# Patient Record
Sex: Female | Born: 1955
Health system: Southern US, Community
[De-identification: ages and names within clinical notes are randomized; demographics above are authoritative.]

## PROBLEM LIST (undated history)

## (undated) DIAGNOSIS — R200 Anesthesia of skin: Secondary | ICD-10-CM

## (undated) DIAGNOSIS — R531 Weakness: Secondary | ICD-10-CM

## (undated) DIAGNOSIS — R32 Unspecified urinary incontinence: Secondary | ICD-10-CM

## (undated) DIAGNOSIS — M545 Low back pain, unspecified: Secondary | ICD-10-CM

## (undated) DIAGNOSIS — G35 Multiple sclerosis: Secondary | ICD-10-CM

## (undated) DIAGNOSIS — H5440 Blindness, one eye, unspecified eye: Secondary | ICD-10-CM

## (undated) DIAGNOSIS — R0602 Shortness of breath: Secondary | ICD-10-CM

## (undated) DIAGNOSIS — E119 Type 2 diabetes mellitus without complications: Secondary | ICD-10-CM

## (undated) DIAGNOSIS — IMO0002 Reserved for concepts with insufficient information to code with codable children: Secondary | ICD-10-CM

## (undated) DIAGNOSIS — M1A9XX0 Chronic gout, unspecified, without tophus (tophi): Secondary | ICD-10-CM

## (undated) DIAGNOSIS — I10 Essential (primary) hypertension: Secondary | ICD-10-CM

## (undated) DIAGNOSIS — R0902 Hypoxemia: Secondary | ICD-10-CM

## (undated) DIAGNOSIS — Z960 Presence of urogenital implants: Secondary | ICD-10-CM

## (undated) DIAGNOSIS — I509 Heart failure, unspecified: Secondary | ICD-10-CM

## (undated) DIAGNOSIS — M109 Gout, unspecified: Secondary | ICD-10-CM

## (undated) DIAGNOSIS — M48061 Spinal stenosis, lumbar region without neurogenic claudication: Secondary | ICD-10-CM

## (undated) DIAGNOSIS — M4800 Spinal stenosis, site unspecified: Secondary | ICD-10-CM

## (undated) DIAGNOSIS — L89159 Pressure ulcer of sacral region, unspecified stage: Secondary | ICD-10-CM

## (undated) DIAGNOSIS — Z7409 Other reduced mobility: Secondary | ICD-10-CM

## (undated) DIAGNOSIS — G959 Disease of spinal cord, unspecified: Secondary | ICD-10-CM

## (undated) DIAGNOSIS — I872 Venous insufficiency (chronic) (peripheral): Secondary | ICD-10-CM

## (undated) DIAGNOSIS — M4802 Spinal stenosis, cervical region: Secondary | ICD-10-CM

## (undated) DIAGNOSIS — L03119 Cellulitis of unspecified part of limb: Secondary | ICD-10-CM

## (undated) DIAGNOSIS — Z7952 Long term (current) use of systemic steroids: Secondary | ICD-10-CM

## (undated) DIAGNOSIS — M47816 Spondylosis without myelopathy or radiculopathy, lumbar region: Secondary | ICD-10-CM

## (undated) DIAGNOSIS — R29898 Other symptoms and signs involving the musculoskeletal system: Secondary | ICD-10-CM

## (undated) HISTORY — DX: Blindness, one eye, unspecified eye: H54.40

## (undated) HISTORY — DX: Type 2 diabetes mellitus without complications: E11.9

## (undated) HISTORY — DX: Chronic gout, unspecified, without tophus (tophi): M1A.9XX0

## (undated) HISTORY — DX: Anesthesia of skin: R20.0

## (undated) HISTORY — DX: Other symptoms and signs involving the musculoskeletal system: R29.898

## (undated) HISTORY — DX: Spinal stenosis, site unspecified: M48.00

## (undated) HISTORY — DX: Pressure ulcer of sacral region, unspecified stage: L89.159

## (undated) HISTORY — DX: Long term (current) use of systemic steroids: Z79.52

## (undated) HISTORY — DX: Venous insufficiency (chronic) (peripheral): I87.2

## (undated) HISTORY — DX: Low back pain: M54.5

## (undated) HISTORY — DX: Other reduced mobility: Z74.09

## (undated) HISTORY — DX: Presence of urogenital implants: Z96.0

## (undated) HISTORY — DX: Low back pain, unspecified: M54.50

## (undated) HISTORY — DX: Multiple sclerosis: G35

## (undated) HISTORY — DX: Unspecified urinary incontinence: R32

## (undated) HISTORY — DX: Reserved for concepts with insufficient information to code with codable children: IMO0002

## (undated) HISTORY — DX: Cellulitis of unspecified part of limb: L03.119

## (undated) HISTORY — DX: Gout, unspecified: M10.9

## (undated) HISTORY — DX: Essential (primary) hypertension: I10

## (undated) HISTORY — DX: Weakness: R53.1

## (undated) HISTORY — DX: Spinal stenosis, cervical region: M48.02

## (undated) HISTORY — PX: CERVICAL DISC SURGERY: SHX588

## (undated) HISTORY — DX: Shortness of breath: R06.02

## (undated) HISTORY — DX: Disease of spinal cord, unspecified: G95.9

## (undated) HISTORY — DX: Spinal stenosis, lumbar region without neurogenic claudication: M48.061

## (undated) HISTORY — DX: Hypoxemia: R09.02

## (undated) HISTORY — PX: BACK SURGERY: SHX140

## (undated) HISTORY — DX: Spondylosis without myelopathy or radiculopathy, lumbar region: M47.816

## (undated) HISTORY — DX: Morbid (severe) obesity due to excess calories: E66.01

---

## 1999-05-08 ENCOUNTER — Emergency Department (HOSPITAL_COMMUNITY): Admission: EM | Admit: 1999-05-08 | Discharge: 1999-05-08 | Payer: Self-pay | Admitting: Emergency Medicine

## 2000-10-20 ENCOUNTER — Encounter: Admission: RE | Admit: 2000-10-20 | Discharge: 2000-10-20 | Payer: Self-pay | Admitting: Family Medicine

## 2000-10-20 ENCOUNTER — Encounter: Payer: Self-pay | Admitting: Family Medicine

## 2000-10-22 ENCOUNTER — Encounter: Admission: RE | Admit: 2000-10-22 | Discharge: 2000-10-22 | Payer: Self-pay | Admitting: Family Medicine

## 2000-10-22 ENCOUNTER — Encounter: Payer: Self-pay | Admitting: Family Medicine

## 2005-01-21 ENCOUNTER — Other Ambulatory Visit: Admission: RE | Admit: 2005-01-21 | Discharge: 2005-01-21 | Payer: Self-pay | Admitting: Family Medicine

## 2006-09-22 ENCOUNTER — Other Ambulatory Visit: Admission: RE | Admit: 2006-09-22 | Discharge: 2006-09-22 | Payer: Self-pay | Admitting: Family Medicine

## 2006-11-18 ENCOUNTER — Ambulatory Visit (HOSPITAL_COMMUNITY): Admission: RE | Admit: 2006-11-18 | Discharge: 2006-11-18 | Payer: Self-pay | Admitting: Family Medicine

## 2007-08-27 ENCOUNTER — Emergency Department (HOSPITAL_COMMUNITY): Admission: EM | Admit: 2007-08-27 | Discharge: 2007-08-27 | Payer: Self-pay | Admitting: Emergency Medicine

## 2008-05-31 ENCOUNTER — Other Ambulatory Visit: Admission: RE | Admit: 2008-05-31 | Discharge: 2008-05-31 | Payer: Self-pay | Admitting: Family Medicine

## 2008-11-10 ENCOUNTER — Ambulatory Visit (HOSPITAL_COMMUNITY): Admission: RE | Admit: 2008-11-10 | Discharge: 2008-11-10 | Payer: Self-pay | Admitting: Family Medicine

## 2009-05-15 ENCOUNTER — Ambulatory Visit (HOSPITAL_COMMUNITY): Admission: RE | Admit: 2009-05-15 | Discharge: 2009-05-15 | Payer: Self-pay | Admitting: Gastroenterology

## 2010-03-12 ENCOUNTER — Emergency Department (HOSPITAL_COMMUNITY)
Admission: EM | Admit: 2010-03-12 | Discharge: 2010-03-12 | Payer: Self-pay | Source: Home / Self Care | Admitting: Emergency Medicine

## 2010-05-23 ENCOUNTER — Ambulatory Visit (HOSPITAL_COMMUNITY)
Admission: RE | Admit: 2010-05-23 | Discharge: 2010-05-23 | Payer: Self-pay | Source: Home / Self Care | Attending: Neurology | Admitting: Neurology

## 2010-05-28 LAB — BUN: BUN: 17 mg/dL (ref 6–23)

## 2010-06-06 ENCOUNTER — Other Ambulatory Visit: Payer: Self-pay | Admitting: Neurology

## 2010-06-06 ENCOUNTER — Encounter: Payer: Self-pay | Admitting: Neurology

## 2010-06-06 DIAGNOSIS — G959 Disease of spinal cord, unspecified: Secondary | ICD-10-CM

## 2010-06-06 DIAGNOSIS — I10 Essential (primary) hypertension: Secondary | ICD-10-CM

## 2010-06-06 DIAGNOSIS — M4800 Spinal stenosis, site unspecified: Secondary | ICD-10-CM

## 2010-06-15 ENCOUNTER — Ambulatory Visit
Admission: RE | Admit: 2010-06-15 | Discharge: 2010-06-15 | Disposition: A | Discharge status: Home / Self Care | Payer: Commercial Managed Care - PPO | Source: Ambulatory Visit | Attending: Neurology | Admitting: Neurology

## 2010-06-15 DIAGNOSIS — G959 Disease of spinal cord, unspecified: Secondary | ICD-10-CM

## 2010-06-15 DIAGNOSIS — M4800 Spinal stenosis, site unspecified: Secondary | ICD-10-CM

## 2010-06-15 DIAGNOSIS — I10 Essential (primary) hypertension: Secondary | ICD-10-CM

## 2010-09-11 ENCOUNTER — Telehealth: Payer: Self-pay

## 2010-09-11 NOTE — Telephone Encounter (Signed)
Records received from [facility] gave to Lela  09/11/10/km

## 2010-09-13 ENCOUNTER — Ambulatory Visit: Payer: 59 | Attending: Neurology | Admitting: Physical Therapy

## 2010-09-13 ENCOUNTER — Other Ambulatory Visit: Payer: Self-pay | Admitting: Neurology

## 2010-09-13 DIAGNOSIS — IMO0001 Reserved for inherently not codable concepts without codable children: Secondary | ICD-10-CM | POA: Insufficient documentation

## 2010-09-13 DIAGNOSIS — M6281 Muscle weakness (generalized): Secondary | ICD-10-CM | POA: Insufficient documentation

## 2010-09-13 DIAGNOSIS — L02419 Cutaneous abscess of limb, unspecified: Secondary | ICD-10-CM

## 2010-09-13 DIAGNOSIS — R269 Unspecified abnormalities of gait and mobility: Secondary | ICD-10-CM | POA: Insufficient documentation

## 2010-09-14 ENCOUNTER — Other Ambulatory Visit: Payer: Self-pay | Admitting: *Deleted

## 2010-09-14 ENCOUNTER — Encounter (INDEPENDENT_AMBULATORY_CARE_PROVIDER_SITE_OTHER): Payer: 59 | Admitting: *Deleted

## 2010-09-14 ENCOUNTER — Other Ambulatory Visit (HOSPITAL_COMMUNITY): Payer: Self-pay | Admitting: Neurology

## 2010-09-14 DIAGNOSIS — M7989 Other specified soft tissue disorders: Secondary | ICD-10-CM

## 2010-09-14 DIAGNOSIS — G35 Multiple sclerosis: Secondary | ICD-10-CM

## 2010-09-14 DIAGNOSIS — L03119 Cellulitis of unspecified part of limb: Secondary | ICD-10-CM

## 2010-09-17 ENCOUNTER — Encounter: Payer: Self-pay | Admitting: Neurology

## 2010-09-19 ENCOUNTER — Ambulatory Visit (HOSPITAL_COMMUNITY)
Admission: RE | Admit: 2010-09-19 | Discharge: 2010-09-19 | Disposition: A | Discharge status: Home / Self Care | Payer: 59 | Source: Ambulatory Visit | Attending: Neurology | Admitting: Neurology

## 2010-09-19 DIAGNOSIS — M519 Unspecified thoracic, thoracolumbar and lumbosacral intervertebral disc disorder: Secondary | ICD-10-CM | POA: Insufficient documentation

## 2010-09-19 DIAGNOSIS — M48061 Spinal stenosis, lumbar region without neurogenic claudication: Secondary | ICD-10-CM | POA: Insufficient documentation

## 2010-09-19 DIAGNOSIS — G35 Multiple sclerosis: Secondary | ICD-10-CM

## 2010-09-19 MED ORDER — GADOBENATE DIMEGLUMINE 529 MG/ML IV SOLN
20.0000 mL | Freq: Once | INTRAVENOUS | Status: AC
  Administered 2010-09-19: 20 mL via INTRAVENOUS

## 2010-09-24 ENCOUNTER — Ambulatory Visit: Payer: 59 | Admitting: Physical Therapy

## 2010-09-26 ENCOUNTER — Ambulatory Visit: Payer: 59 | Admitting: Physical Therapy

## 2010-10-02 ENCOUNTER — Ambulatory Visit: Payer: 59 | Admitting: Physical Therapy

## 2010-10-04 ENCOUNTER — Ambulatory Visit: Payer: 59 | Admitting: Physical Therapy

## 2010-10-08 ENCOUNTER — Ambulatory Visit: Payer: 59 | Attending: Neurology | Admitting: Physical Therapy

## 2010-10-08 DIAGNOSIS — R269 Unspecified abnormalities of gait and mobility: Secondary | ICD-10-CM | POA: Insufficient documentation

## 2010-10-08 DIAGNOSIS — IMO0001 Reserved for inherently not codable concepts without codable children: Secondary | ICD-10-CM | POA: Insufficient documentation

## 2010-10-08 DIAGNOSIS — M6281 Muscle weakness (generalized): Secondary | ICD-10-CM | POA: Insufficient documentation

## 2010-10-10 ENCOUNTER — Ambulatory Visit: Payer: 59 | Admitting: Physical Therapy

## 2010-10-15 ENCOUNTER — Ambulatory Visit: Payer: 59 | Admitting: Physical Therapy

## 2010-10-17 ENCOUNTER — Ambulatory Visit: Payer: 59 | Admitting: Physical Therapy

## 2010-10-19 ENCOUNTER — Ambulatory Visit: Payer: 59 | Admitting: Physical Therapy

## 2010-10-22 ENCOUNTER — Ambulatory Visit: Payer: 59 | Admitting: Physical Therapy

## 2010-10-24 ENCOUNTER — Ambulatory Visit: Payer: 59 | Admitting: Physical Therapy

## 2010-11-23 ENCOUNTER — Ambulatory Visit: Payer: 59 | Admitting: Family Medicine

## 2010-12-07 ENCOUNTER — Ambulatory Visit (INDEPENDENT_AMBULATORY_CARE_PROVIDER_SITE_OTHER): Payer: 59 | Admitting: Family Medicine

## 2010-12-07 ENCOUNTER — Encounter: Payer: Self-pay | Admitting: Family Medicine

## 2010-12-07 VITALS — BP 126/82 | HR 82 | Wt 263.0 lb

## 2010-12-07 DIAGNOSIS — G35 Multiple sclerosis: Secondary | ICD-10-CM | POA: Insufficient documentation

## 2010-12-07 DIAGNOSIS — M1A9XX0 Chronic gout, unspecified, without tophus (tophi): Secondary | ICD-10-CM

## 2010-12-07 DIAGNOSIS — Z09 Encounter for follow-up examination after completed treatment for conditions other than malignant neoplasm: Secondary | ICD-10-CM

## 2010-12-07 DIAGNOSIS — H5440 Blindness, one eye, unspecified eye: Secondary | ICD-10-CM

## 2010-12-07 DIAGNOSIS — G35D Multiple sclerosis, unspecified: Secondary | ICD-10-CM

## 2010-12-07 DIAGNOSIS — M109 Gout, unspecified: Secondary | ICD-10-CM

## 2010-12-07 DIAGNOSIS — M48061 Spinal stenosis, lumbar region without neurogenic claudication: Secondary | ICD-10-CM

## 2010-12-07 DIAGNOSIS — N63 Unspecified lump in unspecified breast: Secondary | ICD-10-CM

## 2010-12-07 DIAGNOSIS — N950 Postmenopausal bleeding: Secondary | ICD-10-CM

## 2010-12-07 DIAGNOSIS — Z1239 Encounter for other screening for malignant neoplasm of breast: Secondary | ICD-10-CM

## 2010-12-07 DIAGNOSIS — I10 Essential (primary) hypertension: Secondary | ICD-10-CM

## 2010-12-07 DIAGNOSIS — E669 Obesity, unspecified: Secondary | ICD-10-CM

## 2010-12-07 DIAGNOSIS — N6320 Unspecified lump in the left breast, unspecified quadrant: Secondary | ICD-10-CM

## 2010-12-07 HISTORY — DX: Spinal stenosis, lumbar region without neurogenic claudication: M48.061

## 2010-12-07 HISTORY — DX: Morbid (severe) obesity due to excess calories: E66.01

## 2010-12-07 HISTORY — DX: Multiple sclerosis: G35

## 2010-12-07 HISTORY — DX: Blindness, one eye, unspecified eye: H54.40

## 2010-12-07 HISTORY — DX: Chronic gout, unspecified, without tophus (tophi): M1A.9XX0

## 2010-12-07 MED ORDER — DIAZEPAM 5 MG PO TABS: 2.5000 mg | ORAL_TABLET | Freq: Four times a day (QID) | ORAL | Status: DC | PRN

## 2010-12-07 NOTE — Assessment & Plan Note (Signed)
Result of MS flair 05/2010

## 2010-12-07 NOTE — Assessment & Plan Note (Signed)
No current symptoms and not on any meds

## 2010-12-07 NOTE — Assessment & Plan Note (Signed)
Well controled on current meds 

## 2010-12-07 NOTE — Assessment & Plan Note (Signed)
Last mammo 2002

## 2010-12-07 NOTE — Assessment & Plan Note (Signed)
Currently stable.  Managed by Dr. Sandria Manly

## 2010-12-07 NOTE — Patient Instructions (Signed)
Check when last tetanus Consider water aerobics for exercise. Get ultrasound - you may need more testing (endometrial biopsy) Get mammogram done.

## 2010-12-07 NOTE — Progress Notes (Signed)
  Subjective:    Patient ID: Barbara [name], female    DOB: [birth year], 55 y.o.   MRN: [id number]  HPI Establishing care: 2012 has been a bad year.  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  Now managed by Dr. Sandria Manly and stable.  Husband died suddenly (not expected but he did have MANY chronic medical problems.) in June 2012.     Seemed to go through menopause more than one year ago.  Has had two periods - both times when take steroids for flair of MS.   Colonoscopy 05/2008 normal Up to date on tetanus - receives at employee health Last Pap ~10/2009 history of abnormal pap but these were greater than 10 years ago.  All normal since.    Review of Systems Chronic Lt leg weakness.  Blind Rt eye.  No other complaints.    Objective:   Physical Exam VS noted General upbeat and handling grief well HEENT nl Lungs clear  Cardiac RRR       Assessment & Plan:

## 2010-12-07 NOTE — Assessment & Plan Note (Signed)
Most concerning acute problem.  Will check endometrial stripe on ultrasound.  Likely needs endometrial biopsy.

## 2010-12-11 NOTE — Progress Notes (Signed)
Addended by: Deno Etienne on: 12/11/2010 12:02 PM   Modules accepted: Orders

## 2010-12-27 ENCOUNTER — Telehealth: Payer: Self-pay | Admitting: Family Medicine

## 2010-12-27 NOTE — Telephone Encounter (Signed)
[name] needs to have the date of the last Tetanus shot, and that was 09/09/07.

## 2011-01-02 ENCOUNTER — Inpatient Hospital Stay (INDEPENDENT_AMBULATORY_CARE_PROVIDER_SITE_OTHER)
Admission: RE | Admit: 2011-01-02 | Discharge: 2011-01-02 | Disposition: A | Discharge status: Home / Self Care | Payer: 59 | Source: Ambulatory Visit | Attending: Emergency Medicine | Admitting: Emergency Medicine

## 2011-01-02 DIAGNOSIS — H109 Unspecified conjunctivitis: Secondary | ICD-10-CM

## 2011-01-03 ENCOUNTER — Ambulatory Visit: Payer: 59 | Admitting: Family Medicine

## 2011-02-12 ENCOUNTER — Other Ambulatory Visit: Payer: Self-pay | Admitting: Family Medicine

## 2011-02-12 DIAGNOSIS — I10 Essential (primary) hypertension: Secondary | ICD-10-CM

## 2011-02-12 MED ORDER — LISINOPRIL-HYDROCHLOROTHIAZIDE 10-12.5 MG PO TABS: 1.0000 | ORAL_TABLET | Freq: Every day | ORAL | Status: DC

## 2011-02-12 NOTE — Assessment & Plan Note (Signed)
Refilled based on fax request 

## 2011-03-11 ENCOUNTER — Ambulatory Visit (INDEPENDENT_AMBULATORY_CARE_PROVIDER_SITE_OTHER): Payer: Self-pay | Admitting: Pharmacist

## 2011-03-11 ENCOUNTER — Encounter: Payer: Self-pay | Admitting: Pharmacist

## 2011-03-11 VITALS — BP 127/84 | HR 77 | Ht 65.0 in | Wt 264.3 lb

## 2011-03-11 DIAGNOSIS — G35 Multiple sclerosis: Secondary | ICD-10-CM

## 2011-03-11 NOTE — Progress Notes (Signed)
  Subjective:    Patient ID: Barbara [name], female    DOB: Nov 06, 1955, 55 y.o.   MRN: [id number]  HPI - Patient arrived for med review.  Came in with assistance from rolling walker.  She appeared pleasant and expressed that it has been a tough year but she is confident in being able to handle things day to day.  Currently taking lisinopril/HCTZ 10-12.5 (blood pressure currently controlled on this), diazepam 5mg , and tramadol 50mg  (taking 1 a day which is working to control pain).  Patient also expressed that she is unsure of the source of the pain, either being from MS or spinal stenosis.  Plans to start Copaxone for MS pending November 30th appointment.  Patient previously tried on betaseron but expressed that it caused her leg to feel weaker.      Review of Systems     Objective:   Physical Exam  BP 127/84  Pulse 77  Ht 5\' 5"  (1.651 m)  Wt 264 lb 4.8 oz (119.886 kg)  BMI 43.98 kg/m2       Assessment & Plan:  No changes from previous, excepting plan to start Copaxone for MS pending November 30th appointment with Dr. Sandria Manly.  Patient seen with Marijo Conception, PharmD Candidate and Maudry Mayhew, Pharmacy Resident

## 2011-03-11 NOTE — Progress Notes (Signed)
  Subjective:    Patient ID: Barbara [name], female    DOB: 1956-02-22, 55 y.o.   MRN: [id number]  HPI  Reviewed and agree with Dr. Macky Lower management.    Review of Systems     Objective:   Physical Exam        Assessment & Plan:

## 2011-03-11 NOTE — Assessment & Plan Note (Signed)
No changes from previous, excepting plan to start Copaxone for MS pending November 30th appointment with Dr. Sandria Manly.  Patient seen with Marijo Conception, PharmD Candidate and Maudry Mayhew, Pharmacy Resident

## 2011-03-22 ENCOUNTER — Ambulatory Visit: Payer: 59 | Admitting: Family Medicine

## 2011-04-03 ENCOUNTER — Ambulatory Visit: Payer: 59 | Admitting: Family Medicine

## 2011-04-08 ENCOUNTER — Encounter: Payer: Self-pay | Admitting: Family Medicine

## 2011-04-08 NOTE — Progress Notes (Signed)
  Subjective:    Patient ID: Barbara [name], female    DOB: 1956/01/16, 55 y.o.   MRN: [id number]  HPI Neuro consult will be scanned.  States doing better on copaxone.   Review of Systems     Objective:   Physical Exam        Assessment & Plan:

## 2011-04-09 ENCOUNTER — Ambulatory Visit: Payer: 59 | Admitting: Family Medicine

## 2011-06-03 ENCOUNTER — Encounter: Payer: Self-pay | Admitting: Family Medicine

## 2011-06-03 DIAGNOSIS — M4802 Spinal stenosis, cervical region: Secondary | ICD-10-CM | POA: Insufficient documentation

## 2011-06-03 DIAGNOSIS — M48061 Spinal stenosis, lumbar region without neurogenic claudication: Secondary | ICD-10-CM

## 2011-06-03 HISTORY — DX: Spinal stenosis, cervical region: M48.02

## 2011-06-03 NOTE — Progress Notes (Signed)
  Subjective:    Patient ID: Barbara [name], female    DOB: [birth year], 56 y.o.   MRN: [id number]  HPI  Will scan consult note from [facility] who plans first cervical then lumbar spinal surg.  I will ask for case management services for this unfortunate young woman.      Review of Systems     Objective:   Physical Exam        Assessment & Plan:

## 2011-06-11 ENCOUNTER — Other Ambulatory Visit: Payer: Self-pay | Admitting: Family Medicine

## 2011-06-11 DIAGNOSIS — G35 Multiple sclerosis: Secondary | ICD-10-CM

## 2011-06-11 MED ORDER — DIAZEPAM 5 MG PO TABS: 2.5000 mg | ORAL_TABLET | Freq: Four times a day (QID) | ORAL | Status: DC | PRN

## 2011-06-11 NOTE — Telephone Encounter (Signed)
Refilled upon fax request 

## 2011-06-26 ENCOUNTER — Encounter: Payer: Self-pay | Admitting: Family Medicine

## 2011-08-07 DIAGNOSIS — G959 Disease of spinal cord, unspecified: Secondary | ICD-10-CM

## 2011-08-07 HISTORY — DX: Disease of spinal cord, unspecified: G95.9

## 2011-11-01 ENCOUNTER — Encounter: Payer: Self-pay | Admitting: Family Medicine

## 2011-11-01 ENCOUNTER — Ambulatory Visit (INDEPENDENT_AMBULATORY_CARE_PROVIDER_SITE_OTHER): Payer: 59 | Admitting: Family Medicine

## 2011-11-01 VITALS — BP 136/84 | HR 86 | Temp 97.9°F | Ht 65.0 in | Wt 257.9 lb

## 2011-11-01 DIAGNOSIS — M48061 Spinal stenosis, lumbar region without neurogenic claudication: Secondary | ICD-10-CM

## 2011-11-01 DIAGNOSIS — I10 Essential (primary) hypertension: Secondary | ICD-10-CM

## 2011-11-01 DIAGNOSIS — G35 Multiple sclerosis: Secondary | ICD-10-CM

## 2011-11-01 DIAGNOSIS — M4802 Spinal stenosis, cervical region: Secondary | ICD-10-CM

## 2011-11-01 DIAGNOSIS — E669 Obesity, unspecified: Secondary | ICD-10-CM

## 2011-11-01 LAB — LIPID PANEL
Cholesterol: 154 mg/dL (ref 0–200)
LDL Cholesterol: 75 mg/dL (ref 0–99)
Triglycerides: 248 mg/dL — ABNORMAL HIGH (ref <150)

## 2011-11-01 LAB — COMPLETE METABOLIC PANEL WITH GFR
BUN: 17 mg/dL (ref 6–23)
CO2: 28 mEq/L (ref 19–32)
Calcium: 9.3 mg/dL (ref 8.4–10.5)
Chloride: 105 mEq/L (ref 96–112)
Creat: 0.83 mg/dL (ref 0.50–1.10)
GFR, Est African American: >89 mL/min

## 2011-11-01 NOTE — Patient Instructions (Addendum)
You have chronic venous insufficiency of your leg.   Things you can do are elevate your leg during day, watch salt/sodium intake and keep the skin moist with Eucerin lotion. Watch your leg - it is easily infected.  If the redness starts getting larger, call me for antibiotics. I will call with results in 2 weeks.   Remember to get your mammogram. You will be due for a Pap smear next year.

## 2011-11-01 NOTE — Assessment & Plan Note (Signed)
Stable and follows closely with multiple readings.

## 2011-11-01 NOTE — Assessment & Plan Note (Signed)
Symptoms markedly improved since surgery.

## 2011-11-01 NOTE — Progress Notes (Signed)
  Subjective:    Patient ID: Barbara [name], female    DOB: 30-Sep-1955, 56 y.o.   MRN: [id number]  HPI  Multiple issues.  Chronic redness and swelling of lower Left leg - the one most affected by her multiple sclerosis.  Today is a typical day for her leg. Multiple sclerosis and neck surg Doing well since neck surgery.  Unable to exercise much due to leg weakness.  Tried water aerobics but felt unsafe getting in and out of pool.  Wants Vit D level - she has read that MS patients suffer more with low Vit D. Husband died about one year ago.  Coping well.   Behind on health maint.  Needs a mammogram. Pap due next year.  Needs lipids. Obese, but activity limited due to MS    Review of Systems     Objective:   Physical ExamNeck supple, well healing ant scar from neck surg Lungs clear Cardiac RRR without m or g Ext Lt leg 1+ edema and skin changes typical for chronic venous insufficiency.        Assessment & Plan:

## 2011-11-01 NOTE — Assessment & Plan Note (Signed)
Unchanged

## 2011-11-11 ENCOUNTER — Encounter: Payer: Self-pay | Admitting: Family Medicine

## 2011-11-27 ENCOUNTER — Other Ambulatory Visit (HOSPITAL_COMMUNITY): Payer: Self-pay | Admitting: Neurology

## 2011-11-27 DIAGNOSIS — G35D Multiple sclerosis, unspecified: Secondary | ICD-10-CM

## 2011-11-27 DIAGNOSIS — G35 Multiple sclerosis: Secondary | ICD-10-CM

## 2011-11-30 ENCOUNTER — Ambulatory Visit (HOSPITAL_COMMUNITY)
Admission: RE | Admit: 2011-11-30 | Discharge: 2011-11-30 | Disposition: A | Discharge status: Home / Self Care | Payer: 59 | Source: Ambulatory Visit | Attending: Neurology | Admitting: Neurology

## 2011-11-30 DIAGNOSIS — G35 Multiple sclerosis: Secondary | ICD-10-CM

## 2011-12-02 ENCOUNTER — Other Ambulatory Visit: Payer: Self-pay | Admitting: Family Medicine

## 2011-12-02 MED ORDER — COLCHICINE 0.6 MG PO TABS: 0.6000 mg | ORAL_TABLET | Freq: Every day | ORAL | Status: DC

## 2011-12-02 NOTE — Telephone Encounter (Signed)
Pt is having a gout flair up and ans is asking for Dr Leveda Anna to call in Colcrys 0.6mg   OP Pharm

## 2011-12-02 NOTE — Telephone Encounter (Signed)
Having gout flair.  Needs refill of colcrys

## 2011-12-02 NOTE — Telephone Encounter (Addendum)
Will forward to [name]. Spoke with patient  states left great toe is swollen , red and sore. Started Sat. She has not had a flare up since she has been seeing [name] but he knows she has a problem with gout  she states.

## 2012-01-01 ENCOUNTER — Other Ambulatory Visit: Payer: Self-pay | Admitting: Family Medicine

## 2012-01-01 DIAGNOSIS — G35 Multiple sclerosis: Secondary | ICD-10-CM

## 2012-01-01 MED ORDER — DIAZEPAM 5 MG PO TABS: 2.5000 mg | ORAL_TABLET | Freq: Four times a day (QID) | ORAL | Status: DC | PRN

## 2012-01-01 NOTE — Telephone Encounter (Signed)
Refilled based on fax request 

## 2012-02-06 ENCOUNTER — Telehealth: Payer: Self-pay | Admitting: Family Medicine

## 2012-02-06 DIAGNOSIS — I10 Essential (primary) hypertension: Secondary | ICD-10-CM

## 2012-02-06 NOTE — Telephone Encounter (Signed)
Pt is seeing an increase in her gout over the last few months and she had talked with her neurosurgeon yesterday to see what he thought about it.  He thinks that since she is putting more weight on her rt (good) leg that that might be the reason for this increase.  Wants to know if Dr Leveda Anna wants to put her back on the maintenance dose to help with this or to know of his opinion. Is at home today and tomorrow will be back at work

## 2012-02-06 NOTE — Telephone Encounter (Signed)
Will forward to Dr Hensel 

## 2012-02-07 NOTE — Telephone Encounter (Signed)
LVMOM for patient to call back.

## 2012-02-07 NOTE — Telephone Encounter (Signed)
Dear [facility] [employer] Plz tell then Dr Leveda Anna is out of office today and since this is med mgmt and not acute, I will leave for him to decide on Monday when he returns. Please forward back to his box THANKS! Denny Levy

## 2012-02-10 NOTE — Telephone Encounter (Signed)
Patient called back and gave message to her

## 2012-02-11 MED ORDER — LISINOPRIL 20 MG PO TABS: 20.0000 mg | ORAL_TABLET | Freq: Every day | ORAL | Status: DC

## 2012-02-11 NOTE — Assessment & Plan Note (Signed)
DC HCTZ and check uric acid

## 2012-02-11 NOTE — Addendum Note (Signed)
Addended by: Tivis Ringer on: 02/11/2012 02:00 PM   Modules accepted: Orders

## 2012-02-11 NOTE — Assessment & Plan Note (Signed)
DC HCTZ, increase lisinopril and check BMP

## 2012-03-13 ENCOUNTER — Ambulatory Visit (INDEPENDENT_AMBULATORY_CARE_PROVIDER_SITE_OTHER): Payer: Self-pay | Admitting: Pharmacist

## 2012-03-13 ENCOUNTER — Encounter: Payer: Self-pay | Admitting: Pharmacist

## 2012-03-13 VITALS — BP 139/85 | HR 73 | Ht 65.5 in | Wt 262.2 lb

## 2012-03-13 DIAGNOSIS — G35 Multiple sclerosis: Secondary | ICD-10-CM

## 2012-03-13 NOTE — Progress Notes (Signed)
  Subjective:    Patient ID: [name], female    DOB: 18-Jul-1955, 56 y.o.   MRN: [id number]  HPI HPI - Patient arrived for med review. Came in with assistance from rolling walker. Reports seeing [name] as primary care provider, Dr. Avie Echevaria as neurologist, Dr. Jacklyn Shell at [facility] as neurosurgeon.  Reports being diagnosed with MS Jan 2012. Patient states pending MRI of upper spine , anticipates changing therapy form Copaxone to Tysabri as her lower spine MRI showed progression of disease.   Review of Systems     Objective:   Physical Exam        Assessment & Plan:  Following medication review, no suggestions for change. Anticipate change to Tysabri per Dr.Love in near future. Complete medication list provided to patient.  Total time in face to face medication review: 10 minutes.  Patient seen with: Maryanna Shape PharmD, Pharmacy Resident and Dr. Marikay Alar MD

## 2012-03-13 NOTE — Patient Instructions (Addendum)
Thanks for coming in.

## 2012-03-13 NOTE — Assessment & Plan Note (Signed)
Following medication review, no suggestions for change. Anticipate change to Tysabri per Dr.Love in near future. Complete medication list provided to patient.  Total time in face to face medication review: 10 minutes.  Patient seen with: Maryanna Shape PharmD, Pharmacy Resident and Dr. Marikay Alar MD

## 2012-03-16 NOTE — Progress Notes (Signed)
Patient ID: Barbara [name], female   DOB: [birth year], 56 y.o.   MRN: [id number] Reviewed and agree with Dr. Macky Lower management and documentation.

## 2012-05-13 ENCOUNTER — Encounter: Payer: Self-pay | Admitting: Family Medicine

## 2012-05-13 ENCOUNTER — Ambulatory Visit (INDEPENDENT_AMBULATORY_CARE_PROVIDER_SITE_OTHER): Payer: 59 | Admitting: Family Medicine

## 2012-05-13 VITALS — BP 110/82 | HR 73 | Temp 98.0°F | Wt 262.0 lb

## 2012-05-13 DIAGNOSIS — M1A9XX0 Chronic gout, unspecified, without tophus (tophi): Secondary | ICD-10-CM

## 2012-05-13 DIAGNOSIS — M1A00X0 Idiopathic chronic gout, unspecified site, without tophus (tophi): Secondary | ICD-10-CM

## 2012-05-13 DIAGNOSIS — M25579 Pain in unspecified ankle and joints of unspecified foot: Secondary | ICD-10-CM

## 2012-05-13 DIAGNOSIS — E669 Obesity, unspecified: Secondary | ICD-10-CM

## 2012-05-13 LAB — URIC ACID: Uric Acid, Serum: 7.9 mg/dL — ABNORMAL HIGH (ref 2.4–7.0)

## 2012-05-13 NOTE — Patient Instructions (Signed)
As we discussed

## 2012-05-15 NOTE — Progress Notes (Signed)
  Subjective:    Patient ID: Barbara [name], female    DOB: [birth year], 56 y.o.   MRN: [id number]  HPI Doing well.  Main reason for visit was to discuss recurrent right ankle pain.  Happens on lateral side.  Gait abnormal and uses walker due to MS and cervical spine disease.  Also had questions about disability and about weight.    Review of Systems     Objective:   Physical Exam Ankle is normal on exam today.  When she has pain, it is on the lateral aspect of the ankle.  Checking her gait, she rolls her ankle internally putting undue stress on lateral ligaments.        Assessment & Plan:

## 2012-05-15 NOTE — Assessment & Plan Note (Signed)
Discussed possibility of bariatric surgery.  Significant wt reduction would help in her struggle to maintain her independence and mobility.

## 2012-05-15 NOTE — Assessment & Plan Note (Signed)
Unclear whether ankle pain is gout or mechanical.  Will check uric acid.  Treatment options are either allopurinol or referral to sports med for inserts.

## 2012-05-18 DIAGNOSIS — M25579 Pain in unspecified ankle and joints of unspecified foot: Secondary | ICD-10-CM | POA: Insufficient documentation

## 2012-05-18 NOTE — Assessment & Plan Note (Signed)
With uric acid of 7.9 doubt gout.  Will refer to sports med for possible inserts for her internal rotation of ankle.

## 2012-05-18 NOTE — Addendum Note (Signed)
Addended by: Tivis Ringer on: 05/18/2012 10:03 AM   Modules accepted: Orders

## 2012-06-30 ENCOUNTER — Other Ambulatory Visit: Payer: Self-pay | Admitting: Family Medicine

## 2012-06-30 DIAGNOSIS — G35 Multiple sclerosis: Secondary | ICD-10-CM

## 2012-06-30 MED ORDER — DIAZEPAM 5 MG PO TABS: 2.5000 mg | ORAL_TABLET | Freq: Four times a day (QID) | ORAL | Status: DC | PRN

## 2012-06-30 NOTE — Telephone Encounter (Signed)
Refilled diazepam in response to fax request and gave 3 month supply.

## 2012-07-01 ENCOUNTER — Telehealth: Payer: Self-pay | Admitting: *Deleted

## 2012-07-01 NOTE — Telephone Encounter (Signed)
Pharmacy calling about Barbara [name]'s diazepam prescription.  Patient was requesting a 90 day supply which would be #180 tablets and the prescription they received is just for #90 tablets which is a 45 day supply.  Just trying to verify if #90 tablets was all [name] wanted to prescribe at this time or did he mean to write the prescription for #180.  Will forward to [name] for review.  Ileana Ladd

## 2012-07-02 NOTE — Telephone Encounter (Signed)
Called pharmacy and let them know that Barbara [name] only uses 30 of this prn medication per month so the #90 is a 90 day supply.

## 2012-08-07 ENCOUNTER — Other Ambulatory Visit: Payer: Self-pay

## 2012-08-07 MED ORDER — TRAMADOL HCL 50 MG PO TABS: 50.0000 mg | ORAL_TABLET | Freq: Three times a day (TID) | ORAL | Status: DC | PRN

## 2012-08-07 NOTE — Telephone Encounter (Signed)
Patient requests 90 day rx.  Former [facility] patient.  Dr Eusebio Me

## 2012-08-18 ENCOUNTER — Other Ambulatory Visit (HOSPITAL_COMMUNITY): Payer: Self-pay | Admitting: Neurosurgery

## 2012-08-18 DIAGNOSIS — IMO0002 Reserved for concepts with insufficient information to code with codable children: Secondary | ICD-10-CM

## 2012-08-20 ENCOUNTER — Ambulatory Visit (HOSPITAL_COMMUNITY)
Admission: RE | Admit: 2012-08-20 | Discharge: 2012-08-20 | Disposition: A | Discharge status: Home / Self Care | Payer: 59 | Source: Ambulatory Visit | Attending: Neurosurgery | Admitting: Neurosurgery

## 2012-08-20 DIAGNOSIS — M51379 Other intervertebral disc degeneration, lumbosacral region without mention of lumbar back pain or lower extremity pain: Secondary | ICD-10-CM | POA: Insufficient documentation

## 2012-08-20 DIAGNOSIS — IMO0002 Reserved for concepts with insufficient information to code with codable children: Secondary | ICD-10-CM

## 2012-08-20 DIAGNOSIS — M5137 Other intervertebral disc degeneration, lumbosacral region: Secondary | ICD-10-CM | POA: Insufficient documentation

## 2012-08-20 DIAGNOSIS — G35 Multiple sclerosis: Secondary | ICD-10-CM | POA: Insufficient documentation

## 2012-08-20 DIAGNOSIS — M4120 Other idiopathic scoliosis, site unspecified: Secondary | ICD-10-CM | POA: Insufficient documentation

## 2012-08-20 DIAGNOSIS — M5126 Other intervertebral disc displacement, lumbar region: Secondary | ICD-10-CM | POA: Insufficient documentation

## 2012-10-16 ENCOUNTER — Institutional Professional Consult (permissible substitution): Payer: Self-pay | Admitting: Neurology

## 2012-10-20 ENCOUNTER — Other Ambulatory Visit: Payer: Self-pay | Admitting: Neurology

## 2012-10-20 ENCOUNTER — Telehealth: Payer: Self-pay | Admitting: Family Medicine

## 2012-10-20 NOTE — Telephone Encounter (Signed)
[facility] called and pt has not been reassigned. Message left for nurse.Requesting appointment as soon as possible. Wyatt Haste, RN-BSN

## 2012-10-20 NOTE — Telephone Encounter (Signed)
Call returned to pt - states that she cannot get here sooner due to lack of transportation. Encouraged to call and make earlier appointment if needed. I will call [facility] until I talk to someone regarding appointment and call her back. Pt verbalized understanding. Wyatt Haste, RN-BSN

## 2012-10-20 NOTE — Telephone Encounter (Signed)
Pt states that her neurologist has retired and she has been unable to get into see a new one. Pt reports that she has had MS flare up - and needs prescription for prednisone - has appointment here next Friday but needs to be seen sooner than that. Will attempt to get pt appointment at [facility] neuro or earlier here in the clinic than next Friday. Wyatt Haste, RN-BSN

## 2012-10-20 NOTE — Telephone Encounter (Signed)
Patient recently had back surgery and was in nursing home for 4 weeks. States back surgery triggered a MS episode. She feels she is almost over the MS episode and usually takes Prednisone at the end of episodes. Would like to know if Hensel would prescribe the Prednisone. Patient was a patient at [facility] (Dr. Sandria Manly) but cannot get a call back to get an appt with another neurologist. Pls call patient.

## 2012-10-21 ENCOUNTER — Telehealth: Payer: Self-pay | Admitting: *Deleted

## 2012-10-21 NOTE — Telephone Encounter (Signed)
If this patient had a sleep study and is needing a follow up, please schedule . If this is a new sleep consult  Without preceeding sleep study , please schedule through [facility] in the sleep lab with either of our sleep doctors. Marland Kitchen

## 2012-10-21 NOTE — Telephone Encounter (Signed)
After talking with Lanora Manis with Dr. Cyndia Skeeters office.   Pt had appt with Dr. Vickey Huger last Friday 10-16-12, had to cancel was in rehab after back surgery.  Has not seen Dr. Marjory Lies before.   Will send to Dr. Vickey Huger for sleep consult, but also see for MS exacerbation.  To call Lanora Manis back [phone].

## 2012-10-21 NOTE — Telephone Encounter (Signed)
Barbara [name] calling for pt who has been seen at [facility].   Pt having a MS exacerbation of her MS.  Former Dr. Sandria Manly pt.  Need appt. Last seen 07-02-12 with Dr. Sandria Manly, assigned to Dr. Marjory Lies.

## 2012-10-21 NOTE — Telephone Encounter (Signed)
[facility] returning call - will try to get pt in with Dr. Gerrianne Scale? And will return call after checking with MD. Wyatt Haste, RN-BSN

## 2012-10-21 NOTE — Telephone Encounter (Signed)
Former [facility] patient assigned to Dr Penumalli. 

## 2012-10-22 NOTE — Telephone Encounter (Signed)
This pt is assigned to you Dr. Marjory Lies . Please advise.

## 2012-10-22 NOTE — Telephone Encounter (Signed)
If this visit is for MS exacerbation, the patient s needs to see her primary neurologist and not sleep MD.

## 2012-10-23 NOTE — Telephone Encounter (Signed)
I called and LMVM for Lanora Manis, with Dr. Cyndia Diver office re: appt for Monday.   Will call pt.  I called pt and she cannot make Monday appt 10-26-12.  Just got out of rehab from back surgery.  She asked about Friday after seeing [name].   Dr.Penumalli out, no other MD available.  She would speak with [name] on Friday about what to do.  She stated her exacerbation started in hospital , in rehab.  She would be over it by the time she was able to get in for an appt.

## 2012-10-26 ENCOUNTER — Ambulatory Visit: Payer: Self-pay | Admitting: Diagnostic Neuroimaging

## 2012-10-27 NOTE — Telephone Encounter (Signed)
Called and verified that she does not need a prescription for prednisone at this time.

## 2012-10-28 ENCOUNTER — Telehealth: Payer: Self-pay | Admitting: Diagnostic Neuroimaging

## 2012-10-30 ENCOUNTER — Ambulatory Visit: Payer: 59 | Admitting: Family Medicine

## 2012-11-04 ENCOUNTER — Encounter: Payer: Self-pay | Admitting: Family Medicine

## 2012-11-04 ENCOUNTER — Ambulatory Visit (INDEPENDENT_AMBULATORY_CARE_PROVIDER_SITE_OTHER): Payer: 59 | Admitting: Family Medicine

## 2012-11-04 VITALS — BP 146/82 | HR 76 | Temp 97.8°F | Ht 64.0 in | Wt 255.6 lb

## 2012-11-04 DIAGNOSIS — M48061 Spinal stenosis, lumbar region without neurogenic claudication: Secondary | ICD-10-CM

## 2012-11-04 DIAGNOSIS — I10 Essential (primary) hypertension: Secondary | ICD-10-CM

## 2012-11-04 DIAGNOSIS — G35 Multiple sclerosis: Secondary | ICD-10-CM

## 2012-11-04 DIAGNOSIS — I872 Venous insufficiency (chronic) (peripheral): Secondary | ICD-10-CM

## 2012-11-04 MED ORDER — GABAPENTIN 300 MG PO CAPS: 300.0000 mg | ORAL_CAPSULE | Freq: Three times a day (TID) | ORAL | Status: DC

## 2012-11-04 MED ORDER — PREDNISONE 20 MG PO TABS: 20.0000 mg | ORAL_TABLET | Freq: Three times a day (TID) | ORAL | Status: DC | PRN

## 2012-11-04 MED ORDER — TRAMADOL HCL 50 MG PO TABS: 50.0000 mg | ORAL_TABLET | Freq: Four times a day (QID) | ORAL | Status: DC

## 2012-11-04 MED ORDER — COLCHICINE 0.6 MG PO TABS: 0.6000 mg | ORAL_TABLET | Freq: Every day | ORAL | Status: DC

## 2012-11-04 MED ORDER — DIAZEPAM 5 MG PO TABS: 5.0000 mg | ORAL_TABLET | Freq: Four times a day (QID) | ORAL | Status: DC | PRN

## 2012-11-04 NOTE — Patient Instructions (Addendum)
See me in 2-3 months for a pap smear and maybe an endometrial biopsy if the irregular bleeding continues. Keep an eye on your blood pressure.  You may need to go back on medications at some point. Low salt diet and elevate your legs for the swelling. Let me know about any paperwork you need completed.   All meds should be refilled with 3 month supply.

## 2012-11-04 NOTE — Assessment & Plan Note (Signed)
Regular gabapentin

## 2012-11-05 DIAGNOSIS — I872 Venous insufficiency (chronic) (peripheral): Secondary | ICD-10-CM | POA: Insufficient documentation

## 2012-11-05 HISTORY — DX: Venous insufficiency (chronic) (peripheral): I87.2

## 2012-11-05 NOTE — Progress Notes (Signed)
  Subjective:    Patient ID: Barbara [name], female    DOB: 1955/11/08, 57 y.o.   MRN: [id number]  HPI Wow, I have some catching up to do. She had C spine surgery at [facility].  Had significant weakness in both legs perhaps in part due to the surgery and likely a significant flair of her MS.  Had three weeks at [facility] for rehab. Feels like her leg strength is back to it's pre surg baseline.    Irregular menstrual bleeding.  Only has bleeding when on prednisone.  Bleeding now.  Also had menses in may. Before that, last menses was Sept 13.  Due for a pap  Due for mammo  Cannot return to her previous work.  At best can only sit/stand/work for 2 consecutive hours per her neurosurg and rehab docs.  Plans to apply for long term disability.  Given her weakness, I support this application.  Leg swelling.  Recurrent dependant edema.  Did not have in nursing home - but she was mostly in bed Reviewed labs.  Creat, albumin and LFTs normal.  Swelling is symetric so clot highly unlikely.  No hx of cardiac problems/CHF.  Does admit to some dietary sodium indiscretion.    Review of Systems     Objective:   Physical Exam Lungs clear Cardiac RRR with 2/6 SEM  Abd benign Ext symetric 2-3+ edema       Assessment & Plan:

## 2012-11-05 NOTE — Assessment & Plan Note (Signed)
Likely cause of her edema.  No diuretic for now.

## 2012-11-05 NOTE — Assessment & Plan Note (Signed)
Renewed meds.  She will see someone else at [facility] neuro now that [facility] retired.  She takes pred early for outbreak to avoid hospitalizations.

## 2012-11-05 NOTE — Assessment & Plan Note (Signed)
Taken off BP meds post op due to low BP.  Will monitor for now and restart prn.  Low salt diet should also help.

## 2012-11-15 ENCOUNTER — Encounter: Payer: Self-pay | Admitting: Family Medicine

## 2012-11-24 ENCOUNTER — Encounter: Payer: 59 | Admitting: Vascular Surgery

## 2012-12-24 ENCOUNTER — Encounter: Payer: Self-pay | Admitting: Family Medicine

## 2012-12-24 MED ORDER — METHOCARBAMOL 750 MG PO TABS: 750.0000 mg | ORAL_TABLET | Freq: Four times a day (QID) | ORAL | Status: DC

## 2012-12-30 ENCOUNTER — Ambulatory Visit: Payer: Self-pay | Admitting: Diagnostic Neuroimaging

## 2013-01-20 ENCOUNTER — Encounter: Payer: Self-pay | Admitting: Diagnostic Neuroimaging

## 2013-03-11 ENCOUNTER — Other Ambulatory Visit: Payer: Self-pay

## 2013-03-19 ENCOUNTER — Encounter: Payer: Self-pay | Admitting: Diagnostic Neuroimaging

## 2013-03-19 ENCOUNTER — Ambulatory Visit (INDEPENDENT_AMBULATORY_CARE_PROVIDER_SITE_OTHER): Payer: Self-pay | Admitting: Diagnostic Neuroimaging

## 2013-03-19 VITALS — BP 145/76 | HR 75 | Temp 98.5°F | Ht 64.0 in | Wt 259.5 lb

## 2013-03-19 DIAGNOSIS — G35 Multiple sclerosis: Secondary | ICD-10-CM

## 2013-03-19 NOTE — Progress Notes (Signed)
[facility]  PATIENT: Barbara [name] DOB: 1956-01-05  REFERRING CLINICIAN:  HISTORY FROM: patient REASON FOR VISIT: follow up   HISTORICAL  CHIEF COMPLAINT:  Chief Complaint  Patient presents with  . Follow-up    MS    HISTORY OF PRESENT ILLNESS:   UPDATE 03/19/13: Since last visit patient had lumbar decompression and fusion surgery at [facility] in May 2014. Following this she had significant difficulty walking and increased urinary incontinence. Patient followed up with her surgeon, who felt that the symptoms were more related to MS exacerbations rather than her of lumbar spine stenosis and surgery. Unfortunately patient does not have any insurance and is not able to afford her Copaxone. She has had injections left from when she was in the nursing home for rehabilitation, and now she is taking the injections every other day to stretch them out. She's in the process of applying for Medicare and disability, and has some lawyers working on her behalf to pursue this.  PRIOR HPI (2/276/14, Dr. Sandria Manly): 12 year old left-handed white widowed female with a 5 year history of  a tendency to fall forward while walking. Occasionally she will have retropulsion. She feels like "I'm going to fall". She had fallen numerous times and is uses  a walker. Falls assessment tool  score was 17. She has weakness in her left foot and leg more  than the right. She has  lower back pain and was seen by Dr. Newell Coral with MRI study of the lumbar spine without contrast 11/10/2008 showing spondylosis with  moderately severe central canal and lateral  recess stenosis of L4-5  and at L2-3 and L3-4. She has a three-year history of bladder symptoms with urinary incontinence. She does not have pain radiating into her legs. While walking she feels her legs begin to get weak and she falls forward. She has undergone epidural steroid injections by Dr. Alveda Reasons without benefit. She describes episodes in  which her legs "jump" and she has no control. She says her legs are like "thumper". This occurs when she is getting into the shower.She has  neck pain but none radiating into her arms. At times her left and right hand feel numb. At one point she was thought to have carpal tunnel syndrome.She denies Lhermitte's sign.05/16/2010 she noticed that her right eyelid was sore. 05/18/2010 she could not see in the right eye. She went to an optometrist. She was seen 05/23/2010 by Dr. Jethro Bolus and referred to [facility] who noted edema and a swollen disc on the right with NLP vision from optic neuritis. MRI study of the brain and cervical spine without and with contrast 05/23/10 showed focal T2 hyperintensity in the right optic nerve, one adjacent to the left lateral ventricle, and 3 discrete foci of T2 hyperintensity within the spinal cord. There was moderate central stenosis at C5-6 secondary to right paracentral disc protrusion and cord signal abnormality at C5-6 ,possibly secondary to the disc disease present. She began 1000 mg of IV Solu-Medrol daily for 3 days 05/25/2010 without improvement in NLP vision. Sjogren's antibodies, NMO IgG auto antibodies, ACE, ANA,sedimentation rate, B12, and Lyme titer were negative.CSF 06/15/2010 revealed 6 WBC, 0RBC, protein 66, glucose 85, VDRL NR, greater than 5 oligoclonal bands, CSF IgG 6.4 and normal ACE. She was on Betaseron 06/2010 and noticed the next day her legs didn't move as well. She was titrated  to full dose with flu like symptoms, leg cramps, episodes of crying, and decreased urination. She discontinued  Betaseron with much less falling and resolution of Flu symptoms.Cevical MRI without and with contrast 09/19/2010 showed increased cord signal in the  cervical spine at C6-7 suggestive of progressive disease and  other cord signal abnormality at C2-3 and C4 which was stable. MRI of the lumbar spine 09/19/2010 showed normal signal in the  medullaris and multifocal spondylosis  with right lateral recess and foraminal stenosis at L2-L3 and L3-4 and left greater than right lateral recess stenosis of L4- L5 She has severe lower back pain. Lumbar MRI 10/16/013 showed progressive disease. She started Copaxone 03/23/11 with improvement in symptoms. She has back pain down the left greater than right leg, worse with walking and activity.She underwent C5-6 C6-7 and C7-T1 diskectomy and fusion with plate and screws 06/14/2011 by  Dr. Timoteo Ace She has done well postoperatively, but no improvement in numbness in her hands and feet.   Dr. Timoteo Ace is treating her lumbar spinal stenosis with epidural steroids, acupuncture, and then consideration of surgery. She walked 10 feet without a walker and fell 02/24/12. At [facility] CT of the head and cervical spine and lumbarspine x-rays showed no acute intracranial abnormality, cervical fusion, and DJD in the lumbar spine. Back  pain increases with walking.  [facility]  OT felt she is disabled. Pain is 3-7/10 treated with tramadol and ibuprofen She has numbness left greater than right hand, dropping things.  REVIEW OF SYSTEMS: Full 14 system review of systems performed and notable only for numbness weakness joint pain joint swelling aching muscles incontinence fatigue.  ALLERGIES: No Known Allergies  HOME MEDICATIONS: Outpatient Prescriptions Prior to Visit  Medication Sig Dispense Refill  . aspirin 81 MG tablet Take 81 mg by mouth daily.        . calcium-vitamin D (OSCAL WITH D) 500-200 MG-UNIT per tablet Take 1 tablet by mouth daily.        . colchicine 0.6 MG tablet Take 1 tablet (0.6 mg total) by mouth daily.  90 tablet  3  . COPAXONE 20 MG/ML injection INJECT SUBCUTANEOUSLY DAILY  1 each  6  . diazepam (VALIUM) 5 MG tablet Take 1 tablet (5 mg total) by mouth every 6 (six) hours as needed (for leg spasms).  90 tablet  3  . fish oil-omega-3 fatty acids 1000 MG capsule Take 1 g by mouth daily.        Marland Kitchen ibuprofen (ADVIL,MOTRIN) 200 MG tablet Take  800 mg by mouth every 6 (six) hours as needed.      . methocarbamol (ROBAXIN) 750 MG tablet Take 1 tablet (750 mg total) by mouth 4 (four) times daily.  360 tablet  3  . Multiple Vitamin (MULTIVITAMIN) tablet Take 1 tablet by mouth daily.        . predniSONE (DELTASONE) 20 MG tablet Take 1 tablet (20 mg total) by mouth 3 (three) times daily as needed. For MS flair  45 tablet  3  . traMADol (ULTRAM) 50 MG tablet Take 1 tablet (50 mg total) by mouth 4 (four) times daily.  360 tablet  3  . gabapentin (NEURONTIN) 300 MG capsule Take 1 capsule (300 mg total) by mouth 3 (three) times daily.  270 capsule  3  . niacin 250 MG tablet Take 250 mg by mouth daily with breakfast.         No facility-administered medications prior to visit.    PAST MEDICAL HISTORY: Past Medical History  Diagnosis Date  . Lumbar spondylosis   . Spinal stenosis   .  Weakness   . Numbness   . Lower back pain   . Gout   . MS (multiple sclerosis)     PAST SURGICAL HISTORY: Past Surgical History  Procedure Laterality Date  . Cervical disc surgery      Qusetions C5-6 C6-7 and C7-T1    FAMILY HISTORY: Family History  Problem Relation Age of Onset  . Heart disease Mother   . Diabetes Mother   . Cancer Father     SOCIAL HISTORY:  History   Social History  . Marital Status: Widowed    Spouse Name: N/A    Number of Children: 0  . Years of Education: college   Occupational History  .     Social History Main Topics  . Smoking status: Never Smoker   . Smokeless tobacco: Never Used  . Alcohol Use: No  . Drug Use: No  . Sexual Activity: Not Currently     Comment: husband died 11-09-10   Other Topics Concern  . Not on file   Social History Narrative   Patient lives at home alone.   Caffeine Use: 1 cup daily     PHYSICAL EXAM  Filed Vitals:   03/19/13 0831  BP: 145/76  Pulse: 75  Temp: 98.5 F (36.9 C)  TempSrc: Oral  Height: 5\' 4"  (1.626 m)  Weight: 259 lb 8 oz (117.708 kg)     Not recorded    Body mass index is 44.52 kg/(m^2).  GENERAL EXAM: Patient is in no distress  CARDIOVASCULAR: Regular rate and rhythm, no murmurs, no carotid bruits  NEUROLOGIC: MENTAL STATUS: awake, alert, language fluent, comprehension intact, naming intact CRANIAL NERVE: no papilledema on fundoscopic exam, RIGHT RAPD. pupils reactive to light, DECR VISION IN RIGHT EYE (20/100). LEFT EYE 20/40. Visual fields full to confrontation, extraocular muscles intact, no nystagmus, facial sensation and strength symmetric, uvula midline, shoulder shrug symmetric, tongue midline. MOTOR: normal bulk and tone, full strength in the BUE; RLE (HF 3, KE 4, KF 4, DF 3). LLE (HF 2, KE 3, KF 3, DF 2).  SENSORY: DECR IN LLE COORDINATION: finger-nose-finger, fine finger movements SLOW REFLEXES: BUE 3, KNEES 3, ANKLES 2.  GAIT/STATION: SPASTIC GAIT (LLE > RLE). UNSTEADY. USES A ROLLING WALKER.   DIAGNOSTIC DATA (LABS, IMAGING, TESTING) - I reviewed patient records, labs, notes, testing and imaging myself where available.  No results found for this basename: WBC, HGB, HCT, MCV, PLT      Component Value Date/Time   NA 144 11/01/2011 1116   K 4.0 11/01/2011 1116   CL 105 11/01/2011 1116   CO2 28 11/01/2011 1116   GLUCOSE 109* 11/01/2011 1116   BUN 17 11/01/2011 1116   CREATININE 0.83 11/01/2011 1116   CREATININE 0.99 05/23/2010 1714   CALCIUM 9.3 11/01/2011 1116   PROT 6.8 11/01/2011 1116   ALBUMIN 4.2 11/01/2011 1116   AST 46* 11/01/2011 1116   ALT 27 11/01/2011 1116   ALKPHOS 91 11/01/2011 1116   BILITOT 0.6 11/01/2011 1116   GFRNONAA 59* 05/23/2010 1714   GFRAA  Value: >60        The eGFR has been calculated using the MDRD equation. This calculation has not been validated in all clinical situations. eGFR's persistently <60 mL/min signify possible Chronic Kidney Disease. 05/23/2010 1714   Lab Results  Component Value Date   CHOL 154 11/01/2011   HDL 29* 11/01/2011   LDLCALC 75 11/01/2011   TRIG 248* 11/01/2011    CHOLHDL 5.3 11/01/2011  No results found for this basename: HGBA1C   No results found for this basename: VITAMINB12   Lab Results  Component Value Date   TSH 2.034 11/01/2011    06/15/10 CSF studies: WBC 6, RBC 0, protein 66, glucose 85, VDRL neg, MBP < 2.0, oligoclonal bands > 5, IgG 5.4 (H)  05/24/10 MRI brain - Focal T2 hyperintensity and subtle enhancement in the right optic nerve. This is concerning for optic neuritis. This can be seen in the setting of multiple sclerosis. A discrete mass is not evident. Only one other focal T2 hyperintensity is evident. This is adjacent to the left lateral ventricle and does reach the callosal septal margin.  05/24/10 MRI cervical - At least three discrete foci of T2 hyperintensity are present without significant cord expansion. This is nonspecific, but raises concern for a demyelinating process such as multiple sclerosis, as suspected. Moderate central canal stenosis at C5-6 secondary to a prominent right paracentral disc protrusion with distortion of the cord. Cord signal abnormality at C5-6 may be secondary to the disc disease. A demyelinating disease could also account for this finding. Multilevel spondylosis with predominately left-sided uncovertebral disease and foraminal stenosis. Diffuse decreased T1 marrow signal. This is nonspecific, but can be seen setting of anemia, chronic hypoxia, or obesity most commonly.  03/19/12 MRI brain -  mild periventricular white matter nonspecific hyperintensities. Compared to MRI scan dated 05/23/2010 the left periventricular and right optic nerve lesions are no longer seen.   ASSESSMENT AND PLAN  57 y.o. year old female here with multiple sclerosis. On copaxone, but cannot afford due to loss of insurance.  PROBLEM LIST:   Multiple sclerosis (right optic neuritis, myelopathy)   Cervical spinal stenosis (moderate at C5-6) s/p surgery C5-6, C6-7 and C7-T1 06/14/11  Lumbar spinal stenosis (severe at L3-4) s/p  surgery May 2014   PLAN: - will help patient apply for financial assistance for copaxone; will also look at other MS meds for assistance  Return in about 6 months (around 09/16/2013) for with Heide Guile or Penumalli.   Suanne Marker, MD 03/19/2013, 10:22 AM Certified in Neurology, Neurophysiology and Neuroimaging  [facility] [address], [address] [place], [place] [address] [phone]

## 2013-03-19 NOTE — Patient Instructions (Signed)
We will look into patient/financial assistance for your MS medication (copaxone).  If this doesn't work, then we will look into other MS medications for patient assistance (plegridy, gilenya or tecfidera).

## 2013-03-31 ENCOUNTER — Ambulatory Visit: Payer: 59 | Admitting: Family Medicine

## 2013-04-04 ENCOUNTER — Encounter: Payer: Self-pay | Admitting: Diagnostic Neuroimaging

## 2013-04-06 NOTE — Telephone Encounter (Signed)
Hi, Amayra, this is [place] , Charity fundraiser.  I consulted Dr. Marjory Lies and he stated that seeing a urologist would be the best, but can try a medication that may help if this is something you would like to try.

## 2013-04-23 ENCOUNTER — Ambulatory Visit: Payer: 59 | Admitting: Family Medicine

## 2013-05-04 ENCOUNTER — Ambulatory Visit: Payer: 59 | Admitting: Family Medicine

## 2013-05-04 DIAGNOSIS — Z0289 Encounter for other administrative examinations: Secondary | ICD-10-CM

## 2013-05-25 ENCOUNTER — Telehealth: Payer: Self-pay

## 2013-05-25 NOTE — Telephone Encounter (Signed)
Abilities statement along with requested records have been faxed to [facility]

## 2013-06-01 ENCOUNTER — Other Ambulatory Visit: Payer: Self-pay | Admitting: Family Medicine

## 2013-06-02 NOTE — Telephone Encounter (Signed)
Phoned in refill.  

## 2013-06-09 ENCOUNTER — Ambulatory Visit (INDEPENDENT_AMBULATORY_CARE_PROVIDER_SITE_OTHER): Payer: Self-pay | Admitting: Family Medicine

## 2013-06-09 ENCOUNTER — Encounter: Payer: Self-pay | Admitting: Family Medicine

## 2013-06-09 VITALS — BP 146/88 | HR 71 | Temp 98.1°F | Ht 64.0 in | Wt 261.1 lb

## 2013-06-09 DIAGNOSIS — M25551 Pain in right hip: Secondary | ICD-10-CM | POA: Insufficient documentation

## 2013-06-09 DIAGNOSIS — I10 Essential (primary) hypertension: Secondary | ICD-10-CM

## 2013-06-09 DIAGNOSIS — M25559 Pain in unspecified hip: Secondary | ICD-10-CM

## 2013-06-09 DIAGNOSIS — Z23 Encounter for immunization: Secondary | ICD-10-CM

## 2013-06-09 MED ORDER — LISINOPRIL 10 MG PO TABS: 10.0000 mg | ORAL_TABLET | Freq: Every day | ORAL | Status: DC

## 2013-06-09 NOTE — Patient Instructions (Signed)
I hope the coritsone shot helps.  I think one part of your hip pain is greater trochanteric bursitis I will call with Xray results. Remember, you promised to get a mammogram See me within the next 3 months and we will try to do a pap smear on you.

## 2013-06-10 NOTE — Assessment & Plan Note (Signed)
Likely multifactorial pain.  By exam, has element of greater trochanteric bursitis.  After informed consent, did injection with 3 cc xylocaine and 80 mg depo medrol with good immediate relief.  Will still get X rays.

## 2013-06-10 NOTE — Progress Notes (Signed)
   Subjective:    Patient ID: Barbara [name], female    DOB: 11/19/1955, 58 y.o.   MRN: [id number]  HPI Busy year.  Surgery first on neck and then on lower back.  Has been officially ruled disabled.  No longer working.  Continues to have a good, upbeat attitude.   C/O Right hip pain  Has fallen multiple times with her MS.  No severe trauma but does generally fall on her right side.  Pain at night and cannot sleep on that side. Also behind on several health maint issues.  Will see me again in one month.    Review of Systems     Objective:   Physical Exam Right hip has diffuse mild tenderness but has exquisite tenderness over greater trochanter.  Gait is normal for her.  Uses walker due to MS.  Right hip rides a little higher than left.  No appreciable lumbar scoliosis.       Assessment & Plan:

## 2013-06-11 ENCOUNTER — Encounter: Payer: Self-pay | Admitting: Family Medicine

## 2013-07-02 ENCOUNTER — Other Ambulatory Visit: Payer: Self-pay | Admitting: *Deleted

## 2013-07-02 DIAGNOSIS — I10 Essential (primary) hypertension: Secondary | ICD-10-CM

## 2013-07-02 MED ORDER — LISINOPRIL 10 MG PO TABS: 10.0000 mg | ORAL_TABLET | Freq: Every day | ORAL | Status: DC

## 2013-09-17 ENCOUNTER — Ambulatory Visit: Payer: Self-pay | Admitting: Nurse Practitioner

## 2013-10-11 ENCOUNTER — Ambulatory Visit: Payer: Self-pay | Admitting: Nurse Practitioner

## 2013-10-14 ENCOUNTER — Ambulatory Visit
Admission: RE | Admit: 2013-10-14 | Discharge: 2013-10-14 | Disposition: A | Discharge status: Home / Self Care | Payer: No Typology Code available for payment source | Source: Ambulatory Visit | Attending: Family Medicine | Admitting: Family Medicine

## 2013-10-14 DIAGNOSIS — M25551 Pain in right hip: Secondary | ICD-10-CM

## 2013-10-20 ENCOUNTER — Other Ambulatory Visit: Payer: Self-pay | Admitting: *Deleted

## 2013-10-21 MED ORDER — DIAZEPAM 5 MG PO TABS: 5.0000 mg | ORAL_TABLET | Freq: Four times a day (QID) | ORAL | Status: DC | PRN

## 2013-10-21 NOTE — Telephone Encounter (Signed)
Called in refill  

## 2013-11-01 ENCOUNTER — Other Ambulatory Visit: Payer: Self-pay

## 2013-11-01 MED ORDER — GLATIRAMER ACETATE 20 MG/ML ~~LOC~~ SOSY: 20.0000 mg | PREFILLED_SYRINGE | Freq: Every day | SUBCUTANEOUS | Status: DC

## 2013-11-08 ENCOUNTER — Ambulatory Visit (INDEPENDENT_AMBULATORY_CARE_PROVIDER_SITE_OTHER): Payer: No Typology Code available for payment source | Admitting: Family Medicine

## 2013-11-08 ENCOUNTER — Encounter: Payer: Self-pay | Admitting: Family Medicine

## 2013-11-08 VITALS — BP 138/88 | HR 82 | Temp 97.7°F | Ht 64.0 in

## 2013-11-08 DIAGNOSIS — M25559 Pain in unspecified hip: Secondary | ICD-10-CM

## 2013-11-08 DIAGNOSIS — M25551 Pain in right hip: Secondary | ICD-10-CM

## 2013-11-08 DIAGNOSIS — M25552 Pain in left hip: Secondary | ICD-10-CM

## 2013-11-08 NOTE — Progress Notes (Signed)
   Subjective:    Patient ID: Barbara [name], female    DOB: [birth year], 58 y.o.   MRN: [id number]  HPI  Barbara [name] is here for b/l trochanteric bursitis injection.  Patient with a right trochanteric bursa injection in February. She has a history of MS. She reports that the injection helped during this time.  She reports her pain becoming worse. The pain is located on her lateral hip bilaterally. She hasn't fallen since May and reports no trauma. She cannot sleep on her right side.    Current Outpatient Prescriptions on File Prior to Visit  Medication Sig Dispense Refill  . aspirin 81 MG tablet Take 81 mg by mouth daily.        . calcium-vitamin D (OSCAL WITH D) 500-200 MG-UNIT per tablet Take 1 tablet by mouth daily.        . colchicine 0.6 MG tablet Take 1 tablet (0.6 mg total) by mouth daily.  90 tablet  3  . diazepam (VALIUM) 5 MG tablet Take 1 tablet (5 mg total) by mouth every 6 (six) hours as needed (for leg spasms).  90 tablet  5  . fish oil-omega-3 fatty acids 1000 MG capsule Take 1 g by mouth daily.        Marland Kitchen gabapentin (NEURONTIN) 300 MG capsule Take 300 mg by mouth daily.      Marland Kitchen glatiramer (COPAXONE) 20 MG/ML SOSY injection Inject 1 mL (20 mg total) into the skin daily.  1 kit  6  . ibuprofen (ADVIL,MOTRIN) 200 MG tablet Take 800 mg by mouth every 6 (six) hours as needed.      Marland Kitchen lisinopril (PRINIVIL,ZESTRIL) 10 MG tablet Take 1 tablet (10 mg total) by mouth daily.  90 tablet  3  . methocarbamol (ROBAXIN) 750 MG tablet Take 1 tablet (750 mg total) by mouth 4 (four) times daily.  360 tablet  3  . Multiple Vitamin (MULTIVITAMIN) tablet Take 1 tablet by mouth daily.        . predniSONE (DELTASONE) 20 MG tablet Take 1 tablet (20 mg total) by mouth 3 (three) times daily as needed. For MS flair  45 tablet  3  . traMADol (ULTRAM) 50 MG tablet TAKE 1 TABLET BY MOUTH 4 TIMES DAILY  360 tablet  1   No current facility-administered medications on file prior to visit.    Review of  Systems See HPI     Objective:   Physical Exam BP 138/88  Pulse 82  Temp(Src) 97.7 F (36.5 C) (Oral)  Ht $R'5\' 4"'Xg$  (1.626 m) Gen: NAD, alert, cooperative with exam, well-appearing MSK: tender to palpation over greater trochanter b/l, Uses walker due to MS, ambulation is limited.   INJECTION: Left and Right hip  Patient was given informed consent, signed copy in the chart. Appropriate time out was taken. Area prepped and draped in usual sterile fashion. One cc of methylprednisolone 30 mg/ml plus one cc of 1% lidocaine without epinephrine was injected into the trochanteric bursa using a(n) perpendicular approach. The patient tolerated the procedure well. There were no complications. Post procedure instructions were given.  Performed under the supervision of Dr. Gwendlyn Deutscher       Assessment & Plan:

## 2013-11-08 NOTE — Patient Instructions (Signed)
Thank you for coming in,   Please call back if you notice any redness or swelling from the injection.   Please follow up with Dr. Leveda AnnaHensel as necessary.    Please feel free to call with any questions or concerns at any time, at [phone]. --Dr. Jordan LikesSchmitz

## 2013-11-09 ENCOUNTER — Encounter: Payer: Self-pay | Admitting: Family Medicine

## 2013-11-09 DIAGNOSIS — M25552 Pain in left hip: Secondary | ICD-10-CM | POA: Insufficient documentation

## 2013-11-09 NOTE — Assessment & Plan Note (Signed)
Pain is most likely multifactorial. With a hx of MS, uses a walker for ambulation and reported length difference in right and left. Denies any recent fall or trauma. Injection of trochanteric bursa. Informed consent and then injection as described in the progress note.  - Discussed and injection performed under supervision of Dr. Lum BabeEniola.

## 2013-11-09 NOTE — Assessment & Plan Note (Addendum)
Injection of trochanteric bursa. Review of x-rays showed no fracture or subluxation. Informed consent and then injection was performed as described in the progress note. Pain relived after injection.  - discussed and injection performed under supervision of Dr. Lum Babe.

## 2013-11-14 ENCOUNTER — Telehealth: Payer: Self-pay

## 2013-11-14 NOTE — Telephone Encounter (Signed)
[employer] sent [place] a letter saying they have approved our request for coverage on Copaxone effective until 04/30/2014 Ref # [id number]

## 2013-11-25 ENCOUNTER — Encounter: Payer: Self-pay | Admitting: Family Medicine

## 2013-11-29 ENCOUNTER — Other Ambulatory Visit: Payer: Self-pay | Admitting: Family Medicine

## 2014-01-21 ENCOUNTER — Other Ambulatory Visit: Payer: Self-pay | Admitting: Family Medicine

## 2014-01-21 NOTE — Telephone Encounter (Signed)
Pt will be completely of meds by Monday. Advised pt that [name] will not return until Tuesday. Pls refill.

## 2014-01-24 NOTE — Telephone Encounter (Signed)
Tia WHICH meds are we talking about? Denny Levy

## 2014-02-23 ENCOUNTER — Encounter: Payer: Self-pay | Admitting: Diagnostic Neuroimaging

## 2014-02-23 ENCOUNTER — Other Ambulatory Visit: Payer: Self-pay | Admitting: *Deleted

## 2014-02-24 MED ORDER — GABAPENTIN 300 MG PO CAPS
300.0000 mg | ORAL_CAPSULE | Freq: Every day | ORAL | Status: DC
Start: 2014-02-24 — End: 2014-04-20

## 2014-03-08 ENCOUNTER — Ambulatory Visit (INDEPENDENT_AMBULATORY_CARE_PROVIDER_SITE_OTHER): Payer: Self-pay | Admitting: Nurse Practitioner

## 2014-03-08 ENCOUNTER — Encounter: Payer: Self-pay | Admitting: Nurse Practitioner

## 2014-03-08 VITALS — BP 136/84 | HR 91

## 2014-03-08 DIAGNOSIS — M4802 Spinal stenosis, cervical region: Secondary | ICD-10-CM

## 2014-03-08 DIAGNOSIS — M48061 Spinal stenosis, lumbar region without neurogenic claudication: Secondary | ICD-10-CM

## 2014-03-08 DIAGNOSIS — H5440 Blindness, one eye, unspecified eye: Secondary | ICD-10-CM

## 2014-03-08 DIAGNOSIS — M4806 Spinal stenosis, lumbar region: Secondary | ICD-10-CM

## 2014-03-08 DIAGNOSIS — G35 Multiple sclerosis: Secondary | ICD-10-CM

## 2014-03-08 DIAGNOSIS — H5441 Blindness, right eye, normal vision left eye: Secondary | ICD-10-CM

## 2014-03-08 NOTE — Progress Notes (Signed)
PATIENT: Barbara [name] DOB: [birth year]  REASON FOR VISIT: routine follow up for MS HISTORY FROM: patient  HISTORY OF PRESENT ILLNESS: 58 year old Caucasian female with history of MS, formerly treated by [name] and now by [name].  UPDATE 03/08/14 (LL): Since last visit patient was approved for SSI disability.  She is not eligible for Medicare Until April 2016. She had health insurance briefly with [name], but Copaxone was still going to be $3000/month so she has been without any DMT since earlier this year. She states her long term disability from her job was discontinued because [employer] did not receive the required documentation from our office. We have fax confirmation that a [employer] Form" was completed and faxed on 05/25/13. She comes in today to see if there is any financial assistance from any drug companies to get back on treatment. Her mobility is poor but she is able to walk with a rolling walker. Her last fall was in May without injury. She thinks she had an MS exacerbation in August, she felt all-over numbness. [name] put her on a steroid taper and she felt better.  UPDATE 03/19/13: Since last visit patient had lumbar decompression and fusion surgery at [facility] in May 2014. Following this she had significant difficulty walking and increased urinary incontinence. Patient followed up with her surgeon, who felt that the symptoms were more related to MS exacerbations rather than her of lumbar spine stenosis and surgery. Unfortunately patient does not have any insurance and is not able to afford her Copaxone. She has had injections left from when she was in the nursing home for rehabilitation, and now she is taking the injections every other day to stretch them out. She's in the process of applying for Medicare and disability, and has some lawyers working on her behalf to pursue this.  PRIOR HPI (2/276/14, [name]):  57 year old left-handed white widowed female with a 5 year history of  a tendency to fall forward while walking. Occasionally she will have retropulsion. She feels like "I'm going to fall". She had fallen numerous times and is uses  a walker. Falls assessment tool  score was 17. She has weakness in her left foot and leg more  than the right. She has  lower back pain and was seen by [name] with MRI study of the lumbar spine without contrast 11/10/2008 showing spondylosis with  moderately severe central canal and lateral  recess stenosis of L4-5  and at L2-3 and L3-4. She has a three-year history of bladder symptoms with urinary incontinence. She does not have pain radiating into her legs. While walking she feels her legs begin to get weak and she falls forward. She has undergone epidural steroid injections by [name] without benefit. She describes episodes in which her legs "jump" and she has no control. She says her legs are like "thumper". This occurs when she is getting into the shower.She has  neck pain but none radiating into her arms. At times her left and right hand feel numb. At one point she was thought to have carpal tunnel syndrome.She denies Lhermitte's sign.05/16/2010 she noticed that her right eyelid was sore. 05/18/2010 she could not see in the right eye. She went to an optometrist. She was seen 05/23/2010 by [name] and referred to [name] who noted edema and a swollen disc on the right with NLP vision from optic neuritis. MRI study of the brain and cervical  spine without and with contrast 05/23/10 showed focal T2 hyperintensity in the right optic nerve, one adjacent to the left lateral ventricle, and 3 discrete foci of T2 hyperintensity within the spinal cord. There was moderate central stenosis at C5-6 secondary to right paracentral disc protrusion and cord signal abnormality at C5-6 ,possibly secondary to the disc disease present. She began 1000 mg of IV Solu-Medrol daily for 3  days 05/25/2010 without improvement in NLP vision. Sjogren's antibodies, NMO IgG auto antibodies, ACE, ANA,sedimentation rate, B12, and Lyme titer were negative.CSF 06/15/2010 revealed 6 WBC, 0RBC, protein 66, glucose 85, VDRL NR, greater than 5 oligoclonal bands, CSF IgG 6.4 and normal ACE. She was on Betaseron 06/2010 and noticed the next day her legs didn't move as well. She was titrated  to full dose with flu like symptoms, leg cramps, episodes of crying, and decreased urination. She discontinued Betaseron with much less falling and resolution of Flu symptoms.Cevical MRI without and with contrast 09/19/2010 showed increased cord signal in the  cervical spine at C6-7 suggestive of progressive disease and  other cord signal abnormality at C2-3 and C4 which was stable. MRI of the lumbar spine 09/19/2010 showed normal signal in the  medullaris and multifocal spondylosis with right lateral recess and foraminal stenosis at L2-L3 and L3-4 and left greater than right lateral recess stenosis of L4- L5 She has severe lower back pain. Lumbar MRI 10/16/013 showed progressive disease. She started Copaxone 03/23/11 with improvement in symptoms. She has back pain down the left greater than right leg, worse with walking and activity.She underwent C5-6 C6-7 and C7-T1 diskectomy and fusion with plate and screws 09/10/3218 by  Dr. [name] She has done well postoperatively, but no improvement in numbness in her hands and feet.   Dr. [name] is treating her lumbar spinal stenosis with epidural steroids, acupuncture, and then consideration of surgery. She walked 10 feet without a walker and fell 02/24/12. At [facility] CT of the head and cervical spine and lumbarspine x-rays showed no acute intracranial abnormality, cervical fusion, and DJD in the lumbar spine. Back  pain increases with walking.  [facility]  OT felt she is disabled. Pain is 3-7/10 treated with tramadol and ibuprofen She has numbness left greater than right hand, dropping  things.  REVIEW OF SYSTEMS: Full 14 system review of systems performed and notable only for numbness weakness joint pain joint swelling aching muscles incontinence fatigue.   ALLERGIES: No Known Allergies  HOME MEDICATIONS: Outpatient Prescriptions Prior to Visit  Medication Sig Dispense Refill  . aspirin 81 MG tablet Take 81 mg by mouth daily.      . calcium-vitamin D (OSCAL WITH D) 500-200 MG-UNIT per tablet Take 1 tablet by mouth daily.      . diazepam (VALIUM) 5 MG tablet Take 1 tablet (5 mg total) by mouth every 6 (six) hours as needed (for leg spasms). 90 tablet 5  . fish oil-omega-3 fatty acids 1000 MG capsule Take 1 g by mouth daily.      Marland Kitchen gabapentin (NEURONTIN) 300 MG capsule Take 1 capsule (300 mg total) by mouth daily. 90 capsule 3  . ibuprofen (ADVIL,MOTRIN) 200 MG tablet Take 800 mg by mouth every 6 (six) hours as needed.    Marland Kitchen lisinopril (PRINIVIL,ZESTRIL) 10 MG tablet Take 1 tablet (10 mg total) by mouth daily. 90 tablet 3  . methocarbamol (ROBAXIN) 750 MG tablet TAKE 1 TABLET BY MOUTH 4 (FOUR) TIMES DAILY. 360 tablet 0  . Multiple Vitamin (MULTIVITAMIN) tablet Take  1 tablet by mouth daily.      . predniSONE (DELTASONE) 20 MG tablet Take 1 tablet (20 mg total) by mouth 3 (three) times daily as needed. For MS flair 45 tablet 3  . traMADol (ULTRAM) 50 MG tablet TAKE 1 TABLET BY MOUTH 4 TIMES DAILY 360 tablet 1  . glatiramer (COPAXONE) 20 MG/ML SOSY injection Inject 1 mL (20 mg total) into the skin daily. 1 kit 6  . colchicine 0.6 MG tablet Take 1 tablet (0.6 mg total) by mouth daily. 90 tablet 3   No facility-administered medications prior to visit.    PHYSICAL EXAM Filed Vitals:   03/08/14 1453  BP: 136/84  Pulse: 91   Cannot calculate BMI with a height equal to zero.  Generalized: Well developed, obese Caucasian female in no acute distress  Head: normocephalic and atraumatic. Oropharynx benign  Neck: Supple, no carotid bruits  Cardiac: Regular rate rhythm, no  murmur  Musculoskeletal: No deformity   NEUROLOGIC: MENTAL STATUS: awake, alert, language fluent, comprehension intact, naming intact CRANIAL NERVE: no papilledema on fundoscopic exam, RIGHT RAPD. pupils reactive to light, DECR VISION IN RIGHT EYE (20/100). LEFT EYE 20/40. Visual fields full to confrontation, extraocular muscles intact, no nystagmus, facial sensation and strength symmetric, uvula midline, shoulder shrug symmetric, tongue midline. MOTOR: normal bulk and tone, full strength in the BUE; RLE (HF 3, KE 4, KF 4, DF 3). LLE (HF 2, KE 2, KF 2, DF 2).   SENSORY: DECR IN LLE COORDINATION: finger-nose-finger, fine finger movements SLOW REFLEXES: BUE 3, KNEES 3, ANKLES 2.   GAIT/STATION: SLOW SPASTIC GAIT (LLE > RLE). UNSTEADY. USES A ROLLING WALKER.  DIAGNOSTIC DATA (LABS, IMAGING, TESTING) - I reviewed patient records, labs, notes, testing and imaging myself where available.  06/15/10 CSF studies: WBC 6, RBC 0, protein 66, glucose 85, VDRL neg, MBP < 2.0, oligoclonal bands > 5, IgG 5.4 (H)  05/24/10 MRI brain - Focal T2 hyperintensity and subtle enhancement in the right optic nerve. This is concerning for optic neuritis. This can be seen in the setting of multiple sclerosis. A discrete mass is not evident. Only one other focal T2 hyperintensity is evident. This is adjacent to the left lateral ventricle and does reach the callosal septal margin.  05/24/10 MRI cervical - At least three discrete foci of T2 hyperintensity are present without significant cord expansion. This is nonspecific, but raises concern for a demyelinating process such as multiple sclerosis, as suspected. Moderate central canal stenosis at C5-6 secondary to a prominent right paracentral disc protrusion with distortion of the cord. Cord signal abnormality at C5-6 may be secondary to the disc disease. A demyelinating disease could also account for this finding. Multilevel spondylosis with predominately left-sided uncovertebral  disease and foraminal stenosis. Diffuse decreased T1 marrow signal. This is nonspecific, but can be seen setting of anemia, chronic hypoxia, or obesity most commonly.  03/19/12 MRI brain -  mild periventricular white matter nonspecific hyperintensities. Compared to MRI scan dated 05/23/2010 the left periventricular and right optic nerve lesions are no longer seen.  ASSESSMENT: 58 year old female here with multiple sclerosis. On copaxone previously, but cannot afford due to loss of insurance.  PROBLEM LIST:    Multiple sclerosis (right optic neuritis, myelopathy)    Cervical spinal stenosis (moderate at C5-6) s/p surgery C5-6, C6-7 and C7-T1 06/14/11  Lumbar spinal stenosis (severe at L3-4) s/p surgery May 2014  PLAN: Will help patient apply for financial assistance for copaxone; will also look at other  MS meds for assistance. I will speak to to Janett Billow, our Pharmacy Tech to see if there is financial assistance available from any of the drug companies.  Copaxone has worked well for her in the past, so it would make sense to stick with it if possible. Please call [facility] to see if they received the information from our office.  She was instructed to call or email me to let me know if  she needs a note faxed to them.  Continue to be very careful and use your walker at all times. She will need a repeat MRI, will defer to when Medicare starts. Followup appointment with [name] in April, when medicare becomes effective.Marland Kitchen   Rudi Rummage Capri Veals, MSN, FNP-BC, A/GNP-C 03/08/2014, 3:06 PM [facility] [address] [place], [place] [address] [phone]  Note: This document was prepared with digital dictation and possible smart phrase technology. Any transcriptional errors that result from this process are unintentional.

## 2014-03-08 NOTE — Patient Instructions (Addendum)
Please call [employer] to see if they received the information from our office.  Please call or email me to let me know if you need a note faxed to them.   I will speak to to Shanda Bumps, our Pharmacy Tech to see if there is financial assistance available from any of the drug companies.  Copaxone has worked well for you in the past, so it would make sense to stick with it if possible. I will email you when I get an answer.  Continue to be very careful and use your walker at all times.  Please make a followup appointment with Dr. Marjory Lies in April, when you get your medicare.   You will need a repeat MRI when you are able.

## 2014-03-10 ENCOUNTER — Encounter: Payer: Self-pay | Admitting: Nurse Practitioner

## 2014-04-08 ENCOUNTER — Other Ambulatory Visit: Payer: Self-pay | Admitting: Family Medicine

## 2014-04-11 NOTE — Telephone Encounter (Signed)
Pt called and is still waiting on a refill request for Robaxin . jw

## 2014-04-11 NOTE — Progress Notes (Signed)
I reviewed note and agree with plan.   VIKRAM R. PENUMALLI, MD  Certified in Neurology, Neurophysiology and Neuroimaging  [facility] [address], [address] [place], [place] [address] [phone]   

## 2014-04-12 ENCOUNTER — Encounter: Payer: Self-pay | Admitting: Family Medicine

## 2014-04-12 NOTE — Telephone Encounter (Signed)
-----   Message from [name] to Sanjuana Letters, MD sent at 04/12/2014 9:34 AM -----     I have an issue with pain management. Can we discuss via phone or would you prefer I come in to discuss? I'm happy to do either way.        My back and legs hurt all the time even with 4 times per day 50mg  Tramadol; 2 gabapentin tablets per day; and 4 Robaxin (generic) per day.         Thanks for your help.        Clois Dupes

## 2014-04-20 ENCOUNTER — Ambulatory Visit (INDEPENDENT_AMBULATORY_CARE_PROVIDER_SITE_OTHER): Payer: Self-pay | Admitting: Family Medicine

## 2014-04-20 ENCOUNTER — Ambulatory Visit (INDEPENDENT_AMBULATORY_CARE_PROVIDER_SITE_OTHER): Payer: Self-pay | Admitting: *Deleted

## 2014-04-20 ENCOUNTER — Encounter: Payer: Self-pay | Admitting: Family Medicine

## 2014-04-20 VITALS — BP 138/84 | HR 84 | Temp 98.1°F | Ht 64.0 in

## 2014-04-20 DIAGNOSIS — M4806 Spinal stenosis, lumbar region: Secondary | ICD-10-CM

## 2014-04-20 DIAGNOSIS — Z7189 Other specified counseling: Secondary | ICD-10-CM

## 2014-04-20 DIAGNOSIS — Z23 Encounter for immunization: Secondary | ICD-10-CM

## 2014-04-20 DIAGNOSIS — G8929 Other chronic pain: Secondary | ICD-10-CM

## 2014-04-20 DIAGNOSIS — M48061 Spinal stenosis, lumbar region without neurogenic claudication: Secondary | ICD-10-CM

## 2014-04-20 DIAGNOSIS — G35 Multiple sclerosis: Secondary | ICD-10-CM

## 2014-04-20 MED ORDER — GABAPENTIN 300 MG PO CAPS
300.0000 mg | ORAL_CAPSULE | Freq: Every day | ORAL | Status: DC
Start: 2014-04-20 — End: 2014-05-03

## 2014-04-20 NOTE — Assessment & Plan Note (Addendum)
Tweak gabapentin See chronic pain.

## 2014-04-20 NOTE — Patient Instructions (Signed)
You can keep taking the tramadol and muscle relaxant as you have been. Take the gabapentin 3 times each day.  As long as that is not making you drunk or too sleepy, we can go up on that dose.  OK to handle through my chart. Keep working with your neurologist to see if there is a treatment of the underlying problem OK to also use ibuprofen - easily you can take 9 pills a day.  The max I would want you to take is 12 pills a day. Ok to also use tylenol.   Ideally, I would like a combo of these medicines to control your pain rather than starting down the road of chronic narcotic therapy.

## 2014-04-21 DIAGNOSIS — Z7189 Other specified counseling: Secondary | ICD-10-CM | POA: Insufficient documentation

## 2014-04-21 NOTE — Assessment & Plan Note (Signed)
Currently, pain is poorly controled by tramadol and muscle relaxers.  Occasionally takes NSAIDs then adds that she avoids because she has been told they are bad.  Renal function is good and no history of GI bleed. Will boost gabapentin dose to better control neuropathic pain. Will augment tramadol with OTC ibuprofen.  We will handle further adjustments by phone because of transportation problems.

## 2014-04-21 NOTE — Assessment & Plan Note (Signed)
-   See "chronic pain"

## 2014-04-21 NOTE — Progress Notes (Signed)
   Subjective:    Patient ID: Barbara [name], female    DOB: [birth year], 57 y.o.   MRN: [id number]  HPI Barbara [name] has several issues: Biggest problem is back and leg pain.  She has a miserable combination of multiple sclerosis and spinal stenosis.  She cannot stay in any one position for long.  It has really impaired her ability to do her IADLs.  For example, she can no longer cook much or do dishes because she cannot stand for very long.  She has had two previous back surgeries.  She is seeing neuro.  We decided to work on pain management rather than explore treatment options of the underlying problems.  She will do that with neuro. Finances are an issue.  She will be on Medicare in a few months due to chronic disability.  Right now, it would be best to not run up big bills. As a result, neuro is holding off restarting MS meds and repeating back MRI until Medicare kicks in Transportation is a problem.  Cannot drive.  Relies on friends.   Plans to get a mammo when on Medicare.   Pap smears will be a real challenge due to her neuromuscular problems  For now, I have no plans.    Review of Systems     Objective:   Physical Exam Exam is limited.  She had to reposition several times in the wheelchair due to pain.  Given recent exam by neuro, I did not put her through the discomfort. Lungs clear Cardiac RRR without m or g         Assessment & Plan:

## 2014-05-03 ENCOUNTER — Telehealth: Payer: Self-pay | Admitting: *Deleted

## 2014-05-03 DIAGNOSIS — M48061 Spinal stenosis, lumbar region without neurogenic claudication: Secondary | ICD-10-CM

## 2014-05-03 MED ORDER — GABAPENTIN 300 MG PO CAPS: 300.0000 mg | ORAL_CAPSULE | Freq: Three times a day (TID) | ORAL | Status: DC

## 2014-05-03 NOTE — Telephone Encounter (Signed)
I reviewed Dr. Cyndia SkeetersHensel's last note--looks like he intended to increase gabapentin to 300 mg tid--I will send new rx in for  That dose.

## 2014-05-03 NOTE — Telephone Encounter (Signed)
Received a fax from [facility] pharmacy needing a new Rx for Gabapentin.  Last Rx 04/20/2014 Gabapentin 300 mg 1 capsule daily.  Pt stated increased to taking 3 capsules daily.  Last office visit with PCP 04/20/2014. Please advise.  Clovis Pu, RN

## 2014-05-10 ENCOUNTER — Other Ambulatory Visit: Payer: Self-pay | Admitting: Family Medicine

## 2014-05-16 ENCOUNTER — Other Ambulatory Visit: Payer: Self-pay | Admitting: *Deleted

## 2014-05-16 MED ORDER — TRAMADOL HCL 50 MG PO TABS: ORAL_TABLET | ORAL | Status: DC

## 2014-05-20 ENCOUNTER — Encounter: Payer: Self-pay | Admitting: Family Medicine

## 2014-05-23 ENCOUNTER — Telehealth: Payer: Self-pay | Admitting: *Deleted

## 2014-05-23 DIAGNOSIS — M48061 Spinal stenosis, lumbar region without neurogenic claudication: Secondary | ICD-10-CM

## 2014-05-23 NOTE — Telephone Encounter (Signed)
Called and left message.  Sounds like gabapentin may not be the best for her.  Will call back and speak directly to Barbara [name].

## 2014-05-23 NOTE — Telephone Encounter (Signed)
 -----   Message from [name] to Sanjuana Letters, MD sent at 05/20/2014 10:31 AM -----     Gabapentin - at my last visit, 12/16, you suggested increasing Gabapentin to 3 x per day to help with pain. It's been a month and I see (feel) little back relief from this and yesterday I had a scary fainting feeling all day and I felt it other times since taking 3 per day. Want me to continue taking 3 (300 mg)? Just wondering when they side effects may go away.        Also, will be sending you some paperwork from [employer] they want to know if I'm still disabled.         Thanks for your continued help. Have a wonderful day/weekend.        Clois Dupes

## 2014-05-24 MED ORDER — GABAPENTIN 300 MG PO CAPS: 300.0000 mg | ORAL_CAPSULE | Freq: Two times a day (BID) | ORAL | Status: DC

## 2014-05-24 NOTE — Addendum Note (Signed)
Addended by: Moses Manners on: 05/24/2014 04:19 PM   Modules accepted: Orders, Medications

## 2014-05-24 NOTE — Telephone Encounter (Signed)
Gabapentin BID does help with her nerve pain.  It does not help with back and hip pain.  We discussed various next steps.  Will start by adding scheduled ibuprofen,  4x/day.

## 2014-06-01 ENCOUNTER — Telehealth: Payer: Self-pay | Admitting: Family Medicine

## 2014-06-01 NOTE — Telephone Encounter (Signed)
Pt is asking for advice as to what OTC med can be taken for cold symptoms or if a rx can be sent to pharmacy for this.  Wish to have provider contact to advise.

## 2014-06-04 ENCOUNTER — Emergency Department (HOSPITAL_COMMUNITY)
Admission: EM | Admit: 2014-06-04 | Discharge: 2014-06-04 | Disposition: A | Discharge status: Home / Self Care | Payer: Self-pay | Attending: Emergency Medicine | Admitting: Emergency Medicine

## 2014-06-04 ENCOUNTER — Emergency Department (HOSPITAL_COMMUNITY): Payer: Self-pay

## 2014-06-04 ENCOUNTER — Encounter (HOSPITAL_COMMUNITY): Payer: Self-pay | Admitting: Emergency Medicine

## 2014-06-04 DIAGNOSIS — Z8669 Personal history of other diseases of the nervous system and sense organs: Secondary | ICD-10-CM | POA: Insufficient documentation

## 2014-06-04 DIAGNOSIS — Z8739 Personal history of other diseases of the musculoskeletal system and connective tissue: Secondary | ICD-10-CM | POA: Insufficient documentation

## 2014-06-04 DIAGNOSIS — J040 Acute laryngitis: Secondary | ICD-10-CM | POA: Insufficient documentation

## 2014-06-04 LAB — BASIC METABOLIC PANEL
Anion gap: 7 (ref 5–15)
BUN: 16 mg/dL (ref 6–23)
CALCIUM: 8.9 mg/dL (ref 8.4–10.5)
CHLORIDE: 105 mmol/L (ref 96–112)
CO2: 30 mmol/L (ref 19–32)
Creatinine, Ser: 0.76 mg/dL (ref 0.50–1.10)
GFR calc non Af Amer: >90 mL/min (ref >90)
Glucose, Bld: 100 mg/dL — ABNORMAL HIGH (ref 70–99)
Potassium: 3.7 mmol/L (ref 3.5–5.1)
Sodium: 142 mmol/L (ref 135–145)

## 2014-06-04 LAB — CBC WITH DIFFERENTIAL/PLATELET
BASOS PCT: 0 % (ref 0–1)
Basophils Absolute: 0 10*3/uL (ref 0.0–0.1)
EOS PCT: 3 % (ref 0–5)
Eosinophils Absolute: 0.2 10*3/uL (ref 0.0–0.7)
HCT: 40.3 % (ref 36.0–46.0)
Hemoglobin: 13.6 g/dL (ref 12.0–15.0)
LYMPHS ABS: 2.3 10*3/uL (ref 0.7–4.0)
LYMPHS PCT: 31 % (ref 12–46)
MCH: 29.8 pg (ref 26.0–34.0)
MCHC: 33.7 g/dL (ref 30.0–36.0)
MCV: 88.2 fL (ref 78.0–100.0)
MONOS PCT: 7 % (ref 3–12)
Monocytes Absolute: 0.5 10*3/uL (ref 0.1–1.0)
NEUTROS ABS: 4.4 10*3/uL (ref 1.7–7.7)
Neutrophils Relative %: 59 % (ref 43–77)
PLATELETS: 172 10*3/uL (ref 150–400)
RBC: 4.57 MIL/uL (ref 3.87–5.11)
RDW: 14.8 % (ref 11.5–15.5)
WBC: 7.5 10*3/uL (ref 4.0–10.5)

## 2014-06-04 LAB — I-STAT TROPONIN, ED: TROPONIN I, POC: 0 ng/mL (ref 0.00–0.08)

## 2014-06-04 LAB — BRAIN NATRIURETIC PEPTIDE: B NATRIURETIC PEPTIDE 5: 36.7 pg/mL (ref 0.0–100.0)

## 2014-06-04 MED ORDER — IPRATROPIUM-ALBUTEROL 0.5-2.5 (3) MG/3ML IN SOLN
3.0000 mL | Freq: Once | RESPIRATORY_TRACT | Status: AC
  Administered 2014-06-04: 3 mL via RESPIRATORY_TRACT
  Filled 2014-06-04: qty 3

## 2014-06-04 MED ORDER — DEXAMETHASONE 4 MG PO TABS
12.0000 mg | ORAL_TABLET | Freq: Once | ORAL | Status: AC
  Administered 2014-06-04: 12 mg via ORAL
  Filled 2014-06-04: qty 3

## 2014-06-04 MED ORDER — GUAIFENESIN-CODEINE 100-10 MG/5ML PO SOLN: 5.0000 mL | Freq: Four times a day (QID) | ORAL | Status: DC | PRN

## 2014-06-04 MED ORDER — PREDNISONE 20 MG PO TABS: 40.0000 mg | ORAL_TABLET | Freq: Every day | ORAL | Status: DC

## 2014-06-04 NOTE — ED Notes (Signed)
Pt ambulated with pulse ox ranging between 92 and 95%

## 2014-06-04 NOTE — ED Provider Notes (Signed)
Patient seen/evaluated by previous team.  I was not involved in medical decision making  Joya Gaskins, MD 06/04/14 1736

## 2014-06-04 NOTE — ED Provider Notes (Signed)
CSN: [id number]     Arrival date & time 06/04/14  1329 History   First MD Initiated Contact with Patient 06/04/14 1350     Chief Complaint  Patient presents with  . Shortness of Breath  . Headache  . Cough     (Consider location/radiation/quality/duration/timing/severity/associated sxs/prior Treatment) HPI Comments: 59 y/o F with PMH of MS, HTN and chronic venous insufficiency presenting to the ED for evaluation of SOB and cough. Pt states she began having a mild non-productive cough on Tuesday and developed associated SOB which has been constant since that time, SOB is present at rest and does not worsen at night. She also has had a sore throat and noticed some hoarseness developing over the last several days. She does have bilateral lower leg swelling but pt states this is normal for her and has not worsened recently. Denies chest pain, fever, chills, nausea, vomiting.   Patient is a 59 y.o. female presenting with shortness of breath, headaches, and cough. The history is provided by the patient.  Shortness of Breath Associated symptoms: cough, headaches and sore throat   Headache Associated symptoms: cough and sore throat   Cough Associated symptoms: headaches, shortness of breath and sore throat     Past Medical History  Diagnosis Date  . Lumbar spondylosis   . Spinal stenosis   . Weakness   . Numbness   . Lower back pain   . Gout   . MS (multiple sclerosis)    Past Surgical History  Procedure Laterality Date  . Cervical disc surgery      Qusetions C5-6 C6-7 and C7-T1   Family History  Problem Relation Age of Onset  . Heart disease Mother   . Diabetes Mother   . Cancer Father    History  Substance Use Topics  . Smoking status: Never Smoker   . Smokeless tobacco: Never Used  . Alcohol Use: No   OB History    No data available     Review of Systems  Unable to perform ROS Constitutional: Negative.   HENT: Positive for sore throat and voice change. Negative for  trouble swallowing.   Eyes: Negative.   Respiratory: Positive for cough and shortness of breath.   Cardiovascular: Negative.   Gastrointestinal: Negative.   Endocrine: Negative.   Genitourinary: Negative.   Musculoskeletal: Negative.   Skin: Negative.   Neurological: Positive for headaches.  Hematological: Negative.       Allergies  Review of patient's allergies indicates no known allergies.  Home Medications   Prior to Admission medications   Medication Sig Start Date End Date Taking? Authorizing Provider  aspirin 81 MG tablet Take 81 mg by mouth daily.      Historical Provider, MD  calcium-vitamin D (OSCAL WITH D) 500-200 MG-UNIT per tablet Take 1 tablet by mouth daily.      Historical Provider, MD  colchicine 0.6 MG tablet Take by mouth.    Historical Provider, MD  diazepam (VALIUM) 5 MG tablet TAKE 1 TABLET BY MOUTH EVERY 6 HOURS AS NEEDED FOR LEG SPASMS 05/10/14   Sanjuana Letters, MD  fish oil-omega-3 fatty acids 1000 MG capsule Take 1 g by mouth daily.      Historical Provider, MD  gabapentin (NEURONTIN) 300 MG capsule Take 1 capsule (300 mg total) by mouth 2 (two) times daily. 05/24/14   Sanjuana Letters, MD  glucosamine-chondroitin 500-400 MG tablet Take 1 tablet by mouth 3 (three) times daily.    Historical Provider, MD  ibuprofen (ADVIL,MOTRIN) 200 MG tablet Take 600 mg by mouth 4 (four) times daily.    Historical Provider, MD  lisinopril (PRINIVIL,ZESTRIL) 10 MG tablet Take 1 tablet (10 mg total) by mouth daily. 07/02/13   Sanjuana Letters, MD  methocarbamol (ROBAXIN) 750 MG tablet TAKE 1 TABLET BY MOUTH 4 TIMES DAILY. 04/11/14   Sanjuana Letters, MD  Multiple Vitamin (MULTIVITAMIN) tablet Take 1 tablet by mouth daily.      Historical Provider, MD  predniSONE (DELTASONE) 20 MG tablet Take 1 tablet (20 mg total) by mouth 3 (three) times daily as needed. For MS flair 11/04/12   Sanjuana Letters, MD  traMADol (ULTRAM) 50 MG tablet TAKE 1 TABLET BY MOUTH 4  TIMES DAILY 05/16/14   Sanjuana Letters, MD   BP 155/83 mmHg  Pulse 73  Temp(Src) 98 F (36.7 C) (Oral)  Resp 18  SpO2 93%  LMP 10/07/2010 Physical Exam  Constitutional: She is oriented to person, place, and time. She appears well-developed and well-nourished. No distress.  HENT:  Head: Normocephalic and atraumatic.  Eyes: Conjunctivae are normal. No scleral icterus.  Neck: Normal range of motion.  Cardiovascular: Normal rate, regular rhythm and normal heart sounds.  Exam reveals no gallop and no friction rub.   No murmur heard. Bilateral peripheral edema, pitting, chronic venous stasis dermatitis  Pulmonary/Chest: Effort normal and breath sounds normal. No respiratory distress. She has no wheezes.  Patient with poor expiratory effort. Breath sounds are abnormal, but sound as if they are coming from her throat.  Abdominal: Soft. Bowel sounds are normal. She exhibits no distension and no mass. There is no tenderness. There is no guarding.  Neurological: She is alert and oriented to person, place, and time.  Skin: Skin is warm and dry. She is not diaphoretic.  Nursing note and vitals reviewed.   ED Course  Procedures (including critical care time) Labs Review Labs Reviewed  BASIC METABOLIC PANEL  CBC WITH DIFFERENTIAL/PLATELET  BRAIN NATRIURETIC PEPTIDE  I-STAT TROPOININ, ED    Imaging Review No results found.   EKG Interpretation None      MDM   Final diagnoses:  None    3:22 PM BP 155/83 mmHg  Pulse 73  Temp(Src) 98 F (36.7 C) (Oral)  Resp 18  SpO2 93%  LMP 10/07/2010 Patient here with complaint of shortness of breath. Her O2 saturations are in the low 90s. Unsure if this isn't her baseline or not. Her breathing abnormality seemed to be intermittent and when distracted, the patient seems to be able to breathe more easily and speak more easily. At times she is having difficulty getting through full sentences. She does not have any wheezes. She denies  orthopnea. Patient is predominantly immobile secondary to her MS.  Awainting Probnp and ambulation with pulse ox. I have given report to Dr. Bebe Shaggy who will assume care.  Arthor Captain, PA-C 06/07/14 1437  Elwin Mocha, MD 06/08/14 [phone]

## 2014-06-04 NOTE — ED Notes (Signed)
Pt. Stated, I've had a cold and its just getting worse and having SOB and I feel like I can't get a complete breath. I also have a headache and maybe that's from the medications.

## 2014-06-04 NOTE — ED Provider Notes (Signed)
Date: 06/04/2014 1430  Rate: 78  Rhythm: normal sinus rhythm  QRS Axis: left  Intervals: normal  ST/T Wave abnormalities: nonspecific ST changes  Conduction Disutrbances:none  Narrative Interpretation: significant artifact needs repeat    Joya Gaskins, MD 06/04/14 1506

## 2014-06-04 NOTE — Discharge Instructions (Signed)
Laryngitis At the top of your windpipe is your voice box. It is the source of your voice. Inside your voice box are 2 bands of muscles called vocal cords. When you breathe, your vocal cords are relaxed and open so that air can get into the lungs. When you decide to say something, these cords come together and vibrate. The sound from these vibrations goes into your throat and comes out through your mouth as sound. Laryngitis is an inflammation of the vocal cords that causes hoarseness, cough, loss of voice, sore throat, and dry throat. Laryngitis can be temporary (acute) or long-term (chronic). Most cases of acute laryngitis improve with time.Chronic laryngitis lasts for more than 3 weeks. CAUSES Laryngitis can often be related to excessive smoking, talking, or yelling, as well as inhalation of toxic fumes and allergies. Acute laryngitis is usually caused by a viral infection, vocal strain, measles or mumps, or bacterial infections. Chronic laryngitis is usually caused by vocal cord strain, vocal cord injury, postnasal drip, growths on the vocal cords, or acid reflux. SYMPTOMS   Cough.  Sore throat.  Dry throat. RISK FACTORS  Respiratory infections.  Exposure to irritating substances, such as cigarette smoke, excessive amounts of alcohol, stomach acids, and workplace chemicals.  Voice trauma, such as vocal cord injury from shouting or speaking too loud. DIAGNOSIS  Your cargiver will perform a physical exam. During the physical exam, your caregiver will examine your throat. The most common sign of laryngitis is hoarseness. Laryngoscopy may be necessary to confirm the diagnosis of this condition. This procedure allows your caregiver to look into the larynx. HOME CARE INSTRUCTIONS  Drink enough fluids to keep your urine clear or pale yellow.  Rest until you no longer have symptoms or as directed by your caregiver.  Breathe in moist air.  Take all medicine as directed by your  caregiver.  Do not smoke.  Talk as little as possible (this includes whispering).  Write on paper instead of talking until your voice is back to normal.  Follow up with your caregiver if your condition has not improved after 10 days. SEEK MEDICAL CARE IF:   You have trouble breathing.  You cough up blood.  You have persistent fever.  You have increasing pain.  You have difficulty swallowing. MAKE SURE YOU:  Understand these instructions.  Will watch your condition.  Will get help right away if you are not doing well or get worse. Document Released: 04/22/2005 Document Revised: 07/15/2011 Document Reviewed: 06/28/2010 ExitCare Patient Information 2015 [employer]. This information is not intended to replace advice given to you by your health care provider. Make sure you discuss any questions you have with your health care provider.  Upper Respiratory Infection, Adult An upper respiratory infection (URI) is also sometimes known as the common cold. The upper respiratory tract includes the nose, sinuses, throat, trachea, and bronchi. Bronchi are the airways leading to the lungs. Most people improve within 1 week, but symptoms can last up to 2 weeks. A residual cough may last even longer.  CAUSES Many different viruses can infect the tissues lining the upper respiratory tract. The tissues become irritated and inflamed and often become very moist. Mucus production is also common. A cold is contagious. You can easily spread the virus to others by oral contact. This includes kissing, sharing a glass, coughing, or sneezing. Touching your mouth or nose and then touching a surface, which is then touched by another person, can also spread the virus. SYMPTOMS  Symptoms   typically develop 1 to 3 days after you come in contact with a cold virus. Symptoms vary from person to person. They may include:  Runny nose.  Sneezing.  Nasal congestion.  Sinus irritation.  Sore throat.  Loss  of voice (laryngitis).  Cough.  Fatigue.  Muscle aches.  Loss of appetite.  Headache.  Low-grade fever. DIAGNOSIS  You might diagnose your own cold based on familiar symptoms, since most people get a cold 2 to 3 times a year. Your caregiver can confirm this based on your exam. Most importantly, your caregiver can check that your symptoms are not due to another disease such as strep throat, sinusitis, pneumonia, asthma, or epiglottitis. Blood tests, throat tests, and X-rays are not necessary to diagnose a common cold, but they may sometimes be helpful in excluding other more serious diseases. Your caregiver will decide if any further tests are required. RISKS AND COMPLICATIONS  You may be at risk for a more severe case of the common cold if you smoke cigarettes, have chronic heart disease (such as heart failure) or lung disease (such as asthma), or if you have a weakened immune system. The very young and very old are also at risk for more serious infections. Bacterial sinusitis, middle ear infections, and bacterial pneumonia can complicate the common cold. The common cold can worsen asthma and chronic obstructive pulmonary disease (COPD). Sometimes, these complications can require emergency medical care and may be life-threatening. PREVENTION  The best way to protect against getting a cold is to practice good hygiene. Avoid oral or hand contact with people with cold symptoms. Wash your hands often if contact occurs. There is no clear evidence that vitamin C, vitamin E, echinacea, or exercise reduces the chance of developing a cold. However, it is always recommended to get plenty of rest and practice good nutrition. TREATMENT  Treatment is directed at relieving symptoms. There is no cure. Antibiotics are not effective, because the infection is caused by a virus, not by bacteria. Treatment may include:  Increased fluid intake. Sports drinks offer valuable electrolytes, sugars, and  fluids.  Breathing heated mist or steam (vaporizer or shower).  Eating chicken soup or other clear broths, and maintaining good nutrition.  Getting plenty of rest.  Using gargles or lozenges for comfort.  Controlling fevers with ibuprofen or acetaminophen as directed by your caregiver.  Increasing usage of your inhaler if you have asthma. Zinc gel and zinc lozenges, taken in the first 24 hours of the common cold, can shorten the duration and lessen the severity of symptoms. Pain medicines may help with fever, muscle aches, and throat pain. A variety of non-prescription medicines are available to treat congestion and runny nose. Your caregiver can make recommendations and may suggest nasal or lung inhalers for other symptoms.  HOME CARE INSTRUCTIONS   Only take over-the-counter or prescription medicines for pain, discomfort, or fever as directed by your caregiver.  Use a warm mist humidifier or inhale steam from a shower to increase air moisture. This may keep secretions moist and make it easier to breathe.  Drink enough water and fluids to keep your urine clear or pale yellow.  Rest as needed.  Return to work when your temperature has returned to normal or as your caregiver advises. You may need to stay home longer to avoid infecting others. You can also use a face mask and careful hand washing to prevent spread of the virus. SEEK MEDICAL CARE IF:   After the first few days, you   feel you are getting worse rather than better.  You need your caregiver's advice about medicines to control symptoms.  You develop chills, worsening shortness of breath, or brown or red sputum. These may be signs of pneumonia.  You develop yellow or brown nasal discharge or pain in the face, especially when you bend forward. These may be signs of sinusitis.  You develop a fever, swollen neck glands, pain with swallowing, or white areas in the back of your throat. These may be signs of strep throat. SEEK  IMMEDIATE MEDICAL CARE IF:   You have a fever.  You develop severe or persistent headache, ear pain, sinus pain, or chest pain.  You develop wheezing, a prolonged cough, cough up blood, or have a change in your usual mucus (if you have chronic lung disease).  You develop sore muscles or a stiff neck. Document Released: 10/16/2000 Document Revised: 07/15/2011 Document Reviewed: 07/28/2013 ExitCare Patient Information 2015 [employer]. This information is not intended to replace advice given to you by your health care provider. Make sure you discuss any questions you have with your health care provider.  

## 2014-06-06 ENCOUNTER — Telehealth: Payer: Self-pay | Admitting: Family Medicine

## 2014-06-06 NOTE — Telephone Encounter (Signed)
Was in emergency room on Saturday because of breathing problems. Was told she had laryngitis. She is still having trouble breathing Please advise (during the call, she seemed to be struggling to breathe and talk at the same time)

## 2014-06-08 NOTE — Telephone Encounter (Signed)
Left voice message for pt to return nurse call.  Checking to see how pt was feeling.  Clovis Pu, RN

## 2014-06-09 NOTE — Telephone Encounter (Signed)
Left voice message to check how pt was feeling regarding breathing issues.  Pt to return nurse calls or call for an appt.  Clovis Pu, RN

## 2014-06-10 ENCOUNTER — Other Ambulatory Visit: Payer: Self-pay | Admitting: Family Medicine

## 2014-06-10 DIAGNOSIS — J45901 Unspecified asthma with (acute) exacerbation: Secondary | ICD-10-CM

## 2014-06-10 MED ORDER — ALBUTEROL SULFATE HFA 108 (90 BASE) MCG/ACT IN AERS: 2.0000 | INHALATION_SPRAY | RESPIRATORY_TRACT | Status: DC | PRN

## 2014-06-13 ENCOUNTER — Ambulatory Visit: Payer: Self-pay | Admitting: Family Medicine

## 2014-06-13 NOTE — Telephone Encounter (Signed)
Called.  Now symptomatically improving.

## 2014-06-13 NOTE — Telephone Encounter (Signed)
Late entry:  Pt returned nurse call 06/10/2014 regarding her breathing issue.  Pt stated she was not feeling any relief since ED visit.  Pt voice was still hoarse and pt stated she still felt SOB at times.  Pt stated she was given prednisone and cough medication at ED.  Pt has not taken cough medication because she is not coughing.  Pt took last dose of prednisone on Friday.  Pt denies any chest pain, dizziness or headache.  Precept with Dr. Perley Jain; ordered inhaler for pt to use until follow up appt 06/13/2014 at 2:45 PM.  Pt advised if inhaler didn't help or SOB worsen to go back to ED or urgent care over the weekend.  Pt stated understanding.  Clovis Pu, RN

## 2014-06-24 ENCOUNTER — Encounter: Payer: Self-pay | Admitting: Family Medicine

## 2014-06-27 NOTE — Telephone Encounter (Signed)
 -----   Message from [name] to Sanjuana Letters, MD sent at 06/24/2014 2:26 PM -----     I was wondering what your opinion might be of Indomethacin for gout treatment. I have another family member who also suffers from gout and speaks highly of its effectiveness. I currently take colycrs (sp?) and prednisone if it doesn't help. My gout attack is lasting 4 days and counting, so obviously I would love something else if it works quicker. He does not have the other issues of MS, etc. like me.        Let me say I am not questioning your decisions for my health/medications, I was just wondering if this medicine would work for me.        Have a wonderful weekend. Stay healthy and blessed.        Barbara [name]

## 2014-07-04 ENCOUNTER — Encounter: Payer: Self-pay | Admitting: Family Medicine

## 2014-07-14 ENCOUNTER — Other Ambulatory Visit: Payer: Self-pay | Admitting: Family Medicine

## 2014-07-20 ENCOUNTER — Encounter: Payer: Self-pay | Admitting: Family Medicine

## 2014-07-21 MED ORDER — DOXYCYCLINE HYCLATE 100 MG PO TABS: 100.0000 mg | ORAL_TABLET | Freq: Two times a day (BID) | ORAL | Status: DC

## 2014-07-21 NOTE — Telephone Encounter (Signed)
Called.  No fever.  Redness around the scratch.  Chronic leg swelling.  Will treat with empiric doxy.

## 2014-07-21 NOTE — Telephone Encounter (Signed)
-----   Message from [name] to Moses Manners, MD sent at 07/20/2014 4:59 PM -----      It's me again. I have an issue with my cellulitis. I got a scrape and unless the leg is wrapped, it is weeping - down my foot. I probably need to see you, but honestly I cannot afford it. The insurance issue is causing me to weep. LOL! I know that isn't your problem but if you can guide me without me coming in just yet, I would greatly appreciate it. If you cannot do this, I understand.        I promise to come in and get thoroughly checked as soon as I can.        Barbara [name]

## 2014-09-01 ENCOUNTER — Ambulatory Visit: Payer: Self-pay | Admitting: Diagnostic Neuroimaging

## 2014-10-11 ENCOUNTER — Telehealth: Payer: Self-pay | Admitting: Family Medicine

## 2014-10-11 NOTE — Telephone Encounter (Signed)
Pt says dr Leveda Anna has gone above and beyond in filling out insurance paper work for pt He has been so kind and helpful that she wants to make a donation to a charity that is near to his heart Please call pt back with a charity name

## 2014-10-13 ENCOUNTER — Encounter: Payer: Self-pay | Admitting: Family Medicine

## 2014-10-14 MED ORDER — DOXYCYCLINE HYCLATE 100 MG PO TABS: 100.0000 mg | ORAL_TABLET | Freq: Two times a day (BID) | ORAL | Status: DC

## 2014-10-14 NOTE — Telephone Encounter (Signed)
Refilled doxy due to cellulitis of leg.  Discussed ways to decrease recurrent abrasions  Requested that she donate to the charity of her choice.

## 2014-10-14 NOTE — Telephone Encounter (Signed)
Mrs. Hallgren's cellulitis as re appeared and she need an anitibiotic doxycycline 100 mg sent in to [facility].  Also need to inquire about the charity he would prefer she make a donation to in his honor.  Please call her back about the latter so she can send that in.

## 2014-10-14 NOTE — Telephone Encounter (Signed)
Requested that she donate to the charity of her choice.  Thanked her for the lovely complement.

## 2014-11-08 ENCOUNTER — Other Ambulatory Visit: Payer: Self-pay | Admitting: Family Medicine

## 2014-12-07 ENCOUNTER — Encounter: Payer: Self-pay | Admitting: Family Medicine

## 2014-12-10 ENCOUNTER — Encounter: Payer: Self-pay | Admitting: Diagnostic Neuroimaging

## 2014-12-10 DIAGNOSIS — G35 Multiple sclerosis: Secondary | ICD-10-CM

## 2014-12-12 ENCOUNTER — Other Ambulatory Visit: Payer: Self-pay | Admitting: Family Medicine

## 2014-12-12 MED ORDER — METHOCARBAMOL 750 MG PO TABS: 1500.0000 mg | ORAL_TABLET | Freq: Three times a day (TID) | ORAL | Status: DC

## 2014-12-13 ENCOUNTER — Telehealth: Payer: Self-pay | Admitting: Diagnostic Neuroimaging

## 2014-12-13 NOTE — Telephone Encounter (Signed)
MRI order placed pre Dr Marjory Lies.  Left vm for pot on her mobile informing her that MRI has been ordered, and she will get call to schedule once insurance approves. Also informed her that DR [facility] agrees she should begin taking Copaxone again. Reminded her of FU in Nov but stated her FU could be rescheduled earlier if she would like. Left this caller's name, number.

## 2014-12-13 NOTE — Telephone Encounter (Signed)
Pt is returning your call re: should she do MRI prior to her next appointment 03/07/15? Message also said you were waiting insurance approval. She does not have insurance at this time. Will have Medicare effective 02/04/15.

## 2014-12-13 NOTE — Telephone Encounter (Signed)
Spoke with patient who stated her Medicare will be effective on 02/04/2015. Advised that she may wait to have MRI after that date, keep 03/07/15 FU with Dr Marjory Lies. Advised she tell MRI scheduler when she receives the call, to wait to schedule MRI after 02/04/15. She also states that she will not be able to resume Copaxone until Medicare is effective due to high cost for her at this time. Informed her this RN will let Dr Marjory Lies know about her situation. She verbalized understanding, appreciation.

## 2015-01-02 ENCOUNTER — Encounter: Payer: Self-pay | Admitting: Family Medicine

## 2015-01-02 ENCOUNTER — Other Ambulatory Visit: Payer: Self-pay | Admitting: Family Medicine

## 2015-01-03 MED ORDER — DOXYCYCLINE HYCLATE 100 MG PO TABS: 100.0000 mg | ORAL_TABLET | Freq: Two times a day (BID) | ORAL | Status: DC

## 2015-01-05 ENCOUNTER — Other Ambulatory Visit: Payer: Self-pay | Admitting: Family Medicine

## 2015-01-10 ENCOUNTER — Encounter: Payer: Self-pay | Admitting: Family Medicine

## 2015-01-23 ENCOUNTER — Encounter: Payer: Self-pay | Admitting: Family Medicine

## 2015-02-09 ENCOUNTER — Encounter: Payer: Self-pay | Admitting: Family Medicine

## 2015-02-09 ENCOUNTER — Ambulatory Visit (INDEPENDENT_AMBULATORY_CARE_PROVIDER_SITE_OTHER): Payer: PPO | Admitting: Family Medicine

## 2015-02-09 VITALS — BP 183/97 | HR 86 | Temp 98.2°F | Ht 64.0 in

## 2015-02-09 DIAGNOSIS — I10 Essential (primary) hypertension: Secondary | ICD-10-CM

## 2015-02-09 DIAGNOSIS — Z114 Encounter for screening for human immunodeficiency virus [HIV]: Secondary | ICD-10-CM | POA: Insufficient documentation

## 2015-02-09 DIAGNOSIS — M1040 Other secondary gout, unspecified site: Secondary | ICD-10-CM

## 2015-02-09 DIAGNOSIS — Z23 Encounter for immunization: Secondary | ICD-10-CM

## 2015-02-09 DIAGNOSIS — M48061 Spinal stenosis, lumbar region without neurogenic claudication: Secondary | ICD-10-CM

## 2015-02-09 DIAGNOSIS — M25551 Pain in right hip: Secondary | ICD-10-CM

## 2015-02-09 DIAGNOSIS — Z1159 Encounter for screening for other viral diseases: Secondary | ICD-10-CM | POA: Diagnosis not present

## 2015-02-09 DIAGNOSIS — M4806 Spinal stenosis, lumbar region: Secondary | ICD-10-CM

## 2015-02-09 DIAGNOSIS — G35 Multiple sclerosis: Secondary | ICD-10-CM

## 2015-02-09 DIAGNOSIS — M1A40X0 Other secondary chronic gout, unspecified site, without tophus (tophi): Secondary | ICD-10-CM

## 2015-02-09 DIAGNOSIS — I872 Venous insufficiency (chronic) (peripheral): Secondary | ICD-10-CM

## 2015-02-09 LAB — COMPREHENSIVE METABOLIC PANEL
ALBUMIN: 4 g/dL (ref 3.6–5.1)
ALT: 28 U/L (ref 6–29)
AST: 52 U/L — AB (ref 10–35)
Alkaline Phosphatase: 105 U/L (ref 33–130)
BILIRUBIN TOTAL: 0.6 mg/dL (ref 0.2–1.2)
BUN: 13 mg/dL (ref 7–25)
CO2: 34 mmol/L — ABNORMAL HIGH (ref 20–31)
Calcium: 9.2 mg/dL (ref 8.6–10.4)
Chloride: 102 mmol/L (ref 98–110)
Creat: 0.74 mg/dL (ref 0.50–1.05)
Glucose, Bld: 104 mg/dL — ABNORMAL HIGH (ref 65–99)
Potassium: 3.9 mmol/L (ref 3.5–5.3)
Sodium: 145 mmol/L (ref 135–146)
Total Protein: 7 g/dL (ref 6.1–8.1)

## 2015-02-09 LAB — CBC
HCT: 42.7 % (ref 36.0–46.0)
Hemoglobin: 14.2 g/dL (ref 12.0–15.0)
MCH: 29.1 pg (ref 26.0–34.0)
MCHC: 33.3 g/dL (ref 30.0–36.0)
MCV: 87.5 fL (ref 78.0–100.0)
MPV: 10.7 fL (ref 8.6–12.4)
PLATELETS: 182 10*3/uL (ref 150–400)
RBC: 4.88 MIL/uL (ref 3.87–5.11)
RDW: 15 % (ref 11.5–15.5)
WBC: 7.4 10*3/uL (ref 4.0–10.5)

## 2015-02-09 LAB — LIPID PANEL
CHOLESTEROL: 167 mg/dL (ref 125–200)
HDL: 23 mg/dL — ABNORMAL LOW (ref >=46)
LDL CALC: 101 mg/dL (ref <130)
Total CHOL/HDL Ratio: 7.3 Ratio — ABNORMAL HIGH (ref <=5.0)
Triglycerides: 216 mg/dL — ABNORMAL HIGH (ref <150)
VLDL: 43 mg/dL — ABNORMAL HIGH (ref <30)

## 2015-02-09 MED ORDER — FUROSEMIDE 20 MG PO TABS: 20.0000 mg | ORAL_TABLET | Freq: Every day | ORAL | Status: DC

## 2015-02-09 NOTE — Assessment & Plan Note (Signed)
She plans follow up with neuro.  I am concerned about frequent falls.  I will order PT

## 2015-02-09 NOTE — Progress Notes (Signed)
   Subjective:    Patient ID: Barbara [name], female    DOB: 02/11/1956, 59 y.o.   MRN: [id number]  HPI We are playing catch up today.  Barbara [name] has a hard time coming in for appointments because of her MS.  Also, she has not had insurance.  Now on Medicare.  Issues: 1. Main issue for me is her severe right hip pain.  She is limited on walking anyway and this hip pain definitely worsen ambulation.  She has had multiple recent falls, thankfully without injury.  Previous x rays showed mild DJD.  She also has known significant lumbar spine pathology.  Pain is positional located at hip and feels down into knee. 2. MS.  Managed by neuro.  Per patient, neuro plans MRI "from head to toe" to check the MS status.  I will let them order. 3. Hypertension - admits medical non compliance.  BP up today.   4. Chronic venous insufficiency and recurrent cellulitis of legs.  I have been managing with MyChart encounters. She is not good about elevating legs during the day. 5. HPDP Needs mammo and flu vaccine.  I am getting lipids, HIV and Hep C screen.  Given MS and Hip problems, I am not sure how I will ever do a pap on her. 6. Frequent falls.      Review of Systems     Objective:   Physical ExamAffect is good.  She faces her illness with fortitude. Lungs clear Cardiac RRR without m or g Hip right difficult to examine Has pain on internal and external rotation Legs bilateral edema 2-3+ with venous stasis changes.  No active cellulitis.        Assessment & Plan:

## 2015-02-09 NOTE — Assessment & Plan Note (Signed)
Screen x 1 

## 2015-02-09 NOTE — Assessment & Plan Note (Signed)
Add lasix due to bilateral edema.  Also encourage med compliance.

## 2015-02-09 NOTE — Assessment & Plan Note (Signed)
Add lasix in hopes of decreasing symptoms and episodes of cellulitis.

## 2015-02-09 NOTE — Assessment & Plan Note (Signed)
Chronic and stable.  This is a less likely etiology of her right hip pain.

## 2015-02-09 NOTE — Patient Instructions (Addendum)
Get your mammogram You will get a flu shot today. I have ordered the MRI of your hip.  Wait to schedule until we know what MRI neuro wants.  It would be great if he wants an MRI of the lumbar spine for your multiple sclerosis. I will call with blood test results. Physical therapy should contact you. Your blood pressure is up today.  The lasix should help.

## 2015-02-09 NOTE — Assessment & Plan Note (Signed)
Lasix will increase the risk of flair.  No recent episodes.

## 2015-02-10 LAB — TSH: TSH: 3.577 u[IU]/mL (ref 0.350–4.500)

## 2015-02-10 LAB — HIV ANTIBODY (ROUTINE TESTING W REFLEX): HIV: NONREACTIVE

## 2015-02-10 LAB — HEPATITIS C ANTIBODY: HCV AB: NEGATIVE

## 2015-02-15 ENCOUNTER — Telehealth: Payer: Self-pay | Admitting: Diagnostic Neuroimaging

## 2015-02-15 NOTE — Telephone Encounter (Signed)
[name] ordered MRI. They can pre cert that scan. We will take care of brain MRI. Then imaging center can coordinate scheduling. -VRP

## 2015-02-15 NOTE — Telephone Encounter (Signed)
Patient called to advised she saw [name] 02/09/15. Dr. Marjory Lies was referring patient for MRI (patient has not heard from anyone to schedule this) and states [name] feels patient needs MRI of hip due to pain x 1 year. Has done 3 cortisone shots that didn't help. Feels like patient may need hip surgery. Feels it would be best to coordinate the 2 (would like for Dr. Marjory Lies to take care of coordinating this) for patient convenience and since it's hard for patient to maneuver. Please call patient [phone].

## 2015-02-16 ENCOUNTER — Telehealth: Payer: Self-pay | Admitting: Family Medicine

## 2015-02-16 DIAGNOSIS — M25551 Pain in right hip: Secondary | ICD-10-CM

## 2015-02-16 DIAGNOSIS — M48061 Spinal stenosis, lumbar region without neurogenic claudication: Secondary | ICD-10-CM

## 2015-02-16 NOTE — Telephone Encounter (Signed)
Barbara [name] need an order to be sent to [facility] for a lumbar spine and hip MRI. Per Dr. Marjory Lies.  She will have these done at [facility].

## 2015-02-16 NOTE — Telephone Encounter (Signed)
Will forward to PCP for review for order to [facility] for lumbar spine and hip MRI per Dr.Penumalli. Amarilis Belflower, CMA.

## 2015-02-16 NOTE — Telephone Encounter (Signed)
I spoke with patient and advised her that I would obtain with for her [employer] insurance and that I will be sending the order to [facility] I also advised her that she should have her PCP send the MRI HIP to [facility]. Patient requested to have MRI of her lumbar spine. She is not sure if her PCP has to order it or if Dr. Marjory Lies can order this as well. Please call and advise.

## 2015-02-16 NOTE — Telephone Encounter (Signed)
Spoke with patient and informed her that Dr Marjory Lies follows and treats her for her MS, and therefore he orders her MRI of brain. She states she has already called Dr Cyndia Skeeters office and requested he order MRI lumbar spine. She states that she is scheduled for MRI brain on 03/05/15, and [facility] informed her she cannot have all three scans done in same day. She states she will wait to have MRI hip and L spine at a later date. She confirmed her FU with Dr Marjory Lies on 03/07/15. She verbalized understanding of call and appreciation.

## 2015-02-16 NOTE — Addendum Note (Signed)
Addended by: Moses Manners on: 02/16/2015 10:20 AM   Modules accepted: Orders

## 2015-02-16 NOTE — Telephone Encounter (Signed)
done

## 2015-03-05 ENCOUNTER — Ambulatory Visit
Admission: RE | Admit: 2015-03-05 | Discharge: 2015-03-05 | Disposition: A | Discharge status: Home / Self Care | Payer: PPO | Source: Ambulatory Visit | Attending: Diagnostic Neuroimaging | Admitting: Diagnostic Neuroimaging

## 2015-03-05 DIAGNOSIS — G35 Multiple sclerosis: Secondary | ICD-10-CM

## 2015-03-05 MED ORDER — GADOBENATE DIMEGLUMINE 529 MG/ML IV SOLN: 20.0000 mL | Freq: Once | INTRAVENOUS | Status: DC | PRN

## 2015-03-05 MED ORDER — GADOBENATE DIMEGLUMINE 529 MG/ML IV SOLN
20.0000 mL | Freq: Once | INTRAVENOUS | Status: AC | PRN
  Administered 2015-03-05: 20 mL via INTRAVENOUS

## 2015-03-07 ENCOUNTER — Ambulatory Visit (INDEPENDENT_AMBULATORY_CARE_PROVIDER_SITE_OTHER): Payer: PPO | Admitting: Diagnostic Neuroimaging

## 2015-03-07 ENCOUNTER — Encounter: Payer: Self-pay | Admitting: Diagnostic Neuroimaging

## 2015-03-07 VITALS — BP 169/87 | HR 91

## 2015-03-07 DIAGNOSIS — M4806 Spinal stenosis, lumbar region: Secondary | ICD-10-CM

## 2015-03-07 DIAGNOSIS — N3946 Mixed incontinence: Secondary | ICD-10-CM | POA: Diagnosis not present

## 2015-03-07 DIAGNOSIS — G35 Multiple sclerosis: Secondary | ICD-10-CM

## 2015-03-07 DIAGNOSIS — M48061 Spinal stenosis, lumbar region without neurogenic claudication: Secondary | ICD-10-CM

## 2015-03-07 MED ORDER — OXYBUTYNIN CHLORIDE ER 5 MG PO TB24: 5.0000 mg | ORAL_TABLET | Freq: Every day | ORAL | Status: DC

## 2015-03-07 NOTE — Patient Instructions (Addendum)
Thank you for coming to see [place] at [facility]. I hope we have been able to provide you high quality care today.  You may receive a patient satisfaction survey over the next few weeks. We would appreciate your feedback and comments so that we may continue to improve ourselves and the health of our patients.  - check JCV antibody testing; then consider tysabri, tecfidera or copaxone/glatopa  - refer to ophthalmology for right optic neuritis follow up and vision testing  - refer to urology for bladder incontinence - trial of oxybutynin 79m at bedtime   ~~~~~~~~~~~~~~~~~~~~~~~~~~~~~~~~~~~~~~~~~~~~~~~~~~~~~~~~~~~~~~~~~  DR. PENUMALLI'S GUIDE TO HAPPY AND HEALTHY LIVING These are some of my general health and wellness recommendations. Some of them may apply to you better than others. Please use common sense as you try these suggestions and feel free to ask me any questions.   ACTIVITY/FITNESS Mental, social, emotional and physical stimulation are very important for brain and body health. Try learning a new activity (arts, music, language, sports, games).  Keep moving your body to the best of your abilities. You can do this at home, inside or outside, the park, community center, gym or anywhere you like. Consider a physical therapist or personal trainer to get started.    NUTRITION Eat more plants: colorful vegetables, nuts, seeds and berries.  Eat less sugar, salt, preservatives and processed foods.  Avoid toxins such as cigarettes and alcohol.  Drink water when you are thirsty. Warm water with a slice of lemon is an excellent morning drink to start the day.  Consider these websites for more information The Nutrition Source (hhttps://www.henry-hernandez.biz/ Precision Nutrition (wWindowBlog.ch   RELAXATION Consider practicing mindfulness meditation or other relaxation techniques such as deep breathing, prayer, yoga, tai chi,  massage. See website mindful.org or the apps Headspace or Calm to help get started.   SLEEP Try to get at least 7-8+ hours sleep per day. Regular exercise and reduced caffeine will help you sleep better. Practice good sleep hygeine techniques. See website sleep.org for more information.   PLANNING Prepare estate planning, living will, healthcare POA documents. Sometimes this is best planned with the help of an attorney. Theconversationproject.org and agingwithdignity.org are excellent resources.

## 2015-03-07 NOTE — Progress Notes (Signed)
[facility]  PATIENT: Barbara [name] DOB: 11-Aug-1955  REFERRING CLINICIAN:  HISTORY FROM: patient and sister  REASON FOR VISIT: follow up   HISTORICAL  CHIEF COMPLAINT:  Chief Complaint  Patient presents with  . Multiple sclerosis    rm 7, sister-carolyn  . Follow-up    1 year    HISTORY OF PRESENT ILLNESS:   UPDATE 03/07/15 (VRP): Since last visit, continues to have difficulty walking. Had continued left leg weakness. More falls. Now in wheelchair x 1 month. Still off DMTs for MS due to insurance / financial issues.   UPDATE 03/08/14 (LL): Since last visit patient was approved for SSI disability. She is not eligible for Medicare Until April 2016. She had health insurance briefly with Tedd Sias, but Copaxone was still going to be $3000/month so she has been without any DMT since earlier this year. She states her long term disability from her job was discontinued because [employer] did not receive the required documentation from our office. We have fax confirmation that a [employer] Form" was completed and faxed on 05/25/13. She comes in today to see if there is any financial assistance from any drug companies to get back on treatment. Her mobility is poor but she is able to walk with a rolling walker. Her last fall was in May without injury. She thinks she had an MS exacerbation in August, she felt all-over numbness. [name] put her on a steroid taper and she felt better.  UPDATE 03/19/13: Since last visit patient had lumbar decompression and fusion surgery at [facility] in May 2014. Following this she had significant difficulty walking and increased urinary incontinence. Patient followed up with her surgeon, who felt that the symptoms were more related to MS exacerbations rather than her of lumbar spine stenosis and surgery. Unfortunately patient does not have any insurance and is not able to afford her Copaxone. She has had  injections left from when she was in the nursing home for rehabilitation, and now she is taking the injections every other day to stretch them out. She's in the process of applying for Medicare and disability, and has some lawyers working on her behalf to pursue this.  PRIOR HPI (07/02/12, Dr. Sandria Manly): 59 year old left-handed white widowed female with a 5 year history of a tendency to fall forward while walking. Occasionally she will have retropulsion. She feels like "I'm going to fall". She had fallen numerous times and is uses a walker. Falls assessment tool score was 17. She has weakness in her left foot and leg more than the right. She has lower back pain and was seen by Dr. Newell Coral with MRI study of the lumbar spine without contrast 11/10/2008 showing spondylosis with moderately severe central canal and lateral recess stenosis of L4-5 and at L2-3 and L3-4. She has a three-year history of bladder symptoms with urinary incontinence. She does not have pain radiating into her legs. While walking she feels her legs begin to get weak and she falls forward. She has undergone epidural steroid injections by Dr. Alveda Reasons without benefit. She describes episodes in which her legs "jump" and she has no control. She says her legs are like "thumper". This occurs when she is getting into the shower.She has neck pain but none radiating into her arms. At times her left and right hand feel numb. At one point she was thought to have carpal tunnel syndrome. She denies Lhermitte's sign.05/16/2010 she noticed that her right eyelid was sore. 05/18/2010 she  could not see in the right eye. She went to an optometrist. She was seen 05/23/2010 by Dr. Jethro Bolus and referred to [facility] who noted edema and a swollen disc on the right with NLP vision from optic neuritis. MRI study of the brain and cervical spine without and with contrast 05/23/10 showed focal T2 hyperintensity in the right optic nerve, one adjacent to the left  lateral ventricle, and 3 discrete foci of T2 hyperintensity within the spinal cord. There was moderate central stenosis at C5-6 secondary to right paracentral disc protrusion and cord signal abnormality at C5-6 ,possibly secondary to the disc disease present. She began 1000 mg of IV Solu-Medrol daily for 3 days 05/25/2010 without improvement in NLP vision. Sjogren's antibodies, NMO IgG auto antibodies, ACE, ANA,sedimentation rate, B12, and Lyme titer were negative.CSF 06/15/2010 revealed 6 WBC, 0RBC, protein 66, glucose 85, VDRL NR, greater than 5 oligoclonal bands, CSF IgG 6.4 and normal ACE. She was on Betaseron 06/2010 and noticed the next day her legs didn't move as well. She was titrated to full dose with flu like symptoms, leg cramps, episodes of crying, and decreased urination. She discontinued Betaseron with much less falling and resolution of Flu symptoms.Cevical MRI without and with contrast 09/19/2010 showed increased cord signal in the cervical spine at C6-7 suggestive of progressive disease and other cord signal abnormality at C2-3 and C4 which was stable. MRI of the lumbar spine 09/19/2010 showed normal signal in the medullaris and multifocal spondylosis with right lateral recess and foraminal stenosis at L2-L3 and L3-4 and left greater than right lateral recess stenosis of L4- L5 She has severe lower back pain. Lumbar MRI 10/16/013 showed progressive disease. She started Copaxone 03/23/11 with improvement in symptoms. She has back pain down the left greater than right leg, worse with walking and activity.She underwent C5-6 C6-7 and C7-T1 diskectomy and fusion with plate and screws 06/14/2011 by Dr. Timoteo Ace She has done well postoperatively, but no improvement in numbness in her hands and feet. Dr. Timoteo Ace is treating her lumbar spinal stenosis with epidural steroids, acupuncture, and then consideration of surgery. She walked 10 feet without a walker and fell 02/24/12. At [facility] CT of the head  and cervical spine and lumbarspine x-rays showed no acute intracranial abnormality, cervical fusion, and DJD in the lumbar spine. Back pain increases with walking. [facility] OT felt she is disabled. Pain is 3-7/10 treated with tramadol and ibuprofen She has numbness left greater than right hand, dropping things.  REVIEW OF SYSTEMS: Full 14 system review of systems performed and notable only for bladder incontinence. Urination weakness joint pain back pain muscle grams walking difficulty leg swelling fatigue.  ALLERGIES: No Known Allergies  HOME MEDICATIONS: Outpatient Prescriptions Prior to Visit  Medication Sig Dispense Refill  . albuterol (PROVENTIL HFA;VENTOLIN HFA) 108 (90 BASE) MCG/ACT inhaler Inhale 2 puffs into the lungs every 4 (four) hours as needed for wheezing or shortness of breath. 1 Inhaler 0  . aspirin EC 325 MG tablet Take 325 mg by mouth daily.    . calcium-vitamin D (OSCAL WITH D) 500-200 MG-UNIT per tablet Take 1 tablet by mouth daily.      . COCONUT OIL PO Take 1 tablet by mouth daily.    . Coenzyme Q10 (CO Q 10 PO) Take 1 tablet by mouth daily.    . colchicine 0.6 MG tablet Take 0.6 mg by mouth daily as needed (gout).     . diazepam (VALIUM) 5 MG tablet TAKE 1 TABLET  BY MOUTH EVERY 6 HOURS AS NEEDED FOR LEG SPASMS 90 tablet 5  . doxycycline (VIBRA-TABS) 100 MG tablet Take 1 tablet (100 mg total) by mouth 2 (two) times daily. 20 tablet 0  . furosemide (LASIX) 20 MG tablet Take 1 tablet (20 mg total) by mouth daily. 30 tablet 3  . gabapentin (NEURONTIN) 300 MG capsule Take 1 capsule (300 mg total) by mouth 2 (two) times daily. 90 capsule 12  . guaiFENesin-codeine 100-10 MG/5ML syrup Take 5-10 mLs by mouth every 6 (six) hours as needed for cough. 120 mL 0  . ibuprofen (ADVIL,MOTRIN) 200 MG tablet Take 600 mg by mouth 4 (four) times daily.    Marland Kitchen lisinopril (PRINIVIL,ZESTRIL) 10 MG tablet TAKE 1 TABLET BY MOUTH DAILY. 90 tablet 3  . methocarbamol (ROBAXIN) 750 MG tablet Take 2  tablets (1,500 mg total) by mouth 3 (three) times daily. 540 tablet 3  . Multiple Vitamin (MULTIVITAMIN) tablet Take 1 tablet by mouth daily.      . predniSONE (DELTASONE) 20 MG tablet TAKE 1 TABLET BY MOUTH 3 TIMES DAILY AS NEEDED FOR MS FLARE 45 tablet 3  . traMADol (ULTRAM) 50 MG tablet TAKE 1 TABLET BY MOUTH 4 TIMES DAILY 360 tablet 1   No facility-administered medications prior to visit.    PAST MEDICAL HISTORY: Past Medical History  Diagnosis Date  . Lumbar spondylosis   . Spinal stenosis   . Weakness   . Numbness   . Lower back pain   . Gout   . MS (multiple sclerosis) (HCC)     PAST SURGICAL HISTORY: Past Surgical History  Procedure Laterality Date  . Cervical disc surgery      Qusetions C5-6 C6-7 and C7-T1    FAMILY HISTORY: Family History  Problem Relation Age of Onset  . Heart disease Mother   . Diabetes Mother   . Cancer Father     SOCIAL HISTORY:  Social History   Social History  . Marital Status: Widowed    Spouse Name: N/A  . Number of Children: 0  . Years of Education: college   Occupational History  .  [employer]   Social History Main Topics  . Smoking status: Never Smoker   . Smokeless tobacco: Never Used  . Alcohol Use: No  . Drug Use: No  . Sexual Activity: Not Currently     Comment: husband died November 20, 2010   Other Topics Concern  . Not on file   Social History Narrative   Patient lives at home alone.   Caffeine Use: 1 cup daily     PHYSICAL EXAM   GENERAL EXAM/CONSTITUTIONAL: Vitals:  Filed Vitals:   03/07/15 1313 03/07/15 1316  BP: 151/108 169/87  Pulse: 90 91     There is no weight on file to calculate BMI.  No exam data present  Patient is in no distress; well developed, nourished and groomed; neck is supple  CARDIOVASCULAR:  Examination of carotid arteries is normal; no carotid bruits  Regular rate and rhythm, no murmurs  Examination of peripheral vascular system by observation and palpation is  normal  EYES:  Ophthalmoscopic exam of optic discs and posterior segments is normal; no papilledema or hemorrhages  MUSCULOSKELETAL:  Gait, strength, tone, movements noted in Neurologic exam below  NEUROLOGIC: MENTAL STATUS:  No flowsheet data found.  awake, alert, oriented to person, place and time  recent and remote memory intact  normal attention and concentration  language fluent, comprehension intact, naming intact,   fund  of knowledge appropriate  CRANIAL NERVE:   2nd - no papilledema on fundoscopic exam  2nd, 3rd, 4th, 6th - pupils equal and reactive to light, visual fields full to confrontation, extraocular muscles intact, no nystagmus  5th - facial sensation symmetric  7th - facial strength symmetric  8th - hearing intact  9th - palate elevates symmetrically, uvula midline  11th - shoulder shrug symmetric  12th - tongue protrusion midline  MOTOR:   normal bulk and tone, full strength in the BUE; BLE HF 2, KE 2, DF 2; LEFT LEG WEAKER THAN RIGHT LEG; INCREASED TONE IN BLE  SENSORY:   normal and symmetric to light touch  COORDINATION:   finger-nose-finger, fine finger movements SLOW  REFLEXES:   deep tendon reflexes TRACE and symmetric  GAIT/STATION:   IN WHEEL CHAIR    DIAGNOSTIC DATA (LABS, IMAGING, TESTING) - I reviewed patient records, labs, notes, testing and imaging myself where available.  Lab Results  Component Value Date   WBC 7.4 02/09/2015   HGB 14.2 02/09/2015   HCT 42.7 02/09/2015   MCV 87.5 02/09/2015   PLT 182 02/09/2015      Component Value Date/Time   NA 145 02/09/2015 1211   K 3.9 02/09/2015 1211   CL 102 02/09/2015 1211   CO2 34* 02/09/2015 1211   GLUCOSE 104* 02/09/2015 1211   BUN 13 02/09/2015 1211   CREATININE 0.74 02/09/2015 1211   CREATININE 0.76 06/04/2014 1415   CALCIUM 9.2 02/09/2015 1211   PROT 7.0 02/09/2015 1211   ALBUMIN 4.0 02/09/2015 1211   AST 52* 02/09/2015 1211   ALT 28 02/09/2015 1211    ALKPHOS 105 02/09/2015 1211   BILITOT 0.6 02/09/2015 1211   GFRNONAA >90 06/04/2014 1415   GFRNONAA 80 11/01/2011 1116   GFRAA >90 06/04/2014 1415   GFRAA >89 11/01/2011 1116   Lab Results  Component Value Date   CHOL 167 02/09/2015   HDL 23* 02/09/2015   LDLCALC 101 02/09/2015   TRIG 216* 02/09/2015   CHOLHDL 7.3* 02/09/2015   No results found for: HGBA1C No results found for: VITAMINB12 Lab Results  Component Value Date   TSH 3.577 02/09/2015    03/05/15 MRI brain (with and without) demonstrating: 1. Small left lateral ventricle periventricular chronic demyelinating plaque.  2. No acute plaques. 3. Compared to MRI on 05/23/10, no new plaques are seen and prior right optic nerve enhancement has resolved.     ASSESSMENT AND PLAN  59 y.o. year old female here with multiple sclerosis. Some progression of gait difficulties, bladder incontinence. Could be MS related (i.e. New spinal cord lesions) + lumbar spine disease.    Dx:  Multiple sclerosis (HCC) - Plan: Stratify JCV Antibody Test ([facility])  Spinal stenosis of lumbar region - Plan: Stratify JCV Antibody Test ([facility])     PLAN: - check JCV ab - then consider tysabri, tecfidera or copaxone/glatopa - refer to ophthalmology for right optic neuritis follow up and vision testing - trial of oxybutynin   Orders Placed This Encounter  Procedures  . Stratify JCV Antibody Test ([facility])  . Ambulatory referral to Urology  . Ambulatory referral to Ophthalmology   Meds ordered this encounter  Medications  . oxybutynin (DITROPAN-XL) 5 MG 24 hr tablet    Sig: Take 1 tablet (5 mg total) by mouth at bedtime.    Dispense:  30 tablet    Refill:  6   Return in about 3 months (around 06/07/2015).    Suanne Marker, MD  03/07/2015, 1:35 PM Certified in Neurology, Neurophysiology and Neuroimaging  [facility] [address] [place], [place] [address] [phone]

## 2015-04-18 ENCOUNTER — Encounter: Payer: Self-pay | Admitting: Diagnostic Neuroimaging

## 2015-04-18 ENCOUNTER — Encounter: Payer: Self-pay | Admitting: Family Medicine

## 2015-04-19 NOTE — Telephone Encounter (Signed)
I was out of the office yesterday.  Last OV note says:  PLAN: - check JCV ab - then consider tysabri, tecfidera or copaxone/glatopa I called the patient.  Got no answer.  Left message.

## 2015-04-24 ENCOUNTER — Telehealth: Payer: Self-pay

## 2015-04-24 NOTE — Telephone Encounter (Signed)
I called back and spoke with the patient.  Relayed providers message.  She expressed understanding and appreciation.

## 2015-04-24 NOTE — Telephone Encounter (Signed)
Patient has been in contact with Assistance Program for Copaxone.  She would like to restart this medication if provider is agreeable.  Please advise.  Thank you!

## 2015-04-24 NOTE — Telephone Encounter (Signed)
Yes, ok to restart. -VRP

## 2015-04-25 ENCOUNTER — Other Ambulatory Visit: Payer: Self-pay | Admitting: Family Medicine

## 2015-04-25 MED ORDER — TRAMADOL HCL 50 MG PO TABS: ORAL_TABLET | ORAL | Status: DC

## 2015-04-25 NOTE — Telephone Encounter (Addendum)
LVM for pt to call the office to inform her that Dr Jennette Kettle approved a 3 month refill to the pharmacy.  Sunday Spillers, CMA

## 2015-04-25 NOTE — Telephone Encounter (Deleted)
Called pt to inform her that Dr. Jennette Kettle sent it

## 2015-04-25 NOTE — Progress Notes (Unsigned)
Covering inbox for [name] Reviewed chart rx refilled for 3 months  Dear [facility] Team Can u call this in? THANKS! Denny Levy

## 2015-05-04 ENCOUNTER — Other Ambulatory Visit: Payer: Self-pay | Admitting: Family Medicine

## 2015-05-05 ENCOUNTER — Ambulatory Visit
Admission: RE | Admit: 2015-05-05 | Discharge: 2015-05-05 | Disposition: A | Discharge status: Home / Self Care | Payer: PPO | Source: Ambulatory Visit | Attending: Family Medicine | Admitting: Family Medicine

## 2015-05-05 ENCOUNTER — Telehealth: Payer: Self-pay | Admitting: Family Medicine

## 2015-05-05 DIAGNOSIS — M25551 Pain in right hip: Secondary | ICD-10-CM

## 2015-05-05 DIAGNOSIS — M48061 Spinal stenosis, lumbar region without neurogenic claudication: Secondary | ICD-10-CM

## 2015-05-05 DIAGNOSIS — M25571 Pain in right ankle and joints of right foot: Secondary | ICD-10-CM

## 2015-05-05 NOTE — Telephone Encounter (Signed)
Ordered and patient informed. 

## 2015-05-05 NOTE — Telephone Encounter (Signed)
Patient having a lot of pain in her Right ankle. Cannot bear weight on it (thought to be gout). Patient has a MRI scheduled at [facility] today at 2:40 and would like to know if [name] would be willing order an ankle x-ray so she can get it all done at the same time. Please advise.

## 2015-05-09 ENCOUNTER — Encounter: Payer: Self-pay | Admitting: Family Medicine

## 2015-05-14 ENCOUNTER — Encounter: Payer: Self-pay | Admitting: Diagnostic Neuroimaging

## 2015-05-16 ENCOUNTER — Telehealth: Payer: Self-pay | Admitting: *Deleted

## 2015-05-16 NOTE — Telephone Encounter (Signed)
Spoke w/patient and informed her to call Patient [employer] to see if they are able to provide her with any assistance or a grant, gave her their number  [phone]. Informed her that this RN has not discussed her FU w/Dr [facility] but will call her with his response. Requested she call back if unable to get assistance with medication. She verbalized understanding, appreciation for call.

## 2015-05-17 NOTE — Telephone Encounter (Signed)
Spoke with patient and rescheduled her FU for 07/20/15 per Dr Marjory Lies. She will be seen at later FU date due to not beginning new medication until possibly early March. Patient verbalized understanding of new appt date.

## 2015-05-19 ENCOUNTER — Encounter: Payer: Self-pay | Admitting: Diagnostic Neuroimaging

## 2015-06-01 ENCOUNTER — Encounter: Payer: Self-pay | Admitting: *Deleted

## 2015-06-02 MED FILL — LISINOPRIL 10 MG TABLET: 10 | 30 days supply | Qty: 30 | Fill #3

## 2015-06-02 MED FILL — OXYBUTYNIN CL ER 5 MG TAB: 5 | 30 days supply | Qty: 30 | Fill #3

## 2015-06-02 MED FILL — METHOCARBAMOL 750 MG TABLET: 750 | 30 days supply | Qty: 180 | Fill #6

## 2015-06-02 MED FILL — predniSONE 20 MG TABS: 20 | 15 days supply | Qty: 45 | Fill #2

## 2015-06-07 ENCOUNTER — Encounter (INDEPENDENT_AMBULATORY_CARE_PROVIDER_SITE_OTHER): Payer: PPO | Admitting: Ophthalmology

## 2015-06-08 ENCOUNTER — Ambulatory Visit: Payer: PPO | Admitting: Diagnostic Neuroimaging

## 2015-06-12 ENCOUNTER — Encounter: Payer: Self-pay | Admitting: *Deleted

## 2015-07-03 MED FILL — OXYBUTYNIN CL ER 5 MG TAB: 5 | 30 days supply | Qty: 30 | Fill #4

## 2015-07-03 MED FILL — diazePAM 5 MG TABS: 5 | 22 days supply | Qty: 90 | Fill #4

## 2015-07-03 MED FILL — METHOCARBAMOL 750 MG TABLET: 750 | 30 days supply | Qty: 180 | Fill #7

## 2015-07-10 ENCOUNTER — Telehealth: Payer: Self-pay | Admitting: Family Medicine

## 2015-07-10 NOTE — Telephone Encounter (Signed)
Pt has gout in ankle and unable to walk.  Has been using colycyss and predinzone since Friday. It hasnt touched it.  Needs something else [facility]

## 2015-07-10 NOTE — Telephone Encounter (Signed)
Called.  Taking prednisone, 20 mg tid, colchicine daily and ibuprofen 800mg .  She states that she is sure that this is the gout.  My two recommendations were 1. Increase cochicine to bid and 2. Patience.

## 2015-07-12 ENCOUNTER — Telehealth: Payer: Self-pay | Admitting: Diagnostic Neuroimaging

## 2015-07-12 ENCOUNTER — Encounter (HOSPITAL_COMMUNITY): Payer: Self-pay | Admitting: Emergency Medicine

## 2015-07-12 ENCOUNTER — Inpatient Hospital Stay (HOSPITAL_COMMUNITY)
Admission: EM | Admit: 2015-07-12 | Discharge: 2015-07-19 | DRG: 059 | Disposition: A | Discharge status: Home Health | Payer: PPO | Attending: Family Medicine | Admitting: Family Medicine

## 2015-07-12 DIAGNOSIS — G35 Multiple sclerosis: Principal | ICD-10-CM | POA: Diagnosis present

## 2015-07-12 DIAGNOSIS — I878 Other specified disorders of veins: Secondary | ICD-10-CM | POA: Diagnosis not present

## 2015-07-12 DIAGNOSIS — R29898 Other symptoms and signs involving the musculoskeletal system: Secondary | ICD-10-CM | POA: Diagnosis not present

## 2015-07-12 DIAGNOSIS — H5441 Blindness, right eye, normal vision left eye: Secondary | ICD-10-CM | POA: Diagnosis not present

## 2015-07-12 DIAGNOSIS — I10 Essential (primary) hypertension: Secondary | ICD-10-CM | POA: Diagnosis present

## 2015-07-12 DIAGNOSIS — M4806 Spinal stenosis, lumbar region: Secondary | ICD-10-CM | POA: Diagnosis not present

## 2015-07-12 DIAGNOSIS — IMO0002 Reserved for concepts with insufficient information to code with codable children: Secondary | ICD-10-CM | POA: Insufficient documentation

## 2015-07-12 DIAGNOSIS — Z79899 Other long term (current) drug therapy: Secondary | ICD-10-CM | POA: Diagnosis not present

## 2015-07-12 DIAGNOSIS — M4802 Spinal stenosis, cervical region: Secondary | ICD-10-CM | POA: Diagnosis present

## 2015-07-12 DIAGNOSIS — Z66 Do not resuscitate: Secondary | ICD-10-CM | POA: Diagnosis not present

## 2015-07-12 DIAGNOSIS — I872 Venous insufficiency (chronic) (peripheral): Secondary | ICD-10-CM | POA: Diagnosis present

## 2015-07-12 DIAGNOSIS — Z6841 Body Mass Index (BMI) 40.0 and over, adult: Secondary | ICD-10-CM | POA: Diagnosis not present

## 2015-07-12 DIAGNOSIS — E669 Obesity, unspecified: Secondary | ICD-10-CM | POA: Diagnosis present

## 2015-07-12 DIAGNOSIS — R404 Transient alteration of awareness: Secondary | ICD-10-CM | POA: Diagnosis not present

## 2015-07-12 DIAGNOSIS — R531 Weakness: Secondary | ICD-10-CM

## 2015-07-12 DIAGNOSIS — G8220 Paraplegia, unspecified: Secondary | ICD-10-CM | POA: Diagnosis present

## 2015-07-12 DIAGNOSIS — Z7982 Long term (current) use of aspirin: Secondary | ICD-10-CM

## 2015-07-12 DIAGNOSIS — E876 Hypokalemia: Secondary | ICD-10-CM | POA: Diagnosis not present

## 2015-07-12 DIAGNOSIS — R32 Unspecified urinary incontinence: Secondary | ICD-10-CM | POA: Insufficient documentation

## 2015-07-12 DIAGNOSIS — M1A9XX0 Chronic gout, unspecified, without tophus (tophi): Secondary | ICD-10-CM | POA: Diagnosis present

## 2015-07-12 DIAGNOSIS — M545 Low back pain: Secondary | ICD-10-CM | POA: Diagnosis not present

## 2015-07-12 DIAGNOSIS — N39498 Other specified urinary incontinence: Secondary | ICD-10-CM | POA: Diagnosis not present

## 2015-07-12 LAB — CBC WITH DIFFERENTIAL/PLATELET
BASOS PCT: 0 %
Basophils Absolute: 0 10*3/uL (ref 0.0–0.1)
Eosinophils Absolute: 0 10*3/uL (ref 0.0–0.7)
Eosinophils Relative: 0 %
HEMATOCRIT: 43.8 % (ref 36.0–46.0)
HEMOGLOBIN: 14.5 g/dL (ref 12.0–15.0)
LYMPHS ABS: 2.6 10*3/uL (ref 0.7–4.0)
LYMPHS PCT: 21 %
MCH: 30 pg (ref 26.0–34.0)
MCHC: 33.1 g/dL (ref 30.0–36.0)
MCV: 90.5 fL (ref 78.0–100.0)
MONO ABS: 0.5 10*3/uL (ref 0.1–1.0)
MONOS PCT: 4 %
NEUTROS ABS: 9.4 10*3/uL — AB (ref 1.7–7.7)
NEUTROS PCT: 75 %
Platelets: 202 10*3/uL (ref 150–400)
RBC: 4.84 MIL/uL (ref 3.87–5.11)
RDW: 14.3 % (ref 11.5–15.5)
WBC: 12.5 10*3/uL — ABNORMAL HIGH (ref 4.0–10.5)

## 2015-07-12 LAB — BASIC METABOLIC PANEL
ANION GAP: 10 (ref 5–15)
BUN: 20 mg/dL (ref 6–20)
CHLORIDE: 102 mmol/L (ref 101–111)
CO2: 34 mmol/L — AB (ref 22–32)
Calcium: 9.6 mg/dL (ref 8.9–10.3)
Creatinine, Ser: 0.94 mg/dL (ref 0.44–1.00)
GFR calc non Af Amer: >60 mL/min (ref >60)
GLUCOSE: 91 mg/dL (ref 65–99)
Potassium: 4.1 mmol/L (ref 3.5–5.1)
Sodium: 146 mmol/L — ABNORMAL HIGH (ref 135–145)

## 2015-07-12 NOTE — ED Provider Notes (Signed)
CSN: [id number]     Arrival date & time 07/12/15  2001 History   First MD Initiated Contact with Patient 07/12/15 2002     Chief Complaint  Patient presents with  . Extremity Weakness  . Multiple Sclerosis     (Consider location/radiation/quality/duration/timing/severity/associated sxs/prior Treatment) HPI  Barbara [name] is a 60 year old woman with a past medical history of untreated multiple sclerosis, gout, severe spinal stenosis who presents with worsening lower extremity weakness and inability to get about of bed since Friday. She called her PCP with a presumed diagnosis of gout and was told to increase her colchicine (she is on this chronically as well as ibuprofen); however, her symptoms did not resolve. She then had an episode of tremoring, feeling her legs locking in place, and worsening weakness. She called her neurologist, who recommended that she go to the ED for "basic labs and an MRI." She's noticed an overall decline in her functional status since 01/2015. Before September, she could get around with a walker. Now, she can only get around in a wheelchair. In fact, she's had to call the Fire department several times to assist her with transfers. Since Friday, she cannot do bed to chair transfers on her own. In fact, she's been taking her lasix for her leg swelling less so that she won't have to go to the bathroom as often. She's also noticed increased urinary incontinence (no bowel incontinence). She has longstanding numbness and tingling in her legs, left greater than right. An MRI of the brain in October shows no significant progression. She had a lumbar MRI in 12/16 that showed severe spinal stenosis. She denies upper extremity weakness, speech changes, double vision, headaches, neck stiffness. She used to be on Copaxone, but she stopped because she could not afford it after going on disability. She is a former Runner, broadcasting/film/video.    Past Medical History  Diagnosis Date  . Lumbar  spondylosis   . Spinal stenosis   . Weakness   . Numbness   . Lower back pain   . Gout   . MS (multiple sclerosis) [facility])    Past Surgical History  Procedure Laterality Date  . Cervical disc surgery      Qusetions C5-6 C6-7 and C7-T1   Family History  Problem Relation Age of Onset  . Heart disease Mother   . Diabetes Mother   . Cancer Father    Social History  Substance Use Topics  . Smoking status: Never Smoker   . Smokeless tobacco: Never Used  . Alcohol Use: No   OB History    No data available     Review of Systems  Constitutional: Negative for fever, chills and unexpected weight change.  HENT: Positive for sore throat. Negative for congestion.   Eyes: Negative for photophobia and visual disturbance.  Respiratory: Negative for cough and shortness of breath.   Cardiovascular: Positive for leg swelling. Negative for chest pain and palpitations.  Gastrointestinal: Negative for nausea and abdominal pain.  Endocrine: Negative for polydipsia and polyuria.  Genitourinary: Negative for dysuria and frequency.  Musculoskeletal: Positive for back pain, joint swelling and arthralgias.  Skin: Positive for color change. Negative for rash.  Allergic/Immunologic: Negative for environmental allergies and food allergies.  Neurological: Positive for tremors, weakness, numbness and headaches. Negative for dizziness, syncope and speech difficulty.  Psychiatric/Behavioral: Negative for dysphoric mood. The patient is not nervous/anxious.       Allergies  Review of patient's allergies indicates no known allergies.  Home Medications   Prior to Admission medications   Medication Sig Start Date End Date Taking? Authorizing Provider  albuterol (PROVENTIL HFA;VENTOLIN HFA) 108 (90 BASE) MCG/ACT inhaler Inhale 2 puffs into the lungs every 4 (four) hours as needed for wheezing or shortness of breath. 06/10/14  Yes Leighton Roach McDiarmid, MD  aspirin EC 325 MG tablet Take 325 mg by mouth daily.    Yes Historical Provider, MD  calcium-vitamin D (OSCAL WITH D) 500-200 MG-UNIT per tablet Take 1 tablet by mouth daily.     Yes Historical Provider, MD  COCONUT OIL PO Take 1 tablet by mouth daily.   Yes Historical Provider, MD  Coenzyme Q10 (CO Q 10 PO) Take 1 tablet by mouth daily.   Yes Historical Provider, MD  colchicine 0.6 MG tablet Take 1.2 mg by mouth daily.    Yes Historical Provider, MD  diazepam (VALIUM) 5 MG tablet TAKE 1 TABLET BY MOUTH EVERY 6 HOURS AS NEEDED FOR LEG SPASMS 01/05/15  Yes Moses Manners, MD  furosemide (LASIX) 20 MG tablet Take 1 tablet (20 mg total) by mouth daily. 02/09/15  Yes Moses Manners, MD  gabapentin (NEURONTIN) 300 MG capsule TAKE 1 CAPSULE BY MOUTH 3 TIMES DAILY. 05/04/15  Yes Moses Manners, MD  lisinopril (PRINIVIL,ZESTRIL) 10 MG tablet TAKE 1 TABLET BY MOUTH DAILY. Patient taking differently: TAKE 2 TABLETS BY MOUTH DAILY. 07/14/14  Yes Moses Manners, MD  methocarbamol (ROBAXIN) 750 MG tablet Take 2 tablets (1,500 mg total) by mouth 3 (three) times daily. 12/12/14  Yes Moses Manners, MD  oxybutynin (DITROPAN-XL) 5 MG 24 hr tablet Take 1 tablet (5 mg total) by mouth at bedtime. 03/07/15  Yes Vikram R Penumalli, MD  predniSONE (DELTASONE) 20 MG tablet TAKE 1 TABLET BY MOUTH 3 TIMES DAILY AS NEEDED FOR MS FLARE Patient taking differently: TAKE 1 TABLET BY MOUTH 3 TIMES DAILY FOR MS FLARE 12/12/14  Yes Moses Manners, MD  traMADol (ULTRAM) 50 MG tablet TAKE 1 TABLET BY MOUTH 4 TIMES DAILY 04/25/15  Yes Nestor Ramp, MD  doxycycline (VIBRA-TABS) 100 MG tablet Take 1 tablet (100 mg total) by mouth 2 (two) times daily. 01/03/15   Moses Manners, MD  Glatiramer Acetate (COPAXONE [place]) Inject into the skin.    Historical Provider, MD  guaiFENesin-codeine 100-10 MG/5ML syrup Take 5-10 mLs by mouth every 6 (six) hours as needed for cough. 06/04/14   Abigail Harris, PA-C   BP 146/96 mmHg  Pulse 57  Resp 18  Ht 5\' 3"  (1.6 m)  Wt 118.389 kg  BMI 46.25 kg/m2   SpO2 96%  LMP 10/07/2010 Physical Exam  Constitutional: She appears well-developed and well-nourished. No distress.  HENT:  Head: Normocephalic and atraumatic.  Mouth/Throat: Oropharynx is clear and moist. No oropharyngeal exudate.  Eyes: EOM are normal. Pupils are equal, round, and reactive to light. No scleral icterus.  Neck: Normal range of motion. Neck supple.  Increased neck circumference  Cardiovascular: Normal rate, regular rhythm and normal heart sounds.   Pulmonary/Chest: Effort normal and breath sounds normal. No respiratory distress. She has no wheezes.  Abdominal: Soft. Bowel sounds are normal. She exhibits no distension. There is no tenderness.  Musculoskeletal: She exhibits edema.  Neurological:  AAOx4. Speech Normal. 3 of 3 items recalled. Tongue midline. Face symmetric. Sensation to light touch diminished in LE, left greater than right. 2/5 strength in lower extremities, 5/5 strength in right. EOMI, but reports diplopia during movements. No visual field  cuts. FTN normal  Skin: Skin is warm and dry. She is not diaphoretic.  Area of faint erythema on left lower tibial area, not warm.  Psychiatric: She has a normal mood and affect. Her behavior is normal.  Nursing note and vitals reviewed.   ED Course  Procedures (including critical care time) Labs Review Labs Reviewed  CBC WITH DIFFERENTIAL/PLATELET - Abnormal; Notable for the following:    WBC 12.5 (*)    Neutro Abs 9.4 (*)    All other components within normal limits  BASIC METABOLIC PANEL  URINALYSIS, ROUTINE W REFLEX MICROSCOPIC (NOT AT [facility])    Imaging Review No results found. I have personally reviewed and evaluated these images and lab results as part of my medical decision-making.   EKG Interpretation None      MDM   Final diagnoses:  Weakness   This likely represents a natural progression of her multiple sclerosis. Will defer costly inpatient/ED MRI as this will not change management in the acute  setting. Patient may benefit from 1 gram solumedrol IV x5 days to improve functional status. Will obtain CBC, BMET, UA and admit to [facility] medicine.     Ruben Im, MD 07/12/15 2252  Zadie Rhine, MD 07/13/15 1515

## 2015-07-12 NOTE — H&P (Signed)
Family Medicine Teaching [facility] Admission History and Physical Service Pager: [phone]  Patient name: Barbara [name] Medical record number: [id number] Date of birth: Feb 07, 1956 Age: 60 y.o. Gender: female  Primary Care Provider: Sanjuana Letters, MD Consultants: Neurology in AM Code Status: DNR  Chief Complaint: worsening LE weakness L >R  Assessment and Plan: Ica Daye is a 60 y.o. female presenting with worsening LE weakness. PMH is significant for MS, HTN, spinal stenosis of lumbar region and cervical region, chronic venous stasis.   Worsening LE weakness in the setting of MS: Likely MS flare. Reasons for flare could be secondary to recent viral illness vs medication noncompliance vs advancing disease state. Patient does have history of spinal stenosis of lumbar region. Not on MS medication currently due to financial difficulties without insurance. Was on Copaxin in 2014. Followed by Dr. Marjory Lies, [facility] Neurology who recommended patient come in for further evaluation. This does not appear to be gout. Clinically stable. - admit to teaching service, med-surg, attending Dr. Pollie Meyer - IV Solumedrol  daily could increase to as much as  daily - MRI brain  - will need to consult neurology in the AM - continued home Robaxin 1,500mg  TID  - continue home Gabapentin  TID  - continue home ASA  daily - PT/OT consult - SW consult for financial assistance  -If not responding to steroids next step would be plasma exchange  HTN: Elevated in ED with SBP 180s/110s. Has not taken home Lisinopril.  - holding Lisinopril due to elevated Cr and patient will get MRI w/wo contrast on admission  - Hydralazine PRN SBP >180 and/or DBP >110 -restart oral medications when possible  Elevated Cr: 0.9 from 0.7 in October.  - monitor with labs - avoid nephrotoxic meds -Cr may worsen in setting of contrast for MRI  Urinary Incontinence: chronic issue which seems to  be worsening per patient.  - continue home Oxybutynin - UA pending  Leukocytosis: Mildly elevated at 12.5. Patient has been taking Prednisone at home which is most likely reason for elevation. No clinical signs of infection. Afebrile.  - will monitor with labs  Chronic Venous Stasis: No signs of infection. Takes Lasix daily for LE edema - hold Lasix due to elevated Cr   FEN/GI: Heart diet, saline lock Prophylaxis: Lovenox Sub Q  Disposition: admit to inpatient service; discharge pending clinical improvement.   History of Present Illness:  Barbara [name] is a 60 y.o. female presenting with worsening LE weakness.   Patient reports that she has been using a wheelchair since October. Patient reports of left leg weakness starting Friday; her weakness was more than usual. Usually has been able to transfer herself from wheelchair but has not been able to since Friday. She has not been able to walk either.  Also notes that Friday evening she had an episode of her "body shivering/trembling all over". This episode lasted 1 hour. She took a dose of her muscle relaxer and gabapentin. Then an hour after had another episode that she described as internal trembling. She has had urinary incontinence for years but this seems to be worsening. She was seen by neurology in September and was prescribed Oxybutynin which sometimes helps. She also has bowel incontinence at times but is infrequent; this is not a new symptom. Her legs are chronically numb from waist down.   She initially thought her symptoms were due to a gout flare. She spoke with her PCP over the phone, who recommended taking colchicine BID. She has also  been taking Prednisone 20mg  TID since Saturday because she thought this was a gout flare. Symptoms have not been improving with these medications. She then spoke with her neurologist over the phone, who recommended her to go to ED for lab work and imaging.   Denies rhinorrhea or cough but notes she  may have had mild sore throat and some wheezing recently. Reports she had stomach virus for 3 days last weekend with vomiting and diarrhea, but this has now completely resolved. She may have had a fever during this time, but has been afebrile since.   She was last seen by Neurology in 11/2-16. Takes nothing for MS - used to take copaxern back in 2014. Currently not on any medications due to financial difficulties without insurance.   She was noted to have elevated blood pressures. Notes of headache but no visual disturbances, chest pain, or dyspnea. She has not taken her home Lisinopril today, and states she may have missed doses this week.     Review Of Systems: Per HPI  Otherwise the remainder of the systems were negative.  Patient Active Problem List   Diagnosis Date Noted  . Need for hepatitis C screening test 02/09/2015  . Screening for HIV without presence of risk factors 02/09/2015  . Encounter for chronic pain management 04/21/2014  . Left hip pain 11/09/2013  . Right hip pain 06/09/2013  . Chronic venous insufficiency 11/05/2012  . Ankle pain 05/18/2012  . Spinal stenosis in cervical region 06/03/2011  . Multiple sclerosis (HCC) 12/07/2010  . 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 Spinal stenosis of lumbar region 12/07/2010  . Hypertension 12/07/2010  . Obesity 12/07/2010  . Chronic gout 12/07/2010  . Breast cancer screening 12/07/2010    Past Medical History: Past Medical History  Diagnosis Date  . Lumbar spondylosis   . Spinal stenosis   . Weakness   . Numbness   . Lower back pain   . Gout   . MS (multiple sclerosis) (HCC)     Past Surgical History: Past Surgical History  Procedure Laterality Date  . Cervical disc surgery      Qusetions C5-6 C6-7 and C7-T1    Social History: Social History  Substance Use Topics  . Smoking status: Never Smoker   . Smokeless tobacco: Never Used  . Alcohol Use: No   Additional social history: Lives alone with help from sister   Please also refer to relevant sections of EMR.  Family History: Family History  Problem Relation Age of Onset  . Heart disease Mother   . Diabetes Mother   . Cancer Father      Allergies and Medications: No Known Allergies No current facility-administered medications on file prior to encounter.   Current Outpatient Prescriptions on File Prior to Encounter  Medication Sig Dispense Refill  . albuterol (PROVENTIL HFA;VENTOLIN HFA) 108 (90 BASE) MCG/ACT inhaler Inhale 2 puffs into the lungs every 4 (four) hours as needed for wheezing or shortness of breath. 1 Inhaler 0  . aspirin EC 325 MG tablet Take 325 mg by mouth daily.    . calcium-vitamin D (OSCAL WITH D) 500-200 MG-UNIT per tablet Take 1 tablet by mouth daily.      . COCONUT OIL PO Take 1 tablet by mouth daily.    . Coenzyme Q10 (CO Q 10 PO) Take 1 tablet by mouth daily.    . colchicine 0.6 MG tablet Take 1.2 mg by mouth daily.     . diazepam (VALIUM) 5 MG tablet  TAKE 1 TABLET BY MOUTH EVERY 6 HOURS AS NEEDED FOR LEG SPASMS 90 tablet 5  . furosemide (LASIX) 20 MG tablet Take 1 tablet (20 mg total) by mouth daily. 30 tablet 3  . gabapentin (NEURONTIN) 300 MG capsule TAKE 1 CAPSULE BY MOUTH 3 TIMES DAILY. 270 capsule 3  . lisinopril (PRINIVIL,ZESTRIL) 10 MG tablet TAKE 1 TABLET BY MOUTH DAILY. (Patient taking differently: TAKE 2 TABLETS BY MOUTH DAILY.) 90 tablet 3  . methocarbamol (ROBAXIN) 750 MG tablet Take 2 tablets (1,500 mg total) by mouth 3 (three) times daily. 540 tablet 3  . oxybutynin (DITROPAN-XL) 5 MG 24 hr tablet Take 1 tablet (5 mg total) by mouth at bedtime. 30 tablet 6  . predniSONE (DELTASONE) 20 MG tablet TAKE 1 TABLET BY MOUTH 3 TIMES DAILY AS NEEDED FOR MS FLARE (Patient taking differently: TAKE 1 TABLET BY MOUTH 3 TIMES DAILY FOR MS FLARE) 45 tablet 3  . traMADol (ULTRAM) 50 MG tablet TAKE 1 TABLET BY MOUTH 4 TIMES DAILY 360 tablet 0  . doxycycline (VIBRA-TABS) 100 MG tablet Take 1 tablet (100 mg total) by mouth  2 (two) times daily. 20 tablet 0  . Glatiramer Acetate (COPAXONE [place]) Inject into the skin.    Marland Kitchen guaiFENesin-codeine 100-10 MG/5ML syrup Take 5-10 mLs by mouth every 6 (six) hours as needed for cough. 120 mL 0    Objective: BP 146/96 mmHg  Pulse 57  Resp 18  Ht 5\' 3"  (1.6 m)  Wt 261 lb (118.389 kg)  BMI 46.25 kg/m2  SpO2 96%  LMP 10/07/2010 Exam: General: NAD, lying in bed, obese WF Eyes: EMOI, PERRL ENTM: mildly dry MM, oropharynx clear Neck: thyroid normal Cardiovascular: RRR, no m/r/g Respiratory: normal effort, CTAB Abdomen: soft, NT, ND MSK: LE- skin changes from chronic venous insufficiency, patch of erythema on left shin, no increased warmth or tenderness to palpation. Pitting edema on feet bilaterally with trace pitting edema up to mid shin.  Skin: warm and dry; intact Neuro: CN 2-12 intact (did not evaluate visual fields), strength 5/5 in upper extremities bilaterally, sensation intact to light touch in upper extremities. LE: 2/5 strength in lower extremities bilaterally; sensation to light touch decreased in both LE compared to UE but worse in LLE compare to RLE. Patellar reflexes 2+ bilaterally. Cerebellar test: wnl Psych: Mood and affect euthymic, normal rate and volume of speech  Labs and Imaging: Results for orders placed or performed during the hospital encounter of 07/12/15 (from the past 24 hour(s))  Basic metabolic panel     Status: Abnormal   Collection Time: 07/12/15 10:30 PM  Result Value Ref Range   Sodium 146 (H) 135 - 145 mmol/L   Potassium 4.1 3.5 - 5.1 mmol/L   Chloride 102 101 - 111 mmol/L   CO2 34 (H) 22 - 32 mmol/L   Glucose, Bld 91 65 - 99 mg/dL   BUN 20 6 - 20 mg/dL   Creatinine, Ser 1.61 0.44 - 1.00 mg/dL   Calcium 9.6 8.9 - 09.6 mg/dL   GFR calc non Af Amer >60 >60 mL/min   GFR calc Af Amer >60 >60 mL/min   Anion gap 10 5 - 15  CBC with Differential     Status: Abnormal   Collection Time: 07/12/15 10:30 PM  Result Value Ref Range   WBC  12.5 (H) 4.0 - 10.5 K/uL   RBC 4.84 3.87 - 5.11 MIL/uL   Hemoglobin 14.5 12.0 - 15.0 g/dL   HCT 04.5 40.9 - 81.1 %  MCV 90.5 78.0 - 100.0 fL   MCH 30.0 26.0 - 34.0 pg   MCHC 33.1 30.0 - 36.0 g/dL   RDW 76.8 11.5 - 72.6 %   Platelets 202 150 - 400 K/uL   Neutrophils Relative % 75 %   Neutro Abs 9.4 (H) 1.7 - 7.7 K/uL   Lymphocytes Relative 21 %   Lymphs Abs 2.6 0.7 - 4.0 K/uL   Monocytes Relative 4 %   Monocytes Absolute 0.5 0.1 - 1.0 K/uL   Eosinophils Relative 0 %   Eosinophils Absolute 0.0 0.0 - 0.7 K/uL   Basophils Relative 0 %   Basophils Absolute 0.0 0.0 - 0.1 K/uL    Palma Holter, MD 07/12/2015, 10:50 PM PGY-1, [place] [facility] FPTS Intern pager: [phone], text pages welcome   FPTS Upper-Level Resident Addendum  I have independently interviewed and examined the patient. I have discussed the above with the original author and agree with their documentation. My edits for correction/addition/clarification are in pink. Please see also any attending notes.   Pincus Large, DO PGY-2, [place] [facility] FPTS Service pager: [phone] (text pages welcome through [name])

## 2015-07-12 NOTE — Telephone Encounter (Signed)
Spoke with patient who stated "It began with spasms in my legs Fri night. I was trembling, teeth chattering, and I couldn't hold anything." She was able to get to her gabapentin and carbamazepine and took as prescribed. The symptoms finally stopped, and she sat on side of bed. Symptoms began again around 5 am, and she called her sister who helped her back into bed. She called PCP who instructed her to increase gout medicine and take prednisone. She is taking prednisone 20 mg tid. She continues taking gabapentin 300 mg tid. She stated her symptoms are no better, left leg worse than right . She stated she literally has been unable to get out of bed since.  Informed her would discuss with Dr Marjory Lies and call her back today. She verbalized understanding, appreciation.

## 2015-07-12 NOTE — ED Notes (Signed)
Dr. Danae Orleans is her Neurologist; with [facility]

## 2015-07-12 NOTE — Telephone Encounter (Signed)
Pre Dr Marjory Lies, spoke with patient and advised she go to ED for urgent evaluation. She verbalized understanding, agreement.

## 2015-07-12 NOTE — ED Provider Notes (Signed)
Patient seen/examined in the Emergency Department in conjunction with Resident Physician Provider  Patient reports increasing weakness in her lower extremities due to her MS.  She is unable to ambulate or transfer from bed Exam : awake/alert, no distress, she has minimal motor function in either of her lower extremities Plan: pt here with progressively worsening weakness in her LE due to MS Will check labs and likely need admission    Zadie Rhine, MD 07/12/15 2124

## 2015-07-12 NOTE — Telephone Encounter (Signed)
Pt says since Friday night she is unable to get out of bed. She can not move her legs at all. She has increased her Gout medication and Prednisone upon PCP direction. She thought it was gout but now does not believe so. She thinks that it may be her MS. Pt says she does feel a little foggy, not sure if its due to medication or just her norm. Please call and advise [phone]

## 2015-07-12 NOTE — ED Notes (Signed)
Pt symptoms started Friday night.  Pt contacted neurologist prior to coming  Left leg issues on normal. Pt noticed right leg becoming weaker over the last 5 days and pt is unable to move leg now like the left leg. Chronic Back pain 7/10. Pt pain originally a 3/10.  Pt has equal grip strength and stroke screen neg per EMS.

## 2015-07-12 NOTE — Telephone Encounter (Signed)
Needs to go to ER for urgent evaluation. -VRP

## 2015-07-13 ENCOUNTER — Inpatient Hospital Stay (HOSPITAL_COMMUNITY): Payer: PPO

## 2015-07-13 DIAGNOSIS — M4806 Spinal stenosis, lumbar region: Secondary | ICD-10-CM

## 2015-07-13 DIAGNOSIS — G35 Multiple sclerosis: Secondary | ICD-10-CM | POA: Insufficient documentation

## 2015-07-13 DIAGNOSIS — R531 Weakness: Secondary | ICD-10-CM | POA: Diagnosis not present

## 2015-07-13 DIAGNOSIS — R32 Unspecified urinary incontinence: Secondary | ICD-10-CM | POA: Diagnosis not present

## 2015-07-13 DIAGNOSIS — R5383 Other fatigue: Secondary | ICD-10-CM

## 2015-07-13 DIAGNOSIS — R29898 Other symptoms and signs involving the musculoskeletal system: Secondary | ICD-10-CM | POA: Insufficient documentation

## 2015-07-13 DIAGNOSIS — G8220 Paraplegia, unspecified: Secondary | ICD-10-CM

## 2015-07-13 DIAGNOSIS — R5381 Other malaise: Secondary | ICD-10-CM

## 2015-07-13 DIAGNOSIS — I10 Essential (primary) hypertension: Secondary | ICD-10-CM | POA: Insufficient documentation

## 2015-07-13 DIAGNOSIS — IMO0002 Reserved for concepts with insufficient information to code with codable children: Secondary | ICD-10-CM | POA: Insufficient documentation

## 2015-07-13 LAB — BASIC METABOLIC PANEL
ANION GAP: 14 (ref 5–15)
BUN: 22 mg/dL — ABNORMAL HIGH (ref 6–20)
CHLORIDE: 102 mmol/L (ref 101–111)
CO2: 28 mmol/L (ref 22–32)
CREATININE: 0.91 mg/dL (ref 0.44–1.00)
Calcium: 9.4 mg/dL (ref 8.9–10.3)
GFR calc non Af Amer: >60 mL/min (ref >60)
Glucose, Bld: 99 mg/dL (ref 65–99)
POTASSIUM: 3.4 mmol/L — AB (ref 3.5–5.1)
Sodium: 144 mmol/L (ref 135–145)

## 2015-07-13 LAB — CBC
HCT: 43.5 % (ref 36.0–46.0)
HEMATOCRIT: 43.9 % (ref 36.0–46.0)
HEMOGLOBIN: 14.7 g/dL (ref 12.0–15.0)
Hemoglobin: 14.6 g/dL (ref 12.0–15.0)
MCH: 30.1 pg (ref 26.0–34.0)
MCH: 30.1 pg (ref 26.0–34.0)
MCHC: 33.5 g/dL (ref 30.0–36.0)
MCHC: 33.6 g/dL (ref 30.0–36.0)
MCV: 90 fL (ref 78.0–100.0)
MCV: 89.7 fL (ref 78.0–100.0)
PLATELETS: 198 10*3/uL (ref 150–400)
Platelets: 200 10*3/uL (ref 150–400)
RBC: 4.88 MIL/uL (ref 3.87–5.11)
RBC: 4.85 MIL/uL (ref 3.87–5.11)
RDW: 14.5 % (ref 11.5–15.5)
RDW: 14.4 % (ref 11.5–15.5)
WBC: 13.4 10*3/uL — ABNORMAL HIGH (ref 4.0–10.5)
WBC: 13.4 10*3/uL — AB (ref 4.0–10.5)

## 2015-07-13 LAB — CREATININE, SERUM
Creatinine, Ser: 0.93 mg/dL (ref 0.44–1.00)
GFR calc Af Amer: >60 mL/min (ref >60)
GFR calc non Af Amer: >60 mL/min (ref >60)

## 2015-07-13 MED ORDER — POTASSIUM CHLORIDE CRYS ER 20 MEQ PO TBCR
40.0000 meq | EXTENDED_RELEASE_TABLET | Freq: Once | ORAL | Status: AC
  Administered 2015-07-13: 40 meq via ORAL
  Filled 2015-07-13: qty 2

## 2015-07-13 MED ORDER — GADOBENATE DIMEGLUMINE 529 MG/ML IV SOLN
20.0000 mL | Freq: Once | INTRAVENOUS | Status: AC | PRN
  Administered 2015-07-13: 20 mL via INTRAVENOUS

## 2015-07-13 MED ORDER — ENOXAPARIN SODIUM 40 MG/0.4ML ~~LOC~~ SOLN
40.0000 mg | Freq: Every day | SUBCUTANEOUS | Status: DC
  Administered 2015-07-13 – 2015-07-14 (×2): 40 mg via SUBCUTANEOUS
  Filled 2015-07-13 (×2): qty 0.4

## 2015-07-13 MED ORDER — GABAPENTIN 300 MG PO CAPS
300.0000 mg | ORAL_CAPSULE | Freq: Three times a day (TID) | ORAL | Status: DC
  Administered 2015-07-13 – 2015-07-19 (×20): 300 mg via ORAL
  Filled 2015-07-13 (×20): qty 1

## 2015-07-13 MED ORDER — METHYLPREDNISOLONE SODIUM SUCC 1000 MG IJ SOLR
500.0000 mg | Freq: Every day | INTRAMUSCULAR | Status: DC
  Administered 2015-07-13: 500 mg via INTRAVENOUS
  Filled 2015-07-13: qty 4

## 2015-07-13 MED ORDER — ACETAMINOPHEN 500 MG PO TABS
1000.0000 mg | ORAL_TABLET | Freq: Four times a day (QID) | ORAL | Status: DC | PRN
  Administered 2015-07-13: 1000 mg via ORAL
  Filled 2015-07-13: qty 2

## 2015-07-13 MED ORDER — ONDANSETRON 4 MG PO TBDP
4.0000 mg | ORAL_TABLET | Freq: Three times a day (TID) | ORAL | Status: DC | PRN
  Administered 2015-07-13 – 2015-07-17 (×2): 4 mg via ORAL
  Filled 2015-07-13 (×3): qty 1

## 2015-07-13 MED ORDER — HYDRALAZINE HCL 20 MG/ML IJ SOLN
5.0000 mg | INTRAMUSCULAR | Status: DC | PRN
  Administered 2015-07-13: 5 mg via INTRAVENOUS
  Filled 2015-07-13: qty 1

## 2015-07-13 MED ORDER — ASPIRIN EC 325 MG PO TBEC
325.0000 mg | DELAYED_RELEASE_TABLET | Freq: Every day | ORAL | Status: DC
  Administered 2015-07-13 – 2015-07-19 (×7): 325 mg via ORAL
  Filled 2015-07-13 (×7): qty 1

## 2015-07-13 MED ORDER — OXYBUTYNIN CHLORIDE ER 5 MG PO TB24
5.0000 mg | ORAL_TABLET | Freq: Every day | ORAL | Status: DC
  Administered 2015-07-13 (×2): 5 mg via ORAL
  Filled 2015-07-13 (×2): qty 1

## 2015-07-13 MED ORDER — PANTOPRAZOLE SODIUM 40 MG IV SOLR
40.0000 mg | INTRAVENOUS | Status: DC
  Administered 2015-07-13: 40 mg via INTRAVENOUS

## 2015-07-13 MED ORDER — ACETAMINOPHEN 325 MG PO TABS
650.0000 mg | ORAL_TABLET | Freq: Four times a day (QID) | ORAL | Status: DC | PRN
  Administered 2015-07-13: 650 mg via ORAL
  Filled 2015-07-13: qty 2

## 2015-07-13 MED ORDER — SODIUM CHLORIDE 0.9 % IV SOLN
500.0000 mg | Freq: Two times a day (BID) | INTRAVENOUS | Status: AC
  Administered 2015-07-13 – 2015-07-16 (×5): 500 mg via INTRAVENOUS
  Filled 2015-07-13 (×8): qty 4

## 2015-07-13 MED ORDER — METHOCARBAMOL 750 MG PO TABS
1500.0000 mg | ORAL_TABLET | Freq: Three times a day (TID) | ORAL | Status: DC
  Administered 2015-07-13 – 2015-07-19 (×21): 1500 mg via ORAL
  Filled 2015-07-13 (×22): qty 2

## 2015-07-13 NOTE — Care Management Note (Signed)
Case Management Note  Patient Details  Name: Barbara [name] MRN: [id number] Date of Birth: [birth year]  Subjective/Objective:                    Action/Plan: Patient was admitted with an MS exacerbation.  Lives at home alone. Will follow for discharge needs.  Expected Discharge Date:                  Expected Discharge Plan:     In-House Referral:     Discharge planning Services     Post Acute Care Choice:    Choice offered to:     DME Arranged:    DME Agency:     HH Arranged:    HH Agency:     Status of Service:  In process, will continue to follow  Medicare Important Message Given:    Date Medicare IM Given:    Medicare IM give by:    Date Additional Medicare IM Given:    Additional Medicare Important Message give by:     If discussed at Long Length of Stay Meetings, dates discussed:    Additional CommentsAnda Kraft, RN 07/13/2015, 10:08 AM [phone]

## 2015-07-13 NOTE — Consult Note (Signed)
Physical Medicine and Rehabilitation Consult Reason for Consult: MS exacerbation Referring Physician: Family medicine   HPI: Barbara [name] is a 60 y.o. right handed female with history of chronic venous stasis, lumbar and cervical stenosis with cervical surgery 2013 and lumbar laminectomy 2014 at [facility], multiple sclerosis diagnosed 2012 followed by Dr. Danie Binder neurological and maintained on Copaxone up until 2014 and stopped due to financial restrictions.  Presented 07/12/2015 with increasing lower extremity weakness. Patient lives alone essentially wheelchair bound prior to admission she was able to bathe and dress herself as well as stand pivot transfers to the wheelchair. She had been ambulating short distances with a rolling walker up and to October 2016. She has a lift chair into her home due to numerous steps. MRI of the brain showed no new intracranial abnormalities no new or active demyelinating brain lesion identified since prior study 03/05/2015. Mild leukocytosis [id number] maintained on chronic prednisone. Urine study is pending. Neurology follow-up workup ongoing MRI cervical spine thoracic spine pending. Subcutaneous Lovenox for DVT prophylaxis. Suspect exacerbation of multiple sclerosis. Physical therapy evaluation completed 07/13/2015 with recommendations of physical medicine rehabilitation consult.  Patient states that she was ambulating with a cane from 2012-2014. After her lumbar surgery, L1-L3 decompression and fusion in 2014 she progressed from using a cane to using a walker. In October 2016 she had further progression of left lower extremity weakness and started using a wheelchair as her primary mode of locomotion.  There was a lumbar MRI from 05/05/2015 showing severe spinal stenosis at L3-L4 and L4-L5, Patient does not recall talking to her doctors about these results, There was a email stating that the L4-5 stenosis may require surgery Review  of Systems  Constitutional: Positive for malaise/fatigue. Negative for fever and chills.  Eyes: Negative for blurred vision and double vision.  Respiratory: Negative for cough and shortness of breath.   Cardiovascular: Positive for leg swelling. Negative for chest pain and palpitations.  Gastrointestinal: Positive for constipation. Negative for nausea and vomiting.  Genitourinary:       Urinary incontinence  Musculoskeletal: Positive for myalgias and back pain.  Skin: Negative for rash.  Neurological: Positive for weakness. Negative for headaches.       Numbness  All other systems reviewed and are negative.  Past Medical History  Diagnosis Date  . Lumbar spondylosis   . Spinal stenosis   . Weakness   . Numbness   . Lower back pain   . Gout   . MS (multiple sclerosis) [facility])    Past Surgical History  Procedure Laterality Date  . Cervical disc surgery      Qusetions C5-6 C6-7 and C7-T1   Family History  Problem Relation Age of Onset  . Heart disease Mother   . Diabetes Mother   . Cancer Father    Social History:  reports that she has never smoked. She has never used smokeless tobacco. She reports that she does not drink alcohol or use illicit drugs. Allergies: No Known Allergies Medications Prior to Admission  Medication Sig Dispense Refill  . albuterol (PROVENTIL HFA;VENTOLIN HFA) 108 (90 BASE) MCG/ACT inhaler Inhale 2 puffs into the lungs every 4 (four) hours as needed for wheezing or shortness of breath. 1 Inhaler 0  . aspirin EC 325 MG tablet Take 325 mg by mouth daily.    . calcium-vitamin D (OSCAL WITH D) 500-200 MG-UNIT per tablet Take 1 tablet by mouth daily.      Tamala Julian  OIL PO Take 1 tablet by mouth daily.    . Coenzyme Q10 (CO Q 10 PO) Take 1 tablet by mouth daily.    . colchicine 0.6 MG tablet Take 1.2 mg by mouth daily.     . diazepam (VALIUM) 5 MG tablet TAKE 1 TABLET BY MOUTH EVERY 6 HOURS AS NEEDED FOR LEG SPASMS 90 tablet 5  . furosemide (LASIX) 20 MG  tablet Take 1 tablet (20 mg total) by mouth daily. 30 tablet 3  . gabapentin (NEURONTIN) 300 MG capsule TAKE 1 CAPSULE BY MOUTH 3 TIMES DAILY. 270 capsule 3  . lisinopril (PRINIVIL,ZESTRIL) 10 MG tablet TAKE 1 TABLET BY MOUTH DAILY. (Patient taking differently: TAKE 2 TABLETS BY MOUTH DAILY.) 90 tablet 3  . methocarbamol (ROBAXIN) 750 MG tablet Take 2 tablets (1,500 mg total) by mouth 3 (three) times daily. 540 tablet 3  . oxybutynin (DITROPAN-XL) 5 MG 24 hr tablet Take 1 tablet (5 mg total) by mouth at bedtime. 30 tablet 6  . predniSONE (DELTASONE) 20 MG tablet TAKE 1 TABLET BY MOUTH 3 TIMES DAILY AS NEEDED FOR MS FLARE (Patient taking differently: TAKE 1 TABLET BY MOUTH 3 TIMES DAILY FOR MS FLARE) 45 tablet 3  . traMADol (ULTRAM) 50 MG tablet TAKE 1 TABLET BY MOUTH 4 TIMES DAILY 360 tablet 0  . doxycycline (VIBRA-TABS) 100 MG tablet Take 1 tablet (100 mg total) by mouth 2 (two) times daily. 20 tablet 0  . Glatiramer Acetate (COPAXONE [place]) Inject into the skin.    Marland Kitchen guaiFENesin-codeine 100-10 MG/5ML syrup Take 5-10 mLs by mouth every 6 (six) hours as needed for cough. 120 mL 0    Home: Home Living Family/patient expects to be discharged to:: Private residence Living Arrangements: Alone Available Help at Discharge: Family, Available PRN/intermittently Type of Home: House Home Access: Other (comment) (lift chair) Home Layout: One level Bathroom Shower/Tub: Walk-in shower Home Equipment: Wheelchair - manual, Environmental consultant - 2 wheels, Environmental consultant - 4 wheels, Bedside commode, Shower seat, Tub bench  Functional History: Prior Function Level of Independence: Independent with assistive device(s) Comments: Able to bath/dress self at home PTA, stand pivot transfers to w/c Functional Status:  Mobility: Bed Mobility Overal bed mobility: Needs Assistance Bed Mobility: Rolling, Sidelying to Sit Rolling: Min guard Sidelying to sit: Min assist General bed mobility comments: Able to roll onto LT side but could  not flex Lt hip enough to bring off bed, requiring min assist for this LE. Good UE strength to push self into seated position. Heavy use of rail. Transfers Overall transfer level: Needs assistance Equipment used: Rolling walker (2 wheeled) Transfer via Lift Equipment: Hydrographic surveyor Transfers: Sit to/from Stand Sit to Stand: Max assist, +2 physical assistance General transfer comment: Attempted several trials of sit<>stand from bed with cues for anterior weight shift. Could not clear buttocks. Limited by weakness and especially pain in Lt ankle upon weight-bearing. VC for hand placement. Used sara + for transfer. Pt demonstrates good effort with boosting with RLE and UEs for support with this device. Able to perform mini-squats in upright position with support from sara plus.      ADL:    Cognition: Cognition Overall Cognitive Status: Within Functional Limits for tasks assessed Cognition Arousal/Alertness: Awake/alert Behavior During Therapy: WFL for tasks assessed/performed Overall Cognitive Status: Within Functional Limits for tasks assessed  Blood pressure 140/82, pulse 73, temperature 99 F (37.2 C), temperature source Oral, resp. rate 18, height 5\' 3"  (1.6 m), weight 118.389 kg (261 lb), last menstrual period  10/07/2010, SpO2 93 %. Physical Exam  Constitutional: She is oriented to person, place, and time.  HENT:  Head: Normocephalic.  Eyes: EOM are normal.  Neck: Normal range of motion. Neck supple. No thyromegaly present.  Cardiovascular: Normal rate and regular rhythm.   Respiratory: Effort normal and breath sounds normal. No respiratory distress.  GI: Soft. Bowel sounds are normal. She exhibits no distension.  Neurological: She is alert and oriented to person, place, and time.  Skin: Skin is warm and dry.  Motor strength is 5/5 bilateral deltoid, biceps, triceps, grip, trace bilateral hip flexion, trace bilateral quads, 2 minus hip extension bilaterally, trace ankle plantar  flexion on the right, 0 ankle plantarflexion on the left, 0 ankle dorsiflexion bilateral Sensation reduced to light touch in both L5 and S1 dermatomes but intact at L3 and L4 dermatomes. Severe left and mild right venous Stasis dermatitis Results for orders placed or performed during the hospital encounter of 07/12/15 (from the past 24 hour(s))  Basic metabolic panel     Status: Abnormal   Collection Time: 07/12/15 10:30 PM  Result Value Ref Range   Sodium 146 (H) 135 - 145 mmol/L   Potassium 4.1 3.5 - 5.1 mmol/L   Chloride 102 101 - 111 mmol/L   CO2 34 (H) 22 - 32 mmol/L   Glucose, Bld 91 65 - 99 mg/dL   BUN 20 6 - 20 mg/dL   Creatinine, Ser 1.61 0.44 - 1.00 mg/dL   Calcium 9.6 8.9 - 09.6 mg/dL   GFR calc non Af Amer >60 >60 mL/min   GFR calc Af Amer >60 >60 mL/min   Anion gap 10 5 - 15  CBC with Differential     Status: Abnormal   Collection Time: 07/12/15 10:30 PM  Result Value Ref Range   WBC 12.5 (H) 4.0 - 10.5 K/uL   RBC 4.84 3.87 - 5.11 MIL/uL   Hemoglobin 14.5 12.0 - 15.0 g/dL   HCT 04.5 40.9 - 81.1 %   MCV 90.5 78.0 - 100.0 fL   MCH 30.0 26.0 - 34.0 pg   MCHC 33.1 30.0 - 36.0 g/dL   RDW 91.4 78.2 - 95.6 %   Platelets 202 150 - 400 K/uL   Neutrophils Relative % 75 %   Neutro Abs 9.4 (H) 1.7 - 7.7 K/uL   Lymphocytes Relative 21 %   Lymphs Abs 2.6 0.7 - 4.0 K/uL   Monocytes Relative 4 %   Monocytes Absolute 0.5 0.1 - 1.0 K/uL   Eosinophils Relative 0 %   Eosinophils Absolute 0.0 0.0 - 0.7 K/uL   Basophils Relative 0 %   Basophils Absolute 0.0 0.0 - 0.1 K/uL  CBC     Status: Abnormal   Collection Time: 07/13/15  1:29 AM  Result Value Ref Range   WBC 13.4 (H) 4.0 - 10.5 K/uL   RBC 4.88 3.87 - 5.11 MIL/uL   Hemoglobin 14.7 12.0 - 15.0 g/dL   HCT 21.3 08.6 - 57.8 %   MCV 90.0 78.0 - 100.0 fL   MCH 30.1 26.0 - 34.0 pg   MCHC 33.5 30.0 - 36.0 g/dL   RDW 46.9 62.9 - 52.8 %   Platelets 200 150 - 400 K/uL  Creatinine, serum     Status: None   Collection Time:  07/13/15  1:29 AM  Result Value Ref Range   Creatinine, Ser 0.93 0.44 - 1.00 mg/dL   GFR calc non Af Amer >60 >60 mL/min   GFR calc Af Amer >  60 >60 mL/min  Basic metabolic panel     Status: Abnormal   Collection Time: 07/13/15  3:19 AM  Result Value Ref Range   Sodium 144 135 - 145 mmol/L   Potassium 3.4 (L) 3.5 - 5.1 mmol/L   Chloride 102 101 - 111 mmol/L   CO2 28 22 - 32 mmol/L   Glucose, Bld 99 65 - 99 mg/dL   BUN 22 (H) 6 - 20 mg/dL   Creatinine, Ser 1.61 0.44 - 1.00 mg/dL   Calcium 9.4 8.9 - 09.6 mg/dL   GFR calc non Af Amer >60 >60 mL/min   GFR calc Af Amer >60 >60 mL/min   Anion gap 14 5 - 15  CBC     Status: Abnormal   Collection Time: 07/13/15  3:19 AM  Result Value Ref Range   WBC 13.4 (H) 4.0 - 10.5 K/uL   RBC 4.85 3.87 - 5.11 MIL/uL   Hemoglobin 14.6 12.0 - 15.0 g/dL   HCT 04.5 40.9 - 81.1 %   MCV 89.7 78.0 - 100.0 fL   MCH 30.1 26.0 - 34.0 pg   MCHC 33.6 30.0 - 36.0 g/dL   RDW 91.4 78.2 - 95.6 %   Platelets 198 150 - 400 K/uL   Mr Laqueta Jean Wo Contrast  07/13/2015  CLINICAL DATA:  60 year old female with multiple sclerosis, worsening lower extremity weakness. Suspected MS flare. Initial encounter. EXAM: MRI HEAD WITHOUT AND WITH CONTRAST TECHNIQUE: Multiplanar, multiecho pulse sequences of the brain and surrounding structures were obtained without and with intravenous contrast. CONTRAST:  20mL MULTIHANCE GADOBENATE DIMEGLUMINE 529 MG/ML IV SOLN COMPARISON:  Brain MRI 03/05/2015, 05/24/2010. FINDINGS: Major intracranial vascular flow voids are stable. No restricted diffusion to suggest acute infarction. No midline shift, mass effect, evidence of mass lesion, ventriculomegaly, extra-axial collection or acute intracranial hemorrhage. Cervicomedullary junction and pituitary are within normal limits. Stable cerebral volume. Mild periventricular white matter T2 and FLAIR hyperintensity is stable since Sep 22, 2014, most pronounced along the body of the left lateral ventricle. No new  cerebral white matter lesions. No restricted or enhancing brain lesions. Chronic nonspecific T2 hyperintensity in the globus pallidus (series 6, image 14) is stable. No definite brainstem or cerebellar signal abnormality. No chronic cerebral blood products. Visualized cervical spine remarkable for chronic dorsal spinal cord demyelinating lesion at C2-C3 (series 2, image 111). Visible internal auditory structures appear normal. Stable trace mastoid fluid. Asymmetric T2 hyperintensity of the right optic nerve appears stable (series 11, image 25) with otherwise negative orbit soft tissues. Bone marrow signal is stable and within normal limits. Soft tissues IMPRESSION: 1. Stable MRI appearance of the brain since 03/05/2015. No new or active demyelinating brain lesion identified. 2. No new intracranial abnormality. Electronically Signed   By: Odessa Fleming M.D.   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 Does the need for close, 24 hr/day medical supervision in concert with the patient's rehab needs make it unreasonable for this patient to be served in a less intensive setting? Yes 2. Co-Morbidities requiring supervision/potential complications: Venous stasis dermatitis, history of cervical stenosis 3. Due to bladder management, bowel management, safety, skin/wound care, disease management, medication administration, pain management and patient education, does the patient require 24 hr/day rehab nursing? Yes 4. Does the patient require coordinated care of a physician, rehab nurse, PT (1-2 hrs/day, 5 days/week) and OT (11-2 hrs/day, 55 days/week) to address physical and functional deficits  in the context of the above medical diagnosis(es)? Yes Addressing deficits in the following areas: balance, endurance, locomotion, strength, transferring, bowel/bladder control, bathing, dressing, feeding, grooming, toileting,  cognition and psychosocial support 5. Can the patient actively participate in an intensive therapy program of at least 3 hrs of therapy per day at least 5 days per week? Yes 6. The potential for patient to make measurable gains while on inpatient rehab is good 7. Anticipated functional outcomes upon discharge from inpatient rehab are modified independent and wheelchair level  with PT, modified independent and wheelchair level with OT, n/a with SLP. 8. Estimated rehab length of stay to reach the above functional goals is: 17-20 days 9. Does the patient have adequate social supports and living environment to accommodate these discharge functional goals? Yes 10. Anticipated D/C setting: Home 11. Anticipated post D/C treatments: HH therapy 12. Overall Rehab/Functional Prognosis: good  RECOMMENDATIONS: This patient's condition is appropriate for continued rehabilitative care in the following setting: CIR Patient has agreed to participate in recommended program. Yes Note that insurance prior authorization may be required for reimbursement for recommended care.  Comment: Neurology will need to determine whether this is an MS exacerbation or whether her progressive lower extremity weakness is related to her severe lumbar spinal stenosis. If it is due to lumbar spinal stenosis a neurosurgery consult may be needed to determine whether she is an operative candidate.    07/13/2015

## 2015-07-13 NOTE — Progress Notes (Signed)
Rehab Admissions Coordinator Note:  Patient was screened by Clois Dupes for appropriateness for an Inpatient Acute Rehab Consult per PT recommendation.  At this time, we are recommending Inpatient Rehab consult. Please place order . I will contact Attending team for order.  Clois Dupes 07/13/2015, 12:36 PM  I can be reached at [phone].

## 2015-07-13 NOTE — Progress Notes (Signed)
Patient arrived to 5C18 AAOx4 and pleasant. Vitals taken and call bell is by her side. Skin cracking and drying around her lower legs. Back pain has decreased since placed in the bed. Will continue to monitor. Kayleigh Broadwell, [place] Scrape, RN

## 2015-07-13 NOTE — ED Notes (Signed)
Pt refused IV  

## 2015-07-13 NOTE — Consult Note (Signed)
NEURO HOSPITALIST CONSULT NOTE   Requestig physician: Family medicine teaching service   Reason for Consult: MS   History obtained from:    Chart & Patient   HPI:                                                                                                                                          Barbara [name] is an 60 y.o. female with history of multiple sclerosis in cervical spine and brain, lumbar & cervical spinal stenosis with cervical surgery in 2013 and  L2-4 laminectomy done at [facility] in 2014, hypertension, obesity, gout, and chronic venous insufficiency presenting with 5 days of leg weakness.  At baseline, patient able to transfer from wheelchair to bed or toilet, but unable to do this over the last several days. Reports chronic intermittent bladder incontinence, but unable to tell if it's been worse since lower extremity weakness started, as she's been restricting oral intake of solids and liquids so as to not have to go to the bathroom while she's been confined to bed. No fevers. No bowel incontinence. Was on Copaxin in 2014. Followed by Dr. Marjory Lies, [facility] who recommended patient come in for further evaluation.  Per patient she was baseline on Friday and awoke in the middle of the night feeling chills and shivering. She then noted she could not move her legs and called her family who brought her to the hospital.   Past Medical History  Diagnosis Date  . Lumbar spondylosis   . Spinal stenosis   . Weakness   . Numbness   . Lower back pain   . Gout   . MS (multiple sclerosis) [facility])     Past Surgical History  Procedure Laterality Date  . Cervical disc surgery      Qusetions C5-6 C6-7 and C7-T1    Family History  Problem Relation Age of Onset  . Heart disease Mother   . Diabetes Mother   . Cancer Father      Social History:  reports that she has never smoked. She has never used smokeless tobacco. She reports that she does not drink  alcohol or use illicit drugs.  No Known Allergies  MEDICATIONS:  Prior to Admission:  Prescriptions prior to admission  Medication Sig Dispense Refill Last Dose  . albuterol (PROVENTIL HFA;VENTOLIN HFA) 108 (90 BASE) MCG/ACT inhaler Inhale 2 puffs into the lungs every 4 (four) hours as needed for wheezing or shortness of breath. 1 Inhaler 0 NOT USED  . aspirin EC 325 MG tablet Take 325 mg by mouth daily.   Past Week at Unknown time  . calcium-vitamin D (OSCAL WITH D) 500-200 MG-UNIT per tablet Take 1 tablet by mouth daily.     Past Week at Unknown time  . COCONUT OIL PO Take 1 tablet by mouth daily.   Past Week at Unknown time  . Coenzyme Q10 (CO Q 10 PO) Take 1 tablet by mouth daily.   Past Week at Unknown time  . colchicine 0.6 MG tablet Take 1.2 mg by mouth daily.    07/12/2015 at Unknown time  . diazepam (VALIUM) 5 MG tablet TAKE 1 TABLET BY MOUTH EVERY 6 HOURS AS NEEDED FOR LEG SPASMS 90 tablet 5 07/12/2015 at Unknown time  . furosemide (LASIX) 20 MG tablet Take 1 tablet (20 mg total) by mouth daily. 30 tablet 3 Past Week at Unknown time  . gabapentin (NEURONTIN) 300 MG capsule TAKE 1 CAPSULE BY MOUTH 3 TIMES DAILY. 270 capsule 3 07/12/2015 at Unknown time  . lisinopril (PRINIVIL,ZESTRIL) 10 MG tablet TAKE 1 TABLET BY MOUTH DAILY. (Patient taking differently: TAKE 2 TABLETS BY MOUTH DAILY.) 90 tablet 3 07/12/2015 at Unknown time  . methocarbamol (ROBAXIN) 750 MG tablet Take 2 tablets (1,500 mg total) by mouth 3 (three) times daily. 540 tablet 3 07/12/2015 at Unknown time  . oxybutynin (DITROPAN-XL) 5 MG 24 hr tablet Take 1 tablet (5 mg total) by mouth at bedtime. 30 tablet 6 07/11/2015 at Unknown time  . predniSONE (DELTASONE) 20 MG tablet TAKE 1 TABLET BY MOUTH 3 TIMES DAILY AS NEEDED FOR MS FLARE (Patient taking differently: TAKE 1 TABLET BY MOUTH 3 TIMES DAILY FOR MS FLARE) 45 tablet 3  07/12/2015 at Unknown time  . traMADol (ULTRAM) 50 MG tablet TAKE 1 TABLET BY MOUTH 4 TIMES DAILY 360 tablet 0 07/12/2015 at Unknown time  . doxycycline (VIBRA-TABS) 100 MG tablet Take 1 tablet (100 mg total) by mouth 2 (two) times daily. 20 tablet 0 Taking  . Glatiramer Acetate (COPAXONE [place]) Inject into the skin.   not started  . guaiFENesin-codeine 100-10 MG/5ML syrup Take 5-10 mLs by mouth every 6 (six) hours as needed for cough. 120 mL 0 Taking   Scheduled: . aspirin EC  325 mg Oral Daily  . enoxaparin (LOVENOX) injection  40 mg Subcutaneous Daily  . gabapentin  300 mg Oral TID  . methocarbamol  1,500 mg Oral 3 times per day  . methylPREDNISolone (SOLU-MEDROL) injection  500 mg Intravenous Daily  . oxybutynin  5 mg Oral QHS     ROS:  History obtained from the patient  General ROS: negative for - chills, fatigue, fever, night sweats, weight gain or weight loss Psychological ROS: negative for - behavioral disorder, hallucinations, memory difficulties, mood swings or suicidal ideation Ophthalmic ROS: negative for - blurry vision, double vision, eye pain or loss of vision ENT ROS: negative for - epistaxis, nasal discharge, oral lesions, sore throat, tinnitus or vertigo Allergy and Immunology ROS: negative for - hives or itchy/watery eyes Hematological and Lymphatic ROS: negative for - bleeding problems, bruising or swollen lymph nodes Endocrine ROS: negative for - galactorrhea, hair pattern changes, polydipsia/polyuria or temperature intolerance Respiratory ROS: negative for - cough, hemoptysis, shortness of breath or wheezing Cardiovascular ROS: negative for - chest pain, dyspnea on exertion, edema or irregular heartbeat Gastrointestinal ROS: negative for - abdominal pain, diarrhea, hematemesis, nausea/vomiting or stool incontinence Genito-Urinary ROS:  negative for - dysuria, hematuria, incontinence or urinary frequency/urgency Musculoskeletal ROS: negative for - joint swelling or muscular weakness Neurological ROS: as noted in HPI Dermatological ROS: negative for rash and skin lesion changes   Blood pressure 140/82, pulse 73, temperature 99 F (37.2 C), temperature source Oral, resp. rate 18, height  (1.6 m), weight 118.389 kg (261 lb), last menstrual period 10/07/2010, SpO2 93 %.   Neurologic Examination:                                                                                                      HEENT-  Normocephalic, no lesions, without obvious abnormality.  Normal external eye and conjunctiva.  Normal TM's bilaterally.  Normal auditory canals and external ears. Normal external nose, mucus membranes and septum.  Normal pharynx. Cardiovascular- S1, S2 normal, pulses palpable throughout   Lungs- chest clear, no wheezing, rales, normal symmetric air entry Abdomen- normal findings: bowel sounds normal Extremities- no edema Lymph-no adenopathy palpable Musculoskeletal-no joint tenderness, deformity or swelling Skin-warm and dry, no hyperpigmentation, vitiligo, or suspicious lesions  Neurological Examination Mental Status: Alert, oriented, thought content appropriate.  Speech fluent without evidence of aphasia.  Able to follow 3 step commands without difficulty. Cranial Nerves: II: Discs flat bilaterally; Visual fields grossly normal, pupils equal, round, reactive to light and accommodation III,IV, VI: ptosis not present, extra-ocular motions intact bilaterally V,VII: smile symmetric, facial light touch sensation normal bilaterally VIII: hearing normal bilaterally IX,X: uvula rises symmetrically XI: bilateral shoulder shrug XII: midline tongue extension Motor: UE 5/5 bilaterally, 2/5 hip flexion bilaterally, knee flexion 2/5 bilaterally, knee flexion 4/5, PF 4/5 and DF 2/5 Sensory: Pinprick and light touch intact  throughout, bilaterally Deep Tendon Reflexes: 2+ and symmetric throughout with no AJ Plantars: mute Cerebellar: normal finger-to-nose  Gait: not tested      Lab Results: Basic Metabolic Panel:  Recent Labs Lab 07/12/15 2230 07/13/15 0129 07/13/15 0319  NA 146*  --  144  K 4.1  --  3.4*  CL 102  --  102  CO2 34*  --  28  GLUCOSE 91  --  99  BUN 20  --  22*  CREATININE 0.94 0.93 0.91  CALCIUM 9.6  --  9.4    Liver Function Tests: No results for input(s): AST, ALT, ALKPHOS, BILITOT, PROT, ALBUMIN in the last 168 hours. No results for input(s): LIPASE, AMYLASE in the last 168 hours. No results for input(s): AMMONIA in the last 168 hours.  CBC:  Recent Labs Lab 07/12/15 2230 07/13/15 0129 07/13/15 0319  WBC 12.5* 13.4* 13.4*  NEUTROABS 9.4*  --   --   HGB 14.5 14.7 14.6  HCT 43.8 43.9 43.5  MCV 90.5 90.0 89.7  PLT 202 200 198    Cardiac Enzymes: No results for input(s): CKTOTAL, CKMB, CKMBINDEX, TROPONINI in the last 168 hours.  Lipid Panel: No results for input(s): CHOL, TRIG, HDL, CHOLHDL, VLDL, LDLCALC in the last 168 hours.  CBG: No results for input(s): GLUCAP in the last 168 hours.  Microbiology: No results found for this or any previous visit.  Coagulation Studies: No results for input(s): LABPROT, INR in the last 72 hours.  Imaging: Mr Lodema Pilot Contrast  07/13/2015  CLINICAL DATA:  60 year old female with multiple sclerosis, worsening lower extremity weakness. Suspected MS flare. Initial encounter. EXAM: MRI HEAD WITHOUT AND WITH CONTRAST TECHNIQUE: Multiplanar, multiecho pulse sequences of the brain and surrounding structures were obtained without and with intravenous contrast. CONTRAST:  39mL MULTIHANCE GADOBENATE DIMEGLUMINE 529 MG/ML IV SOLN COMPARISON:  Brain MRI 03/05/2015, 05/24/2010. FINDINGS: Major intracranial vascular flow voids are stable. No restricted diffusion to suggest acute infarction. No midline shift, mass effect, evidence of  mass lesion, ventriculomegaly, extra-axial collection or acute intracranial hemorrhage. Cervicomedullary junction and pituitary are within normal limits. Stable cerebral volume. Mild periventricular white matter T2 and FLAIR hyperintensity is stable since 09/18/14, most pronounced along the body of the left lateral ventricle. No new cerebral white matter lesions. No restricted or enhancing brain lesions. Chronic nonspecific T2 hyperintensity in the globus pallidus (series 6, image 14) is stable. No definite brainstem or cerebellar signal abnormality. No chronic cerebral blood products. Visualized cervical spine remarkable for chronic dorsal spinal cord demyelinating lesion at C2-C3 (series 2, image 111). Visible internal auditory structures appear normal. Stable trace mastoid fluid. Asymmetric T2 hyperintensity of the right optic nerve appears stable (series 11, image 25) with otherwise negative orbit soft tissues. Bone marrow signal is stable and within normal limits. Soft tissues IMPRESSION: 1. Stable MRI appearance of the brain since 03/05/2015. No new or active demyelinating brain lesion identified. 2. No new intracranial abnormality. Electronically Signed   By: Odessa Fleming M.D.   On: 07/13/2015 06:39       Assessment and plan per attending neurologist  Felicie Morn PA-C [facility] [phone]  07/13/2015, 11:35 AM   Assessment/Plan: 60 YO female with known MS with fairly sudden onset of increased LE weakness. MRI brain non revealing. Currently on Solumedrol 500 mg BID for total of 6 doses.   Recommend: 1) MRI thoracic and cervical spine with contrast.  2) PT/OT 3) Solumedrol 500 mg BID for 6 doses.  4) Protonix 40 mg IV daily  I personally participated in this patient's evaluation and management, including formulating the above clinical impression and management recommendations.  Venetia Maxon M.D. [facility] [phone]

## 2015-07-13 NOTE — Progress Notes (Signed)
Family Medicine Teaching Service Daily Progress Note Intern Pager: [phone]  Patient name: Barbara [name] Medical record number: [id number] Date of birth: 03-02-1956 Age: 60 y.o. Gender: female  Primary Care Provider: Sanjuana Letters, MD Consultants: Neurology Code Status: DNR  Pt Overview and Major Events to Date:  3/8: Admit for worsening LE weakness likely MS flare   Barbara [name] is a 60 y.o. female presenting with worsening LE weakness. PMH is significant for MS, HTN, spinal stenosis of lumbar region and cervical region, chronic venous stasis.   Worsening LE weakness in the setting of MS: Possible MS flare. Reasons for flare could be secondary to recent viral illness vs medication noncompliance vs advancing disease state. Patient does have history of severe spinal stenosis of lumbar region; rectal exam with possible low tone. Not on MS medication currently due to financial difficulties without insurance. Was on Copaxin in 2014. Followed by Dr. Marjory Lies, [facility] who recommended patient come in for further evaluation. Clinically stable. MRI brain without new lesions.  - IV Solumedrol 500mg  daily could increase to as much as 1000mg  daily x 5 days - consult neurology, appreciate reccs - Robaxin 1,500mg  TID  - Gabapentin 300mg  TID  - ASA 325mg  daily - PT/OT consult - SW consult for financial assistance  - If not responding to steroids next step would be plasma exchange - consider MRI lumbar spine to rule out worsening of spinal   HTN: Elevated in ED with SBP 180s/110s. Improved and stable this AM - holding Lisinopril due to elevated Cr and patient will get MRI w/wo contrast on admission  - Hydralazine PRN SBP >180 and/or DBP >110 - restart oral medications when possible  Hypokalemia: 3.4 this AM  - Kdur once - monitor with labs  Elevated Cr: 0.9 from 0.7 in October. Stable this AM Cr 0.91  - monitor with labs - avoid nephrotoxic meds  Urinary  Incontinence: chronic issue which seems to be worsening per patient.  - continue home Oxybutynin - UA ordered in ED not yet collected   Leukocytosis: Mildly elevated at 12.5 > 13.4. Patient has been taking Prednisone at home which is most likely reason for elevation. No clinical signs of infection. Afebrile. Currently on IV Solumedrol - will monitor with labs  Chronic Venous Stasis: No signs of infection. Takes Lasix daily for LE edema - hold Lasix due to elevated Cr   FEN/GI: Heart diet, saline lock Prophylaxis: Lovenox Sub Q  Disposition: admit to inpatient service; discharge pending clinical improvement.   Subjective:  Doing well. Has slight HA this morning.   Objective: Temp:  [97.9 F (36.6 C)-98.5 F (36.9 C)] 98.5 F (36.9 C) (03/09 0530) Pulse Rate:  [57-73] 64 (03/09 0530) Resp:  [11-19] 18 (03/09 0530) BP: (146-188)/(80-98) 151/80 mmHg (03/09 0530) SpO2:  [93 %-97 %] 95 % (03/09 0530) Weight:  [118.389 kg (261 lb)] 118.389 kg (261 lb) (03/08 2005) Physical Exam: General: NAD, lying in bed, obese female Cardiovascular:  systolic murmur 2/6 heard equally in right and left upper sternal border Respiratory: normal effort CTAB Abdomen: soft, NT, ND MSK: LE- skin changes from chronic venous insufficiency, patch of erythema on left shin and calf and patch of erythema but milder on right shin and right calf, no increased warmth or tenderness to palpation. Pitting edema on feet bilaterally with trace pitting edema up to mid shin.  Extremities: CN 2-12 intact (did not formally evaluate visual fields), strength 5/5 in upper extremities bilaterally, sensation intact to light touch in  upper extremities. LE: ~1-2/5 strength in lower extremities bilaterally; sensation to light touch decreased in both LE compared to UE but worse in LLE compare to RLE. Patellar reflexes 2+ bilaterally. Cerebellar test: wnl Rectal exam: ?low tone; able to bear down upon request  Laboratory:  Recent  Labs Lab 07/12/15 2230 07/13/15 0129 07/13/15 0319  WBC 12.5* 13.4* 13.4*  HGB 14.5 14.7 14.6  HCT 43.8 43.9 43.5  PLT 202 200 198    Recent Labs Lab 07/12/15 2230 07/13/15 0129 07/13/15 0319  NA 146*  --  144  K 4.1  --  3.4*  CL 102  --  102  CO2 34*  --  28  BUN 20  --  22*  CREATININE 0.94 0.93 0.91  CALCIUM 9.6  --  9.4  GLUCOSE 91  --  99   MRI Brain:  FINDINGS: Major intracranial vascular flow voids are stable. No restricted diffusion to suggest acute infarction. No midline shift, mass effect, evidence of mass lesion, ventriculomegaly, extra-axial collection or acute intracranial hemorrhage. Cervicomedullary junction and pituitary are within normal limits. Stable cerebral volume.  Mild periventricular white matter T2 and FLAIR hyperintensity is stable since 10-03-2014, most pronounced along the body of the left lateral ventricle. No new cerebral white matter lesions. No restricted or enhancing brain lesions. Chronic nonspecific T2 hyperintensity in the globus pallidus (series 6, image 14) is stable. No definite brainstem or cerebellar signal abnormality. No chronic cerebral blood products.  Visualized cervical spine remarkable for chronic dorsal spinal cord demyelinating lesion at C2-C3 (series 2, image 111).  Visible internal auditory structures appear normal. Stable trace mastoid fluid. Asymmetric T2 hyperintensity of the right optic nerve appears stable (series 11, image 25) with otherwise negative orbit soft tissues. Bone marrow signal is stable and within normal limits. Soft tissues  IMPRESSION: 1. Stable MRI appearance of the brain since 03/05/2015. No new or active demyelinating brain lesion identified. 2. No new intracranial abnormality.  Palma Holter, MD 07/13/2015, 6:44 AM PGY-1, [facility] FPTS Intern pager: [phone], text pages welcome

## 2015-07-13 NOTE — Evaluation (Signed)
Physical Therapy Evaluation Patient Details Name: Barbara [name] MRN: [id number] DOB: Oct 11, 1955 Today's Date: 07/13/2015   History of Present Illness  Barbara [name] is a 60 y.o. female presenting with worsening LE weakness. PMH is significant for MS, HTN, spinal stenosis of lumbar region and cervical region, chronic venous stasis  Clinical Impression  Pt admitted with the above complications. Pt currently with functional limitations due to the deficits listed below (see PT Problem List). Very pleasant individual, eager to work with therapy and get OOB since she has not been up since Saturday 3/4. PTA she reports being independent with stand vs squat pivot transfers to her wheelchair. Today, she is unable to tolerate standing or pivoting to a recliner and required [place] Dec plus lift to safely maneuver into chair. She demonstrates significant LE weakness Lt>Rt. Mrs. Maldonado complains of Lt ankle pain, impeding weight-bearing ability, and I notice this is edematous and very tender to palpation along lateral malleolus and anterior talus, consider x-ray. Pt will benefit from skilled PT to increase their independence and safety with mobility to allow discharge to the venue listed below.       Follow Up Recommendations CIR    Equipment Recommendations  None recommended by PT    Recommendations for Other Services Rehab consult;OT consult     Precautions / Restrictions Precautions Precautions: Fall Restrictions Weight Bearing Restrictions: No      Mobility  Bed Mobility Overal bed mobility: Needs Assistance Bed Mobility: Rolling;Sidelying to Sit Rolling: Min guard Sidelying to sit: Min assist       General bed mobility comments: Able to roll onto LT side but could not flex Lt hip enough to bring off bed, requiring min assist for this LE. Good UE strength to push self into seated position. Heavy use of rail.  Transfers Overall transfer level: Needs assistance Equipment used: Rolling  walker (2 wheeled) Transfers: Sit to/from Stand Sit to Stand: Max assist;+2 physical assistance         General transfer comment: Attempted several trials of sit<>stand from bed with cues for anterior weight shift. Could not clear buttocks. Limited by weakness and especially pain in Lt ankle upon weight-bearing. VC for hand placement. Used sara + for transfer. Pt demonstrates good effort with boosting with RLE and UEs for support with this device. Able to perform mini-squats in upright position with support from sara plus.  Ambulation/Gait                Stairs            Wheelchair Mobility    Modified Rankin (Stroke Patients Only)       Balance Overall balance assessment: Needs assistance Sitting-balance support: No upper extremity supported;Feet supported Sitting balance-Leahy Scale: Fair Sitting balance - Comments: decreased trucal control   Standing balance support: Bilateral upper extremity supported Standing balance-Leahy Scale: Zero                               Pertinent Vitals/Pain Pain Assessment: 0-10 Pain Score: 5  Pain Location: back and Lt ankle Pain Descriptors / Indicators: Aching Pain Intervention(s): Monitored during session;Repositioned    Home Living Family/patient expects to be discharged to:: Private residence Living Arrangements: Alone Available Help at Discharge: Family;Available PRN/intermittently Type of Home: House Home Access: Other (comment) (lift chair)     Home Layout: One level Home Equipment: Wheelchair - [employer] - 2 wheels;Walker - 4 wheels;Bedside commode;Shower seat;Tub bench  Prior Function Level of Independence: Independent with assistive device(s)         Comments: Able to bath/dress self at home PTA, stand pivot transfers to w/c     Hand Dominance   Dominant Hand: Left    Extremity/Trunk Assessment   Upper Extremity Assessment: Defer to OT evaluation           Lower  Extremity Assessment: RLE deficits/detail;LLE deficits/detail RLE Deficits / Details: MMT: hip flexion 2/5, knee extension 3+/5, knee flexion, 2/5, ankle dorsi and plantarflexion 1/5, abduction/adduction of hip 2+/5 LLE Deficits / Details: Left ankle notably edematous compared to Rt, Very tender to palpation along lateral maleolus extending anteriorly across talus. MMT: hip flexion 1/5, knee extension 3/5, knee flexion, 2-/5, ankle dorsi and plantarflexion 1/5, abduction/adduction of hip 2/5     Communication   Communication: No difficulties  Cognition Arousal/Alertness: Awake/alert Behavior During Therapy: WFL for tasks assessed/performed Overall Cognitive Status: Within Functional Limits for tasks assessed                      General Comments General comments (skin integrity, edema, etc.): Patient highly motivated. states she has been unable to get OOB since saturday. Very independent prior to admission    Exercises General Exercises - Lower Extremity Ankle Circles/Pumps: AROM;Both;10 reps;Seated (Limited ROM) Long Arc Quad: Strengthening;Both;10 reps;Seated Mini-Sqauts: Strengthening;Both;5 reps;Standing (with sara plus)      Assessment/Plan    PT Assessment Patient needs continued PT services  PT Diagnosis Difficulty walking;Acute pain;Generalized weakness   PT Problem List Decreased strength;Decreased range of motion;Decreased activity tolerance;Decreased balance;Decreased mobility;Obesity;Pain  PT Treatment Interventions DME instruction;Functional mobility training;Therapeutic activities;Therapeutic exercise;Balance training;Neuromuscular re-education;Patient/family education   PT Goals (Current goals can be found in the Care Plan section) Acute Rehab PT Goals Patient Stated Goal: Get my strength and independence back PT Goal Formulation: With patient Time For Goal Achievement: 07/27/15 Potential to Achieve Goals: Good    Frequency Min 3X/week   Barriers to  discharge Decreased caregiver support lives alone but apparently has strong family support.    Co-evaluation               End of Session Equipment Utilized During Treatment: Gait belt Activity Tolerance: Other (comment);Patient limited by pain (weakness, and Lt ankle pain) Patient left: in chair;with call bell/phone within reach;with chair alarm set Nurse Communication: Mobility status (Lt ankle pain)         Time: 9201-0071 PT Time Calculation (min) (ACUTE ONLY): 50 min   Charges:   PT Evaluation $PT Eval Moderate Complexity: 1 Procedure PT Treatments $Therapeutic Activity: 23-37 mins   PT G CodesBerton Mount 07/13/2015, 11:10 AM  Charlsie Merles, PT [phone]

## 2015-07-14 DIAGNOSIS — R29898 Other symptoms and signs involving the musculoskeletal system: Secondary | ICD-10-CM | POA: Diagnosis not present

## 2015-07-14 DIAGNOSIS — R32 Unspecified urinary incontinence: Secondary | ICD-10-CM | POA: Diagnosis not present

## 2015-07-14 DIAGNOSIS — I10 Essential (primary) hypertension: Secondary | ICD-10-CM | POA: Diagnosis not present

## 2015-07-14 DIAGNOSIS — G35 Multiple sclerosis: Secondary | ICD-10-CM | POA: Diagnosis not present

## 2015-07-14 DIAGNOSIS — G8220 Paraplegia, unspecified: Secondary | ICD-10-CM | POA: Diagnosis not present

## 2015-07-14 DIAGNOSIS — R531 Weakness: Secondary | ICD-10-CM | POA: Diagnosis not present

## 2015-07-14 LAB — BASIC METABOLIC PANEL
Anion gap: 10 (ref 5–15)
BUN: 20 mg/dL (ref 6–20)
CHLORIDE: 104 mmol/L (ref 101–111)
CO2: 29 mmol/L (ref 22–32)
CREATININE: 1.05 mg/dL — AB (ref 0.44–1.00)
Calcium: 9.6 mg/dL (ref 8.9–10.3)
GFR calc non Af Amer: 57 mL/min — ABNORMAL LOW (ref >60)
Glucose, Bld: 231 mg/dL — ABNORMAL HIGH (ref 65–99)
Potassium: 3.7 mmol/L (ref 3.5–5.1)
SODIUM: 143 mmol/L (ref 135–145)

## 2015-07-14 LAB — CBC
HCT: 44.3 % (ref 36.0–46.0)
Hemoglobin: 14.5 g/dL (ref 12.0–15.0)
MCH: 29.4 pg (ref 26.0–34.0)
MCHC: 32.7 g/dL (ref 30.0–36.0)
MCV: 89.7 fL (ref 78.0–100.0)
PLATELETS: 198 10*3/uL (ref 150–400)
RBC: 4.94 MIL/uL (ref 3.87–5.11)
RDW: 14.1 % (ref 11.5–15.5)
WBC: 11.3 10*3/uL — AB (ref 4.0–10.5)

## 2015-07-14 LAB — URINE MICROSCOPIC-ADD ON: BACTERIA UA: NONE SEEN

## 2015-07-14 LAB — URINALYSIS, ROUTINE W REFLEX MICROSCOPIC
Bilirubin Urine: NEGATIVE
GLUCOSE, UA: NEGATIVE mg/dL
KETONES UR: NEGATIVE mg/dL
LEUKOCYTES UA: NEGATIVE
NITRITE: NEGATIVE
PROTEIN: NEGATIVE mg/dL
Specific Gravity, Urine: 1.016 (ref 1.005–1.030)
pH: 6 (ref 5.0–8.0)

## 2015-07-14 MED ORDER — ENOXAPARIN SODIUM 60 MG/0.6ML ~~LOC~~ SOLN
60.0000 mg | Freq: Every day | SUBCUTANEOUS | Status: DC
  Administered 2015-07-15 – 2015-07-19 (×5): 60 mg via SUBCUTANEOUS
  Filled 2015-07-14 (×2): qty 1
  Filled 2015-07-14: qty 0.6
  Filled 2015-07-14: qty 1
  Filled 2015-07-14: qty 0.6
  Filled 2015-07-14: qty 1
  Filled 2015-07-14 (×2): qty 0.6
  Filled 2015-07-14: qty 1
  Filled 2015-07-14: qty 0.6

## 2015-07-14 MED ORDER — PANTOPRAZOLE SODIUM 40 MG PO TBEC
40.0000 mg | DELAYED_RELEASE_TABLET | Freq: Every day | ORAL | Status: DC
  Administered 2015-07-15 – 2015-07-19 (×5): 40 mg via ORAL
  Filled 2015-07-14 (×5): qty 1

## 2015-07-14 MED ORDER — TRAMADOL HCL 50 MG PO TABS
50.0000 mg | ORAL_TABLET | Freq: Four times a day (QID) | ORAL | Status: DC
  Administered 2015-07-14 – 2015-07-19 (×21): 50 mg via ORAL
  Filled 2015-07-14 (×20): qty 1

## 2015-07-14 MED ORDER — SODIUM CHLORIDE 0.9 % IV SOLN
INTRAVENOUS | Status: DC
  Administered 2015-07-14: 21:00:00 via INTRAVENOUS

## 2015-07-14 NOTE — Progress Notes (Signed)
Family Medicine Teaching Service Daily Progress Note Intern Pager: [phone]  Patient name: Barbara [name] Medical record number: [id number] Date of birth: May 02, 1956 Age: 60 y.o. Gender: female  Primary Care Provider: Sanjuana Letters, MD Consultants: Neurology Code Status: DNR  Pt Overview and Major Events to Date:  3/8: Admit for worsening LE weakness likely MS flare   Barbara [name] is a 60 y.o. female presenting with worsening LE weakness. PMH is significant for MS, HTN, spinal stenosis of lumbar region and cervical region, chronic venous stasis.   Worsening LE weakness in the setting of MS: Possible MS flare. Reasons for flare could be secondary to recent viral illness vs medication noncompliance vs advancing disease state. Patient does have history of severe spinal stenosis of lumbar region; could be possible worsening of stenosis. MRI brain without new lesions. Clinically stable with some improvement of weakness.   - IV Solumedrol 500mg  BID for total 6 doses per Neurology - switch to PO protonix  - consult neurology, appreciate reccs: MRI thoracic and cervical spine with contrast (ordered)- unable to obtain until 72hrs after MRI brain obtained on admission - Robaxin 1,500mg  TID  - Gabapentin 300mg  TID  - PT/OT consult: CIR - CIR consulted: accepted for inpatient therapy - SW consult for financial assistance   HTN: Overall stable.  - holding Lisinopril due to elevated Cr and patient will get MRI w/wo contrast on admission  - Hydralazine PRN SBP >180 and/or DBP >110 - restart oral medications when possible  AKI: 0.9 from 0.7 in October on admission. 1.05 this AM. Likely due to contrast from MRI brain  - monitor with labs - NS @50cc /hr - avoid nephrotoxic meds  Urinary Incontinence: chronic issue which seems to be worsening per patient.  - will discontinue Oxybutynin  - UA ordered in ED not yet collected; will ask to do I/O cath due to incontinence    Leukocytosis: Mildly elevated at 11.3. Patient has been taking Prednisone at home which is most likely reason for elevation. No clinical signs of infection. Afebrile. Currently on IV Solumedrol - will monitor with labs  Chronic Venous Stasis: No signs of infection. Takes Lasix daily for LE edema - hold Lasix due to elevated Cr   Chronic Back Pain:  - home Ultram 50mg  q 6 hrs  MS: Not on MS medication currently due to financial difficulties without insurance. Was on Copaxin in 2014. Followed by Dr. Marjory Lies, [facility] who recommended patient come in for further evaluation.  Hypokalemia, resolved: 3.4 > 3.7 s/p Kdur  - monitor with labs  FEN/GI: Heart diet, NS @ 50cc/hr Prophylaxis: Lovenox Sub Q  Disposition: admit to inpatient service; discharge pending clinical improvement.   Subjective:  Doing well. Notes of chronic back pain; asked if we could restart home Ultram   Objective: Temp:  [97.3 F (36.3 C)-99 F (37.2 C)] 97.3 F (36.3 C) (03/10 0554) Pulse Rate:  [67-78] 67 (03/10 0554) Resp:  [16-18] 16 (03/10 0554) BP: (117-153)/(70-83) 121/74 mmHg (03/10 0554) SpO2:  [64 %-95 %] 64 % (03/10 0554) Physical Exam: General: NAD, lying in bed, obese female Cardiovascular:  systolic murmur 2/6 heard equally in right and left upper sternal border Respiratory: normal effort CTAB Abdomen: soft, NT, ND MSK: LE- skin changes from chronic venous insufficiency, patch of erythema on left shin and calf and patch of erythema but milder on right shin and right calf, no increased warmth or tenderness to palpation. Pitting edema on feet bilaterally with trace pitting edema up to mid  shin.  Extremities: CN 2-12 intact (did not formally evaluate visual fields), strength 5/5 in upper extremities bilaterally, sensation intact to light touch in upper extremities. LE: ~2-3/5 strength in lower extremities bilaterally (improved from yesterday)--hip flexors 2/5; knee extensors 3/5, plantar  flexion 4-5/5; sensation to light touch decreased in both LE compared to UE but worse in LLE compare to RLE.   Laboratory:  Recent Labs Lab 07/13/15 0129 07/13/15 0319 07/14/15 0400  WBC 13.4* 13.4* 11.3*  HGB 14.7 14.6 14.5  HCT 43.9 43.5 44.3  PLT 200 198 198    Recent Labs Lab 07/12/15 2230 07/13/15 0129 07/13/15 0319 07/14/15 0400  NA 146*  --  144 143  K 4.1  --  3.4* 3.7  CL 102  --  102 104  CO2 34*  --  28 29  BUN 20  --  22* 20  CREATININE 0.94 0.93 0.91 1.05*  CALCIUM 9.6  --  9.4 9.6  GLUCOSE 91  --  99 231*   MRI Brain:  FINDINGS: Major intracranial vascular flow voids are stable. No restricted diffusion to suggest acute infarction. No midline shift, mass effect, evidence of mass lesion, ventriculomegaly, extra-axial collection or acute intracranial hemorrhage. Cervicomedullary junction and pituitary are within normal limits. Stable cerebral volume.  Mild periventricular white matter T2 and FLAIR hyperintensity is stable since 09/05/2014, most pronounced along the body of the left lateral ventricle. No new cerebral white matter lesions. No restricted or enhancing brain lesions. Chronic nonspecific T2 hyperintensity in the globus pallidus (series 6, image 14) is stable. No definite brainstem or cerebellar signal abnormality. No chronic cerebral blood products.  Visualized cervical spine remarkable for chronic dorsal spinal cord demyelinating lesion at C2-C3 (series 2, image 111).  Visible internal auditory structures appear normal. Stable trace mastoid fluid. Asymmetric T2 hyperintensity of the right optic nerve appears stable (series 11, image 25) with otherwise negative orbit soft tissues. Bone marrow signal is stable and within normal limits. Soft tissues  IMPRESSION: 1. Stable MRI appearance of the brain since 03/05/2015. No new or active demyelinating brain lesion identified. 2. No new intracranial abnormality.  Palma Holter,  MD 07/14/2015, 6:20 AM PGY-1, [facility] Family Medicine FPTS Intern pager: [phone], text pages welcome

## 2015-07-14 NOTE — Progress Notes (Signed)
Subjective: No change in strength. No complaints.   Exam: Filed Vitals:   07/14/15 0156 07/14/15 0554  BP: 153/79 121/74  Pulse: 70 67  Temp: 98.1 F (36.7 C) 97.3 F (36.3 C)  Resp: 18 16   Mental Status: Alert, oriented, thought content appropriate. Speech fluent without evidence of aphasia. Able to follow 3 step commands without difficulty. Cranial Nerves: II: Discs flat bilaterally; Visual fields grossly normal, pupils equal, round, reactive to light and accommodation III,IV, VI: ptosis not present, extra-ocular motions intact bilaterally V,VII: smile symmetric, facial light touch sensation normal bilaterally VIII: hearing normal bilaterally IX,X: uvula rises symmetrically XI: bilateral shoulder shrug XII: midline tongue extension Motor: UE 5/5 bilaterally, 2/5 hip flexion bilaterally, knee flexion 2/5 bilaterally, knee flexion 4/5, PF 4/5 and DF 2/5 Sensory: Pinprick and light touch intact throughout, bilaterally Deep Tendon Reflexes: 2+ and symmetric throughout with no AJ Plantars: mute Cerebellar: normal finger-to-nose  Gait: not tested    Pertinent Labs: none  Felicie Morn PA-C [facility] [phone]     Impression/Recommendations: 60 YO female with known MS with fairly sudden onset of increased LE weakness. MRI brain non revealing. Currently on Solumedrol 500 mg BID for total of 6 doses.   Recommend: 1) MRI thoracic and cervical spine with contrast.  2) PT/OT 3) Solumedrol 500 mg BID for 6 doses.  4) Protonix 40 mg IV daily  I personally participated in this patient's evaluation and management, including formulating above clinical impression and management recommendations.  Venetia Maxon M.D. [facility] [phone]  07/14/2015, 9:38 AM

## 2015-07-14 NOTE — Care Management Important Message (Signed)
Important Message  Patient Details  Name: Barbara [name] MRN: [id number] Date of Birth: 01-Jul-1955   Medicare Important Message Given:  Yes    Barbara [name] 07/14/2015, 1:00 PM

## 2015-07-14 NOTE — Progress Notes (Signed)
Rehab admissions - I am following for potential inpatient rehab admission.  Awaiting OT evaluation and recommendations.  Awaiting MRI thoracic and cervical spine. See rehab consult done by Dr. Wynn Banker yesterday.   I will follow up with patient today.  Call me for questions.  #[phone]

## 2015-07-14 NOTE — Evaluation (Signed)
Occupational Therapy Evaluation Patient Details Name: Barbara [name] MRN: [id number] DOB: April 08, 1956 Today's Date: 07/14/2015    History of Present Illness Barbara [name] is a 60 y.o. female presenting with worsening LE weakness. PMH is significant for MS, HTN, spinal stenosis of lumbar region and cervical region, chronic venous stasis   Clinical Impression   Pt reports she was independent with ADLs PTA. Pt very motivated to return to PLOF and willing to attempt transfers and participate in therapy. Pt feels she is doing slightly better today; able to move L foot some sitting EOB and required no physical assist, just min guard for safety, with all bed mobility. Attempted sit to stand from EOB x 2 with max assist +1; unsuccessful at this time. Feel pt would benefit from CIR level therapies to maximize independence and safety with ADLs and functional mobility prior to return home. Pt would benefit from continued skilled OT to address established goals.     Follow Up Recommendations  CIR;Supervision/Assistance - 24 hour    Equipment Recommendations  None recommended by OT    Recommendations for Other Services Rehab consult     Precautions / Restrictions Precautions Precautions: Fall Restrictions Weight Bearing Restrictions: No      Mobility Bed Mobility Overal bed mobility: Needs Assistance Bed Mobility: Rolling;Sidelying to Sit;Sit to Supine Rolling: Min guard Sidelying to sit: Min guard;HOB elevated   Sit to supine: Min guard   General bed mobility comments: Heavy use of rails but overall pt min guard for safety. Pt able to pull self up in bed with use of bed rails. Increased time required.  Transfers Overall transfer level: Needs assistance               General transfer comment: Attempted sit to stand from EOB x 2; both attempts unsuccessful with max assist +1 provided.    Balance Overall balance assessment: Needs assistance Sitting-balance support: Feet  supported;No upper extremity supported Sitting balance-Leahy Scale: Fair                                      ADL Overall ADL's : Needs assistance/impaired Eating/Feeding: Set up;Sitting   Grooming: Set up;Sitting   Upper Body Bathing: Supervision/ safety;Sitting   Lower Body Bathing: Moderate assistance;Sitting/lateral leans   Upper Body Dressing : Supervision/safety;Set up;Sitting   Lower Body Dressing: Moderate assistance;Sitting/lateral leans                 General ADL Comments: No family present for OT eval. Pt reports she feels like she is doing a little better today; able to move L foot some sitting EOB. Attempted sit to stand from EOB x 2 and pt unsuccessful with max assist +1 provided.      Vision     Perception     Praxis      Pertinent Vitals/Pain Pain Assessment: 0-10 Pain Score: 2  Pain Location: back Pain Descriptors / Indicators: Sore Pain Intervention(s): Limited activity within patient's tolerance;Monitored during session     Hand Dominance Left   Extremity/Trunk Assessment Upper Extremity Assessment Upper Extremity Assessment: Overall WFL for tasks assessed (Pt reports numbness in fingers/hands; present PTA)   Lower Extremity Assessment Lower Extremity Assessment: Defer to PT evaluation   Cervical / Trunk Assessment Cervical / Trunk Assessment: Other exceptions Cervical / Trunk Exceptions: obesity   Communication Communication Communication: No difficulties   Cognition Arousal/Alertness: Awake/alert Behavior During Therapy: [facility]  for tasks assessed/performed Overall Cognitive Status: Within Functional Limits for tasks assessed                     General Comments       Exercises       Shoulder Instructions      Home Living Family/patient expects to be discharged to:: Private residence Living Arrangements: Alone Available Help at Discharge: Family;Available PRN/intermittently Type of Home: House Home  Access: Other (comment) (lift chair)     Home Layout: One level     Bathroom Shower/Tub: Walk-in shower (unable to get in shower recently; takes sponge baths)   Bathroom Toilet: Handicapped height Bathroom Accessibility: Yes How Accessible: Accessible via wheelchair Home Equipment: Wheelchair - manual;Walker - 2 wheels;Walker - 4 wheels;Bedside commode;Shower seat;Tub bench;Grab bars - tub/shower;Adaptive equipment Adaptive Equipment: Reacher        Prior Functioning/Environment Level of Independence: Independent with assistive device(s)        Comments: Able to bath/dress self at home PTA, stand pivot transfers to w/c    OT Diagnosis: Generalized weakness;Acute pain   OT Problem List: Decreased strength;Decreased range of motion;Impaired balance (sitting and/or standing);Decreased knowledge of use of DME or AE;Impaired sensation;Obesity;Pain   OT Treatment/Interventions: Self-care/ADL training;Therapeutic exercise;Energy conservation;DME and/or AE instruction;Therapeutic activities;Patient/family education;Balance training    OT Goals(Current goals can be found in the care plan section) Acute Rehab OT Goals Patient Stated Goal: Get my strength and independence back OT Goal Formulation: With patient Time For Goal Achievement: 07/28/15 Potential to Achieve Goals: Good ADL Goals Pt Will Perform Upper Body Bathing: with modified independence;sitting Pt Will Perform Lower Body Bathing: with supervision;sitting/lateral leans;sit to/from stand Pt Will Transfer to Toilet: with min guard assist;stand pivot transfer;bedside commode Pt Will Perform Toileting - Clothing Manipulation and hygiene: with supervision;sitting/lateral leans Pt/caregiver will Perform Home Exercise Program: Increased strength;Both right and left upper extremity;With theraband;Independently;With written HEP provided  OT Frequency: Min 2X/week   Barriers to D/C: Decreased caregiver support  pt lives alone        Co-evaluation              End of Session Equipment Utilized During Treatment: Gait belt  Activity Tolerance: Patient tolerated treatment well Patient left: in bed;with call bell/phone within reach   Time: 0928-1000 OT Time Calculation (min): 32 min Charges:  OT General Charges $OT Visit: 1 Procedure OT Evaluation $OT Eval Moderate Complexity: 1 Procedure OT Treatments $Therapeutic Activity: 8-22 mins G-Codes:     Gaye Alken M.S., OTR/L Pager: [phone]  07/14/2015, 10:14 AM

## 2015-07-14 NOTE — Progress Notes (Signed)
Inpatient Diabetes Program Recommendations  AACE/ADA: New Consensus Statement on Inpatient Glycemic Control (2015)  Target Ranges:  Prepandial:   less than 140 mg/dL      Peak postprandial:   less than 180 mg/dL (1-2 hours)      Critically ill patients:  140 - 180 mg/dL  Results for Barbara, [name] (MRN [id number]) as of 07/14/2015 10:34  Ref. Range 07/14/2015 04:00  Glucose Latest Ref Range: 65-99 mg/dL 025 (H)   Review of Glycemic Control  Diabetes history: NO Outpatient Diabetes medications: NA Current orders for Inpatient glycemic control: None  Inpatient Diabetes Program Recommendations: Correction (SSI): Patient is ordered Solumedrol 500 mg Q12H which is likely cause of hyperglycemia. Lab glucose was 231 mg/dl this morning. While inpatient and ordered steroids, please consider ordering CBGs with Novolog correction scale.  Thanks, Orlando Penner, RN, MSN, CDE Diabetes Coordinator Inpatient Diabetes Program [phone] (Team Pager from 8am to 5pm) [phone] ([facility] office) [phone] [facility] office) [phone] [facility] office)

## 2015-07-14 NOTE — Progress Notes (Signed)
Physical Therapy Treatment Patient Details Name: Barbara [name] MRN: [id number] DOB: Mar 21, 1956 Today's Date: 07/14/2015    History of Present Illness Barbara [name] is a 60 y.o. female presenting with worsening LE weakness.  Dx with MS flare. PMH is significant for MS, HTN, spinal stenosis of lumbar region and cervical region, chronic venous stasis    PT Comments    Pt is progressing slowly with mobility, however, is still very spastic and weak in bil legs.  She will need continued extensive therapy to return to her baseline level of function.  PT continues to recommend CIR level therapy.    Follow Up Recommendations  CIR     Equipment Recommendations  None recommended by PT    Recommendations for Other Services   NA     Precautions / Restrictions Precautions Precautions: Fall    Mobility  Bed Mobility Overal bed mobility: Needs Assistance Bed Mobility: Rolling;Sidelying to Sit Rolling: Min assist Sidelying to sit: Min assist       General bed mobility comments: Min assist to help support pelvis to get all the way to sidelying in bed.  Pt was able to position her trunk with both hands assited on the bed rail.  She does have occational spastic extension of legs while mobilizing.  Min assist from side lying at trunk to get to upright sitting. Heavy relinace on bil upper extremity support on rail to get to sitting EOB.  Significant extra time needed to complete task  Transfers Overall transfer level: Needs assistance   Transfers: Sit to/from Stand;Stand Pivot Transfers Sit to Stand: Mod assist;From elevated surface Stand pivot transfers: From elevated surface;+2 safety/equipment (used steady assisted standing frame)       General transfer comment: Sit to stand x 2 from elevated bed with use of steady standing frame.  Pt needed mod assist to support trunk to get to standing, but with knees blocked by stander could be min assist to maintaing standing x 2 until fatigue.  Verbal cues for upright posture, extended hips. Used steady standing frame to transfer OOB to chair.                          Balance Overall balance assessment: Needs assistance Sitting-balance support: Feet supported;Bilateral upper extremity supported;Single extremity supported Sitting balance-Leahy Scale: Fair     Standing balance support: Bilateral upper extremity supported Standing balance-Leahy Scale: Poor                      Cognition Arousal/Alertness: Awake/alert Behavior During Therapy: WFL for tasks assessed/performed Overall Cognitive Status: Within Functional Limits for tasks assessed                             Pertinent Vitals/Pain Pain Assessment: Faces Faces Pain Scale: Hurts even more Pain Location: right leg Pain Descriptors / Indicators: Burning Pain Intervention(s): Limited activity within patient's tolerance;Monitored during session;Repositioned;Patient requesting pain meds-RN notified;RN gave pain meds during session           PT Goals (current goals can now be found in the care plan section) Acute Rehab PT Goals Patient Stated Goal: Get my strength and independence back Progress towards PT goals: Progressing toward goals    Frequency  Min 3X/week    PT Plan Current plan remains appropriate       End of Session Equipment Utilized During Treatment: Gait belt Activity Tolerance: Patient limited  by fatigue Patient left: in chair;with call bell/phone within reach     Time: 1308-6578 PT Time Calculation (min) (ACUTE ONLY): 42 min  Charges:  $Therapeutic Activity: 38-52 mins                      Musab Wingard B. Tomica Arseneault, PT, DPT [phone]   07/14/2015, 11:07 PM

## 2015-07-15 ENCOUNTER — Inpatient Hospital Stay (HOSPITAL_COMMUNITY): Payer: PPO

## 2015-07-15 DIAGNOSIS — R531 Weakness: Secondary | ICD-10-CM | POA: Diagnosis not present

## 2015-07-15 DIAGNOSIS — G35 Multiple sclerosis: Secondary | ICD-10-CM | POA: Diagnosis not present

## 2015-07-15 DIAGNOSIS — R29898 Other symptoms and signs involving the musculoskeletal system: Secondary | ICD-10-CM | POA: Diagnosis not present

## 2015-07-15 DIAGNOSIS — G8220 Paraplegia, unspecified: Secondary | ICD-10-CM | POA: Diagnosis not present

## 2015-07-15 DIAGNOSIS — R32 Unspecified urinary incontinence: Secondary | ICD-10-CM | POA: Diagnosis not present

## 2015-07-15 DIAGNOSIS — M5124 Other intervertebral disc displacement, thoracic region: Secondary | ICD-10-CM | POA: Diagnosis not present

## 2015-07-15 DIAGNOSIS — M50223 Other cervical disc displacement at C6-C7 level: Secondary | ICD-10-CM | POA: Diagnosis not present

## 2015-07-15 DIAGNOSIS — I10 Essential (primary) hypertension: Secondary | ICD-10-CM | POA: Diagnosis not present

## 2015-07-15 LAB — BASIC METABOLIC PANEL
ANION GAP: 11 (ref 5–15)
BUN: 21 mg/dL — ABNORMAL HIGH (ref 6–20)
CALCIUM: 9.1 mg/dL (ref 8.9–10.3)
CO2: 28 mmol/L (ref 22–32)
Chloride: 103 mmol/L (ref 101–111)
Creatinine, Ser: 0.98 mg/dL (ref 0.44–1.00)
GFR calc Af Amer: >60 mL/min (ref >60)
GFR calc non Af Amer: >60 mL/min (ref >60)
GLUCOSE: 175 mg/dL — AB (ref 65–99)
POTASSIUM: 4.1 mmol/L (ref 3.5–5.1)
Sodium: 142 mmol/L (ref 135–145)

## 2015-07-15 MED ORDER — FUROSEMIDE 20 MG PO TABS
20.0000 mg | ORAL_TABLET | Freq: Every day | ORAL | Status: DC
  Administered 2015-07-16 – 2015-07-19 (×4): 20 mg via ORAL
  Filled 2015-07-15 (×4): qty 1

## 2015-07-15 NOTE — Progress Notes (Signed)
Subjective: Patient had no new complaints. Strength of lower extremities is improving.  Objective: Current vital signs: BP 136/76 mmHg  Pulse 68  Temp(Src) 98.6 F (37 C) (Oral)  Resp 18  Ht  (1.6 m)  Wt 118.389 kg (261 lb)  BMI 46.25 kg/m2  SpO2 95%  LMP 10/07/2010  Neurologic Exam: Patient was alert and in no acute distress. Mental status was normal. Extraocular movements were full and conjugate. Face was symmetrical. Speech was normal. Motor exam showed normal strength proximally and distally in upper extremities. She was able to raise lower extremities against gravity ability to left lower extremities about 1 and she'll meet side.  Medications: I have reviewed the patient's current medications.  Assessment/Plan: MRsI of cervical and thoracic spine are pending. Probable exacerbation of multiple sclerosis is etiology for patient's presenting complaints  Recommendations: 1. Continue IV Medrol 500 mg every 12 hours for a total of 6 doses. Continue physical therapy intervention, as well. Anticipate need for inpatient rehabilitation following this acute hospital stay.  C.R. Roseanne Reno, MD [facility] [phone]  07/15/2015  10:11 AM

## 2015-07-15 NOTE — Progress Notes (Signed)
Family Medicine Teaching Service Daily Progress Note Intern Pager: [phone]  Patient name: Barbara [name] Medical record number: [id number] Date of birth: 1955-06-01 Age: 60 y.o. Gender: female  Primary Care Provider: Sanjuana Letters, MD Consultants: Neurology Code Status: DNR  Pt Overview and Major Events to Date:  3/8: Admit for worsening LE weakness likely MS flare   Barbara [name] is a 60 y.o. female presenting with worsening LE weakness. PMH is significant for MS, HTN, spinal stenosis of lumbar region and cervical region, chronic venous stasis.   Worsening LE weakness in the setting of MS: Possible MS flare. Reasons for flare could be secondary to recent viral illness vs medication noncompliance vs advancing disease state.  MRI brain without new lesions. Clinically stable with some improvement of weakness.   - IV Solumedrol 500mg  BID for total 6 doses per Neurology - PO protonix  - consult neurology, appreciate reccs: MRI thoracic and cervical spine with contrast- will obtain today - Robaxin 1,500mg  TID  - Gabapentin 300mg  TID  - PT/OT consult: PT- CIR - CIR consulted: will likely accept for inpatient therapy, awaiting OT reccs - SW consult for financial assistance   HTN: Overall stable.  - holding Lisinopril due to elevated Cr and patient will get MRI w/wo contrast on admission  - Hydralazine PRN SBP >180 and/or DBP >110 - restart oral medications when possible  AKI, improving: 1.05 > 0.98 (baseline 0.7-0.9) - holding home Lisinopril - monitor with labs - avoid nephrotoxic meds  Urinary Incontinence: chronic issue which seems to be worsening per patient.  - will discontinue Oxybutynin  - UA without signs of infection   Leukocytosis: Mildly elevated at 11.3. Patient has been taking Prednisone at home which is most likely reason for elevation. No clinical signs of infection. Afebrile. Currently on IV Solumedrol - will monitor with labs  Chronic Venous  Stasis: No signs of infection. Takes Lasix daily for LE edema but has not been taking recently - will restart home Lasix 20mg    Chronic Back Pain:  - home Ultram 50mg  q 6 hrs  MS: Not on MS medication currently due to financial difficulties without insurance. Was on Copaxin in 2014. Followed by Dr. Marjory Lies, [facility] who recommended patient come in for further evaluation.  Hypokalemia, resolved: 3.4 > 3.7 s/p Kdur  - monitor with labs  FEN/GI: Heart diet, SLIV Prophylaxis: Lovenox Sub Q  Disposition: admit to inpatient service; discharge pending clinical improvement.   Subjective:  Doing well. No concerns  Objective: Temp:  [97.8 F (36.6 C)-98.6 F (37 C)] 98.6 F (37 C) (03/11 0620) Pulse Rate:  [61-84] 68 (03/11 0620) Resp:  [16-18] 18 (03/11 0620) BP: (136-163)/(73-97) 136/76 mmHg (03/11 0620) SpO2:  [94 %-98 %] 95 % (03/11 1443) Physical Exam: General: NAD, lying in bed, obese female Cardiovascular:  systolic murmur 2/6 heard equally in right and left upper sternal border Respiratory: normal effort CTAB Abdomen: soft, NT, ND MSK: LE- skin changes from chronic venous insufficiency, patch of erythema on left shin and calf and patch of erythema but milder on right shin and right calf, no increased warmth or tenderness to palpation. Pitting edema on feet bilaterally with trace pitting edema up to mid shin.  Extremities: CN 2-12 intact (did not formally evaluate visual fields), strength 5/5 in upper extremities bilaterally, sensation intact to light touch in upper extremities. LE: ~2/5 strength in lower extremities bilaterally (stable from yesterday)--hip flexors 2/5; knee extensors 3/5, plantar flexion 4/5; sensation to light touch decreased in both  LE compared to UE but worse in LLE compare to RLE.   Laboratory:  Recent Labs Lab 07/13/15 0129 07/13/15 0319 07/14/15 0400  WBC 13.4* 13.4* 11.3*  HGB 14.7 14.6 14.5  HCT 43.9 43.5 44.3  PLT 200 198 198     Recent Labs Lab 07/13/15 0319 07/14/15 0400 07/15/15 0704  NA 144 143 142  K 3.4* 3.7 4.1  CL 102 104 103  CO2 BUN 22* 20 21*  CREATININE 0.91 1.05* 0.98  CALCIUM 9.4 9.6 9.1  GLUCOSE 99 231* 175*   MRI Brain:  FINDINGS: Major intracranial vascular flow voids are stable. No restricted diffusion to suggest acute infarction. No midline shift, mass effect, evidence of mass lesion, ventriculomegaly, extra-axial collection or acute intracranial hemorrhage. Cervicomedullary junction and pituitary are within normal limits. Stable cerebral volume.  Mild periventricular white matter T2 and FLAIR hyperintensity is stable since 09-30-14, most pronounced along the body of the left lateral ventricle. No new cerebral white matter lesions. No restricted or enhancing brain lesions. Chronic nonspecific T2 hyperintensity in the globus pallidus (series 6, image 14) is stable. No definite brainstem or cerebellar signal abnormality. No chronic cerebral blood products.  Visualized cervical spine remarkable for chronic dorsal spinal cord demyelinating lesion at C2-C3 (series 2, image 111).  Visible internal auditory structures appear normal. Stable trace mastoid fluid. Asymmetric T2 hyperintensity of the right optic nerve appears stable (series 11, image 25) with otherwise negative orbit soft tissues. Bone marrow signal is stable and within normal limits. Soft tissues  IMPRESSION: 1. Stable MRI appearance of the brain since 03/05/2015. No new or active demyelinating brain lesion identified. 2. No new intracranial abnormality.  Palma Holter, MD 07/15/2015, 9:00 AM PGY-1, [facility] Family Medicine FPTS Intern pager: [phone], text pages welcome

## 2015-07-16 DIAGNOSIS — R531 Weakness: Secondary | ICD-10-CM | POA: Diagnosis not present

## 2015-07-16 DIAGNOSIS — G8220 Paraplegia, unspecified: Secondary | ICD-10-CM | POA: Diagnosis not present

## 2015-07-16 DIAGNOSIS — R32 Unspecified urinary incontinence: Secondary | ICD-10-CM | POA: Diagnosis not present

## 2015-07-16 DIAGNOSIS — I10 Essential (primary) hypertension: Secondary | ICD-10-CM | POA: Diagnosis not present

## 2015-07-16 DIAGNOSIS — R29898 Other symptoms and signs involving the musculoskeletal system: Secondary | ICD-10-CM | POA: Diagnosis not present

## 2015-07-16 DIAGNOSIS — G35 Multiple sclerosis: Secondary | ICD-10-CM | POA: Diagnosis not present

## 2015-07-16 LAB — BASIC METABOLIC PANEL
Anion gap: 14 (ref 5–15)
BUN: 23 mg/dL — AB (ref 6–20)
CO2: 27 mmol/L (ref 22–32)
CREATININE: 0.95 mg/dL (ref 0.44–1.00)
Calcium: 9.1 mg/dL (ref 8.9–10.3)
Chloride: 101 mmol/L (ref 101–111)
Glucose, Bld: 182 mg/dL — ABNORMAL HIGH (ref 65–99)
POTASSIUM: 4 mmol/L (ref 3.5–5.1)
SODIUM: 142 mmol/L (ref 135–145)

## 2015-07-16 MED ORDER — PREDNISONE 20 MG PO TABS
60.0000 mg | ORAL_TABLET | Freq: Once | ORAL | Status: AC
  Administered 2015-07-16: 60 mg via ORAL
  Filled 2015-07-16: qty 3

## 2015-07-16 MED ORDER — PREDNISONE 20 MG PO TABS: 60.0000 mg | ORAL_TABLET | Freq: Once | ORAL | Status: DC

## 2015-07-16 NOTE — Progress Notes (Signed)
Family Medicine Teaching Service Daily Progress Note Intern Pager: [phone]  Patient name: Barbara [name] Medical record number: [id number] Date of birth: [birth year] Age: 60 y.o. Gender: female  Primary Care Provider: Sanjuana Letters, MD Consultants: Neurology Code Status: DNR  Pt Overview and Major Events to Date:  3/8: Admit for worsening LE weakness likely MS flare   Barbara [name] is a 60 y.o. female presenting with worsening LE weakness. PMH is significant for MS, HTN, spinal stenosis of lumbar region and cervical region, chronic venous stasis.   Multiple sclerosis w/ current flare: Bilateral LE weakness as primary symptom. Reason for flare unknown (viral illness vs medication noncompliance vs advancing disease state). Patient not taking home medications secondary to cost. Clinically stable with some improvement of weakness. MRI brain without new lesions. MRI C/T Spine: Chronic c-spine hyperintensity (unchanged), t-spine lesion (unknown if new or old), T7-8 osteophyte w/ possible mass effect noted (difficult to assess due to test cut short from patient anxiety) - IV Solumedrol 500mg  BID for total 6 doses per Neurology >> completed 3/11 - PO protonix  - Neuro Consult - Robaxin 1,500mg  TID  - Gabapentin 300mg  TID  - PT/OT consult: PT- CIR - CIR consulted: will likely accept for inpatient therapy - SW consult for financial assistance   HTN: Overall stable.  - holding Lisinopril  - Hydralazine PRN SBP >180 and/or DBP >110 - restart oral medications as needed  AKI, improving: 1.05 > 0.98 > 0.95 (baseline 0.7-0.9) - holding home Lisinopril - monitor with labs - avoid nephrotoxic meds  Urinary Incontinence: chronic issue which seems to be worsening per patient.  - will discontinue Oxybutynin  - UA without signs of infection   Leukocytosis: Mildly elevated at 11.3. Patient has been taking Prednisone at home which is most likely reason for elevation. No clinical  signs of infection. Afebrile. - will monitor with labs  Chronic Venous Stasis: No signs of infection. Takes Lasix daily for LE edema but has not been taking recently - will restart home Lasix 20mg    Chronic Back Pain:  - home Ultram 50mg  q 6 hrs  Hypokalemia, resolved: 3.4 > 3.7 s/p Kdur  - monitor with labs  FEN/GI: Heart diet, SLIV Prophylaxis: Lovenox Sub Q  Disposition:  discharge to CIR  Subjective:  Doing well. No concerns  Objective: Temp:  [98.6 F (37 C)-98.8 F (37.1 C)] 98.7 F (37.1 C) (03/12 1432) Pulse Rate:  [67-74] 67 (03/12 1432) Resp:  [16-19] 16 (03/12 1432) BP: (138-156)/(73-88) 138/88 mmHg (03/12 1432) SpO2:  [92 %-97 %] 95 % (03/12 1432) Physical Exam: General: NAD, lying in bed, obese female Cardiovascular:  systolic murmur 2/6 heard equally in right and left upper sternal border Respiratory: normal effort CTAB Abdomen: soft, NT, ND MSK: LE- skin changes from chronic venous insufficiency. Some pitting edema (+1-2) noted bilat.  Extremities: CN 2-12, strength 5/5 in upper extremities bilaterally, sensation intact to light touch in upper extremities. LE: ~2/5 strength in lower extremities bilaterally--hip flexors 2/5; knee extensors 3/5, plantar flexion 4/5; sensation to light touch decreased in both LE compared to UE but worse in LLE compare to RLE.   Laboratory:  Recent Labs Lab 07/13/15 0129 07/13/15 0319 07/14/15 0400  WBC 13.4* 13.4* 11.3*  HGB 14.7 14.6 14.5  HCT 43.9 43.5 44.3  PLT 200 198 198    Recent Labs Lab 07/14/15 0400 07/15/15 0704 07/16/15 0555  NA 143 142 142  K 3.7 4.1 4.0  CL 104 103 101  CO2 29 28  27  BUN 20 21* 23*  CREATININE 1.05* 0.98 0.95  CALCIUM 9.6 9.1 9.1  GLUCOSE 231* 175* 182*   MRI Brain:  IMPRESSION: 1. Stable MRI appearance of the brain since 03/05/2015. No new or active demyelinating brain lesion identified. 2. No new intracranial abnormality.  MRI C/T Spine; 3/11 IMPRESSION: 1. Chronic T2  hyperintensity in the cervical cord, unchanged from 2012. No evidence of progressive demyelination in the cervical cord. 2. Similar mild T2 hyperintensity within the mid thoracic cord. No prior imaging of the thoracic spine available. 3. Interval lower cervical corpectomy and fusion with residual spondylosis as described. No cord compression or progressive foraminal narrowing in the cervical spine. 4. Limited imaging of the thoracic spine in the sagittal plane only demonstrates a circumferential disc osteophyte complex at T7-8, contributing to mild mass effect on the thoracic cord.   Kathee Delton, MD 07/16/2015, 3:29 PM PGY-2, [place] [facility] FPTS Intern pager: [phone], text pages welcome

## 2015-07-17 DIAGNOSIS — I10 Essential (primary) hypertension: Secondary | ICD-10-CM | POA: Diagnosis not present

## 2015-07-17 DIAGNOSIS — R32 Unspecified urinary incontinence: Secondary | ICD-10-CM | POA: Diagnosis not present

## 2015-07-17 DIAGNOSIS — R29898 Other symptoms and signs involving the musculoskeletal system: Secondary | ICD-10-CM | POA: Diagnosis not present

## 2015-07-17 DIAGNOSIS — R531 Weakness: Secondary | ICD-10-CM | POA: Diagnosis not present

## 2015-07-17 DIAGNOSIS — G8220 Paraplegia, unspecified: Secondary | ICD-10-CM | POA: Diagnosis not present

## 2015-07-17 DIAGNOSIS — G35 Multiple sclerosis: Secondary | ICD-10-CM | POA: Diagnosis not present

## 2015-07-17 MED ORDER — LISINOPRIL 20 MG PO TABS
20.0000 mg | ORAL_TABLET | Freq: Every day | ORAL | Status: DC
  Administered 2015-07-17: 20 mg via ORAL
  Filled 2015-07-17: qty 1

## 2015-07-17 MED ORDER — PREDNISONE 50 MG PO TABS
50.0000 mg | ORAL_TABLET | Freq: Once | ORAL | Status: AC
  Administered 2015-07-17: 50 mg via ORAL
  Filled 2015-07-17: qty 1

## 2015-07-17 NOTE — Progress Notes (Signed)
Rehab admissions - I have called and opened the case with [facility] [employer] insurance carrier.  I met with patient and she is hopeful for inpatient rehab admission.  I will update all once I hear back from insurance case manager.  Call me for questions.  #[phone]

## 2015-07-17 NOTE — Progress Notes (Signed)
Physical Therapy Treatment Patient Details Name: Barbara [name] MRN: [id number] DOB: [birth year] Today's Date: 07/17/2015    History of Present Illness Barbara [name] is a 61 y.o. female presenting with worsening LE weakness.  Dx with MS flare. PMH is significant for MS, HTN, spinal stenosis of lumbar region and cervical region, chronic venous stasis    PT Comments    Patient is progressing toward mobility goals and is eager to participate in therapy. Continue to progress as tolerated.   Follow Up Recommendations  CIR     Equipment Recommendations  None recommended by PT    Recommendations for Other Services       Precautions / Restrictions Precautions Precautions: Fall    Mobility  Bed Mobility Overal bed mobility: Needs Assistance Bed Mobility: Supine to Sit     Supine to sit: Min guard;HOB elevated     General bed mobility comments: cues for technique; heavy use of bedrails and HOB elevated; min guard for safety and increased time needed  Transfers Overall transfer level: Needs assistance   Transfers: Sit to/from Stand Sit to Stand: Min guard Stand pivot transfers:  (used steady assisted standing frame)       General transfer comment: sit to stand X3 from EOB; min guard for safety; cues to not hold breath, upright posture, and weight shifting  Ambulation/Gait                 Stairs            Wheelchair Mobility    Modified Rankin (Stroke Patients Only)       Balance Overall balance assessment: Needs assistance Sitting-balance support: Single extremity supported;Feet supported Sitting balance-Leahy Scale: Fair     Standing balance support: Bilateral upper extremity supported Standing balance-Leahy Scale: Poor                      Cognition Arousal/Alertness: Awake/alert Behavior During Therapy: WFL for tasks assessed/performed Overall Cognitive Status: Within Functional Limits for tasks assessed                       Exercises      General Comments        Pertinent Vitals/Pain Pain Assessment: Faces Faces Pain Scale: Hurts whole lot Pain Location: R hip with inital WS in standing Pain Descriptors / Indicators: Aching;Grimacing;Moaning;Sharp;Sore Pain Intervention(s): Limited activity within patient's tolerance;Repositioned;Monitored during session    Home Living                      Prior Function            PT Goals (current goals can now be found in the care plan section) Acute Rehab PT Goals Patient Stated Goal: Get my strength and independence back Progress towards PT goals: Progressing toward goals    Frequency  Min 3X/week    PT Plan Current plan remains appropriate    Co-evaluation             End of Session Equipment Utilized During Treatment: Gait belt Activity Tolerance: Patient tolerated treatment well Patient left: in chair;with call bell/phone within reach;with nursing/sitter in room     Time: 1546-1619 PT Time Calculation (min) (ACUTE ONLY): 33 min  Charges:  $Therapeutic Activity: 23-37 mins                    G Codes:      Derek Mound, PTA  Pager: [phone]   07/17/2015, 4:41 PM

## 2015-07-17 NOTE — Progress Notes (Signed)
Patient stated she feels light headed and nauseous. VSS, see vitals flow sheet, zofran administered, patient put back in bed using 2 max assist with stedy. Patient states she already feels better back in bed, will continue to monitor patient. Patient's sister, carolyn, is now at bedside.

## 2015-07-17 NOTE — Progress Notes (Signed)
Family Medicine Teaching Service Daily Progress Note Intern Pager: [phone]  Patient name: Barbara [name] Medical record number: [id number] Date of birth: [birth year] Age: 60 y.o. Gender: female  Primary Care Provider: Sanjuana Letters, MD Consultants: Neurology Code Status: DNR  Pt Overview and Major Events to Date:  3/8: Admit for worsening LE weakness likely MS flare 3/12: started Prednisone taper per neurology  (after IV Solumedrol)   Barbara [name] is a 60 y.o. female presenting with worsening LE weakness. PMH is significant for MS, HTN, spinal stenosis of lumbar region and cervical region, chronic venous stasis.   Multiple sclerosis w/ current flare: Bilateral LE weakness as primary symptom. Reason for flare unknown (viral illness vs medication noncompliance vs advancing disease state). Patient not taking home medications secondary to cost. Clinically stable with some improvement of weakness. MRI brain without new lesions. MRI C/T Spine: Chronic c-spine hyperintensity (unchanged), t-spine lesion (unknown if new or old), T7-8 osteophyte w/ possible mass effect noted (difficult to assess due to test cut short from patient anxiety) - IV Solumedrol  BID for total 6 doses per Neurology >> completed 3/11 - per neurology will Prednisone taper staring with  daily (3/12), then down  increments daily - PO protonix  - Neuro Consult - Robaxin 1,500mg  TID  - Gabapentin  TID  - PT/OT consult: PT- CIR - CIR consulted: will likely accept for inpatient therapy - SW consult for financial assistance   HTN: Elevated - Will start home Lisinopril  - Hydralazine PRN SBP >180 and/or DBP >110  AKI, improving: 1.05 > 0.98 > 0.95 (baseline 0.7-0.9) - monitor with labs - avoid nephrotoxic meds  Urinary Incontinence: chronic issue which seems to be worsening per patient.  - will discontinue Oxybutynin  - UA without signs of infection   Leukocytosis: Mildly elevated at  11.3. Patient has been taking Prednisone at home which is most likely reason for elevation. No clinical signs of infection. Afebrile. - will monitor with labs  Chronic Venous Stasis: No signs of infection. Takes Lasix daily for LE edema but has not been taking recently - will restart home Lasix  daily  Chronic Back Pain:  - home Ultram  q 6 hrs  Hypokalemia, resolved:  - monitor with labs  FEN/GI: Heart diet, SLIV Prophylaxis: Lovenox Sub Q  Disposition:  discharge to CIR  Subjective:  Doing well. No concerns  Objective: Temp:  [97.3 F (36.3 C)-98.7 F (37.1 C)] 97.3 F (36.3 C) (03/13 0622) Pulse Rate:  [54-67] 54 (03/13 0622) Resp:  [16-18] 16 (03/13 0622) BP: (134-178)/(68-102) 178/88 mmHg (03/13 0622) SpO2:  [94 %-98 %] 95 % (03/13 0622) Physical Exam: General: NAD, lying in bed, obese female Cardiovascular:  systolic murmur 2/6 heard equally in right and left upper sternal border Respiratory: normal effort CTAB Abdomen: soft, NT, ND MSK: LE- skin changes from chronic venous insufficiency. Some pitting edema (+1-2) noted bilat.  Extremities: CN 2-12, strength 5/5 in upper extremities bilaterally, sensation intact to light touch in upper extremities. LE: hip flexors 2/5; knee extensors 3/5, plantar flexion 3-4/5; sensation to light touch decreased in both LE compared to UE but worse in LLE compare to RLE.   Laboratory:  Recent Labs Lab 07/13/15 0129 07/13/15 0319 07/14/15 0400  WBC 13.4* 13.4* 11.3*  HGB 14.7 14.6 14.5  HCT 43.9 43.5 44.3  PLT 200 198 198    Recent Labs Lab 07/14/15 0400 07/15/15 0704 07/16/15 0555  NA 143 142 142  K 3.7 4.1 4.0  CL 104  103 101  CO2 29 28 27   BUN 20 21* 23*  CREATININE 1.05* 0.98 0.95  CALCIUM 9.6 9.1 9.1  GLUCOSE 231* 175* 182*   MRI Brain:  IMPRESSION: 1. Stable MRI appearance of the brain since 03/05/2015. No new or active demyelinating brain lesion identified. 2. No new intracranial  abnormality.  MRI C/T Spine; 3/11 IMPRESSION: 1. Chronic T2 hyperintensity in the cervical cord, unchanged from 2012. No evidence of progressive demyelination in the cervical cord. 2. Similar mild T2 hyperintensity within the mid thoracic cord. No prior imaging of the thoracic spine available. 3. Interval lower cervical corpectomy and fusion with residual spondylosis as described. No cord compression or progressive foraminal narrowing in the cervical spine. 4. Limited imaging of the thoracic spine in the sagittal plane only demonstrates a circumferential disc osteophyte complex at T7-8, contributing to mild mass effect on the thoracic cord.   Palma Holter, MD 07/17/2015, 8:24 AM PGY-1,  Family Medicine FPTS Intern pager: [phone], text pages welcome

## 2015-07-17 NOTE — Care Management Important Message (Signed)
Important Message  Patient Details  Name: Barbara [name] MRN: [id number] Date of Birth: Jan 25, 1956   Medicare Important Message Given:  Yes    Joshva Labreck P Keiera Strathman 07/17/2015, 12:20 PM

## 2015-07-17 NOTE — Progress Notes (Addendum)
Occupational Therapy Treatment Patient Details Name: Barbara [name] MRN: [id number] DOB: 10-12-1955 Today's Date: 07/17/2015    History of present illness Barbara [name] is a 60 y.o. female presenting with worsening LE weakness.  Dx with MS flare. PMH is significant for MS, HTN, spinal stenosis of lumbar region and cervical region, chronic venous stasis   OT comments  Pt progressing. Education provided in session. Continue to recommend CIR and feel pt is a good candidate.  Follow Up Recommendations  CIR;Supervision - Intermittent    Equipment Recommendations  Other (comment) (defer to next venue)    Recommendations for Other Services Rehab consult    Precautions / Restrictions Precautions Precautions: Fall Precaution Comments:  Restrictions Weight Bearing Restrictions: No       Mobility Bed Mobility   General bed mobility comments: not assessed  Transfers Overall transfer level: Needs assistance Equipment used:  (stedy assist frame) Transfers: Sit to/from Stand Sit to Stand: +2 physical assistance;Max assist (pt did better standing from seated position on stedy requiring less assist)        General transfer comment: assist to boost from recliner chair.     Balance Used stedy assist standing frame.                ADL Overall ADL's : Needs assistance/impaired   Eating/Feeding Details (indicate cue type and reason): took meds at sink and nurse suggested pt tuck chin as she appeared to have difficulty swallowing pills. Grooming: Brushing hair;Oral care;Min guard;Standing;Sitting Grooming Details (indicate cue type and reason): used stedy assist frame at sink; pt stood some at sink while in stedy frame.                  Toilet Transfer: +2 for physical assistance;Maximal assistance (sit to stand from recliner chair with stedy assist frame; less assist required to stand from seated position on stedy)           Functional mobility during ADLs: +2 for  physical assistance;Maximal assistance (for sit to stand from chair with stedy assist frame) General ADL Comments: Talked with pt about LB ADLs and AE. Suggested using elastic shoe laces and also discussed idea for washing her hair.       Vision                     Perception     Praxis      Cognition  Awake/Alert Behavior During Therapy: WFL for tasks assessed/performed Overall Cognitive Status: Within Functional Limits for tasks assessed                       Extremity/Trunk Assessment               Exercises     Shoulder Instructions       General Comments      Pertinent Vitals/ Pain       Pain Assessment: 0-10 Pain Score:  (3-4) Pain Location: from her back to her feet Pain Descriptors / Indicators: Constant;Aching Pain Intervention(s): Monitored during session;Limited activity within patient's tolerance  Home Living                                          Prior Functioning/Environment              Frequency Min 2X/week     Progress Toward Goals  OT  Goals(current goals can now be found in the care plan section)  Progress towards OT goals: Progressing toward goals  Acute Rehab OT Goals Patient Stated Goal: go home and wash her hair OT Goal Formulation: With patient Time For Goal Achievement: 07/28/15 Potential to Achieve Goals: Good ADL Goals Pt Will Perform Upper Body Bathing: with modified independence;sitting Pt Will Perform Lower Body Bathing: with supervision;sitting/lateral leans;sit to/from stand Pt Will Transfer to Toilet: with min guard assist;stand pivot transfer;bedside commode Pt Will Perform Toileting - Clothing Manipulation and hygiene: with supervision;sitting/lateral leans Pt/caregiver will Perform Home Exercise Program: Increased strength;Both right and left upper extremity;With theraband;Independently;With written HEP provided  Plan Discharge plan needs to be updated    Co-evaluation                  End of Session Equipment Utilized During Treatment: Gait belt;Other (comment) (stedy)   Activity Tolerance Patient limited by pain   Patient Left in chair;with call bell/phone within reach   Nurse Communication Other (comment) (assisted with sit to stand transfer)        Time: 1610-9604 OT Time Calculation (min): 22 min  Charges: OT General Charges $OT Visit: 1 Procedure OT Treatments $Self Care/Home Management : 8-22 mins  Earlie Raveling OTR/L [phone] 07/17/2015, 5:12 PM

## 2015-07-18 DIAGNOSIS — G8220 Paraplegia, unspecified: Secondary | ICD-10-CM | POA: Diagnosis not present

## 2015-07-18 DIAGNOSIS — R531 Weakness: Secondary | ICD-10-CM | POA: Diagnosis not present

## 2015-07-18 DIAGNOSIS — G35 Multiple sclerosis: Secondary | ICD-10-CM | POA: Diagnosis not present

## 2015-07-18 DIAGNOSIS — R32 Unspecified urinary incontinence: Secondary | ICD-10-CM | POA: Diagnosis not present

## 2015-07-18 DIAGNOSIS — R29898 Other symptoms and signs involving the musculoskeletal system: Secondary | ICD-10-CM | POA: Diagnosis not present

## 2015-07-18 DIAGNOSIS — I10 Essential (primary) hypertension: Secondary | ICD-10-CM | POA: Diagnosis not present

## 2015-07-18 MED ORDER — OXYBUTYNIN CHLORIDE 5 MG PO TABS
5.0000 mg | ORAL_TABLET | Freq: Every day | ORAL | Status: DC
Start: 2015-07-18 — End: 2015-07-19
  Administered 2015-07-18: 5 mg via ORAL
  Filled 2015-07-18: qty 1

## 2015-07-18 MED ORDER — LISINOPRIL 10 MG PO TABS
10.0000 mg | ORAL_TABLET | Freq: Every day | ORAL | Status: DC
  Administered 2015-07-18 – 2015-07-19 (×2): 10 mg via ORAL
  Filled 2015-07-18 (×2): qty 1

## 2015-07-18 MED ORDER — PREDNISONE 20 MG PO TABS
40.0000 mg | ORAL_TABLET | Freq: Once | ORAL | Status: AC
  Administered 2015-07-18: 40 mg via ORAL
  Filled 2015-07-18: qty 2

## 2015-07-18 NOTE — Progress Notes (Signed)
Rehab admissions - Awaiting decision from insurance carrier regarding possible inpatient rehab admission.  I hope to hear back from insurance case manager today.  I do have rehab beds available should I get authorization.  I will update all once I hear back from insurance carrier.  Call me for questions.  #[phone]

## 2015-07-18 NOTE — Care Management Note (Signed)
Case Management Note  Patient Details  Name: Barbara [name] MRN: [id number] Date of Birth: 11-03-1955  Subjective/Objective:                    Action/Plan: Plan is for d/c to CIR. CM continuing to follow for further d/c needs.   Expected Discharge Date:                  Expected Discharge Plan:     In-House Referral:     Discharge planning Services     Post Acute Care Choice:    Choice offered to:     DME Arranged:    DME Agency:     HH Arranged:    HH Agency:     Status of Service:  In process, will continue to follow  Medicare Important Message Given:  Yes Date Medicare IM Given:    Medicare IM give by:    Date Additional Medicare IM Given:    Additional Medicare Important Message give by:     If discussed at Long Length of Stay Meetings, dates discussed:    Additional Comments:  Kermit Balo, RN 07/18/2015, 3:18 PM

## 2015-07-18 NOTE — Progress Notes (Signed)
Family Medicine Teaching Service Daily Progress Note Intern Pager: [phone]  Patient name: Barbara [name] Medical record number: [id number] Date of birth: 07/07/1955 Age: 60 y.o. Gender: female  Primary Care Provider: Sanjuana Letters, MD Consultants: Neurology Code Status: FULL  Pt Overview and Major Events to Date:  3/8: Admit for worsening LE weakness likely MS flare 3/12: started Prednisone taper per neurology  (after IV Solumedrol)   Barbara [name] is a 60 y.o. female presenting with worsening LE weakness. PMH is significant for MS, HTN, spinal stenosis of lumbar region and cervical region, chronic venous stasis.   Multiple sclerosis w/ current flare: Bilateral LE weakness as primary symptom. Reason for flare unknown (viral illness vs medication noncompliance vs advancing disease state). Patient not taking home medications secondary to cost. Clinically stable with some improvement of weakness. MRI brain without new lesions. MRI C/T Spine: Chronic c-spine hyperintensity (unchanged), t-spine lesion (unknown if new or old), T7-8 osteophyte w/ possible mass effect noted (difficult to assess due to test cut short from patient anxiety) - IV Solumedrol  BID for total 6 doses per Neurology >> completed 3/11 - per neurology will Prednisone taper staring with  daily (3/12), then down  increments daily:  today  - PO protonix  - Neuro Consult - Robaxin 1,500mg  TID  - Gabapentin  TID  - PT/OT consult: PT- CIR - CIR consulted: will accept for inpatient therapy; waiting to hear back from insurance - SW consult for financial assistance   Bradycardia: noted overnight. Had intermittent recordings prior to this. Patient not symptomatic  HTN: stable  - Home Lisinopril  daily  - Hydralazine PRN SBP >180 and/or DBP >110  AKI, stable:  (baseline 0.7-0.9) - monitor with labs - avoid nephrotoxic meds  Urinary Incontinence: chronic issue which seems to be  worsening per patient.  - discontinued Oxybutynin  - UA without signs of infection   Leukocytosis: Mildly elevated at 11.3. Patient has been taking Prednisone at home which is most likely reason for elevation. No clinical signs of infection. Afebrile.  Chronic Venous Stasis: No signs of infection. Takes Lasix daily for LE edema but has not been taking recently -  home Lasix  daily  Chronic Back Pain:  - home Ultram  q 6 hrs  Hypokalemia, resolved:   FEN/GI: Heart diet, SLIV Prophylaxis: Lovenox Sub Q  Disposition:  discharge to CIR  Subjective:  Notes of one episode of lightheadedness and nausea which was resolved with zofran. No chest pain or shortness of breath.   Regarding MS medication, patient states her neurologist is working on obtaining a Engineer, manufacturing for [employer].   Objective: Temp:  [97.7 F (36.5 C)-98.6 F (37 C)] 98.2 F (36.8 C) (03/14 0458) Pulse Rate:  [56-66] 57 (03/14 0458) Resp:  [16-19] 18 (03/14 0458) BP: (90-145)/(60-93) 137/93 mmHg (03/14 0458) SpO2:  [93 %-99 %] 93 % (03/14 0458) Physical Exam: General: NAD, lying in bed, obese female Cardiovascular:  systolic murmur 2/6 heard equally in right and left upper sternal border Respiratory: normal effort CTAB Abdomen: soft, NT, ND MSK: LE- skin changes from chronic venous insufficiency. Some pitting edema (+1-2) noted bilat.  Extremities: CN 2-12, strength 5/5 in upper extremities bilaterally, sensation intact to light touch in upper extremities. LE: hip flexors 2/5; knee extensors 3/5, plantar flexion 3-4/5; sensation to light touch decreased in both LE compared to UE but worse in LLE compare to RLE.   Laboratory:  Recent Labs Lab 07/13/15 0129 07/13/15 0319 07/14/15 0400  WBC 13.4* 13.4*  11.3*  HGB 14.7 14.6 14.5  HCT 43.9 43.5 44.3  PLT 200 198 198    Recent Labs Lab 07/14/15 0400 07/15/15 0704 07/16/15 0555  NA 143 142 142  K 3.7 4.1 4.0  CL 104 103 101  CO2 29 28 27   BUN 20 21*  23*  CREATININE 1.05* 0.98 0.95  CALCIUM 9.6 9.1 9.1  GLUCOSE 231* 175* 182*   MRI Brain:  IMPRESSION: 1. Stable MRI appearance of the brain since 03/05/2015. No new or active demyelinating brain lesion identified. 2. No new intracranial abnormality.  MRI C/T Spine; 3/11 IMPRESSION: 1. Chronic T2 hyperintensity in the cervical cord, unchanged from 2012. No evidence of progressive demyelination in the cervical cord. 2. Similar mild T2 hyperintensity within the mid thoracic cord. No prior imaging of the thoracic spine available. 3. Interval lower cervical corpectomy and fusion with residual spondylosis as described. No cord compression or progressive foraminal narrowing in the cervical spine. 4. Limited imaging of the thoracic spine in the sagittal plane only demonstrates a circumferential disc osteophyte complex at T7-8, contributing to mild mass effect on the thoracic cord.   Palma Holter, MD 07/18/2015, 7:10 AM PGY-1, [facility] FPTS Intern pager: [phone], text pages welcome

## 2015-07-19 LAB — CREATININE, SERUM
Creatinine, Ser: 0.98 mg/dL (ref 0.44–1.00)
GFR calc Af Amer: >60 mL/min (ref >60)
GFR calc non Af Amer: >60 mL/min (ref >60)

## 2015-07-19 MED ORDER — PREDNISONE 10 MG PO TABS: ORAL_TABLET | ORAL | Status: DC

## 2015-07-19 MED ORDER — PREDNISONE 5 MG PO TABS: 30.0000 mg | ORAL_TABLET | Freq: Once | ORAL | Status: DC

## 2015-07-19 MED ORDER — PANTOPRAZOLE SODIUM 20 MG PO TBEC: 20.0000 mg | DELAYED_RELEASE_TABLET | Freq: Every day | ORAL | Status: DC

## 2015-07-19 MED FILL — predniSONE 10 MG TABS: 10 | 3 days supply | Qty: 6 | Fill #0

## 2015-07-19 MED FILL — PANTOPRAZOLE SOD DR 20 MG T: 20 | 30 days supply | Qty: 30 | Fill #0

## 2015-07-19 NOTE — Care Management Note (Signed)
Case Management Note  Patient Details  Name: Meghaan Venditti MRN: [id number] Date of Birth: 03-10-1956  Subjective/Objective:                    Action/Plan: Patient denied for CIR per her insurance. CM spoke with Ms Alderfer and went over the choices of SNF rehab vs home with [facility]. She wants to go home with [facility] services and states she has a lot of support at home. CM provided her the [facility] agencies that are available with [employer] and she selected [facility]. We also discussed any DME she may need at home and she states she has all the equipment she would need. CM spoke with Darl Pikes at [facility] and she accepted the referral. Patient states she has transportation home. MD updated and will update the bedside RN.   Expected Discharge Date:                  Expected Discharge Plan:  Home w Home Health Services  In-House Referral:     Discharge planning Services  CM Consult  Post Acute Care Choice:  Home Health Choice offered to:  Patient  DME Arranged:    DME Agency:     HH Arranged:  OT, PT, Nurse's Aide HH Agency:  [facility]  Status of Service:  Completed, signed off  Medicare Important Message Given:  Yes Date Medicare IM Given:    Medicare IM give by:    Date Additional Medicare IM Given:    Additional Medicare Important Message give by:     If discussed at Long Length of Stay Meetings, dates discussed:    Additional Comments:  Kermit Balo, RN 07/19/2015, 10:48 AM

## 2015-07-19 NOTE — Progress Notes (Addendum)
Rehab admissions - I have received a denial for acute inpatient rehab admission.  I have made case manager aware of denial.  Call me for questions.  #[phone]  I met with patient this am.  She would like to discharge home with [facility] therapies.  I have let the case manager know of patient wishes.  #[phone]

## 2015-07-19 NOTE — Progress Notes (Signed)
Received discharge instructions. RN discussed discharge instructions with patient including medications, follow up appointment, self care at home for incontinence. Patient verbalized understanding. Case mgmt states home health has been set up, patient denies equipment needs, states she has all at home. Patient's neuro assessment unchanged, denies pain, states she is ready to go home. Patient eating, RN and NT to help patient get dressed when done eating. Patient states her aunt is in hospital, and that she will be going to see aunt after discharge.

## 2015-07-19 NOTE — Discharge Summary (Signed)
Family Medicine Teaching [facility] Discharge Summary  Patient name: Barbara [name] Medical record number: [id number] Date of birth: Oct 28, 1955 Age: 60 y.o. Gender: female Date of Admission: 07/12/2015  Date of Discharge: 07/19/15 Admitting Physician: Latrelle Dodrill, MD  Primary Care Provider: Sanjuana Letters, MD Consultants: Neurology  Indication for Hospitalization: Lower extremity weakness  Discharge Diagnoses/Problem List:  MS Spastic [place]legai  Chronic cervical and lumbar spinal stenosis HTN  Disposition:  Insurance declined coverage for CIR; patient opted home with home health services over SNF  Discharge Condition: somewhat improved, stable   Discharge Exam: Please refer to progress note from day of discharge  Brief Hospital Course:  Ms. Mehl presented with worsening lower extremities with left worse than right. Patient reported that at baseline patient was able to transfer from wheelchair to bed or toilet, but was unable to do this over the last several days prior to admission. Reported chronic intermittent bladder incontinence, but was unable to tell if it's been worse since lower extremity weakness started, as she had been restricting oral intake of solids and liquids so as to not have to go to the bathroom while she had  been confined to bed. No fevers. No bowel incontinence.  Exam is significant for marked weakness of bilateral legs. Was able to move toes and could barely lift legs off bed against gravity. Sensation to light touch over legs was diminished, but present.  Symptoms were thought to be an exacerbation of MS. Reason for flare is unknown. No clinical signs of infection; patient did have leukocytosis but she was taking prednisone at home. Patient reported that she is not on DMAs as she has not been able to afford it with her insurance; she is working with her neurologist to obtain a Engineer, manufacturing for [employer]. MRI brain on admission did not show acute  demyelinating changes. Neurology was consulted and patient was started on IV solumedrol, then was started on PO Prednisone taper (started at  daily with  decrease daily). MRI of cervical and thoracic spine were obtained; Mild T2 hypersensitivity  Patient's weakness improved slightly. CIR accepted patient, however her insurance declined coverage. Therefore, patient chose to go home with Home Health services (PT, OT, aid).    Patient's chronic pain medications were continued during hospitalization.   Issues for Follow Up:  - ensure patient has close follow up with neurologist  Significant Procedures: none  Significant Labs and Imaging:   Recent Labs Lab 07/14/15 0400  WBC 11.3*  HGB 14.5  HCT 44.3  PLT 198    Recent Labs Lab 07/14/15 0400 07/15/15 0704 07/16/15 0555 07/19/15 0518  NA 143 142 142  --   K 3.7 4.1 4.0  --   CL 104 103 101  --   CO2 --   GLUCOSE 231* 175* 182*  --   BUN 20 21* 23*  --   CREATININE 1.05* 0.98 0.95 0.98  CALCIUM 9.6 9.1 9.1  --     MRI Brain: 07/13/15: FINDINGS: Major intracranial vascular flow voids are stable. No restricted diffusion to suggest acute infarction. No midline shift, mass effect, evidence of mass lesion, ventriculomegaly, extra-axial collection or acute intracranial hemorrhage. Cervicomedullary junction and pituitary are within normal limits. Stable cerebral volume.  Mild periventricular white matter T2 and FLAIR hyperintensity is stable since 09/28/14, most pronounced along the body of the left lateral ventricle. No new cerebral white matter lesions. No restricted or enhancing brain lesions. Chronic nonspecific T2 hyperintensity in the globus pallidus (  series 6, image 14) is stable. No definite brainstem or cerebellar signal abnormality. No chronic cerebral blood products.  Visualized cervical spine remarkable for chronic dorsal spinal cord demyelinating lesion at C2-C3 (series 2, image 111).  Visible  internal auditory structures appear normal. Stable trace mastoid fluid. Asymmetric T2 hyperintensity of the right optic nerve appears stable (series 11, image 25) with otherwise negative orbit soft tissues. Bone marrow signal is stable and within normal limits. Soft tissues  IMPRESSION: 1. Stable MRI appearance of the brain since 03/05/2015. No new or active demyelinating brain lesion identified. 2. No new intracranial abnormality.  MRI Cervical and Thoracic Spine: 07/15/15 FINDINGS: MRI CERVICAL SPINE FINDINGS  Alignment: Interval C6 and C7 partial corpectomy with anterior fusion from C5 through T1. Mild straightening without focal angulation or listhesis.  Bones: No acute or suspicious osseous findings.  Cord: Scattered areas of T2 hyperintensity within the cord at are stable, most prominent posteriorly on the right at C2-3. No cord expansion or hemorrhage identified.  Posterior Fossa: Visualized portions unremarkable.  Vertebral Arteries: Bilateral vertebral artery flow voids.  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  Disc levels:  C2-3: Stable asymmetric facet hypertrophy on the left. No significant spinal stenosis or nerve root encroachment.  C3-4: Stable mild disc bulging, uncinate spurring and facet hypertrophy contributing to mild left foraminal narrowing. No cord deformity.  C4-5: Stable spondylosis with posterior osteophytes, uncinate spurring and facet hypertrophy contributing to mild to moderate foraminal narrowing bilaterally. No cord deformity.  C5-6: Residual posterior osteophytes on the right contribute to mild mass effect on the thecal sac and mild right foraminal narrowing. No cord deformity.  C6-7: The spinal canal and neural foramina are well-decompressed status post partial corpectomy and fusion.  C7-T1: Disc bulging with residual asymmetric left foraminal disc osteophyte complex,  contributing to mild left foraminal narrowing. No cord deformity.  MRI THORACIC SPINE FINDINGS  There is no prior imaging of the thoracic spine. This portion of the study is limited, consisting of sagittal images only. No axial images were obtained.  There is a mild scoliosis with near anatomic lateral alignment. Scattered small hemangiomas are noted. There are no worrisome or acute osseous findings.  The conus medullaris extends to the upper L2 level. There is mild T2 hyperintensity within the cord at the T6 and T7 levels. No cord expansion or abnormal T1 signal seen.  There is prominent disc degeneration with annular disc bulging, anterior and posterior osteophytes at T7-8. There is some mass effect on the cord which is asymmetric to the left. The left foramen appears mildly narrowed medially based on sagittal images.  No other significant disc space findings demonstrated on sagittal imaging.  IMPRESSION: 1. Chronic T2 hyperintensity in the cervical cord, unchanged from 2012. No evidence of progressive demyelination in the cervical cord. 2. Similar mild T2 hyperintensity within the mid thoracic cord. No prior imaging of the thoracic spine available. 3. Interval lower cervical corpectomy and fusion with residual spondylosis as described. No cord compression or progressive foraminal narrowing in the cervical spine. 4. Limited imaging of the thoracic spine in the sagittal plane only demonstrates a circumferential disc osteophyte complex at T7-8, contributing to mild mass effect on the thoracic cord.  Results/Tests Pending at Time of Discharge: none  Discharge Medications:    Medication List    STOP taking these medications        doxycycline 100 MG tablet  Commonly known as:  VIBRA-TABS  TAKE these medications        albuterol 108 (90 Base) MCG/ACT inhaler  Commonly known as:  PROVENTIL HFA;VENTOLIN HFA  Inhale 2 puffs into the lungs every 4 (four) hours  as needed for wheezing or shortness of breath.     aspirin EC 325 MG tablet  Take 325 mg by mouth daily.     calcium-vitamin D 500-200 MG-UNIT tablet  Commonly known as:  OSCAL WITH D  Take 1 tablet by mouth daily.     CO Q 10 PO  Take 1 tablet by mouth daily.     COCONUT OIL PO  Take 1 tablet by mouth daily.     colchicine 0.6 MG tablet  Take 1.2 mg by mouth daily.     COPAXONE [place]  Inject into the skin.     diazepam 5 MG tablet  Commonly known as:  VALIUM  TAKE 1 TABLET BY MOUTH EVERY 6 HOURS AS NEEDED FOR LEG SPASMS     furosemide 20 MG tablet  Commonly known as:  LASIX  Take 1 tablet (20 mg total) by mouth daily.     gabapentin 300 MG capsule  Commonly known as:  NEURONTIN  TAKE 1 CAPSULE BY MOUTH 3 TIMES DAILY.     guaiFENesin-codeine 100-10 MG/5ML syrup  Take 5-10 mLs by mouth every 6 (six) hours as needed for cough.     lisinopril 10 MG tablet  Commonly known as:  PRINIVIL,ZESTRIL  TAKE 1 TABLET BY MOUTH DAILY.     methocarbamol 750 MG tablet  Commonly known as:  ROBAXIN  Take 2 tablets (1,500 mg total) by mouth 3 (three) times daily.     oxybutynin 5 MG 24 hr tablet  Commonly known as:  DITROPAN-XL  Take 1 tablet (5 mg total) by mouth at bedtime.     pantoprazole 20 MG tablet  Commonly known as:  PROTONIX  Take 1 tablet (20 mg total) by mouth daily.     predniSONE 20 MG tablet  Commonly known as:  DELTASONE  TAKE 1 TABLET BY MOUTH 3 TIMES DAILY AS NEEDED FOR MS FLARE     predniSONE 10 MG tablet  Commonly known as:  DELTASONE  Please take 3 tabs (30mg ) daily on 3/15, then 2 tabs (20mg ) on 3/16, then 1 tab (10mg ) on 3/17     traMADol 50 MG tablet  Commonly known as:  ULTRAM  TAKE 1 TABLET BY MOUTH 4 TIMES DAILY        Discharge Instructions: Please refer to Patient Instructions section of EMR for full details.  Patient was counseled important signs and symptoms that should prompt return to medical care, changes in medications, dietary  instructions, activity restrictions, and follow up appointments.   Follow-Up Appointments: Follow-up Information    Follow up with Sanjuana Letters, MD On 07/27/2015.   Specialty:  Family Medicine   Why:  at 8:30 AM for hospital follow up visit    Contact information:   [address] [place] [address] [phone]       Please follow up.   Why:  Please make a follow up appointment with your neurologist as soon as possible       Palma Holter, MD 07/20/2015, 9:47 AM PGY-1, [facility]

## 2015-07-19 NOTE — Progress Notes (Signed)
Family Medicine Teaching Service Daily Progress Note Intern Pager: [phone]  Patient name: Barbara [name] Medical record number: [id number] Date of birth: Dec 13, 1955 Age: 60 y.o. Gender: female  Primary Care Provider: Sanjuana Letters, MD Consultants: Neurology Code Status: FULL  Pt Overview and Major Events to Date:  3/8: Admit for worsening LE weakness likely MS flare 3/12: started Prednisone taper per neurology  (after IV Solumedrol)   Barbara [name] is a 60 y.o. female presenting with worsening LE weakness. PMH is significant for MS, HTN, spinal stenosis of lumbar region and cervical region, chronic venous stasis.   Multiple sclerosis w/ current flare: Bilateral LE weakness as primary symptom, with some improvement since admission. Reason for flare possibly advancing disease state. IV Solumedrol  BID for total 6 doses per Neurology >> completed 3/11 - per neurology will Prednisone taper staring with  daily (3/12), then down  increments daily:  today  - PO protonix  - Robaxin 1,500mg  TID  - Gabapentin  TID  - PT/OT consult: PT- CIR - Case management: informed that patient was denied coverage for CIR - SW consult, for placement discussion: home with HH vs SNF rehab  HTN: stable  - Home Lisinopril  daily   AKI, stable:  (baseline 0.7-0.9) - avoid nephrotoxic meds  Urinary Incontinence:  - Oxybutynin   Chronic Venous Stasis: No signs of infection. Takes Lasix daily for LE edema but has not been taking recently -  home Lasix  daily  Chronic Back Pain:  - home Ultram  q 6 hrs  FEN/GI: Heart diet, SLIV Prophylaxis: Lovenox Sub Q  Disposition:  discharge to CIR  Subjective:  States she spoke with [employer] who reported they are waiting for paperwork from Dr. Pollie Meyer (admitting physician). Spoke with Dr. Pollie Meyer who does not have paperwork for this patient.   Objective: Temp:  [97.5 F (36.4 C)-98.1 F (36.7 C)]  97.5 F (36.4 C) (03/15 0641) Pulse Rate:  [55-70] 63 (03/15 0641) Resp:  [16-20] 20 (03/15 0641) BP: (103-117)/(55-80) 117/80 mmHg (03/15 0641) SpO2:  [94 %-97 %] 96 % (03/15 0641) Physical Exam: General: NAD, lying in bed, obese female Cardiovascular:  systolic murmur 2/6 heard equally in right and left upper sternal border Respiratory: normal effort CTAB Abdomen: soft, NT, ND MSK: LE- skin changes from chronic venous insufficiency. Some pitting edema (+1-2) noted bilat.  Extremities: CN 2-12, strength 5/5 in upper extremities bilaterally, sensation intact to light touch in upper extremities. LE: hip flexors 2/5; knee extensors 3/5, plantar flexion 3-4/5.   Laboratory:  Recent Labs Lab 07/13/15 0129 07/13/15 0319 07/14/15 0400  WBC 13.4* 13.4* 11.3*  HGB 14.7 14.6 14.5  HCT 43.9 43.5 44.3  PLT 200 198 198    Recent Labs Lab 07/14/15 0400 07/15/15 0704 07/16/15 0555 07/19/15 0518  NA 143 142 142  --   K 3.7 4.1 4.0  --   CL 104 103 101  --   CO2 --   BUN 20 21* 23*  --   CREATININE 1.05* 0.98 0.95 0.98  CALCIUM 9.6 9.1 9.1  --   GLUCOSE 231* 175* 182*  --    MRI Brain; 3/08:  IMPRESSION: 1. Stable MRI appearance of the brain since 03/05/2015. No new or active demyelinating brain lesion identified. 2. No new intracranial abnormality.  MRI C/T Spine; 3/11 IMPRESSION: 1. Chronic T2 hyperintensity in the cervical cord, unchanged from 2012. No evidence of progressive demyelination in the cervical cord. 2. Similar mild T2 hyperintensity  within the mid thoracic cord. No prior imaging of the thoracic spine available. 3. Interval lower cervical corpectomy and fusion with residual spondylosis as described. No cord compression or progressive foraminal narrowing in the cervical spine. 4. Limited imaging of the thoracic spine in the sagittal plane only demonstrates a circumferential disc osteophyte complex at T7-8, contributing to mild mass effect on the  thoracic cord.   Palma Holter, MD 07/19/2015, 7:16 AM PGY-1, [facility] FPTS Intern pager: [phone], text pages welcome

## 2015-07-19 NOTE — Discharge Instructions (Signed)
You were hospitalized for worsening lower extremity weakness which was thought to be due to MS.  Neurology was consulted and you were initially treated with IV steroids; you are currently on a oral steroid taper as recommended by the neurology team. Please take 30mg  Prednisone today, then 20mg  Prednisone tomorrow 3/16, then 10mg  Prednisone on 3/17 to complete the taper. Please do not take your other prescription for prednisone (prescribed by [name]) while you are completing the taper.   Please make an appointment with your neurologist as soon as possible.   We also kept you on Protonix which is meant to protect your stomach lining in the setting of a high dose course of steroids that you received.   HOME CARE INSTRUCTIONS  Normal daily hygiene and the use of pads or adult diapers that are changed regularly will help prevent odors and skin damage.  Avoid caffeine. It can overstimulate your bladder.  Use the bathroom regularly. Try about every 2-3 hours to go to the bathroom, even if you do not feel the need to do so. Take time to empty your bladder completely. After urinating, wait a minute. Then try to urinate again.  For causes involving nerve dysfunction, keep a log of the medicines you take and a journal of the times you go to the bathroom.

## 2015-07-19 NOTE — Clinical Social Work Note (Signed)
CSW received referral for SNF.  Case discussed with case manager, and plan is to discharge home.  CSW to sign off please re-consult if social work needs arise.  Leelan Rajewski R. Damichael Hofman, MSW, LCSWA [phone]  

## 2015-07-20 ENCOUNTER — Ambulatory Visit: Payer: Self-pay | Admitting: Diagnostic Neuroimaging

## 2015-07-25 ENCOUNTER — Encounter: Payer: Self-pay | Admitting: Diagnostic Neuroimaging

## 2015-07-25 DIAGNOSIS — G35 Multiple sclerosis: Secondary | ICD-10-CM | POA: Diagnosis not present

## 2015-07-25 DIAGNOSIS — I872 Venous insufficiency (chronic) (peripheral): Secondary | ICD-10-CM | POA: Diagnosis not present

## 2015-07-25 DIAGNOSIS — M109 Gout, unspecified: Secondary | ICD-10-CM | POA: Diagnosis not present

## 2015-07-25 DIAGNOSIS — M4806 Spinal stenosis, lumbar region: Secondary | ICD-10-CM | POA: Diagnosis not present

## 2015-07-25 DIAGNOSIS — I10 Essential (primary) hypertension: Secondary | ICD-10-CM | POA: Diagnosis not present

## 2015-07-25 DIAGNOSIS — M4802 Spinal stenosis, cervical region: Secondary | ICD-10-CM | POA: Diagnosis not present

## 2015-07-25 DIAGNOSIS — E669 Obesity, unspecified: Secondary | ICD-10-CM | POA: Diagnosis not present

## 2015-07-27 ENCOUNTER — Inpatient Hospital Stay: Payer: PPO | Admitting: Family Medicine

## 2015-07-31 ENCOUNTER — Other Ambulatory Visit: Payer: Self-pay | Admitting: Family Medicine

## 2015-07-31 DIAGNOSIS — M109 Gout, unspecified: Secondary | ICD-10-CM | POA: Diagnosis not present

## 2015-07-31 DIAGNOSIS — I10 Essential (primary) hypertension: Secondary | ICD-10-CM | POA: Diagnosis not present

## 2015-07-31 DIAGNOSIS — M4802 Spinal stenosis, cervical region: Secondary | ICD-10-CM | POA: Diagnosis not present

## 2015-07-31 DIAGNOSIS — E669 Obesity, unspecified: Secondary | ICD-10-CM | POA: Diagnosis not present

## 2015-07-31 DIAGNOSIS — G35 Multiple sclerosis: Secondary | ICD-10-CM | POA: Diagnosis not present

## 2015-07-31 DIAGNOSIS — M4806 Spinal stenosis, lumbar region: Secondary | ICD-10-CM | POA: Diagnosis not present

## 2015-07-31 DIAGNOSIS — I872 Venous insufficiency (chronic) (peripheral): Secondary | ICD-10-CM | POA: Diagnosis not present

## 2015-07-31 MED FILL — GABAPENTIN 300 MG CAPSULE: 300 | 90 days supply | Qty: 270 | Fill #1

## 2015-07-31 MED FILL — OXYBUTYNIN CL ER 5 MG TAB: 5 | 30 days supply | Qty: 30 | Fill #5

## 2015-07-31 MED FILL — predniSONE 20 MG TABS: 20 | 15 days supply | Qty: 45 | Fill #3

## 2015-07-31 MED FILL — METHOCARBAMOL 750 MG TABLET: 750 | 30 days supply | Qty: 180 | Fill #8

## 2015-08-01 DIAGNOSIS — M109 Gout, unspecified: Secondary | ICD-10-CM | POA: Diagnosis not present

## 2015-08-01 DIAGNOSIS — I10 Essential (primary) hypertension: Secondary | ICD-10-CM | POA: Diagnosis not present

## 2015-08-01 DIAGNOSIS — E669 Obesity, unspecified: Secondary | ICD-10-CM | POA: Diagnosis not present

## 2015-08-01 DIAGNOSIS — M4802 Spinal stenosis, cervical region: Secondary | ICD-10-CM | POA: Diagnosis not present

## 2015-08-01 DIAGNOSIS — I872 Venous insufficiency (chronic) (peripheral): Secondary | ICD-10-CM | POA: Diagnosis not present

## 2015-08-01 DIAGNOSIS — M4806 Spinal stenosis, lumbar region: Secondary | ICD-10-CM | POA: Diagnosis not present

## 2015-08-01 DIAGNOSIS — G35 Multiple sclerosis: Secondary | ICD-10-CM | POA: Diagnosis not present

## 2015-08-01 MED FILL — traMADol HCL 50 MG TABS: 50 | 90 days supply | Qty: 360 | Fill #0

## 2015-08-02 DIAGNOSIS — I10 Essential (primary) hypertension: Secondary | ICD-10-CM | POA: Diagnosis not present

## 2015-08-02 DIAGNOSIS — M4806 Spinal stenosis, lumbar region: Secondary | ICD-10-CM | POA: Diagnosis not present

## 2015-08-02 DIAGNOSIS — M4802 Spinal stenosis, cervical region: Secondary | ICD-10-CM | POA: Diagnosis not present

## 2015-08-02 DIAGNOSIS — I872 Venous insufficiency (chronic) (peripheral): Secondary | ICD-10-CM | POA: Diagnosis not present

## 2015-08-02 DIAGNOSIS — E669 Obesity, unspecified: Secondary | ICD-10-CM | POA: Diagnosis not present

## 2015-08-02 DIAGNOSIS — G35 Multiple sclerosis: Secondary | ICD-10-CM | POA: Diagnosis not present

## 2015-08-02 DIAGNOSIS — M109 Gout, unspecified: Secondary | ICD-10-CM | POA: Diagnosis not present

## 2015-08-03 ENCOUNTER — Encounter: Payer: Self-pay | Admitting: Family Medicine

## 2015-08-03 ENCOUNTER — Inpatient Hospital Stay: Payer: PPO | Admitting: Family Medicine

## 2015-08-05 DIAGNOSIS — E669 Obesity, unspecified: Secondary | ICD-10-CM | POA: Diagnosis not present

## 2015-08-05 DIAGNOSIS — M4806 Spinal stenosis, lumbar region: Secondary | ICD-10-CM | POA: Diagnosis not present

## 2015-08-05 DIAGNOSIS — M4802 Spinal stenosis, cervical region: Secondary | ICD-10-CM | POA: Diagnosis not present

## 2015-08-05 DIAGNOSIS — I10 Essential (primary) hypertension: Secondary | ICD-10-CM | POA: Diagnosis not present

## 2015-08-05 DIAGNOSIS — M109 Gout, unspecified: Secondary | ICD-10-CM | POA: Diagnosis not present

## 2015-08-05 DIAGNOSIS — G35 Multiple sclerosis: Secondary | ICD-10-CM | POA: Diagnosis not present

## 2015-08-05 DIAGNOSIS — I872 Venous insufficiency (chronic) (peripheral): Secondary | ICD-10-CM | POA: Diagnosis not present

## 2015-08-07 DIAGNOSIS — M4802 Spinal stenosis, cervical region: Secondary | ICD-10-CM | POA: Diagnosis not present

## 2015-08-07 DIAGNOSIS — E669 Obesity, unspecified: Secondary | ICD-10-CM | POA: Diagnosis not present

## 2015-08-07 DIAGNOSIS — M109 Gout, unspecified: Secondary | ICD-10-CM | POA: Diagnosis not present

## 2015-08-07 DIAGNOSIS — I872 Venous insufficiency (chronic) (peripheral): Secondary | ICD-10-CM | POA: Diagnosis not present

## 2015-08-07 DIAGNOSIS — M4806 Spinal stenosis, lumbar region: Secondary | ICD-10-CM | POA: Diagnosis not present

## 2015-08-07 DIAGNOSIS — G35 Multiple sclerosis: Secondary | ICD-10-CM | POA: Diagnosis not present

## 2015-08-07 DIAGNOSIS — I10 Essential (primary) hypertension: Secondary | ICD-10-CM | POA: Diagnosis not present

## 2015-08-08 DIAGNOSIS — M4806 Spinal stenosis, lumbar region: Secondary | ICD-10-CM | POA: Diagnosis not present

## 2015-08-08 DIAGNOSIS — E669 Obesity, unspecified: Secondary | ICD-10-CM | POA: Diagnosis not present

## 2015-08-08 DIAGNOSIS — M4802 Spinal stenosis, cervical region: Secondary | ICD-10-CM | POA: Diagnosis not present

## 2015-08-08 DIAGNOSIS — G35 Multiple sclerosis: Secondary | ICD-10-CM | POA: Diagnosis not present

## 2015-08-08 DIAGNOSIS — I10 Essential (primary) hypertension: Secondary | ICD-10-CM | POA: Diagnosis not present

## 2015-08-08 DIAGNOSIS — I872 Venous insufficiency (chronic) (peripheral): Secondary | ICD-10-CM | POA: Diagnosis not present

## 2015-08-08 DIAGNOSIS — M109 Gout, unspecified: Secondary | ICD-10-CM | POA: Diagnosis not present

## 2015-08-10 DIAGNOSIS — M109 Gout, unspecified: Secondary | ICD-10-CM | POA: Diagnosis not present

## 2015-08-10 DIAGNOSIS — I10 Essential (primary) hypertension: Secondary | ICD-10-CM | POA: Diagnosis not present

## 2015-08-10 DIAGNOSIS — M4802 Spinal stenosis, cervical region: Secondary | ICD-10-CM | POA: Diagnosis not present

## 2015-08-10 DIAGNOSIS — G35 Multiple sclerosis: Secondary | ICD-10-CM | POA: Diagnosis not present

## 2015-08-10 DIAGNOSIS — I872 Venous insufficiency (chronic) (peripheral): Secondary | ICD-10-CM | POA: Diagnosis not present

## 2015-08-10 DIAGNOSIS — E669 Obesity, unspecified: Secondary | ICD-10-CM | POA: Diagnosis not present

## 2015-08-10 DIAGNOSIS — M4806 Spinal stenosis, lumbar region: Secondary | ICD-10-CM | POA: Diagnosis not present

## 2015-08-15 DIAGNOSIS — E669 Obesity, unspecified: Secondary | ICD-10-CM | POA: Diagnosis not present

## 2015-08-15 DIAGNOSIS — M4802 Spinal stenosis, cervical region: Secondary | ICD-10-CM | POA: Diagnosis not present

## 2015-08-15 DIAGNOSIS — M4806 Spinal stenosis, lumbar region: Secondary | ICD-10-CM | POA: Diagnosis not present

## 2015-08-15 DIAGNOSIS — I10 Essential (primary) hypertension: Secondary | ICD-10-CM | POA: Diagnosis not present

## 2015-08-15 DIAGNOSIS — M109 Gout, unspecified: Secondary | ICD-10-CM | POA: Diagnosis not present

## 2015-08-15 DIAGNOSIS — G35 Multiple sclerosis: Secondary | ICD-10-CM | POA: Diagnosis not present

## 2015-08-15 DIAGNOSIS — I872 Venous insufficiency (chronic) (peripheral): Secondary | ICD-10-CM | POA: Diagnosis not present

## 2015-08-17 DIAGNOSIS — M4802 Spinal stenosis, cervical region: Secondary | ICD-10-CM | POA: Diagnosis not present

## 2015-08-17 DIAGNOSIS — G35 Multiple sclerosis: Secondary | ICD-10-CM | POA: Diagnosis not present

## 2015-08-17 DIAGNOSIS — M109 Gout, unspecified: Secondary | ICD-10-CM | POA: Diagnosis not present

## 2015-08-17 DIAGNOSIS — I872 Venous insufficiency (chronic) (peripheral): Secondary | ICD-10-CM | POA: Diagnosis not present

## 2015-08-17 DIAGNOSIS — I10 Essential (primary) hypertension: Secondary | ICD-10-CM | POA: Diagnosis not present

## 2015-08-17 DIAGNOSIS — M4806 Spinal stenosis, lumbar region: Secondary | ICD-10-CM | POA: Diagnosis not present

## 2015-08-17 DIAGNOSIS — E669 Obesity, unspecified: Secondary | ICD-10-CM | POA: Diagnosis not present

## 2015-08-25 ENCOUNTER — Encounter: Payer: Self-pay | Admitting: Family Medicine

## 2015-08-28 ENCOUNTER — Encounter: Payer: Self-pay | Admitting: Family Medicine

## 2015-08-28 DIAGNOSIS — L03119 Cellulitis of unspecified part of limb: Principal | ICD-10-CM

## 2015-08-28 DIAGNOSIS — L02419 Cutaneous abscess of limb, unspecified: Secondary | ICD-10-CM | POA: Insufficient documentation

## 2015-08-28 MED ORDER — DOXYCYCLINE HYCLATE 100 MG PO TABS: 100.0000 mg | ORAL_TABLET | Freq: Two times a day (BID) | ORAL | Status: DC

## 2015-08-28 NOTE — Telephone Encounter (Signed)
Tried to call x 2 and left message on home and cell phones.  I will hold off any Rx until I can speak to her directly.

## 2015-08-28 NOTE — Assessment & Plan Note (Signed)
Legs quite red.  No fever

## 2015-08-29 ENCOUNTER — Other Ambulatory Visit: Payer: Self-pay | Admitting: Family Medicine

## 2015-08-29 MED FILL — diazePAM 5 MG TABS: 5 | 22 days supply | Qty: 90 | Fill #0

## 2015-08-29 MED FILL — METHOCARBAMOL 750 MG TABLET: 750 | 30 days supply | Qty: 180 | Fill #9

## 2015-08-29 MED FILL — LISINOPRIL 10 MG TABLET: 10 | 90 days supply | Qty: 90 | Fill #0

## 2015-08-30 ENCOUNTER — Telehealth: Payer: Self-pay | Admitting: *Deleted

## 2015-08-30 DIAGNOSIS — G35 Multiple sclerosis: Secondary | ICD-10-CM

## 2015-08-30 NOTE — Telephone Encounter (Signed)
Pt would like to speak with [name] at his convenience.  She is considering going to an assisted living facility, but wants to ask [name] some questions first. Ehan Freas, Maryjo Rochester, CMA

## 2015-08-31 ENCOUNTER — Telehealth: Payer: Self-pay | Admitting: Family Medicine

## 2015-08-31 DIAGNOSIS — L03119 Cellulitis of unspecified part of limb: Principal | ICD-10-CM

## 2015-08-31 DIAGNOSIS — L02419 Cutaneous abscess of limb, unspecified: Secondary | ICD-10-CM

## 2015-08-31 MED ORDER — DOXYCYCLINE HYCLATE 100 MG PO TABS: 100.0000 mg | ORAL_TABLET | Freq: Two times a day (BID) | ORAL | Status: DC

## 2015-08-31 MED FILL — DOXYCYCLINE HYCLATE 100 MG: 100 | 10 days supply | Qty: 20 | Fill #0

## 2015-08-31 NOTE — Telephone Encounter (Signed)
Rx sent again.  By EMR, it looks like I sent it on 4/27

## 2015-08-31 NOTE — Telephone Encounter (Signed)
Outpatient pharmacy has not received Rx for doxycycline.  Rx resent to pharmacy.  Clovis Pu, RN

## 2015-08-31 NOTE — Telephone Encounter (Signed)
Pt had  Called a couple of days ago and was told that we would send in Doxycycline to her pharmacy. SHe has been checking but the medication is not there. Epic shows we sent or maybe called this in on 08/28/15. Can we do this again. jw

## 2015-08-31 NOTE — Telephone Encounter (Signed)
Patient wants to try to maintain home independent living.  Has spoken with [facility] at [facility] and recommends motorized wheelchair and hospital bed.  I will start the process by putting orders in to be [facility]ed to [facility].  I am certain there will be more paperwork to fill out.

## 2015-08-31 NOTE — Addendum Note (Signed)
Addended by: Moses Manners on: 08/31/2015 11:08 AM   Modules accepted: Orders

## 2015-09-04 NOTE — Telephone Encounter (Signed)
Forms were faxed to [facility] on 08/31/2015. [name], Karam Dunson D, [place]

## 2015-09-29 ENCOUNTER — Other Ambulatory Visit: Payer: Self-pay | Admitting: Family Medicine

## 2015-09-29 MED FILL — predniSONE 20 MG TABS: 20 | 15 days supply | Qty: 45 | Fill #0

## 2015-09-29 MED FILL — OXYBUTYNIN CL ER 5 MG TAB: 5 | 30 days supply | Qty: 30 | Fill #6

## 2015-10-19 MED FILL — METHOCARBAMOL 750 MG TABLET: 750 | 30 days supply | Qty: 180 | Fill #10

## 2015-10-20 ENCOUNTER — Other Ambulatory Visit: Payer: Self-pay | Admitting: Family Medicine

## 2015-10-20 DIAGNOSIS — L02419 Cutaneous abscess of limb, unspecified: Secondary | ICD-10-CM

## 2015-10-20 DIAGNOSIS — L03119 Cellulitis of unspecified part of limb: Principal | ICD-10-CM

## 2015-10-20 NOTE — Telephone Encounter (Signed)
Refill request for doxycycline. Please send to [facility] [address] since Outpatient pharmacy will be closed this weekend.

## 2015-10-23 ENCOUNTER — Telehealth: Payer: Self-pay | Admitting: *Deleted

## 2015-10-23 MED ORDER — DOXYCYCLINE HYCLATE 100 MG PO TABS: 100.0000 mg | ORAL_TABLET | Freq: Two times a day (BID) | ORAL | Status: DC

## 2015-10-23 MED ORDER — OXYBUTYNIN CHLORIDE ER 5 MG PO TB24: 5.0000 mg | ORAL_TABLET | Freq: Every day | ORAL | Status: DC

## 2015-10-23 MED FILL — OXYBUTYNIN CL ER 5 MG TAB: 5 | 30 days supply | Qty: 30 | Fill #0

## 2015-10-23 NOTE — Telephone Encounter (Signed)
Spoke with patient to inquire if she had requested refill on Oxybutynin and as to why she didn't schedule a 6 month follow up, last seen 03/07/15. She stated that she and Dr Marjory Lies discussed her coming back in 6 months to see how she was doing on Copaxone. However even with assistance she could not afford it, and she sent My Chart to Dr Marjory Lies 07/25/15 to inform him. She stated "I am doing fine. I don't have any new problems, so I didn't schedule a follow up."   Advised that she should be seen again, at least on a yearly basis, and she agreed. She will call back to schedule appointment after she arranges transportation.  Inquired if she was getting relief of incontinence with Oxybutynin; she stated medication was helping with her bladder incontinence. Informed her this RN will refill her medication and advised she call back to schedule follow up for beginning of Nov 2017. She verbalized understanding, agreement, appreciation for call.

## 2015-10-27 MED FILL — GABAPENTIN 300 MG CAPSULE: 300 | 90 days supply | Qty: 270 | Fill #2

## 2015-10-30 MED FILL — traMADol HCL 50 MG TABS: 50 | 90 days supply | Qty: 360 | Fill #1

## 2015-11-14 MED FILL — METHOCARBAMOL 750 MG TABLET: 750 | 30 days supply | Qty: 180 | Fill #11

## 2015-11-14 MED FILL — FUROSEMIDE 20 MG TABLET: 20 | 30 days supply | Qty: 30 | Fill #1

## 2015-11-14 MED FILL — predniSONE 20 MG TABS: 20 | 15 days supply | Qty: 45 | Fill #1

## 2015-11-30 ENCOUNTER — Telehealth: Payer: Self-pay | Admitting: Family Medicine

## 2015-11-30 DIAGNOSIS — G35 Multiple sclerosis: Secondary | ICD-10-CM

## 2015-11-30 NOTE — Telephone Encounter (Signed)
Pt has an appointment on Tuesday (12-05-15) with outpatient rehab for a wheelchair evaluation. She needs a referral for this appointment sent to her insurance company [facility]) in order for them to cover the visit. Please call her and let her know when it has been sent in. If we are unable to complete by tomorrow afternoon please call her (home number) and let her know so she can reschedule the appointment. Thanks! ep

## 2015-12-01 MED FILL — OXYBUTYNIN CL ER 5 MG TAB: 5 | 30 days supply | Qty: 30 | Fill #1

## 2015-12-05 ENCOUNTER — Encounter: Payer: Self-pay | Admitting: Diagnostic Neuroimaging

## 2015-12-05 ENCOUNTER — Ambulatory Visit (INDEPENDENT_AMBULATORY_CARE_PROVIDER_SITE_OTHER): Payer: PPO | Admitting: Diagnostic Neuroimaging

## 2015-12-05 ENCOUNTER — Other Ambulatory Visit: Payer: PPO

## 2015-12-05 ENCOUNTER — Ambulatory Visit: Payer: PPO | Attending: Family Medicine | Admitting: Physical Therapy

## 2015-12-05 ENCOUNTER — Encounter: Payer: Self-pay | Admitting: Physical Therapy

## 2015-12-05 VITALS — BP 155/93 | HR 83

## 2015-12-05 DIAGNOSIS — M6281 Muscle weakness (generalized): Secondary | ICD-10-CM | POA: Insufficient documentation

## 2015-12-05 DIAGNOSIS — R531 Weakness: Secondary | ICD-10-CM | POA: Diagnosis not present

## 2015-12-05 DIAGNOSIS — G35 Multiple sclerosis: Secondary | ICD-10-CM | POA: Diagnosis not present

## 2015-12-05 DIAGNOSIS — Z7409 Other reduced mobility: Secondary | ICD-10-CM

## 2015-12-05 DIAGNOSIS — R2991 Unspecified symptoms and signs involving the musculoskeletal system: Secondary | ICD-10-CM | POA: Insufficient documentation

## 2015-12-05 DIAGNOSIS — M4806 Spinal stenosis, lumbar region: Secondary | ICD-10-CM

## 2015-12-05 DIAGNOSIS — M48061 Spinal stenosis, lumbar region without neurogenic claudication: Secondary | ICD-10-CM

## 2015-12-05 MED ORDER — OXYBUTYNIN CHLORIDE ER 5 MG PO TB24: 5.0000 mg | ORAL_TABLET | Freq: Every day | ORAL | 4 refills | Status: DC

## 2015-12-05 MED ORDER — GABAPENTIN 300 MG PO CAPS: 300.0000 mg | ORAL_CAPSULE | Freq: Three times a day (TID) | ORAL | 4 refills | Status: DC

## 2015-12-05 NOTE — Addendum Note (Signed)
Addended byJoycelyn Schmid on: 12/05/2015 04:04 PM   Modules accepted: Orders

## 2015-12-05 NOTE — Progress Notes (Addendum)
[facility]  PATIENT: Barbara [name] DOB: 28-Jan-1956  REFERRING CLINICIAN:  HISTORY FROM: patient and sister  REASON FOR VISIT: follow up   HISTORICAL  CHIEF COMPLAINT:  Chief Complaint  Patient presents with  . Multiple Sclerosis    rm 7, sister, [name], "not any different than I was when I was in the hospital in March for flare up"  . Follow-up    6 month    HISTORY OF PRESENT ILLNESS:   UPDATE 12/05/15: Since last visit, went to ER in March 2017, for lower ext weakness. Admitted and treated with IV steroids. Now back to baseline. Still not able to afford medication even with insurance.   UPDATE 03/07/15 ([facility]): Since last visit, continues to have difficulty walking. Had continued left leg weakness. More falls. Now in wheelchair x 1 month. Still off DMTs for MS due to insurance / financial issues.   UPDATE 03/08/14 (LL): Since last visit patient was approved for SSI disability. She is not eligible for Medicare Until April 2016. She had health insurance briefly with [employer]ias, but Copaxone was still going to be $3000/month so she has been without any DMT since earlier this year. She states her long term disability from her job was discontinued because [employer] did not receive the required documentation from our office. We have fax confirmation that a [employer] Form" was completed and faxed on 05/25/13. She comes in today to see if there is any financial assistance from any drug companies to get back on treatment. Her mobility is poor but she is able to walk with a rolling walker. Her last fall was in May without injury. She thinks she had an MS exacerbation in August, she felt all-over numbness. [name] put her on a steroid taper and she felt better.  UPDATE 03/19/13: Since last visit patient had lumbar decompression and fusion surgery at [facility] in May 2014. Following this she had significant difficulty walking and  increased urinary incontinence. Patient followed up with her surgeon, who felt that the symptoms were more related to MS exacerbations rather than her of lumbar spine stenosis and surgery. Unfortunately patient does not have any insurance and is not able to afford her Copaxone. She has had injections left from when she was in the nursing home for rehabilitation, and now she is taking the injections every other day to stretch them out. She's in the process of applying for Medicare and disability, and has some lawyers working on her behalf to pursue this.  PRIOR HPI (07/02/12, Dr. Sandria Manly): 81 year old left-handed white widowed female with a 5 year history of a tendency to fall forward while walking. Occasionally she will have retropulsion. She feels like "I'm going to fall". She had fallen numerous times and is uses a walker. Falls assessment tool score was 17. She has weakness in her left foot and leg more than the right. She has lower back pain and was seen by Dr. Newell Coral with MRI study of the lumbar spine without contrast 11/10/2008 showing spondylosis with moderately severe central canal and lateral recess stenosis of L4-5 and at L2-3 and L3-4. She has a three-year history of bladder symptoms with urinary incontinence. She does not have pain radiating into her legs. While walking she feels her legs begin to get weak and she falls forward. She has undergone epidural steroid injections by Dr. Alveda Reasons without benefit. She describes episodes in which her legs "jump" and she has no control. She says her legs are  like "thumper". This occurs when she is getting into the shower.She has neck pain but none radiating into her arms. At times her left and right hand feel numb. At one point she was thought to have carpal tunnel syndrome. She denies Lhermitte's sign.05/16/2010 she noticed that her right eyelid was sore. 05/18/2010 she could not see in the right eye. She went to an optometrist. She was seen 05/23/2010 by Dr.  Jethro Bolus and referred to [facility] who noted edema and a swollen disc on the right with NLP vision from optic neuritis. MRI study of the brain and cervical spine without and with contrast 05/23/10 showed focal T2 hyperintensity in the right optic nerve, one adjacent to the left lateral ventricle, and 3 discrete foci of T2 hyperintensity within the spinal cord. There was moderate central stenosis at C5-6 secondary to right paracentral disc protrusion and cord signal abnormality at C5-6 ,possibly secondary to the disc disease present. She began 1000 mg of IV Solu-Medrol daily for 3 days 05/25/2010 without improvement in NLP vision. Sjogren's antibodies, NMO IgG auto antibodies, ACE, ANA,sedimentation rate, B12, and Lyme titer were negative.CSF 06/15/2010 revealed 6 WBC, 0RBC, protein 66, glucose 85, VDRL NR, greater than 5 oligoclonal bands, CSF IgG 6.4 and normal ACE. She was on Betaseron 06/2010 and noticed the next day her legs didn't move as well. She was titrated to full dose with flu like symptoms, leg cramps, episodes of crying, and decreased urination. She discontinued Betaseron with much less falling and resolution of Flu symptoms.Cevical MRI without and with contrast 09/19/2010 showed increased cord signal in the cervical spine at C6-7 suggestive of progressive disease and other cord signal abnormality at C2-3 and C4 which was stable. MRI of the lumbar spine 09/19/2010 showed normal signal in the medullaris and multifocal spondylosis with right lateral recess and foraminal stenosis at L2-L3 and L3-4 and left greater than right lateral recess stenosis of L4- L5 She has severe lower back pain. Lumbar MRI 10/16/013 showed progressive disease. She started Copaxone 03/23/11 with improvement in symptoms. She has back pain down the left greater than right leg, worse with walking and activity.She underwent C5-6 C6-7 and C7-T1 diskectomy and fusion with plate and screws 06/14/2011 by Dr. Timoteo Ace She has done  well postoperatively, but no improvement in numbness in her hands and feet. Dr. Timoteo Ace is treating her lumbar spinal stenosis with epidural steroids, acupuncture, and then consideration of surgery. She walked 10 feet without a walker and fell 02/24/12. At [facility] CT of the head and cervical spine and lumbarspine x-rays showed no acute intracranial abnormality, cervical fusion, and DJD in the lumbar spine. Back pain increases with walking. [facility] OT felt she is disabled. Pain is 3-7/10 treated with tramadol and ibuprofen She has numbness left greater than right hand, dropping things.  REVIEW OF SYSTEMS: Full 14 system review of systems performed and negative excep: leg swelling fatigue bladder incontinence numbness weakness.  ALLERGIES: No Known Allergies  HOME MEDICATIONS: Outpatient Medications Prior to Visit  Medication Sig Dispense Refill  . aspirin EC 325 MG tablet Take 325 mg by mouth daily.    . calcium-vitamin D (OSCAL WITH D) 500-200 MG-UNIT per tablet Take 1 tablet by mouth daily.      . COCONUT OIL PO Take 1 tablet by mouth daily.    . Coenzyme Q10 (CO Q 10 PO) Take 1 tablet by mouth daily.    . colchicine 0.6 MG tablet Take 1.2 mg by mouth daily.     Marland Kitchen  diazepam (VALIUM) 5 MG tablet TAKE 1 TABLET BY MOUTH EVERY 6 HOURS AS NEEDED FOR LEG SPASMS 90 tablet 1  . doxycycline (VIBRA-TABS) 100 MG tablet Take 1 tablet (100 mg total) by mouth 2 (two) times daily. 20 tablet 0  . furosemide (LASIX) 20 MG tablet Take 1 tablet (20 mg total) by mouth daily. 30 tablet 3  . lisinopril (PRINIVIL,ZESTRIL) 10 MG tablet TAKE 1 TABLET BY MOUTH ONCE DAILY 90 tablet 3  . methocarbamol (ROBAXIN) 750 MG tablet Take 2 tablets (1,500 mg total) by mouth 3 (three) times daily. 540 tablet 3  . pantoprazole (PROTONIX) 20 MG tablet Take 1 tablet (20 mg total) by mouth daily. 30 tablet 0  . predniSONE (DELTASONE) 20 MG tablet TAKE 1 TABLET BY MOUTH 3 TIMES DAILY AS NEEDED FOR MS FLARE 45 tablet 3  .  traMADol (ULTRAM) 50 MG tablet TAKE 1 TABLET BY MOUTH 4 TIMES DAILY 360 tablet 0  . gabapentin (NEURONTIN) 300 MG capsule TAKE 1 CAPSULE BY MOUTH 3 TIMES DAILY. 270 capsule 3  . oxybutynin (DITROPAN-XL) 5 MG 24 hr tablet Take 1 tablet (5 mg total) by mouth at bedtime. 30 tablet 6  . albuterol (PROVENTIL HFA;VENTOLIN HFA) 108 (90 BASE) MCG/ACT inhaler Inhale 2 puffs into the lungs every 4 (four) hours as needed for wheezing or shortness of breath. 1 Inhaler 0  . Glatiramer Acetate (COPAXONE Sunrise) Inject into the skin.    Marland Kitchen guaiFENesin-codeine 100-10 MG/5ML syrup Take 5-10 mLs by mouth every 6 (six) hours as needed for cough. 120 mL 0   No facility-administered medications prior to visit.     PAST MEDICAL HISTORY: Past Medical History:  Diagnosis Date  . Gout   . Lower back pain   . Lumbar spondylosis   . MS (multiple sclerosis) (HCC)   . Numbness   . Spinal stenosis   . Weakness     PAST SURGICAL HISTORY: Past Surgical History:  Procedure Laterality Date  . CERVICAL DISC SURGERY     Qusetions C5-6 C6-7 and C7-T1    FAMILY HISTORY: Family History  Problem Relation Age of Onset  . Heart disease Mother   . Diabetes Mother   . Cancer Father     SOCIAL HISTORY:  Social History   Social History  . Marital status: Widowed    Spouse name: N/A  . Number of children: 0  . Years of education: college   Occupational History  .  [employer]   Social History Main Topics  . Smoking status: Never Smoker  . Smokeless tobacco: Never Used  . Alcohol use No  . Drug use: No  . Sexual activity: Not Currently     Comment: husband died 11/08/2010   Other Topics Concern  . Not on file   Social History Narrative   Patient lives at home alone.   Caffeine Use: 1 cup daily     PHYSICAL EXAM   GENERAL EXAM/CONSTITUTIONAL: Vitals:  Vitals:   12/05/15 1416  BP: (!) 155/93  Pulse: 83   There is no height or weight on file to calculate BMI. No exam data present  Patient is in  no distress; well developed, nourished and groomed; neck is supple  CARDIOVASCULAR:  Examination of carotid arteries is normal; no carotid bruits  Regular rate and rhythm, no murmurs  Examination of peripheral vascular system by observation and palpation is normal  EYES:  Ophthalmoscopic exam of optic discs and posterior segments is normal; no papilledema or hemorrhages  MUSCULOSKELETAL:  Gait, strength, tone, movements noted in Neurologic exam below  NEUROLOGIC: MENTAL STATUS:  No flowsheet data found.  awake, alert, oriented to person, place and time  recent and remote memory intact  normal attention and concentration  language fluent, comprehension intact, naming intact,   fund of knowledge appropriate  CRANIAL NERVE:   2nd - no papilledema on fundoscopic exam  2nd, 3rd, 4th, 6th - pupils equal and reactive to light, visual fields full to confrontation, extraocular muscles intact, no nystagmus  5th - facial sensation symmetric  7th - facial strength symmetric  8th - hearing intact  9th - palate elevates symmetrically, uvula midline  11th - shoulder shrug symmetric  12th - tongue protrusion midline  MOTOR:   normal bulk and tone, full strength in the BUE; BLE HF 1-2; TOES 1-2; INCREASED TONE IN BLE  SENSORY:   normal and symmetric to light touch  COORDINATION:   finger-nose-finger, fine finger movements SLOW  REFLEXES:   deep tendon reflexes TRACE and symmetric  GAIT/STATION:   IN WHEEL CHAIR    DIAGNOSTIC DATA (LABS, IMAGING, TESTING) - I reviewed patient records, labs, notes, testing and imaging myself where available.  Lab Results  Component Value Date   WBC 11.3 (H) 07/14/2015   HGB 14.5 07/14/2015   HCT 44.3 07/14/2015   MCV 89.7 07/14/2015   PLT 198 07/14/2015      Component Value Date/Time   NA 142 07/16/2015 0555   K 4.0 07/16/2015 0555   CL 101 07/16/2015 0555   CO2 27 07/16/2015 0555   GLUCOSE 182 (H) 07/16/2015 0555    BUN 23 (H) 07/16/2015 0555   CREATININE 0.98 07/19/2015 0518   CREATININE 0.74 02/09/2015 1211   CALCIUM 9.1 07/16/2015 0555   PROT 7.0 02/09/2015 1211   ALBUMIN 4.0 02/09/2015 1211   AST 52 (H) 02/09/2015 1211   ALT 28 02/09/2015 1211   ALKPHOS 105 02/09/2015 1211   BILITOT 0.6 02/09/2015 1211   GFRNONAA >60 07/19/2015 0518   GFRNONAA 80 11/01/2011 1116   GFRAA >60 07/19/2015 0518   GFRAA >89 11/01/2011 1116   Lab Results  Component Value Date   CHOL 167 02/09/2015   HDL 23 (L) 02/09/2015   LDLCALC 101 02/09/2015   TRIG 216 (H) 02/09/2015   CHOLHDL 7.3 (H) 02/09/2015   No results found for: HGBA1C No results found for: VITAMINB12 Lab Results  Component Value Date   TSH 3.577 02/09/2015    03/05/15 MRI brain (with and without) demonstrating: 1. Small left lateral ventricle periventricular chronic demyelinating plaque.  2. No acute plaques. 3. Compared to MRI on 05/23/10, no new plaques are seen and prior right optic nerve enhancement has resolved.     ASSESSMENT AND PLAN  60 y.o. year old female here with multiple sclerosis. Some progression of gait difficulties, bladder incontinence. Could be MS related (i.e. New spinal cord lesions) + lumbar spine disease.    Dx:  MS (multiple sclerosis) (HCC)  Spinal stenosis of lumbar region  Weakness     PLAN: - check labs - then consider ocrevus, tysabri, tecfidera, copaxone/glatopa - continue oxybutynin and gabapentin  Orders Placed This Encounter  Procedures  . Stratify JCV Antibody Test ([facility])  . CBC with Differential/Platelet  . Comprehensive metabolic panel  . Hepatitis B Core AB, Total  . Hepatitis B surface antigen  . Hepatitis B surface antibody  . Hepatitis c antibody (reflex)  . Varicella zoster antibody, IgG  . Quantiferon tb gold assay  Meds ordered this encounter  Medications  . oxybutynin (DITROPAN-XL) 5 MG 24 hr tablet    Sig: Take 1 tablet (5 mg total) by mouth at bedtime.    Dispense:   90 tablet    Refill:  4  . gabapentin (NEURONTIN) 300 MG capsule    Sig: Take 1 capsule (300 mg total) by mouth 3 (three) times daily.    Dispense:  270 capsule    Refill:  4   Return in about 6 months (around 06/06/2016).    Suanne Marker, MD 12/05/2015, 2:45 PM Certified in Neurology, Neurophysiology and Neuroimaging  [facility] [address], [address] [place], [place] [address] [phone]

## 2015-12-05 NOTE — Patient Instructions (Signed)
-   then consider ocrevus, tysabri, tecfidera, copaxone/glatopa

## 2015-12-05 NOTE — Therapy (Signed)
[facility] [address] [place], [place], [address] Phone: [phone]   Fax:  [phone]  Physical Therapy Evaluation  Patient Details  Name: Barbara [name] MRN: [id number] Date of Birth: 1955/07/26 No Data Recorded  Encounter Date: 12/05/2015      PT End of Session - 12/05/15 1630    Visit Number 1   Number of Visits 1   Authorization Type Medicare   PT Start Time 1505   PT Stop Time 1630   PT Time Calculation (min) 85 min   Activity Tolerance Patient limited by fatigue;Patient limited by pain;Patient tolerated treatment well   Behavior During Therapy [facility] for tasks assessed/performed      Past Medical History:  Diagnosis Date  . Gout   . Lower back pain   . Lumbar spondylosis   . MS (multiple sclerosis) ([place])   . Numbness   . Spinal stenosis   . Weakness     Past Surgical History:  Procedure Laterality Date  . CERVICAL DISC SURGERY     Qusetions C5-6 C6-7 and C7-T1    There were no vitals filed for this visit.       Subjective Assessment - 12/05/15 1505    Subjective This 60yo female with progressive MS presents for power wheelchair evaluation.    Patient is accompained by: Family member   Limitations Sitting;Standing;Walking;House hold activities   Patient Stated Goals Household mobility & ADLs ability with power w/c       Mobility/Seating Evaluation    PATIENT INFORMATION: Name: Barbara [name] DOB: December 11, 1955  Sex: Female Date seen: 12/05/2015 Time: 14:45  Address:  [address] [place], [place] [address] Physician: Madison Hickman, MD This evaluation/justification form will serve as the LMN for the following suppliers: __________________________ Supplier: [facility] [employer] Contact Person: Luz Brazen, Wess Botts Phone:  [phone]   Seating Therapist: Jamey Reas, PT Phone:   [phone]   Phone: [phone] (Home) [phone] (Mobile)    Spouse/Parent/Caregiver name: Edwina Barth, sister  Phone number: [phone] Insurance/Payer: [employer]      Reason for Referral: Power w/c  Patient/Caregiver Goals: She would like power w/c in her home without fatigue & perform household activities. Get to her bed to laydown to elevate LEs  Patient was seen for face-to-face evaluation for new power wheelchair.  Also present was Lorre Munroe, sister & Luz Brazen, [employer] to discuss recommendations and wheelchair options.  Further paperwork was completed and sent to vendor.  Patient appears to qualify for power mobility device at this time per objective findings.   MEDICAL HISTORY: Diagnosis: Primary Diagnosis: MS, Spastic Paraplegia  Onset: Jan 2012 diagnosed with excerbations latest March 2017 Diagnosis: Chronic cervical and lumbar spinal stenosis, HTN, gout primarily in left ankle/foot, chronic venous insufficiency, obesity, blind right eye   '[x]' Progressive Disease Relevant past and future surgeries: 2013 cervical surgery & May 2014 back surgery   Height: 5'4" Weight: 270# Explain recent changes or trends in weight: predisone increasing weight ~10# over last year   History including Falls: Her last fall was 1 week ago. She reports falls ~1x/wk with balance losses. Denies injuries.     HOME ENVIRONMENT: '[x]' House  '[]' Condo/town home  '[]' Apartment  '[]' Assisted Living    '[x]' Lives Alone '[]'  Lives with Others  Hours with caregiver: Sister comes over 6-10hrs to care  '[x]' Home is accessible to patient           Stairs      '[x]' Yes '[]'  No     Ramp '[]' Yes '[]' No Comments:  hydraulic lift to eliminate need for 7 steps. Her house is single level and all doors are w/c accessible.    COMMUNITY ADL: TRANSPORTATION: '[x]' Car    '[]' Van    '[]' Public Transportation    '[]' Adapted w/c Lift    '[]' Ambulance    '[]' Other:       '[]' Sits in wheelchair during transport  Employment/School: ????? Specific  requirements pertaining to mobility ?????  Other: Her car is SUV. Stand-pivot transfer. Trying to use a custom built step to increase height, she injured her left foot during transfer to PT.     FUNCTIONAL/SENSORY PROCESSING SKILLS:  Handedness:   '[]' Right     '[x]' Left    '[]' NA  Comments:  ?????  Functional Processing Skills for Wheeled Mobility '[x]' Processing Skills are adequate for safe wheelchair operation  Areas of concern than may interfere with safe operation of wheelchair Description of problem   '[]'  Attention to environment      '[]' Judgment      '[]'  Hearing  '[x]'  Vision or visual processing      '[]' Motor Planning  '[]'  Fluctuations in Behavior  legal blind right LE    VERBAL COMMUNICATION: '[x]' WFL receptive '[x]'  WFL expressive '[x]' Understandable  '[]' Difficult to understand  '[]' non-communicative '[]'  Uses an augmented communication device  CURRENT SEATING / MOBILITY: Current Mobility Base:  '[]' None '[]' Dependent '[x]' Manual '[]' Scooter '[]' Power  Type of Control: ?????  Manufacturer:  Primary school teacher 3Size:  18" X 18"Age: ~1 year privately purchased  Current Condition of Mobility Base:  fair, legrests at home, seat in hammocking, back is worn. Seat depth & width are not adequate for her size   Current Wheelchair components:  flip back armrests, brake extension  Describe posture in present seating system:  rounded shoulders, head forward, sacral sitting, pt reports left leg longer than right (MS issue)      SENSATION and SKIN ISSUES: Sensation '[x]' Intact  '[]' Impaired '[]' Absent  Level of sensation: ????? Pressure Relief: Able to perform effective pressure relief :    '[]' Yes  '[x]'  No Method: attempts to stand which causes fall issues with fatigue If not, Why?: limited standing tolerance, UEs not strong enough to lift weight  Skin Issues/Skin Integrity Current Skin Issues  '[x]' Yes '[]' No '[]' Intact '[x]'  Red area'[]'  Open Area  '[]' Scar Tissue '[]' At risk from prolonged sitting Where  severe cellulitis bil. LEs below knees   History of Skin Issues  '[]' Yes '[x]' No Where  ????? When  ?????  Hx of skin flap surgeries  '[]' Yes '[x]' No Where  ????? When  ?????  Limited sitting tolerance '[x]' Yes '[]' No Hours spent sitting in wheelchair daily: She has back pain at ~1hr. If home transfers to lift chair or bed. She can tolerate up to 4hrs but pain increases significantly.   Complaint of Pain:  Please describe: Sharp, spasms, aching, burning pain from waist down constantly. In last week pain, best 4/10 & worst 10/10. LE spasms in thighs & calves.     Swelling/Edema: pitting edema knee distally with severe cellulitis   ADL STATUS (in reference to wheelchair use):  Indep Assist Unable Indep with Equip Not assessed Comments  Dressing ????? ????? ????? X ????? Sitting in w/c or on bed using reacher. Unable to donne shoes. Socks with sock aide.   Eating X ????? ????? ????? ?????  sits in w/c. Difficulty cutting meat.  Toileting ????? ????? ????? X ????? Raised toilet seat & grab bars. Has to be very cautious with transfer.   Bathing ????? X ????? ????? ????? Shower bench & grab bars with sister's assist  Grooming/Hygiene X ????? ????? ????? ????? sits in w/c  Meal Prep ????? X ????? X ????? Seated in w/c simple meals sandwiches & microwave. Seated ht impedes stove cooking. Family helps.   IADLS X ????? ????? ????? ????? ?????  Bowel Management: '[x]' Continent  '[]' Incontinent  '[x]' Accidents Comments:  Uses Depends.   Bladder Management: '[]' Continent  '[x]' Incontinent  '[x]' Accidents Comments:  Uses Depends.     WHEELCHAIR SKILLS: Manual w/c Propulsion: '[]' UE or LE strength and endurance sufficient to participate in ADLs using manual wheelchair Arm : '[]' left '[]' right   '[]' Both      Distance: ????? Foot:  '[]' left '[]' right   '[]' Both  Operate Scooter: '[]'  Strength, hand grip, balance and transfer appropriate for use '[]' Living environment is accessible for use of scooter  Operate Power w/c:  '[x]'  Std. Joystick   '[]'  Alternative Controls Indep '[x]'   Assist '[]'  Dependent/unable '[]'  N/A '[]'   '[]' Safe          '[]'  Functional      Distance: ?????  Bed confined without wheelchair '[x]'  Yes '[]'  No   STRENGTH/RANGE OF MOTION:  Passive Range of Motion Strength  Shoulder Flexion ~100* & abduction 80* bilaterally single muscle contraction 5/5 but fatigues with MS  Elbow WFL single muscle contraction 5/5 but fatigues with MS  Wrist/Hand WFL fair grip fatigues with MS  Hip flexion ~90* bilaterally limited by obesity flexion 2-/5 (uses leg lifter to move feet on/off legrests)  Knee flexion ~90* edema limiting 2-/5  Ankle ankle dorsiflexion right -4* & left -9* 0/5     MOBILITY/BALANCE:  '[x]'  Patient is totally dependent for mobility  ?????    Balance Transfers Ambulation  Sitting Balance: Standing Balance: '[]'  Independent '[]'  Independent/Modified Independent  '[x]'  WFL     '[]'  WFL '[x]'  Supervision '[]'  Supervision  '[x]'  Uses UE for balance  '[]'  Supervision '[]'  Min Assist '[]'  Ambulates with Assist  ?????    '[]'  Min Assist '[]'  Min assist '[]'  Mod Assist '[]'  Ambulates with Device:      '[]'  RW  '[]'  StW  '[]'  Cane  '[]'  ?????  '[]'  Mod Assist '[]'  Mod assist '[]'  Max assist   '[]'  Max Assist '[]'  Max assist '[]'  Dependent '[]'  Indep. Short Distance Only  '[]'  Unable '[x]'  Unable '[]'  Lift / Sling Required Distance (in feet)  ?????   '[]'  Sliding board '[x]'  Unable to Ambulate (see explanation below)  Cardio Status:  '[x]' Intact  '[]'  Impaired   '[]'  NA     ?????  Respiratory Status:  '[x]' Intact   '[]' Impaired   '[]' NA     ?????  Orthotics/Prosthetics: none  Comments (Address manual vs power w/c vs scooter): Stands with bil. UE support leaning on sturdy object. Transfers pulling on rails pivoting. Unable to advance LEs in standing. Very high fall risk with all standing & unable to ambulate even with walker (BUE support).          Anterior / Posterior Obliquity Rotation-Pelvis ?????  PELVIS    '[]'  '[x]'  '[]'   Neutral Posterior Anterior  '[]'  '[x]'  '[]'   WFL Rt elev Lt elev  '[x]'  '[]'  '[]'   WFL Right Left  Anterior    Anterior     '[]'  Fixed '[]'  Other '[x]'  Partly Flexible '[]'  Flexible   '[]'  Fixed '[]'  Other '[x]'  Partly Flexible  '[]'  Flexible  '[]'  Fixed '[]'  Other '[x]'  Partly Flexible  '[]'  Flexible   TRUNK  '[]'  '[x]'  '[x]'   WFL ? Thoracic ? Lumbar  Kyphosis Lordosis  '[x]'  '[]'  '[]'   WFL Convex Convex  Right Left '[]' c-curve '[]' s-curve '[]' multiple  '[x]'  Neutral '[]'  Left-anterior '[]'  Right-anterior     '[]'  Fixed '[]'  Flexible '[x]'  Partly Flexible '[]'  Other  '[]'  Fixed '[]'  Flexible '[]'  Partly Flexible '[]'  Other  '[]'  Fixed             '[x]'  Flexible '[]'  Partly Flexible '[]'  Other    Position Windswept  ?????  HIPS          '[]'            '[x]'               '[]'    Neutral       Abduct        ADduct         '[]'           '[]'            '[]'   Neutral Right           Left      '[]'  Fixed '[]'  Subluxed '[x]'  Partly Flexible '[]'  Dislocated '[]'  Flexible  '[]'  Fixed '[]'  Other '[]'  Partly Flexible  '[]'  Flexible                 Foot Positioning Knee Positioning  ?????    '[]'  WFL  '[]' Lt '[]' Rt '[x]'  WFL  '[]' Lt '[]' Rt    KNEES ROM concerns: ROM concerns:    & Dorsi-Flexed '[]' Lt '[]' Rt ?????    FEET Plantar Flexed '[x]' Lt '[x]' Rt      Inversion                 '[]' Lt '[]' Rt      Eversion                 '[]' Lt '[]' Rt     HEAD '[x]'  Functional '[x]'  Good Head Control  head forward posture  & '[]'  Flexed         '[]'  Extended '[]'  Adequate Head Control    NECK '[]'  Rotated  Lt  '[]'  Lat Flexed Lt '[]'  Rotated  Rt '[]'  Lat Flexed Rt '[]'  Limited Head Control     '[]'  Cervical Hyperextension '[]'  Absent  Head Control     SHOULDERS ELBOWS WRIST& HAND ?????      Left     Right    Left     Right    Left     Right   U/E '[x]' Functional           '[x]' Functional ????? ????? '[]' Fisting             '[]' Fisting      '[]' elev   '[]' dep      '[]' elev   '[]' dep       '[]' pro -'[]' retract     '[]' pro  '[]' retract '[]' subluxed             '[]' subluxed           Goals for Wheelchair Mobility  '[x]'  Independence with mobility in the home with motor related ADLs (MRADLs)  '[x]'  Independence with MRADLs in the community '[x]'  Provide  dependent mobility  '[x]'  Provide recline     '[x]' Provide tilt  Goals for Seating system '[x]'  Optimize pressure distribution '[x]'  Provide support needed to facilitate function or safety '[x]'  Provide corrective forces to assist with maintaining or improving posture '[x]'  Accommodate client's posture:   current seated postures and positions are not flexible or will not tolerate corrective forces '[x]'  Client to be independent with relieving pressure in the wheelchair '[x]' Enhance physiological function such as breathing, swallowing, digestion  Simulation ideas/Equipment trials:demo to house including footrests mounts State why other equipment was unsuccessful:?????   MOBILITY BASE RECOMMENDATIONS and JUSTIFICATION: MOBILITY COMPONENT JUSTIFICATION  Manufacturer: QuantumModel: Q6 edge 2.0   Size: Width 20"Seat Depth 20" '[x]' provide transport from point A to B      '[x]' promote Indep mobility  '[x]' is not a safe, functional ambulator '[x]' walker or cane inadequate '[]' non-standard width/depth necessary to accommodate anatomical measurement '[]'  ?????  '[]' Manual Mobility Base '[]' non-functional ambulator    '[]' Scooter/POV  '[]' can safely operate  '[]' can safely transfer   '[]' has adequate trunk stability  '[]' cannot functionally propel manual w/c  '[x]' Power Mobility Base  '[x]' non-ambulatory  '[x]' cannot functionally propel manual wheelchair  '[x]'  cannot functionally and safely operate scooter/POV '[x]' can safely operate and willing to  '[]' Stroller Base '[]' infant/child  '[]' unable to propel manual wheelchair '[]' allows for growth '[]' non-functional ambulator '[]' non-functional UE '[]' Indep mobility is not a goal at this time  '[x]' Tilt  '[]' Forward '[x]' Backward '[x]' Powered tilt  '[]' Manual tilt  '[x]' change position against gravitational force on head and shoulders  '[x]' change position for pressure relief/cannot weight shift '[x]' transfers  '[]' management of tone '[x]' rest periods '[x]' control edema '[x]' facilitate postural control  '[]'  ?????   '[x]' Recline  '[x]' Power recline on power base '[]' Manual recline on manual base  '[]' accommodate femur to back angle  '[x]' bring to full recline for ADL care  '[x]' change position for pressure relief/cannot weight shift '[x]' rest periods '[x]' repositioning for transfers or clothing/diaper /catheter changes '[]' head positioning  '[]' Lighter weight required '[]' self- propulsion  '[]' lifting '[]'  ?????  '[]' Heavy Duty required '[]' user weight greater than 250# '[]' extreme tone/ over active movement '[]' broken frame on previous chair '[]'  ?????  '[x]'  Back  '[]'  Angle Adjustable '[x]'  Custom molded Sport '[]' postural control '[]' control of tone/spasticity '[]' accommodation of range of motion '[]' UE functional control '[x]' accommodation for seating system '[]'  ????? '[]' provide lateral trunk support '[]' accommodate deformity '[x]' provide posterior trunk support '[x]' provide lumbar/sacral support '[x]' support trunk in midline '[x]' Pressure relief over spinal processes  '[x]'  [place] M2 Wedge with incontinence liner '[]' impaired sensation  '[]' decubitus ulcers present '[]' history of pressure ulceration '[]' prevent pelvic extension '[x]' low maintenance  '[x]' stabilize pelvis  '[x]' accommodate obliquity '[]' accommodate multiple deformity '[x]' neutralize lower extremity position '[x]' increase pressure distribution '[]'  ?????  '[x]'  Pelvic/thigh support  '[x]'  Lateral thigh guide '[]'  Distal medial pad  '[]'  Distal lateral pad '[]'  pelvis in neutral '[]' accommodate pelvis '[x]'  position upper legs '[x]'  alignment '[]'  accommodate ROM '[]'  decr adduction '[]' accommodate tone '[x]' removable for transfers '[x]' decr abduction  '[]'  Lateral trunk Supports '[]'  Lt     '[]'  Rt '[]' decrease lateral trunk leaning '[]' control tone '[]' contour for increased contact '[]' safety  '[]' accommodate asymmetry '[]'  ?????  '[x]'  Mounting hardware  '[]' lateral trunk supports  '[x]' back   '[x]' seat '[x]' headrest      '[x]'  thigh support '[]' fixed   '[x]' swing away '[x]' attach seat platform/cushion to w/c frame '[x]' attach  back cushion to w/c frame '[x]' mount postural supports '[x]' mount headrest  '[]' swing medial thigh support away '[x]' swing lateral supports away for transfers  '[]'  ?????    Armrests  '[]' fixed '[]' adjustable height '[]' removable   '[]' swing away  '[x]' flip back   '[]' reclining '[x]' full length pads '[]' desk    '[]' pads tubular  [  x]provide support with elbow at 90   '[]' provide support for w/c tray '[x]' change of height/angles for variable activities '[x]' remove for transfers '[x]' allow to come closer to table top '[x]' remove for access to tables '[]'  ?????  Hangers/ Leg rests  '[]' 60 '[]' 70 '[]' 90 '[x]' elevating '[]' heavy duty  '[x]' articulating '[]' fixed '[]' lift off '[]' swing away     '[x]' power '[x]' provide LE support  '[x]' accommodate to hamstring tightness '[x]' elevate legs during recline   '[x]' provide change in position for Legs '[x]' Maintain placement of feet on footplate '[]' durability '[x]' enable transfers '[x]' decrease edema '[]' Accommodate lower leg length '[]'  ?????  Foot support Footplate    '[]' Lt  '[]'  Rt  '[x]'  Center mount '[x]' flip up     '[x]' depth/angle adjustable '[]' Amputee adapter    '[]'  Lt     '[]'  Rt '[x]' provide foot support '[]' accommodate to ankle ROM '[x]' transfers '[x]' Provide support for residual extremity '[x]'  allow foot to go under wheelchair base '[]'  decrease tone  '[]'  ?????  '[]'  Ankle strap/heel loops '[]' support foot on foot support '[]' decrease extraneous movement '[]' provide input to heel  '[]' protect foot  Tires: '[]' pneumatic  '[x]' flat free inserts  '[]' solid  '[x]' decrease maintenance  '[x]' prevent frequent flats '[]' increase shock absorbency '[]' decrease pain from road shock '[]' decrease spasms from road shock '[]'  ?????  '[x]'  Headrest  '[x]' provide posterior head support '[]' provide posterior neck support '[]' provide lateral head support '[]' provide anterior head support '[x]' support during tilt and recline '[]' improve feeding   '[]' improve respiration '[]' placement of switches '[x]' safety  '[]' accommodate ROM  '[]' accommodate tone '[x]' improve visual orientation  '[]'   Anterior chest strap '[]'  Vest '[]'  Shoulder retractors  '[]' decrease forward movement of shoulder '[]' accommodation of TLSO '[]' decrease forward movement of trunk '[]' decrease shoulder elevation '[]' added abdominal support '[]' alignment '[]' assistance with shoulder control  '[]'  ?????  Pelvic Positioner '[x]' Belt '[]' SubASIS bar '[]' Dual Pull '[x]' stabilize tone '[x]' decrease falling out of chair/ **will not Decr potential for sliding due to pelvic tilting '[]' prevent excessive rotation '[]' pad for protection over boney prominence '[]' prominence comfort '[]' special pull angle to control rotation '[]'  ?????  Upper Extremity Support '[]' L   '[]'  R '[]' Arm trough    '[]' hand support '[]'  tray       '[]' full tray '[]' swivel mount '[]' decrease edema      '[]' decrease subluxation   '[]' control tone   '[]' placement for AAC/Computer/EADL '[]' decrease gravitational pull on shoulders '[]' provide midline positioning '[]' provide support to increase UE function '[]' provide hand support in natural position '[]' provide work surface   POWER WHEELCHAIR CONTROLS  '[x]' Proportional  '[]' Non-Proportional Type joystick '[x]' Left  '[]' Right '[x]' provides access for controlling wheelchair   '[]' lacks motor control to operate proportional drive control '[]' unable to understand proportional controls  Actuator Control Module  '[]' Single  '[x]' Multiple   '[x]' Allow the client to operate the power seat function(s) through the joystick control   '[]' Safety Reset Switches '[]' Used to change modes and stop the wheelchair when driving in latch mode    '[x]' Upgraded Electronics   '[x]' programming for accurate control '[x]' progressive Disease/changing condition '[]' non-proportional drive control needed '[x]' Needed in order to operate power seat functions through joystick control   '[]' Display box '[]' Allows user to see in which mode and drive the wheelchair is set  '[]' necessary for alternate controls    '[]' Digital interface electronics '[]' Allows w/c to operate when using alternative drive controls  '[]' ASL Head  Array '[]' Allows client to operate wheelchair  through switches placed in tri-panel headrest  '[]' Sip and puff with tubing kit '[]' needed to operate sip and puff drive controls  '[]' Upgraded tracking electronics '[]' increase safety when driving '[]' correct tracking when on uneven surfaces  '[x]' Mount for switches or joystick '[x]' Attaches  switches to w/c  '[x]' Swing away for access or transfers '[]' midline for optimal placement '[]' provides for consistent access  '[]' Attendant controlled joystick plus mount '[]' safety '[]' long distance driving '[]' operation of seat functions '[]' compliance with transportation regulations '[]'  ?????    Rear wheel placement/Axle adjustability '[]' None '[]' semi adjustable '[]' fully adjustable  '[]' improved UE access to wheels '[]' improved stability '[]' changing angle in space for improvement of postural stability '[]' 1-arm drive access '[]' amputee pad placement '[]'  ?????  Wheel rims/ hand rims  '[]' metal  '[]' plastic coated '[]' oblique projections '[]' vertical projections '[]' Provide ability to propel manual wheelchair  '[]'  Increase self-propulsion with hand weakness/decreased grasp  Push handles '[]' extended  '[]' angle adjustable  '[]' standard '[]' caregiver access '[]' caregiver assist '[]' allows "hooking" to enable increased ability to perform ADLs or maintain balance  One armed device  '[]' Lt   '[]' Rt '[]' enable propulsion of manual wheelchair with one arm   '[]'  ?????   Brake/wheel lock extension '[]'  Lt   '[]'  Rt '[]' increase indep in applying wheel locks   '[]' Side guards '[]' prevent clothing getting caught in wheel or becoming soiled '[]'  prevent skin tears/abrasions  Battery: NF 22 X 2 '[x]' to power wheelchair ?????  Other: ????? ????? ?????  The above equipment has a life- long use expectancy. Growth and changes in medical and/or functional conditions would be the exceptions. This is to certify that the therapist has no financial relationship with durable medical provider or manufacturer. The therapist will not receive remuneration of any  kind for the equipment recommended in this evaluation.   Patient has mobility limitation that significantly impairs safe, timely participation in one or more mobility related ADL's.  (bathing, toileting, feeding, dressing, grooming, moving from room to room)                                                             '[x]'  Yes '[]'  No Will mobility device sufficiently improve ability to participate and/or be aided in participation of MRADL's?         '[x]'  Yes '[]'  No Can limitation be compensated for with use of a cane or walker?                                                                                '[]'  Yes '[x]'  No Does patient or caregiver demonstrate ability/potential ability & willingness to safely use the mobility device?   '[x]'  Yes '[]'  No Does patient's home environment support use of recommended mobility device?                                                    '[x]'  Yes '[]'  No Does patient have sufficient upper extremity function necessary to functionally propel a manual wheelchair?    '[]'  Yes '[x]'  No Does patient have sufficient strength and trunk stability to safely operate a POV (scooter)?                                  '[]'   Yes '[x]'  No Does patient need additional features/benefits provided by a power wheelchair for MRADL's in the home?       '[x]'  Yes '[]'  No Does the patient demonstrate the ability to safely use a power wheelchair?                                                              '[x]'  Yes '[]'  No  Therapist Name Printed: Jamey Reas, PT, DPT Date: December 21, 2015  Therapist's Signature:   Date:   Supplier's Name Printed: [employer] Date: 12/21/15  Supplier's Signature:   Date:  Patient/Caregiver Signature:   Date:     This is to certify that I have read this evaluation and do agree with the content within:    Physician's Name Printed: Madison Hickman, MD  96 Signature:  Date:     This is to certify that I, the above signed therapist have the following  affiliations: '[]'  This DME provider '[]'  Manufacturer of recommended equipment '[]'  Patient's long term care facility '[x]'  None of the above                                       Plan - December 21, 2015 1710    Clinical Impression Statement See PT note for w/c details.    PT Frequency One time visit   PT Next Visit Plan power w/c eval only   Consulted and Agree with Plan of Care Patient;Family member/caregiver   Family Member Consulted sister      Patient will benefit from skilled therapeutic intervention in order to improve the following deficits and impairments:  Decreased activity tolerance, Postural dysfunction  Visit Diagnosis: Muscle weakness (generalized)  Impaired transfers      G-Codes - Dec 21, 2015 1712    Functional Assessment Tool Used Non-ambulatory. Transfers with supervision with heavy dependency on UEs   Functional Limitation Mobility: Walking and moving around   Mobility: Walking and Moving Around Current Status [phone]) At least 80 percent but less than 100 percent impaired, limited or restricted   Mobility: Walking and Moving Around Goal Status [phone]) At least 80 percent but less than 100 percent impaired, limited or restricted   Mobility: Walking and Moving Around Discharge Status [phone]) At least 80 percent but less than 100 percent impaired, limited or restricted       Problem List Patient Active Problem List   Diagnosis Date Noted  . Cellulitis and abscess of leg 08/28/2015  . MS (multiple sclerosis) ([place])   . Weakness of both lower extremities   . Absence of bladder continence   . Essential hypertension   . Spastic paraplegia ([place])   . Weakness 07/12/2015  . Need for hepatitis C screening test 02/09/2015  . Screening for HIV without presence of risk factors 02/09/2015  . Encounter for chronic pain management 04/21/2014  . Left hip pain 11/09/2013  . Right hip pain 06/09/2013  . Chronic venous insufficiency 11/05/2012  . Ankle  pain 05/18/2012  . Spinal stenosis in cervical region 06/03/2011  . Multiple sclerosis ([place]) 12/07/2010  . 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 Spinal stenosis of lumbar region 12/07/2010  . Hypertension 12/07/2010  . Obesity 12/07/2010  . Chronic gout 12/07/2010  .  Breast cancer screening 12/07/2010    Jamey Reas PT, DPT 12/05/2015, 5:14 PM  [facility] [address] [place], [place], [address] Phone: [phone]   Fax:  [phone]  Name: Barbara [name] MRN: [id number] Date of Birth: 1956/04/17

## 2015-12-05 NOTE — Progress Notes (Signed)
Patient injured her left foot prior to coming ot office. Her sister stated her foot got stuck between two hard surfaces. She wrapped gauze around it and placed a sock over patient's foot. There is very small amount of blood coming from her foot, placed foot on towel. Advised that she see her PCP or go to urgent care to get foot examined and treated today.  She agreed. She will also discuss with Dr Marjory Lies.

## 2015-12-06 ENCOUNTER — Telehealth: Payer: Self-pay | Admitting: *Deleted

## 2015-12-06 NOTE — Telephone Encounter (Signed)
Patient is calling back stating she does not have transportation to have her labs done today. She will call back when she has a ride.

## 2015-12-06 NOTE — Telephone Encounter (Signed)
LVM requesting patient come back in today and have her labs drawn. Gave her lab hours, left name, number.

## 2015-12-14 ENCOUNTER — Other Ambulatory Visit: Payer: Self-pay | Admitting: Family Medicine

## 2015-12-14 MED FILL — METHOCARBAMOL 750 MG TABLET: 750 | 90 days supply | Qty: 540 | Fill #0

## 2015-12-15 MED FILL — diazePAM 5 MG TABS: 5 | 22 days supply | Qty: 90 | Fill #1

## 2016-01-02 MED FILL — OXYBUTYNIN CL ER 5 MG TAB: 5 | 90 days supply | Qty: 90 | Fill #0

## 2016-01-03 MED FILL — predniSONE 20 MG TABS: 20 | 15 days supply | Qty: 45 | Fill #2

## 2016-01-17 NOTE — Telephone Encounter (Signed)
Spoke with patient re: labs she did not come back in to have drawn. She stated she wants to think further about changing MS medication. She stated that if any of the medications Dr Danae Orleans suggested are more expensive than what she is currently taking, she cannot afford to change. She stated that if another medication can make her more mobile, she may consider it. She then stated she has had an evaluation for a power wheelchair and will owe several thousand dollars even after Medicare pays their portion. She does not want to come in for labs at this time. We then scheduled her 6 month FU. Advised she call if she has any needs, concerns before that time. She verbalized understanding, appreciation of call.

## 2016-01-22 ENCOUNTER — Telehealth: Payer: Self-pay | Admitting: *Deleted

## 2016-01-22 DIAGNOSIS — L02419 Cutaneous abscess of limb, unspecified: Secondary | ICD-10-CM

## 2016-01-22 DIAGNOSIS — L03119 Cellulitis of unspecified part of limb: Principal | ICD-10-CM

## 2016-01-22 MED ORDER — DOXYCYCLINE HYCLATE 100 MG PO TABS: 100.0000 mg | ORAL_TABLET | Freq: Two times a day (BID) | ORAL | 0 refills | Status: DC

## 2016-01-22 MED FILL — DOXYCYCLINE HYCLATE 100 MG: 100 | 10 days supply | Qty: 20 | Fill #0

## 2016-01-22 NOTE — Telephone Encounter (Signed)
Patient called and would like to speak with [name] regarding her possible cellulitis.  States that she was told to contact provider directly if she felt like she needed an antibiotic. Jazmin Hartsell,CMA

## 2016-01-24 ENCOUNTER — Other Ambulatory Visit: Payer: Self-pay | Admitting: Family Medicine

## 2016-01-26 MED FILL — traMADol HCL 50 MG TABS: 50 | 90 days supply | Qty: 360 | Fill #0

## 2016-02-07 MED FILL — LISINOPRIL 10 MG TABLET: 10 | 90 days supply | Qty: 90 | Fill #1

## 2016-02-07 MED FILL — GABAPENTIN 300 MG CAPSULE: 300 | 90 days supply | Qty: 270 | Fill #3

## 2016-02-15 ENCOUNTER — Other Ambulatory Visit: Payer: Self-pay | Admitting: Family Medicine

## 2016-02-15 DIAGNOSIS — I10 Essential (primary) hypertension: Secondary | ICD-10-CM

## 2016-02-15 MED FILL — predniSONE 20 MG TABS: 20 | 15 days supply | Qty: 45 | Fill #3

## 2016-02-15 MED FILL — FUROSEMIDE 20 MG TABLET: 20 | 90 days supply | Qty: 90 | Fill #0

## 2016-02-23 ENCOUNTER — Encounter: Payer: Self-pay | Admitting: Family Medicine

## 2016-03-06 ENCOUNTER — Ambulatory Visit (INDEPENDENT_AMBULATORY_CARE_PROVIDER_SITE_OTHER): Payer: PPO | Admitting: Family Medicine

## 2016-03-06 ENCOUNTER — Telehealth: Payer: Self-pay | Admitting: Internal Medicine

## 2016-03-06 ENCOUNTER — Ambulatory Visit: Payer: PPO | Admitting: Family Medicine

## 2016-03-06 DIAGNOSIS — Z23 Encounter for immunization: Secondary | ICD-10-CM

## 2016-03-06 DIAGNOSIS — I10 Essential (primary) hypertension: Secondary | ICD-10-CM

## 2016-03-06 DIAGNOSIS — I872 Venous insufficiency (chronic) (peripheral): Secondary | ICD-10-CM | POA: Diagnosis not present

## 2016-03-06 DIAGNOSIS — G35 Multiple sclerosis: Secondary | ICD-10-CM

## 2016-03-06 DIAGNOSIS — Z789 Other specified health status: Secondary | ICD-10-CM

## 2016-03-06 DIAGNOSIS — E119 Type 2 diabetes mellitus without complications: Secondary | ICD-10-CM | POA: Insufficient documentation

## 2016-03-06 DIAGNOSIS — Z7952 Long term (current) use of systemic steroids: Secondary | ICD-10-CM

## 2016-03-06 DIAGNOSIS — Z515 Encounter for palliative care: Secondary | ICD-10-CM

## 2016-03-06 DIAGNOSIS — IMO0002 Reserved for concepts with insufficient information to code with codable children: Secondary | ICD-10-CM

## 2016-03-06 DIAGNOSIS — R739 Hyperglycemia, unspecified: Secondary | ICD-10-CM

## 2016-03-06 DIAGNOSIS — G8220 Paraplegia, unspecified: Secondary | ICD-10-CM

## 2016-03-06 DIAGNOSIS — Z7409 Other reduced mobility: Secondary | ICD-10-CM

## 2016-03-06 HISTORY — DX: Type 2 diabetes mellitus without complications: E11.9

## 2016-03-06 LAB — COMPLETE METABOLIC PANEL WITH GFR
ALT: 37 U/L — AB (ref 6–29)
AST: 37 U/L — ABNORMAL HIGH (ref 10–35)
Albumin: 3.9 g/dL (ref 3.6–5.1)
Alkaline Phosphatase: 89 U/L (ref 33–130)
BUN: 19 mg/dL (ref 7–25)
CALCIUM: 9.7 mg/dL (ref 8.6–10.4)
CHLORIDE: 94 mmol/L — AB (ref 98–110)
CO2: 26 mmol/L (ref 20–31)
Creat: 1.05 mg/dL (ref 0.50–1.05)
GFR, EST AFRICAN AMERICAN: 67 mL/min (ref >=60)
GFR, EST NON AFRICAN AMERICAN: 58 mL/min — AB (ref >=60)
Glucose, Bld: 516 mg/dL (ref 65–99)
POTASSIUM: 4.3 mmol/L (ref 3.5–5.3)
Sodium: 135 mmol/L (ref 135–146)
Total Bilirubin: 0.7 mg/dL (ref 0.2–1.2)
Total Protein: 6.7 g/dL (ref 6.1–8.1)

## 2016-03-06 LAB — POCT GLYCOSYLATED HEMOGLOBIN (HGB A1C): HEMOGLOBIN A1C: 10.3

## 2016-03-06 MED ORDER — PREDNISONE 10 MG PO TABS: 30.0000 mg | ORAL_TABLET | Freq: Every day | ORAL | 3 refills | Status: DC

## 2016-03-06 MED FILL — predniSONE 10 MG TABS: 10 | 90 days supply | Qty: 270 | Fill #0

## 2016-03-06 NOTE — Progress Notes (Signed)
   Subjective:    Patient ID: Barbara [name], female    DOB: 04-02-1956, 60 y.o.   MRN: [id number]  HPI Reason for visit: Face to face eval for power wheel chair is primary reason. I have reviewed and agree with the PT eval done on Dec 05, 2015. Because of her inability to pressure relieve (due to her combined neurologic (MS) and orthopedic (lumbar and cervical osteoarthritis and DDD), she needs the tilt and recline features on her power wheelchair.    Then, it gets much more complicated.  Over 1 years since last seen by me.  Has been seen by neuro, who recommends MS meds which she cannot afford and apparently does not qualify for pharmaceutical indigent fund.  So, she has been taking prednisone 20 mg bid for a year.  This allows her to do her own transfers.  Without it, she is bedridden.  Hospitalized last March and had several elevated BS at the time.  No previous hx of DM but does have + FHx.  Sister accompanied her for this visit.  We discussed end of life decision making due to her heavy burden of chronic disease and my worries about long term steroids.  Not currently taking calcium and vitamin D.  She has had swallowing dysfunction since her last C spine surg.  Not a good candidate for oral bisphosphanates.    Chronic venous insufficiency of legs.  Swelling bad.  Has recurrent cellulitis.    Review of Systems     Objective:   Physical Exam  Cushingnoid faces Lungs clear Cardiac RRR without m or g Abd benign Ext marked bilateral leg edema with some skin breakdown.  No cellulitis. Neuro.  Normal voice, speech and mentation.  Very limited lower ext strength.  Needs help to transfer.  Arrives in wheelchair.       Assessment & Plan:

## 2016-03-06 NOTE — Telephone Encounter (Signed)
**  After Hours/ Emergency Line Call*  Received a call from Robin at [facility] regarding a critical lab value on patient's CMET that was drawn today in clinic. Glucose was 516. All other lab values were relatively normal. Bicarb 26. Anion gap was upper limit of normal at 15. The clinic note from today was not completed, but on the patient instructions, it looks like Pt was told to decrease her Prednisone dose to 30mg  daily. She does not have a previous diagnosis of diabetes, but her A1c drawn today in clinic was 10.3. Chronic steroid use likely contributing.  I called the patient to discuss coming to the ED vs being seen in clinic tomorrow. Pt did not answer her cell phone or home phone. I left a voicemail to call our clinic to be seen ASAP.  Will forward message to PCP.  Hilton Sinclair, MD PGY-2, [facility]

## 2016-03-06 NOTE — Patient Instructions (Addendum)
I will call with the lab results. Cut back the prednisone to 30 mg per day No shingles vaccine because of the daily prednisone Hopefully we have all the needed wheelchair documentation. Remember, you are writing the last chapter of your life.  I hope you get to make choices that make sense to you.  Make sure you do calcium and vitamin D every day, twice a day. I will probably increase your lasix, but I want to see the blood results first. Remind me when I call you to talk about the lasix.

## 2016-03-07 ENCOUNTER — Encounter: Payer: Self-pay | Admitting: Family Medicine

## 2016-03-07 ENCOUNTER — Telehealth: Payer: Self-pay | Admitting: Family Medicine

## 2016-03-07 DIAGNOSIS — Z789 Other specified health status: Secondary | ICD-10-CM | POA: Insufficient documentation

## 2016-03-07 DIAGNOSIS — Z7409 Other reduced mobility: Secondary | ICD-10-CM | POA: Insufficient documentation

## 2016-03-07 DIAGNOSIS — Z515 Encounter for palliative care: Secondary | ICD-10-CM | POA: Insufficient documentation

## 2016-03-07 DIAGNOSIS — Z7952 Long term (current) use of systemic steroids: Secondary | ICD-10-CM

## 2016-03-07 HISTORY — DX: Long term (current) use of systemic steroids: Z79.52

## 2016-03-07 HISTORY — DX: Other reduced mobility: Z74.09

## 2016-03-07 HISTORY — DX: Other specified health status: Z78.9

## 2016-03-07 MED ORDER — METFORMIN HCL 1000 MG PO TABS: ORAL_TABLET | ORAL | 3 refills | Status: DC

## 2016-03-07 MED FILL — metFORMIN HCL 1000 MG TABS: 1000 | 90 days supply | Qty: 180 | Fill #0

## 2016-03-07 NOTE — Assessment & Plan Note (Signed)
Given focus on comfort and independence, continue chronic steroids but try to lower dose.

## 2016-03-07 NOTE — Telephone Encounter (Signed)
Pt was returning Dr. Enriqueta ShutterMayo's call. I informed pt that she needed to make an appointment to come in, but pt stated she is unable to get around and will not be able to come in. Pt would like for Dr. Leveda AnnaHensel to call the home phone. Please advise. Thanks! ep

## 2016-03-07 NOTE — Assessment & Plan Note (Addendum)
New diagnosis.  Informed patient.  Push fluids.  Start metformin .  DM and nutritional management classes.  Lower chronic steroid dose.

## 2016-03-07 NOTE — Assessment & Plan Note (Signed)
At least one thing that is well control.

## 2016-03-07 NOTE — Assessment & Plan Note (Signed)
Marked impaired mobility.  Would benefit from power wheel chair.

## 2016-03-07 NOTE — Assessment & Plan Note (Signed)
Patient cannot afford newer agents.  Will decrease chronic steroids from 40 to 30 mg daily.

## 2016-03-07 NOTE — Assessment & Plan Note (Signed)
Severe.  I do not want to change diuretics with her hyperglycemia.  In fact, as I treat the DM, the swelling may get worse.

## 2016-03-07 NOTE — Assessment & Plan Note (Signed)
Fully support the need for a power wheel chair to help her maintain independence in her ADLs. She is a fall risk and we discussed home safety and the risk of living alone.

## 2016-03-07 NOTE — Telephone Encounter (Signed)
Agree and will address with patient today.

## 2016-03-07 NOTE — Assessment & Plan Note (Signed)
We decided no more pap smears given low risk, difficult to perform and focus on comfort.

## 2016-03-08 ENCOUNTER — Telehealth: Payer: Self-pay | Admitting: Family Medicine

## 2016-03-08 NOTE — Telephone Encounter (Signed)
Debbie from [facility] called and was wondering if the notes from the patient visit on 03/06/16 is complete. If so can we fax this to [phone] att Debbie. If you have question please call her at [phone]. Myriam Jacobson

## 2016-03-08 NOTE — Telephone Encounter (Signed)
I have already spoken with patient.

## 2016-03-11 MED FILL — METHOCARBAMOL 750 MG TABLET: 750 | 90 days supply | Qty: 540 | Fill #1

## 2016-03-11 NOTE — Telephone Encounter (Signed)
Please advise. Sunday Spillers, CMA

## 2016-03-14 NOTE — Telephone Encounter (Signed)
Office note from visit printed and given to Barbara [name] in person today. [name], [name], [place]

## 2016-03-15 ENCOUNTER — Emergency Department (HOSPITAL_COMMUNITY)
Admission: EM | Admit: 2016-03-15 | Discharge: 2016-03-15 | Disposition: A | Discharge status: Home / Self Care | Payer: PPO | Attending: Emergency Medicine | Admitting: Emergency Medicine

## 2016-03-15 ENCOUNTER — Emergency Department (HOSPITAL_COMMUNITY): Payer: PPO

## 2016-03-15 DIAGNOSIS — R197 Diarrhea, unspecified: Secondary | ICD-10-CM | POA: Diagnosis not present

## 2016-03-15 DIAGNOSIS — R112 Nausea with vomiting, unspecified: Secondary | ICD-10-CM | POA: Diagnosis not present

## 2016-03-15 DIAGNOSIS — Z79899 Other long term (current) drug therapy: Secondary | ICD-10-CM | POA: Insufficient documentation

## 2016-03-15 DIAGNOSIS — K2970 Gastritis, unspecified, without bleeding: Secondary | ICD-10-CM | POA: Diagnosis not present

## 2016-03-15 DIAGNOSIS — Z7982 Long term (current) use of aspirin: Secondary | ICD-10-CM | POA: Insufficient documentation

## 2016-03-15 DIAGNOSIS — K8020 Calculus of gallbladder without cholecystitis without obstruction: Secondary | ICD-10-CM | POA: Diagnosis not present

## 2016-03-15 DIAGNOSIS — Z7984 Long term (current) use of oral hypoglycemic drugs: Secondary | ICD-10-CM | POA: Insufficient documentation

## 2016-03-15 DIAGNOSIS — I10 Essential (primary) hypertension: Secondary | ICD-10-CM | POA: Diagnosis not present

## 2016-03-15 DIAGNOSIS — E119 Type 2 diabetes mellitus without complications: Secondary | ICD-10-CM | POA: Insufficient documentation

## 2016-03-15 DIAGNOSIS — K591 Functional diarrhea: Secondary | ICD-10-CM | POA: Diagnosis not present

## 2016-03-15 DIAGNOSIS — R5383 Other fatigue: Secondary | ICD-10-CM | POA: Diagnosis not present

## 2016-03-15 DIAGNOSIS — R1032 Left lower quadrant pain: Secondary | ICD-10-CM | POA: Diagnosis not present

## 2016-03-15 DIAGNOSIS — R531 Weakness: Secondary | ICD-10-CM | POA: Diagnosis not present

## 2016-03-15 LAB — CBG MONITORING, ED: Glucose-Capillary: 294 mg/dL — ABNORMAL HIGH (ref 65–99)

## 2016-03-15 LAB — COMPREHENSIVE METABOLIC PANEL
ALT: 37 U/L (ref 14–54)
AST: 37 U/L (ref 15–41)
Albumin: 3.4 g/dL — ABNORMAL LOW (ref 3.5–5.0)
Alkaline Phosphatase: 66 U/L (ref 38–126)
Anion gap: 12 (ref 5–15)
BUN: 20 mg/dL (ref 6–20)
CHLORIDE: 94 mmol/L — AB (ref 101–111)
CO2: 27 mmol/L (ref 22–32)
Calcium: 9.2 mg/dL (ref 8.9–10.3)
Creatinine, Ser: 1.19 mg/dL — ABNORMAL HIGH (ref 0.44–1.00)
GFR, EST AFRICAN AMERICAN: 57 mL/min — AB (ref >60)
GFR, EST NON AFRICAN AMERICAN: 49 mL/min — AB (ref >60)
Glucose, Bld: 344 mg/dL — ABNORMAL HIGH (ref 65–99)
POTASSIUM: 3.2 mmol/L — AB (ref 3.5–5.1)
SODIUM: 133 mmol/L — AB (ref 135–145)
Total Bilirubin: 1.2 mg/dL (ref 0.3–1.2)
Total Protein: 6.4 g/dL — ABNORMAL LOW (ref 6.5–8.1)

## 2016-03-15 LAB — CBC WITH DIFFERENTIAL/PLATELET
Basophils Absolute: 0.1 10*3/uL (ref 0.0–0.1)
Basophils Relative: 0 %
Eosinophils Absolute: 0.1 10*3/uL (ref 0.0–0.7)
Eosinophils Relative: 1 %
HEMATOCRIT: 42.9 % (ref 36.0–46.0)
HEMOGLOBIN: 14.7 g/dL (ref 12.0–15.0)
LYMPHS ABS: 2 10*3/uL (ref 0.7–4.0)
LYMPHS PCT: 18 %
MCH: 30 pg (ref 26.0–34.0)
MCHC: 34.3 g/dL (ref 30.0–36.0)
MCV: 87.6 fL (ref 78.0–100.0)
Monocytes Absolute: 0.6 10*3/uL (ref 0.1–1.0)
Monocytes Relative: 5 %
NEUTROS PCT: 76 %
Neutro Abs: 8.4 10*3/uL — ABNORMAL HIGH (ref 1.7–7.7)
Platelets: 188 10*3/uL (ref 150–400)
RBC: 4.9 MIL/uL (ref 3.87–5.11)
RDW: 17.5 % — ABNORMAL HIGH (ref 11.5–15.5)
WBC: 11.2 10*3/uL — AB (ref 4.0–10.5)

## 2016-03-15 LAB — LIPASE, BLOOD: Lipase: 31 U/L (ref 11–51)

## 2016-03-15 MED ORDER — SODIUM CHLORIDE 0.9 % IV BOLUS (SEPSIS)
1000.0000 mL | Freq: Once | INTRAVENOUS | Status: AC
  Administered 2016-03-15: 1000 mL via INTRAVENOUS

## 2016-03-15 MED ORDER — KETOROLAC TROMETHAMINE 30 MG/ML IJ SOLN
30.0000 mg | Freq: Once | INTRAMUSCULAR | Status: AC
  Administered 2016-03-15: 30 mg via INTRAVENOUS
  Filled 2016-03-15: qty 1

## 2016-03-15 MED ORDER — METOCLOPRAMIDE HCL 10 MG PO TABS: 10.0000 mg | ORAL_TABLET | Freq: Four times a day (QID) | ORAL | 0 refills | Status: DC | PRN

## 2016-03-15 MED ORDER — IOPAMIDOL (ISOVUE-300) INJECTION 61%
INTRAVENOUS | Status: AC
  Administered 2016-03-15: 100 mL
  Filled 2016-03-15: qty 100

## 2016-03-15 MED ORDER — ONDANSETRON HCL 4 MG/2ML IJ SOLN
4.0000 mg | Freq: Once | INTRAMUSCULAR | Status: AC
  Administered 2016-03-15: 4 mg via INTRAVENOUS
  Filled 2016-03-15: qty 2

## 2016-03-15 MED ORDER — TRAMADOL HCL 50 MG PO TABS
50.0000 mg | ORAL_TABLET | Freq: Once | ORAL | Status: AC
  Administered 2016-03-15: 50 mg via ORAL
  Filled 2016-03-15: qty 1

## 2016-03-15 MED ORDER — DICYCLOMINE HCL 20 MG PO TABS: 20.0000 mg | ORAL_TABLET | Freq: Three times a day (TID) | ORAL | 0 refills | Status: DC | PRN

## 2016-03-15 MED ORDER — METOCLOPRAMIDE HCL 5 MG/ML IJ SOLN
10.0000 mg | Freq: Once | INTRAMUSCULAR | Status: AC
  Administered 2016-03-15: 10 mg via INTRAVENOUS
  Filled 2016-03-15: qty 2

## 2016-03-15 NOTE — Discharge Instructions (Signed)
Read the information below.  Use the prescribed medication as directed.  Please discuss all new medications with your pharmacist.  You may return to the Emergency Department at any time for worsening condition or any new symptoms that concern you.   If you develop high fevers, worsening abdominal pain, uncontrolled vomiting, or are unable to tolerate fluids by mouth, return to the ER for a recheck.  ° °

## 2016-03-15 NOTE — ED Notes (Signed)
Pt. Drank a few sips of ginger ale and unable to drink/eat anymore because nausea. EDP made aware.

## 2016-03-15 NOTE — ED Notes (Addendum)
Pt. Able to eat crackers and sts she has been dry heaving ever since. EDP made aware.

## 2016-03-15 NOTE — ED Provider Notes (Signed)
[facility]-EMERGENCY DEPT Provider Note   CSN: [id number] Arrival date & time: 03/15/16  1056     History   Chief Complaint Chief Complaint  Patient presents with  . Fatigue  . Emesis  . Diarrhea    HPI Barbara [name] is a 60 y.o. female.  HPI   Patient presents with 8 days of vomiting, diarrhea, and abdominal pain.  The pain is constant with occasional sharp pain, increased significantly yesterday, worst in the LLQ.  She has not been able to keep down PO, is now dry heaving.  Excessive diarrhea, episodes too numerous to count, nonbloody.  Developed chills yesterday.  Denies urinary symptoms.  No hx abdominal surgeries.    Past Medical History:  Diagnosis Date  . Gout   . Lower back pain   . Lumbar spondylosis   . MS (multiple sclerosis) ([facility])   . Numbness   . Spinal stenosis   . Weakness     Patient Active Problem List   Diagnosis Date Noted  . Impaired mobility and activities of daily living 03/07/2016  . Palliative care encounter 03/07/2016  . Current chronic use of systemic steroids 03/07/2016  . DM type 2, not at goal [facility]) 03/06/2016  . Cellulitis and abscess of leg 08/28/2015  . Weakness of both lower extremities   . Absence of bladder continence   . Essential hypertension   . Spastic paraplegia ([facility])   . Weakness 07/12/2015  . Encounter for chronic pain management 04/21/2014  . Left hip pain 11/09/2013  . Right hip pain 06/09/2013  . Chronic venous insufficiency 11/05/2012  . Ankle pain 05/18/2012  . Spinal stenosis in cervical region 06/03/2011  . Multiple sclerosis ([facility]) 12/07/2010  . 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 Spinal stenosis of lumbar region 12/07/2010  . Hypertension 12/07/2010  . Obesity 12/07/2010  . Chronic gout 12/07/2010    Past Surgical History:  Procedure Laterality Date  . CERVICAL DISC SURGERY     Qusetions C5-6 C6-7 and C7-T1    OB History    No data available       Home Medications    Prior to Admission medications     Medication Sig Start Date End Date Taking? Authorizing Provider  aspirin EC 325 MG tablet Take 325 mg by mouth daily.   Yes Historical Provider, MD  calcium carbonate (TUMS - DOSED IN MG ELEMENTAL CALCIUM) 500 MG chewable tablet Chew 1 tablet by mouth as needed for indigestion or heartburn.   Yes Historical Provider, MD  calcium-vitamin D (OSCAL WITH D) 500-200 MG-UNIT per tablet Take 1 tablet by mouth daily.     Yes Historical Provider, MD  Cholecalciferol (VITAMIN D3) 1000 units CAPS Take 4,000 Units by mouth daily.   Yes Historical Provider, MD  Coenzyme Q10 (CO Q 10 PO) Take 1 tablet by mouth daily.   Yes Historical Provider, MD  Cyanocobalamin (VITAMIN B-12 PO) Take 1 tablet by mouth daily.   Yes Historical Provider, MD  diazepam (VALIUM) 5 MG tablet TAKE 1 TABLET BY MOUTH EVERY 6 HOURS AS NEEDED FOR LEG SPASMS Patient taking differently: TAKE 5 MG BY MOUTH EVERY 6 HOURS AS NEEDED FOR LEG SPASMS 08/29/15  Yes Moses Manners, MD  furosemide (LASIX) 20 MG tablet TAKE 1 TABLET BY MOUTH ONCE DAILY Patient taking differently: TAKE 20 MG BY MOUTH ONCE DAILY 02/15/16  Yes Moses Manners, MD  gabapentin (NEURONTIN) 300 MG capsule Take 1 capsule (300 mg total) by mouth 3 (three) times daily. 12/05/15  Yes Suanne Marker, MD  lisinopril (PRINIVIL,ZESTRIL) 10 MG tablet TAKE 1 TABLET BY MOUTH ONCE DAILY Patient taking differently: TAKE 20 MG BY MOUTH ONCE DAILY 08/29/15  Yes Moses Manners, MD  Magnesium 250 MG TABS Take 250 mg by mouth daily.   Yes Historical Provider, MD  metFORMIN (GLUCOPHAGE) 1000 MG tablet One half tab two times a day for one week then one full tab twice a day thereafter. Patient taking differently: Take 500-1,000 mg by mouth See admin instructions. Take 500 mg by mouth twice daily for 1 week then take 1000 mg by mouth twice daily 03/07/16  Yes Moses Manners, MD  methocarbamol (ROBAXIN) 750 MG tablet TAKE 2 TABLETS (1,500 MG TOTAL) BY MOUTH 3 (THREE) TIMES DAILY. Patient  taking differently: Take 750 mg by mouth 6 (six) times daily.  12/14/15  Yes Moses Manners, MD  Multiple Vitamins-Minerals (MULTIVITAMIN PO) Take 1 tablet by mouth daily.   Yes Historical Provider, MD  OVER THE COUNTER MEDICATION Take 2 drops by mouth daily. Hemp Extract   Yes Historical Provider, MD  oxybutynin (DITROPAN-XL) 5 MG 24 hr tablet Take 1 tablet (5 mg total) by mouth at bedtime. 12/05/15  Yes Suanne Marker, MD  predniSONE (DELTASONE) 10 MG tablet Take 3 tablets (30 mg total) by mouth daily with breakfast. Patient taking differently: Take 60 mg by mouth daily with breakfast.  03/06/16  Yes Moses Manners, MD  traMADol (ULTRAM) 50 MG tablet TAKE 1 TABLET BY MOUTH 4 TIMES DAILY Patient taking differently: TAKE 50 MG BY MOUTH 4 TIMES DAILY 01/24/16  Yes Moses Manners, MD  trolamine salicylate (ASPERCREME) 10 % cream Apply 1 application topically as needed for muscle pain.   Yes Historical Provider, MD  colchicine 0.6 MG tablet Take 0.6 mg by mouth daily as needed (gout).     Historical Provider, MD  dicyclomine (BENTYL) 20 MG tablet Take 1 tablet (20 mg total) by mouth 3 (three) times daily as needed for spasms (abdominal pain). 03/15/16   Trixie Dredge, PA-C  doxycycline (VIBRA-TABS) 100 MG tablet Take 1 tablet (100 mg total) by mouth 2 (two) times daily. Patient not taking: Reported on 03/15/2016 01/22/16   Moses Manners, MD  metoCLOPramide (REGLAN) 10 MG tablet Take 1 tablet (10 mg total) by mouth every 6 (six) hours as needed for nausea or vomiting. 03/15/16   Trixie Dredge, PA-C  pantoprazole (PROTONIX) 20 MG tablet Take 1 tablet (20 mg total) by mouth daily. Patient not taking: Reported on 03/15/2016 07/19/15   Palma Holter, MD    Family History Family History  Problem Relation Age of Onset  . Heart disease Mother   . Diabetes Mother   . Cancer Father     Social History Social History  Substance Use Topics  . Smoking status: Never Smoker  . Smokeless tobacco:  Never Used  . Alcohol use No     Allergies   Patient has no known allergies.   Review of Systems Review of Systems  All other systems reviewed and are negative.    Physical Exam Updated Vital Signs BP 118/65   Pulse 87   Temp 98.3 F (36.8 C) (Oral)   Resp 15   LMP 10/07/2010   SpO2 97%   Physical Exam  Constitutional: She appears well-developed and well-nourished. No distress.  HENT:  Head: Normocephalic and atraumatic.  Neck: Neck supple.  Cardiovascular: Normal rate and regular rhythm.   Pulmonary/Chest: Effort normal and breath  sounds normal. No respiratory distress. She has no wheezes. She has no rales.  Abdominal: Soft. She exhibits distension. There is generalized tenderness and tenderness in the left upper quadrant and left lower quadrant. There is no rebound and no guarding.  Obese   Neurological: She is alert.  Skin: She is not diaphoretic.  Nursing note and vitals reviewed.    ED Treatments / Results  Labs (all labs ordered are listed, but only abnormal results are displayed) Labs Reviewed  COMPREHENSIVE METABOLIC PANEL - Abnormal; Notable for the following:       Result Value   Sodium 133 (*)    Potassium 3.2 (*)    Chloride 94 (*)    Glucose, Bld 344 (*)    Creatinine, Ser 1.19 (*)    Total Protein 6.4 (*)    Albumin 3.4 (*)    GFR calc non Af Amer 49 (*)    GFR calc Af Amer 57 (*)    All other components within normal limits  CBC WITH DIFFERENTIAL/PLATELET - Abnormal; Notable for the following:    WBC 11.2 (*)    RDW 17.5 (*)    Neutro Abs 8.4 (*)    All other components within normal limits  CBG MONITORING, ED - Abnormal; Notable for the following:    Glucose-Capillary 294 (*)    All other components within normal limits  LIPASE, BLOOD    EKG  EKG Interpretation None       Radiology Ct Abdomen Pelvis W Contrast  Result Date: 03/15/2016 CLINICAL DATA:  Nausea, vomiting, diarrhea for 1 week EXAM: CT ABDOMEN AND PELVIS WITH  CONTRAST TECHNIQUE: Multidetector CT imaging of the abdomen and pelvis was performed using the standard protocol following bolus administration of intravenous contrast. CONTRAST:  100mL ISOVUE-300 IOPAMIDOL (ISOVUE-300) INJECTION 61% COMPARISON:  11/18/2006 FINDINGS: Lower chest: The lung bases are unremarkable.  Small hiatal hernia. Hepatobiliary: There is fatty infiltration of the liver. No focal hepatic mass. Mild distended gallbladder without evidence of pericholecystic fluid. Noncalcified gallstones are noted within gallbladder the largest measures 7 mm. CBD measures 6 mm in diameter. Pancreas: Enhanced pancreas is unremarkable. Spleen: Enhanced spleen is unremarkable. Adrenals/Urinary Tract: No adrenal gland mass. Enhanced kidneys are symmetrical in size. No hydronephrosis or hydroureter. No nephrolithiasis. No calcified ureteral calculi are noted. Delayed renal images shows bilateral renal symmetrical excretion. Bilateral visualized proximal ureter is unremarkable. A cyst in midpole anterior aspect of the left kidney measures 1.1 cm. Stomach/Bowel: No small bowel obstruction. No thickened or dilated small bowel loops. No pericecal inflammation. Some colonic stool and gas noted in right colon. Some colonic gas noted in transverse colon. No distal colonic obstruction. No evidence of colitis or diverticulitis. Moderate gas noted in mid sigmoid colon. Vascular/Lymphatic: No aortic aneurysm. Mild atherosclerotic calcifications of distal abdominal aorta. No adenopathy. Reproductive: The uterus is anteflexed.  No adnexal mass. Other: No ascites or free abdominal air. No inguinal adenopathy. There are artifacts from patient's large body habitus. Musculoskeletal: No destructive bony lesions are noted. There are degenerative changes thoracolumbar spine. Significant disc space flattening with vacuum disc phenomenon and endplate sclerotic changes at L4-L5 and L5-S1 level. Mild spinal canal stenosis due to posterior  spurring at L3-L4 and L4-L5 level. Facet degenerative changes noted L4 and L5 level. IMPRESSION: 1. There is fatty infiltration of the liver.  No focal hepatic mass. 2. Noncalcified gallstones are noted within gallbladder the largest measures 7 mm. 3. No hydronephrosis or hydroureter. There is a cyst in midpole anterior  aspect of the left kidney measures 1.1 cm. 4. No pericecal inflammation. No small bowel or colonic obstruction. No colitis or diverticulitis. 5. Degenerative changes thoracic spine. Degenerative changes lumbar spine. Electronically Signed   By: Natasha Mead M.D.   On: 03/15/2016 13:07    Procedures Procedures (including critical care time)  Medications Ordered in ED Medications  sodium chloride 0.9 % bolus 1,000 mL (0 mLs Intravenous Stopped 03/15/16 1446)  iopamidol (ISOVUE-300) 61 % injection (100 mLs  Contrast Given 03/15/16 1239)  ondansetron (ZOFRAN) injection 4 mg (4 mg Intravenous Given 03/15/16 1355)  ondansetron (ZOFRAN) injection 4 mg (4 mg Intravenous Given 03/15/16 1507)  traMADol (ULTRAM) tablet 50 mg (50 mg Oral Given 03/15/16 1525)  sodium chloride 0.9 % bolus 1,000 mL (0 mLs Intravenous Stopped 03/15/16 1814)  metoCLOPramide (REGLAN) injection 10 mg (10 mg Intravenous Given 03/15/16 1651)  ketorolac (TORADOL) 30 MG/ML injection 30 mg (30 mg Intravenous Given 03/15/16 1728)     Initial Impression / Assessment and Plan / ED Course  I have reviewed the triage vital signs and the nursing notes.  Pertinent labs & imaging results that were available during my care of the patient were reviewed by me and considered in my medical decision making (see chart for details).  Clinical Course as of Mar 16 2027  Caleen Essex Mar 15, 2016  2007 Discussed pt with Family Medicine Resident.  Appointment being made to see Dr Leveda Anna - they will call patient.    [EW]    Clinical Course User Index [EW] Trixie Dredge, PA-C   Afebrile immunocompromised patient with abdominal pain, vomiting and  diarrhea x 8 days.  Labs reassuring. CT abd/pelvisnegative for acute change.  Doubt gallstones causing this pain as it is left sided.  IVF and antiemetics given with improvement.  Pt able to tolerate PO.  She did not have any vomiting or diarrhea while in the department and therefore c.diff not sent.  Pt reported feeling comfortable with discharge home.  I encouraged close follow up with PCP and later contacted PCP to assist in establishing a follow up appointment.  Discussed return precautions with patient.  Discussed result, findings, treatment, and follow up  with patient.  Pt given return precautions.  Pt verbalizes understanding and agrees with plan.      Final Clinical Impressions(s) / ED Diagnoses   Final diagnoses:  Nausea vomiting and diarrhea  Left lower quadrant pain    New Prescriptions New Prescriptions   DICYCLOMINE (BENTYL) 20 MG TABLET    Take 1 tablet (20 mg total) by mouth 3 (three) times daily as needed for spasms (abdominal pain).   METOCLOPRAMIDE (REGLAN) 10 MG TABLET    Take 1 tablet (10 mg total) by mouth every 6 (six) hours as needed for nausea or vomiting.     Trixie Dredge, PA-C 03/15/16 2028    Melene Plan, DO 03/16/16 1740

## 2016-03-15 NOTE — ED Notes (Signed)
EDP at bedside  

## 2016-03-15 NOTE — ED Triage Notes (Signed)
Pt arrives from home with c/o n/v/d for one week. Pt states she is no unable to perform ADL's and is unable to transfer herself in/out of her wheelchair rt weakness. Pt lives at home alone.

## 2016-03-15 NOTE — ED Notes (Signed)
Patient Alert and oriented X4. Stable and ambulatory to baseline. Patient verbalized understanding of the discharge instructions.  Patient belongings were taken by the patient.  

## 2016-03-18 ENCOUNTER — Other Ambulatory Visit: Payer: Self-pay | Admitting: Family Medicine

## 2016-03-18 NOTE — Telephone Encounter (Signed)
Pt wanted to let [name] know she had to go to the ED with vomiting and diarrhea on the 10th, Pt also [facility], first available was Nov. 30th. Pt would also like [name] to call in a glucose meter, test strips, and lancets. Pt uses [facility]. Please advise. Thanks! ep

## 2016-03-18 NOTE — Telephone Encounter (Signed)
Form completed and placed in "to be faxed" pile

## 2016-03-18 NOTE — Telephone Encounter (Signed)
Call patient's insurance to check which diabetic testing supplies are on her formulary. Patient can use [employer] supplies. Standardized DM form completed for Onetouch Ultra Meter, test strips and ultrasoft lancets.  Form placed in provider box to sign.  Clovis PuMartin, Huston Stonehocker L, RN

## 2016-03-26 ENCOUNTER — Other Ambulatory Visit: Payer: Self-pay | Admitting: Family Medicine

## 2016-03-26 MED FILL — diazePAM 5 MG TABS: 5 | 22 days supply | Qty: 90 | Fill #0

## 2016-03-26 MED FILL — OXYBUTYNIN CL ER 5 MG TAB: 5 | 90 days supply | Qty: 90 | Fill #1

## 2016-03-27 ENCOUNTER — Encounter: Payer: Self-pay | Admitting: Family Medicine

## 2016-03-29 ENCOUNTER — Encounter: Payer: Self-pay | Admitting: Diagnostic Neuroimaging

## 2016-04-01 ENCOUNTER — Inpatient Hospital Stay (HOSPITAL_COMMUNITY)
Admission: EM | Admit: 2016-04-01 | Discharge: 2016-04-05 | DRG: 059 | Disposition: A | Discharge status: Skilled Nursing Facility | Payer: PPO | Attending: Family Medicine | Admitting: Family Medicine

## 2016-04-01 ENCOUNTER — Encounter (HOSPITAL_COMMUNITY): Payer: Self-pay | Admitting: Emergency Medicine

## 2016-04-01 ENCOUNTER — Telehealth: Payer: Self-pay | Admitting: *Deleted

## 2016-04-01 ENCOUNTER — Emergency Department (HOSPITAL_COMMUNITY): Payer: PPO

## 2016-04-01 DIAGNOSIS — L89312 Pressure ulcer of right buttock, stage 2: Secondary | ICD-10-CM | POA: Diagnosis present

## 2016-04-01 DIAGNOSIS — G8929 Other chronic pain: Secondary | ICD-10-CM | POA: Diagnosis present

## 2016-04-01 DIAGNOSIS — I878 Other specified disorders of veins: Secondary | ICD-10-CM | POA: Diagnosis present

## 2016-04-01 DIAGNOSIS — F801 Expressive language disorder: Secondary | ICD-10-CM | POA: Diagnosis not present

## 2016-04-01 DIAGNOSIS — M25511 Pain in right shoulder: Secondary | ICD-10-CM | POA: Diagnosis not present

## 2016-04-01 DIAGNOSIS — Z515 Encounter for palliative care: Secondary | ICD-10-CM | POA: Diagnosis not present

## 2016-04-01 DIAGNOSIS — G8220 Paraplegia, unspecified: Secondary | ICD-10-CM | POA: Diagnosis present

## 2016-04-01 DIAGNOSIS — L89159 Pressure ulcer of sacral region, unspecified stage: Secondary | ICD-10-CM

## 2016-04-01 DIAGNOSIS — R279 Unspecified lack of coordination: Secondary | ICD-10-CM | POA: Diagnosis not present

## 2016-04-01 DIAGNOSIS — G35 Multiple sclerosis: Secondary | ICD-10-CM | POA: Diagnosis not present

## 2016-04-01 DIAGNOSIS — D72829 Elevated white blood cell count, unspecified: Secondary | ICD-10-CM | POA: Diagnosis not present

## 2016-04-01 DIAGNOSIS — Z7409 Other reduced mobility: Secondary | ICD-10-CM | POA: Diagnosis not present

## 2016-04-01 DIAGNOSIS — R0902 Hypoxemia: Secondary | ICD-10-CM | POA: Diagnosis not present

## 2016-04-01 DIAGNOSIS — R29898 Other symptoms and signs involving the musculoskeletal system: Secondary | ICD-10-CM | POA: Diagnosis present

## 2016-04-01 DIAGNOSIS — L89152 Pressure ulcer of sacral region, stage 2: Secondary | ICD-10-CM | POA: Diagnosis present

## 2016-04-01 DIAGNOSIS — R252 Cramp and spasm: Secondary | ICD-10-CM | POA: Diagnosis not present

## 2016-04-01 DIAGNOSIS — R32 Unspecified urinary incontinence: Secondary | ICD-10-CM | POA: Diagnosis not present

## 2016-04-01 DIAGNOSIS — M545 Low back pain: Secondary | ICD-10-CM | POA: Diagnosis not present

## 2016-04-01 DIAGNOSIS — E11622 Type 2 diabetes mellitus with other skin ulcer: Secondary | ICD-10-CM | POA: Diagnosis present

## 2016-04-01 DIAGNOSIS — Z7401 Bed confinement status: Secondary | ICD-10-CM

## 2016-04-01 DIAGNOSIS — Z79899 Other long term (current) drug therapy: Secondary | ICD-10-CM

## 2016-04-01 DIAGNOSIS — L89612 Pressure ulcer of right heel, stage 2: Secondary | ICD-10-CM | POA: Diagnosis present

## 2016-04-01 DIAGNOSIS — S46011A Strain of muscle(s) and tendon(s) of the rotator cuff of right shoulder, initial encounter: Secondary | ICD-10-CM | POA: Diagnosis not present

## 2016-04-01 DIAGNOSIS — I10 Essential (primary) hypertension: Secondary | ICD-10-CM | POA: Diagnosis present

## 2016-04-01 DIAGNOSIS — M4802 Spinal stenosis, cervical region: Secondary | ICD-10-CM | POA: Diagnosis not present

## 2016-04-01 DIAGNOSIS — Z7952 Long term (current) use of systemic steroids: Secondary | ICD-10-CM | POA: Diagnosis not present

## 2016-04-01 DIAGNOSIS — R1313 Dysphagia, pharyngeal phase: Secondary | ICD-10-CM | POA: Diagnosis not present

## 2016-04-01 DIAGNOSIS — T380X5A Adverse effect of glucocorticoids and synthetic analogues, initial encounter: Secondary | ICD-10-CM | POA: Diagnosis present

## 2016-04-01 DIAGNOSIS — Z6841 Body Mass Index (BMI) 40.0 and over, adult: Secondary | ICD-10-CM

## 2016-04-01 DIAGNOSIS — S46009A Unspecified injury of muscle(s) and tendon(s) of the rotator cuff of unspecified shoulder, initial encounter: Secondary | ICD-10-CM | POA: Diagnosis not present

## 2016-04-01 DIAGNOSIS — M75102 Unspecified rotator cuff tear or rupture of left shoulder, not specified as traumatic: Secondary | ICD-10-CM | POA: Diagnosis not present

## 2016-04-01 DIAGNOSIS — M75101 Unspecified rotator cuff tear or rupture of right shoulder, not specified as traumatic: Secondary | ICD-10-CM | POA: Diagnosis not present

## 2016-04-01 DIAGNOSIS — M25519 Pain in unspecified shoulder: Secondary | ICD-10-CM

## 2016-04-01 DIAGNOSIS — L89322 Pressure ulcer of left buttock, stage 2: Secondary | ICD-10-CM | POA: Diagnosis present

## 2016-04-01 DIAGNOSIS — I872 Venous insufficiency (chronic) (peripheral): Secondary | ICD-10-CM | POA: Diagnosis not present

## 2016-04-01 DIAGNOSIS — Z789 Other specified health status: Secondary | ICD-10-CM

## 2016-04-01 DIAGNOSIS — Z66 Do not resuscitate: Secondary | ICD-10-CM | POA: Diagnosis not present

## 2016-04-01 DIAGNOSIS — M6281 Muscle weakness (generalized): Secondary | ICD-10-CM | POA: Diagnosis not present

## 2016-04-01 DIAGNOSIS — E119 Type 2 diabetes mellitus without complications: Secondary | ICD-10-CM

## 2016-04-01 DIAGNOSIS — Z7984 Long term (current) use of oral hypoglycemic drugs: Secondary | ICD-10-CM

## 2016-04-01 DIAGNOSIS — M48061 Spinal stenosis, lumbar region without neurogenic claudication: Secondary | ICD-10-CM | POA: Diagnosis not present

## 2016-04-01 DIAGNOSIS — L97829 Non-pressure chronic ulcer of other part of left lower leg with unspecified severity: Secondary | ICD-10-CM | POA: Diagnosis not present

## 2016-04-01 DIAGNOSIS — R531 Weakness: Secondary | ICD-10-CM | POA: Diagnosis not present

## 2016-04-01 LAB — COMPREHENSIVE METABOLIC PANEL
ALBUMIN: 3.3 g/dL — AB (ref 3.5–5.0)
ALT: 37 U/L (ref 14–54)
AST: 36 U/L (ref 15–41)
Alkaline Phosphatase: 66 U/L (ref 38–126)
Anion gap: 10 (ref 5–15)
BUN: 30 mg/dL — AB (ref 6–20)
CHLORIDE: 100 mmol/L — AB (ref 101–111)
CO2: 26 mmol/L (ref 22–32)
CREATININE: 1.07 mg/dL — AB (ref 0.44–1.00)
Calcium: 9.6 mg/dL (ref 8.9–10.3)
GFR calc Af Amer: >60 mL/min (ref >60)
GFR, EST NON AFRICAN AMERICAN: 56 mL/min — AB (ref >60)
GLUCOSE: 227 mg/dL — AB (ref 65–99)
POTASSIUM: 3.7 mmol/L (ref 3.5–5.1)
Sodium: 136 mmol/L (ref 135–145)
Total Bilirubin: 0.7 mg/dL (ref 0.3–1.2)
Total Protein: 6.8 g/dL (ref 6.5–8.1)

## 2016-04-01 LAB — CBC
HEMATOCRIT: 42.6 % (ref 36.0–46.0)
Hemoglobin: 14.1 g/dL (ref 12.0–15.0)
MCH: 29.6 pg (ref 26.0–34.0)
MCHC: 33.1 g/dL (ref 30.0–36.0)
MCV: 89.5 fL (ref 78.0–100.0)
PLATELETS: 198 10*3/uL (ref 150–400)
RBC: 4.76 MIL/uL (ref 3.87–5.11)
RDW: 17.2 % — AB (ref 11.5–15.5)
WBC: 11.8 10*3/uL — AB (ref 4.0–10.5)

## 2016-04-01 MED ORDER — KETOROLAC TROMETHAMINE 30 MG/ML IJ SOLN
30.0000 mg | Freq: Once | INTRAMUSCULAR | Status: AC
  Administered 2016-04-01: 30 mg via INTRAVENOUS
  Filled 2016-04-01: qty 1

## 2016-04-01 NOTE — Telephone Encounter (Signed)
Pt called outpatient pharmacy and they say they didn't receive the fax for the diabetic supplies.  Please advise

## 2016-04-01 NOTE — ED Notes (Signed)
Pt transported to MRI 

## 2016-04-01 NOTE — Telephone Encounter (Signed)
Unable to reach pt or LVM on home phone. Called mobile #, spoke with patient and advised her that due to her multiple medical issues,  Dr Marjory Lies needs to see her in the clinic to evaluate her before ordering medications.  She stated she cannot afford to come because she would have to come by ambulance. She stated she "already owes $600" from recent ambulance ride and ED visit. She stated her sister was helping her into patient's own car to go to ED, and patient fell. That's when she had to call ambulance.  She stated she will have to think about how she may be able to get transportation. This RN discussed her blood sugar being elevated early Nov and the issue with steroids and blood sugars in a diabetic. She then stated her blood sugar at ED on 03/15/16 was 200, but she does not currently have CBG machine to check it daily. She has contacted her PCP to prescribe one, so her insurance will pay. She stated that she hopes she'll get better and will not need to come in. This RN advised she call back if she can get transportation and wants to be seen. She verbalized understanding, appreciation of call.

## 2016-04-01 NOTE — ED Provider Notes (Signed)
[facility]-EMERGENCY DEPT Provider Note   CSN: [id number] Arrival date & time: 04/01/16  2034     History   Chief Complaint Chief Complaint  Patient presents with  . Back Pain    HPI Barbara [name] is a 60 y.o. female.  The patient is a [age] year old female with history of MS and spastic paraplegia, DDD and spinal stenosis, chronic urinary incontinence, chronic venous insufficiency, HTN, and DM in the setting of chronic steroid use. She presents to the emergency department for decreased mobility. The patient states that she chronically uses a motorized chair; however, she is often able to transition from this chair to other places. She has been unable to do this lately and she has noted a progressive worsening in mobility over the last 3 weeks. The patient attributes this to a recent episode of viral gastroenteritis. The patient reports that she is on chronic 30 mg prednisone daily. She has increased this to 40 mg daily in an effort to improve her mobility. Per primary care notes, without use of daily prednisone, the patient is completely bed bound. The patient states that increasing her prednisone dosage has not improved her ability to transition. She has not had any recent fevers, vision changes, hearing changes, difficulty speaking or swallowing. No chest pain, vomiting, or sensation changes. She does feel more weak in her bilateral lower extremities, stating that when she stands her legs well "crumple underneath me". The patient believes that she requires admission for IV steroids. Her doctor would not allow her to have this performed at home given her new onset of diabetes and concern for elevated blood sugar. As an aside, the patient expresses concern over bed sores which may have developed secondary to her mobility issues. The patient lives with her sister who is her primary caregiver. She is not on any MS medications due to cost and poor insurance coverage.  PCP - Dr. Leveda AnnaHensel Neurology - Dr.  Marjory [facility] - [facility]       Past Medical History:  Diagnosis Date  . Gout   . Lower back pain   . Lumbar spondylosis   . MS (multiple sclerosis) (HCC)   . Numbness   . Spinal stenosis   . Weakness     Patient Active Problem List   Diagnosis Date Noted  . Lower extremity weakness 04/02/2016  . Impaired mobility and activities of daily living 03/07/2016  . Palliative care encounter 03/07/2016  . Current chronic use of systemic steroids 03/07/2016  . DM type 2, not at goal [facility](HCC) 03/06/2016  . Cellulitis and abscess of leg 08/28/2015  . Weakness of both lower extremities   . Absence of bladder continence   . Essential hypertension   . Spastic paraplegia (HCC)   . Weakness 07/12/2015  . Encounter for chronic pain management 04/21/2014  . Left hip pain 11/09/2013  . Right hip pain 06/09/2013  . Chronic venous insufficiency 11/05/2012  . Ankle pain 05/18/2012  . Spinal stenosis in cervical region 06/03/2011  . Multiple sclerosis (HCC) 12/07/2010  . 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 Spinal stenosis of lumbar region 12/07/2010  . Hypertension 12/07/2010  . Obesity 12/07/2010  . Chronic gout 12/07/2010    Past Surgical History:  Procedure Laterality Date  . CERVICAL DISC SURGERY     Qusetions C5-6 C6-7 and C7-T1    OB History    No data available       Home Medications    Prior to Admission medications   Medication Sig Start  Date End Date Taking? Authorizing Provider  aspirin EC 325 MG tablet Take 325 mg by mouth daily.    Historical Provider, MD  calcium carbonate (TUMS - DOSED IN MG ELEMENTAL CALCIUM) 500 MG chewable tablet Chew 1 tablet by mouth as needed for indigestion or heartburn.    Historical Provider, MD  calcium-vitamin D (OSCAL WITH D) 500-200 MG-UNIT per tablet Take 1 tablet by mouth daily.      Historical Provider, MD  Cholecalciferol (VITAMIN D3) 1000 units CAPS Take 4,000 Units by mouth daily.    Historical Provider, MD  Coenzyme Q10 (CO Q  10 PO) Take 1 tablet by mouth daily.    Historical Provider, MD  colchicine 0.6 MG tablet Take 0.6 mg by mouth daily as needed (gout).     Historical Provider, MD  Cyanocobalamin (VITAMIN B-12 PO) Take 1 tablet by mouth daily.    Historical Provider, MD  diazepam (VALIUM) 5 MG tablet TAKE 1 TABLET BY MOUTH EVERY 6 HOURS AS NEEDED FOR LEG SPASMS 03/26/16   Moses Manners, MD  dicyclomine (BENTYL) 20 MG tablet Take 1 tablet (20 mg total) by mouth 3 (three) times daily as needed for spasms (abdominal pain). 03/15/16   Trixie Dredge, PA-C  doxycycline (VIBRA-TABS) 100 MG tablet Take 1 tablet (100 mg total) by mouth 2 (two) times daily. Patient not taking: Reported on 03/15/2016 01/22/16   Moses Manners, MD  furosemide (LASIX) 20 MG tablet TAKE 1 TABLET BY MOUTH ONCE DAILY Patient taking differently: TAKE 20 MG BY MOUTH ONCE DAILY 02/15/16   Moses Manners, MD  gabapentin (NEURONTIN) 300 MG capsule Take 1 capsule (300 mg total) by mouth 3 (three) times daily. 12/05/15   Suanne Marker, MD  lisinopril (PRINIVIL,ZESTRIL) 10 MG tablet TAKE 1 TABLET BY MOUTH ONCE DAILY Patient taking differently: TAKE 20 MG BY MOUTH ONCE DAILY 08/29/15   Moses Manners, MD  Magnesium 250 MG TABS Take 250 mg by mouth daily.    Historical Provider, MD  metFORMIN (GLUCOPHAGE) 1000 MG tablet One half tab two times a day for one week then one full tab twice a day thereafter. Patient taking differently: Take 500-1,000 mg by mouth See admin instructions. Take 500 mg by mouth twice daily for 1 week then take 1000 mg by mouth twice daily 03/07/16   Moses Manners, MD  methocarbamol (ROBAXIN) 750 MG tablet TAKE 2 TABLETS (1,500 MG TOTAL) BY MOUTH 3 (THREE) TIMES DAILY. Patient taking differently: Take 750 mg by mouth 6 (six) times daily.  12/14/15   Moses Manners, MD  metoCLOPramide (REGLAN) 10 MG tablet Take 1 tablet (10 mg total) by mouth every 6 (six) hours as needed for nausea or vomiting. 03/15/16   Trixie Dredge, PA-C    Multiple Vitamins-Minerals (MULTIVITAMIN PO) Take 1 tablet by mouth daily.    Historical Provider, MD  OVER THE COUNTER MEDICATION Take 2 drops by mouth daily. Hemp Extract    Historical Provider, MD  oxybutynin (DITROPAN-XL) 5 MG 24 hr tablet Take 1 tablet (5 mg total) by mouth at bedtime. 12/05/15   Suanne Marker, MD  pantoprazole (PROTONIX) 20 MG tablet Take 1 tablet (20 mg total) by mouth daily. Patient not taking: Reported on 03/15/2016 07/19/15   Palma Holter, MD  predniSONE (DELTASONE) 10 MG tablet Take 3 tablets (30 mg total) by mouth daily with breakfast. Patient taking differently: Take 60 mg by mouth daily with breakfast.  03/06/16   Moses Manners, MD  traMADol (ULTRAM) 50 MG tablet TAKE 1 TABLET BY MOUTH 4 TIMES DAILY Patient taking differently: TAKE 50 MG BY MOUTH 4 TIMES DAILY 01/24/16   Moses Manners, MD  trolamine salicylate (ASPERCREME) 10 % cream Apply 1 application topically as needed for muscle pain.    Historical Provider, MD    Family History Family History  Problem Relation Age of Onset  . Heart disease Mother   . Diabetes Mother   . Cancer Father     Social History Social History  Substance Use Topics  . Smoking status: Never Smoker  . Smokeless tobacco: Never Used  . Alcohol use No     Allergies   Patient has no known allergies.   Review of Systems Review of Systems Ten systems reviewed and are negative for acute change, except as noted in the HPI.    Physical Exam Updated Vital Signs BP 139/87   Pulse 92   Temp 97.8 F (36.6 C) (Oral)   Resp 16   Ht 5\' 3"  (1.6 m)   Wt 122.5 kg   LMP 10/07/2010   SpO2 96%   BMI 47.83 kg/m   Physical Exam  Constitutional: She is oriented to person, place, and time. She appears well-developed and well-nourished. No distress.  Morbidly obese, in NAD. Pleasant.  HENT:  Head: Normocephalic and atraumatic.  Mouth/Throat: Oropharynx is clear and moist.  Eyes: Conjunctivae and EOM are normal.   Neck: Normal range of motion.  Cardiovascular: Normal rate, regular rhythm and intact distal pulses.   Pulmonary/Chest: Effort normal. No respiratory distress. She has no wheezes. She has no rales.  Respirations even and unlabored  Abdominal: Soft.  Genitourinary:     Musculoskeletal:       Back:       Legs: Neurological: She is alert and oriented to person, place, and time. A sensory deficit is present. No cranial nerve deficit. She exhibits abnormal muscle tone.  Decreased sensation to light touch in the RLE compared to left; this is at baseline, per patient. There is 2-3/5 strength against resistance in BLE. Patient states this worse from baseline. Grip strength 5/5 bilaterally. 5/5 strength against resistance in BUE.  Skin: Skin is warm and dry. Capillary refill takes less than 2 seconds. She is not diaphoretic.  Psychiatric: She has a normal mood and affect. Her behavior is normal.  Nursing note and vitals reviewed.    ED Treatments / Results  Labs (all labs ordered are listed, but only abnormal results are displayed) Labs Reviewed  CBC - Abnormal; Notable for the following:       Result Value   WBC 11.8 (*)    RDW 17.2 (*)    All other components within normal limits  COMPREHENSIVE METABOLIC PANEL - Abnormal; Notable for the following:    Chloride 100 (*)    Glucose, Bld 227 (*)    BUN 30 (*)    Creatinine, Ser 1.07 (*)    Albumin 3.3 (*)    GFR calc non Af Amer 56 (*)    All other components within normal limits    EKG  EKG Interpretation None       Radiology Mr Cervical Spine Wo Contrast  Result Date: 04/02/2016 CLINICAL DATA:  Multiple sclerosis with bilateral lower extremity weakness EXAM: MRI CERVICAL, THORACIC AND LUMBAR SPINE WITHOUT CONTRAST TECHNIQUE: Multiplanar and multiecho pulse sequences of the cervical spine, to include the craniocervical junction and cervicothoracic junction, and thoracic and lumbar spine, were obtained without intravenous  contrast. COMPARISON:  Cervical and thoracic spine MRI 07/15/2015, lumbar spine MRI 05/05/2015 FINDINGS: Despite efforts by the technologist and patient, motion artifact is present on today's examination and could not be eliminated. This reduces the sensitivity and specificity of the study. Particularly, the axial images are severely degraded. MRI CERVICAL SPINE FINDINGS Alignment: There is straightening of the normal cervical lordosis, unchanged. Vertebrae: There is anterior cervical fusion at C5-T1. Cord: Lesion within the right posterior aspect of the cord at the C2 level is unchanged. The other previously seen cord lesions cannot be clearly identified due to motion artifact. A lesion within the right aspect of the cord at C7 is unchanged. There is no cord expansion. No large new lesions are seen on the sagittal inversion recovery sequence. Posterior Fossa, vertebral arteries, paraspinal tissues: Visualized posterior fossa is normal. Vertebral artery flow voids are preserved. Normal visualized paraspinal soft tissues. Disc levels: Evaluation of the cervical disc spaces is severely degraded by motion artifact. There is no spinal canal stenosis. At the C5 level, there is a right subarticular disc/osteophyte the narrows the right ventral aspect of thecal sac. MRI THORACIC SPINE FINDINGS Alignment:  Physiologic Vertebrae: There is a hemangioma within the T3 vertebral body. No other focal marrow lesion. Cord: There is a T2 hyperintense lesion within the left aspect of the cord at the T5-T7 levels, unchanged. There are lesions within the right aspect of the cord at the T8 and T9 levels. A T2 hyperintense focus within the left aspect of the cord at the T11 level is favored to be artifactual. Paraspinal and other soft tissues: Negative. Disc levels: There is a disc bulge at the T7-T8 level effacing the ventral thecal sac and contacting the anterior surface of the cord. MRI LUMBAR SPINE FINDINGS Segmentation: Rudimentary  disc space at what is considered on this examination to be S1-S2. This numbering difference from that on the MRI of the lumbar spine performed 05/05/2015. Alignment:  Physiologic. Vertebrae: There is nonspecific heterogeneous marrow signal lumbar spine. Conus medullaris: Extends to the L2-3 level and appears normal. Paraspinal and other soft tissues: 1 cm left renal cyst. Otherwise unremarkable. Disc levels: At L3-L5, there is disc desiccation with small bulge. No spinal canal or neural foraminal stenosis. There is posterior decompression at this level. At L4-L5, there is a diffuse disc bulge with superimposed central protrusion and moderate bilateral facet hypertrophy. There is moderate to severe spinal canal stenosis. There is severe right and mild left neural foraminal stenosis. At L5-S1, there is a diffuse disc bulge and severe bilateral facet hypertrophy resulting in moderate to severe spinal canal stenosis. There is severe left and moderate right neural foraminal stenosis. IMPRESSION: 1. The examination is degraded by motion, particularly the axial imaging of the cervical spine. 2. Multiple lesions within the cervical and thoracic spinal cord. The cervical lesions appear to be unchanged. Comparison of the thoracic spine is limited by the lack of axial imaging on the prior examination. 3. No expansile lesions. Otherwise, evaluation for active demyelination is limited in the absence of IV contrast material. 4. Moderate to severe spinal canal stenosis at L4-L5 and L5-S1 with severe right L4-5 and left L5-S1 foraminal stenosis. This is unchanged compared to the examination of 05/05/2015. 5. Please note differences in numbering of levels between this examination and the lumbar spine MRI 05/05/2015. For the purposes of this dictation, I consider the lowest disc space to be a rudimentary S1-S2 disc. This is with the benefit of counting 7 cervical and 12  thoracic vertebral bodies. Electronically Signed   By: Deatra Robinson M.D.   On: 04/02/2016 01:21   Mr Thoracic Spine Wo Contrast  Result Date: 04/02/2016 CLINICAL DATA:  Multiple sclerosis with bilateral lower extremity weakness EXAM: MRI CERVICAL, THORACIC AND LUMBAR SPINE WITHOUT CONTRAST TECHNIQUE: Multiplanar and multiecho pulse sequences of the cervical spine, to include the craniocervical junction and cervicothoracic junction, and thoracic and lumbar spine, were obtained without intravenous contrast. COMPARISON:  Cervical and thoracic spine MRI 07/15/2015, lumbar spine MRI 05/05/2015 FINDINGS: Despite efforts by the technologist and patient, motion artifact is present on today's examination and could not be eliminated. This reduces the sensitivity and specificity of the study. Particularly, the axial images are severely degraded. MRI CERVICAL SPINE FINDINGS Alignment: There is straightening of the normal cervical lordosis, unchanged. Vertebrae: There is anterior cervical fusion at C5-T1. Cord: Lesion within the right posterior aspect of the cord at the C2 level is unchanged. The other previously seen cord lesions cannot be clearly identified due to motion artifact. A lesion within the right aspect of the cord at C7 is unchanged. There is no cord expansion. No large new lesions are seen on the sagittal inversion recovery sequence. Posterior Fossa, vertebral arteries, paraspinal tissues: Visualized posterior fossa is normal. Vertebral artery flow voids are preserved. Normal visualized paraspinal soft tissues. Disc levels: Evaluation of the cervical disc spaces is severely degraded by motion artifact. There is no spinal canal stenosis. At the C5 level, there is a right subarticular disc/osteophyte the narrows the right ventral aspect of thecal sac. MRI THORACIC SPINE FINDINGS Alignment:  Physiologic Vertebrae: There is a hemangioma within the T3 vertebral body. No other focal marrow lesion. Cord: There is a T2 hyperintense lesion within the left aspect of the cord at  the T5-T7 levels, unchanged. There are lesions within the right aspect of the cord at the T8 and T9 levels. A T2 hyperintense focus within the left aspect of the cord at the T11 level is favored to be artifactual. Paraspinal and other soft tissues: Negative. Disc levels: There is a disc bulge at the T7-T8 level effacing the ventral thecal sac and contacting the anterior surface of the cord. MRI LUMBAR SPINE FINDINGS Segmentation: Rudimentary disc space at what is considered on this examination to be S1-S2. This numbering difference from that on the MRI of the lumbar spine performed 05/05/2015. Alignment:  Physiologic. Vertebrae: There is nonspecific heterogeneous marrow signal lumbar spine. Conus medullaris: Extends to the L2-3 level and appears normal. Paraspinal and other soft tissues: 1 cm left renal cyst. Otherwise unremarkable. Disc levels: At L3-L5, there is disc desiccation with small bulge. No spinal canal or neural foraminal stenosis. There is posterior decompression at this level. At L4-L5, there is a diffuse disc bulge with superimposed central protrusion and moderate bilateral facet hypertrophy. There is moderate to severe spinal canal stenosis. There is severe right and mild left neural foraminal stenosis. At L5-S1, there is a diffuse disc bulge and severe bilateral facet hypertrophy resulting in moderate to severe spinal canal stenosis. There is severe left and moderate right neural foraminal stenosis. IMPRESSION: 1. The examination is degraded by motion, particularly the axial imaging of the cervical spine. 2. Multiple lesions within the cervical and thoracic spinal cord. The cervical lesions appear to be unchanged. Comparison of the thoracic spine is limited by the lack of axial imaging on the prior examination. 3. No expansile lesions. Otherwise, evaluation for active demyelination is limited in the absence of IV contrast material.  4. Moderate to severe spinal canal stenosis at L4-L5 and L5-S1 with  severe right L4-5 and left L5-S1 foraminal stenosis. This is unchanged compared to the examination of 05/05/2015. 5. Please note differences in numbering of levels between this examination and the lumbar spine MRI 05/05/2015. For the purposes of this dictation, I consider the lowest disc space to be a rudimentary S1-S2 disc. This is with the benefit of counting 7 cervical and 12 thoracic vertebral bodies. Electronically Signed   By: Deatra Robinson M.D.   On: 04/02/2016 01:21   Mr Lumbar Spine Wo Contrast  Result Date: 04/02/2016 CLINICAL DATA:  Multiple sclerosis with bilateral lower extremity weakness EXAM: MRI CERVICAL, THORACIC AND LUMBAR SPINE WITHOUT CONTRAST TECHNIQUE: Multiplanar and multiecho pulse sequences of the cervical spine, to include the craniocervical junction and cervicothoracic junction, and thoracic and lumbar spine, were obtained without intravenous contrast. COMPARISON:  Cervical and thoracic spine MRI 07/15/2015, lumbar spine MRI 05/05/2015 FINDINGS: Despite efforts by the technologist and patient, motion artifact is present on today's examination and could not be eliminated. This reduces the sensitivity and specificity of the study. Particularly, the axial images are severely degraded. MRI CERVICAL SPINE FINDINGS Alignment: There is straightening of the normal cervical lordosis, unchanged. Vertebrae: There is anterior cervical fusion at C5-T1. Cord: Lesion within the right posterior aspect of the cord at the C2 level is unchanged. The other previously seen cord lesions cannot be clearly identified due to motion artifact. A lesion within the right aspect of the cord at C7 is unchanged. There is no cord expansion. No large new lesions are seen on the sagittal inversion recovery sequence. Posterior Fossa, vertebral arteries, paraspinal tissues: Visualized posterior fossa is normal. Vertebral artery flow voids are preserved. Normal visualized paraspinal soft tissues. Disc levels: Evaluation  of the cervical disc spaces is severely degraded by motion artifact. There is no spinal canal stenosis. At the C5 level, there is a right subarticular disc/osteophyte the narrows the right ventral aspect of thecal sac. MRI THORACIC SPINE FINDINGS Alignment:  Physiologic Vertebrae: There is a hemangioma within the T3 vertebral body. No other focal marrow lesion. Cord: There is a T2 hyperintense lesion within the left aspect of the cord at the T5-T7 levels, unchanged. There are lesions within the right aspect of the cord at the T8 and T9 levels. A T2 hyperintense focus within the left aspect of the cord at the T11 level is favored to be artifactual. Paraspinal and other soft tissues: Negative. Disc levels: There is a disc bulge at the T7-T8 level effacing the ventral thecal sac and contacting the anterior surface of the cord. MRI LUMBAR SPINE FINDINGS Segmentation: Rudimentary disc space at what is considered on this examination to be S1-S2. This numbering difference from that on the MRI of the lumbar spine performed 05/05/2015. Alignment:  Physiologic. Vertebrae: There is nonspecific heterogeneous marrow signal lumbar spine. Conus medullaris: Extends to the L2-3 level and appears normal. Paraspinal and other soft tissues: 1 cm left renal cyst. Otherwise unremarkable. Disc levels: At L3-L5, there is disc desiccation with small bulge. No spinal canal or neural foraminal stenosis. There is posterior decompression at this level. At L4-L5, there is a diffuse disc bulge with superimposed central protrusion and moderate bilateral facet hypertrophy. There is moderate to severe spinal canal stenosis. There is severe right and mild left neural foraminal stenosis. At L5-S1, there is a diffuse disc bulge and severe bilateral facet hypertrophy resulting in moderate to severe spinal canal stenosis. There is severe  left and moderate right neural foraminal stenosis. IMPRESSION: 1. The examination is degraded by motion, particularly  the axial imaging of the cervical spine. 2. Multiple lesions within the cervical and thoracic spinal cord. The cervical lesions appear to be unchanged. Comparison of the thoracic spine is limited by the lack of axial imaging on the prior examination. 3. No expansile lesions. Otherwise, evaluation for active demyelination is limited in the absence of IV contrast material. 4. Moderate to severe spinal canal stenosis at L4-L5 and L5-S1 with severe right L4-5 and left L5-S1 foraminal stenosis. This is unchanged compared to the examination of 05/05/2015. 5. Please note differences in numbering of levels between this examination and the lumbar spine MRI 05/05/2015. For the purposes of this dictation, I consider the lowest disc space to be a rudimentary S1-S2 disc. This is with the benefit of counting 7 cervical and 12 thoracic vertebral bodies. Electronically Signed   By: Deatra Robinson M.D.   On: 04/02/2016 01:21    Procedures Procedures (including critical care time)  Medications Ordered in ED Medications  ketorolac (TORADOL) 30 MG/ML injection 30 mg (30 mg Intravenous Given 04/01/16 2212)  methylPREDNISolone sodium succinate (SOLU-MEDROL) 125 mg/2 mL injection 125 mg (125 mg Intravenous Given 04/02/16 0135)  fentaNYL (SUBLIMAZE) injection 25 mcg (25 mcg Intravenous Given 04/02/16 0205)     Initial Impression / Assessment and Plan / ED Course  I have reviewed the triage vital signs and the nursing notes.  Pertinent labs & imaging results that were available during my care of the patient were reviewed by me and considered in my medical decision making (see chart for details).  Clinical Course     60 year old female with a history of multiple sclerosis presents to the emergency department for new and worsening bilateral lower extremity weakness. Patient with baseline sensation. Good distal pulses. She is noted to have poor strength in her bilateral lower extremities with little ability to resist  gravity.  MRI shows no evidence of cauda equina. This is a suboptimal study as the patient was unable to tolerate the contrasted portion of this exam to evaluate for active demyelinating lesions. Patient has been given IV steroids under the presumption that her worsening weakness is due to an active MS flare. There is hyperglycemia today with a normal anion gap. Pain managed adequately with Toradol and fentanyl.  Care management became involved in this patient's care early. The patient may benefit from future SNF placement as well. Plan to admit to [facility] Medicine Service given worsening weakness and decline from baseline ADLs. Temporary orders placed for admission following discussion with FM team.   Final Clinical Impressions(s) / ED Diagnoses   Final diagnoses:  Lower extremity weakness  Decubitus ulcer of sacral region, unspecified ulcer stage    New Prescriptions New Prescriptions   No medications on file     Antony Madura, PA-C 04/02/16 0227    Rolland Porter, MD 04/13/16 1819

## 2016-04-01 NOTE — ED Notes (Signed)
PA-C at bedside 

## 2016-04-01 NOTE — ED Notes (Signed)
Burna Mortimer, CSW at bedside speaking with patient and family.

## 2016-04-01 NOTE — ED Triage Notes (Signed)
Per [facility]: Pt to ED from home c/o chronic lower back pain and bed sores - was told by her PCP that she needed to be admitted for IV steroids. Pt states she also has "cellulitis in BLE d/t venous stasis." Lower legs are wrapped because of weeping. Pt hx MS, unable to walk but she can stand, per EMS, sister states that pt's mobility has worsened in the last 3 weeks. Hx HTN, DM 2, DDD. EMS VS: 138/72, P 74, RR 12, 94% RA. Patient A&O x 4.

## 2016-04-01 NOTE — Telephone Encounter (Signed)
Form re-faxed. Pt notified. Sunday Spillers, CMA

## 2016-04-01 NOTE — Telephone Encounter (Signed)
Noted. Recommend she continue to follow up with her PCP. -VRP

## 2016-04-01 NOTE — Care Management (Signed)
ED CM met with patient at bedside. Patient reports living alone with MS and is w/c bound. Patient does not ambulate but was able to transfer until approx 2 weeks ago. Patient is no longer able to bear weight to perform transfers. Patient has hx of cellulitis with bilateral leg wounds.  Discussed [place] services vs SNF. Patient has had [facility] with [facility] in the past. ED evaluation still in progress, EDP has consulted with Hospitalist concerning further evaluations.

## 2016-04-02 ENCOUNTER — Telehealth: Payer: Self-pay | Admitting: Diagnostic Neuroimaging

## 2016-04-02 DIAGNOSIS — Z66 Do not resuscitate: Secondary | ICD-10-CM | POA: Diagnosis not present

## 2016-04-02 DIAGNOSIS — R531 Weakness: Secondary | ICD-10-CM | POA: Diagnosis not present

## 2016-04-02 DIAGNOSIS — T380X5A Adverse effect of glucocorticoids and synthetic analogues, initial encounter: Secondary | ICD-10-CM | POA: Diagnosis not present

## 2016-04-02 DIAGNOSIS — G35 Multiple sclerosis: Secondary | ICD-10-CM | POA: Diagnosis not present

## 2016-04-02 DIAGNOSIS — G8220 Paraplegia, unspecified: Secondary | ICD-10-CM | POA: Diagnosis not present

## 2016-04-02 DIAGNOSIS — D72829 Elevated white blood cell count, unspecified: Secondary | ICD-10-CM | POA: Diagnosis not present

## 2016-04-02 DIAGNOSIS — M4802 Spinal stenosis, cervical region: Secondary | ICD-10-CM | POA: Diagnosis not present

## 2016-04-02 DIAGNOSIS — L89152 Pressure ulcer of sacral region, stage 2: Secondary | ICD-10-CM | POA: Diagnosis not present

## 2016-04-02 DIAGNOSIS — M6281 Muscle weakness (generalized): Secondary | ICD-10-CM | POA: Diagnosis not present

## 2016-04-02 DIAGNOSIS — I872 Venous insufficiency (chronic) (peripheral): Secondary | ICD-10-CM | POA: Diagnosis not present

## 2016-04-02 DIAGNOSIS — E119 Type 2 diabetes mellitus without complications: Secondary | ICD-10-CM | POA: Diagnosis not present

## 2016-04-02 DIAGNOSIS — R29898 Other symptoms and signs involving the musculoskeletal system: Secondary | ICD-10-CM | POA: Diagnosis present

## 2016-04-02 DIAGNOSIS — M48061 Spinal stenosis, lumbar region without neurogenic claudication: Secondary | ICD-10-CM | POA: Diagnosis not present

## 2016-04-02 DIAGNOSIS — F801 Expressive language disorder: Secondary | ICD-10-CM | POA: Diagnosis not present

## 2016-04-02 DIAGNOSIS — M545 Low back pain: Secondary | ICD-10-CM | POA: Diagnosis not present

## 2016-04-02 DIAGNOSIS — L97829 Non-pressure chronic ulcer of other part of left lower leg with unspecified severity: Secondary | ICD-10-CM | POA: Diagnosis not present

## 2016-04-02 DIAGNOSIS — L89312 Pressure ulcer of right buttock, stage 2: Secondary | ICD-10-CM | POA: Diagnosis not present

## 2016-04-02 DIAGNOSIS — Z6841 Body Mass Index (BMI) 40.0 and over, adult: Secondary | ICD-10-CM | POA: Diagnosis not present

## 2016-04-02 DIAGNOSIS — R1313 Dysphagia, pharyngeal phase: Secondary | ICD-10-CM | POA: Diagnosis not present

## 2016-04-02 DIAGNOSIS — L89612 Pressure ulcer of right heel, stage 2: Secondary | ICD-10-CM | POA: Diagnosis not present

## 2016-04-02 DIAGNOSIS — M75102 Unspecified rotator cuff tear or rupture of left shoulder, not specified as traumatic: Secondary | ICD-10-CM | POA: Diagnosis not present

## 2016-04-02 DIAGNOSIS — E11622 Type 2 diabetes mellitus with other skin ulcer: Secondary | ICD-10-CM | POA: Diagnosis not present

## 2016-04-02 DIAGNOSIS — R252 Cramp and spasm: Secondary | ICD-10-CM | POA: Diagnosis not present

## 2016-04-02 DIAGNOSIS — S46011A Strain of muscle(s) and tendon(s) of the rotator cuff of right shoulder, initial encounter: Secondary | ICD-10-CM | POA: Diagnosis not present

## 2016-04-02 DIAGNOSIS — L89159 Pressure ulcer of sacral region, unspecified stage: Secondary | ICD-10-CM

## 2016-04-02 DIAGNOSIS — M75101 Unspecified rotator cuff tear or rupture of right shoulder, not specified as traumatic: Secondary | ICD-10-CM | POA: Diagnosis not present

## 2016-04-02 DIAGNOSIS — I10 Essential (primary) hypertension: Secondary | ICD-10-CM | POA: Diagnosis not present

## 2016-04-02 DIAGNOSIS — R279 Unspecified lack of coordination: Secondary | ICD-10-CM | POA: Diagnosis not present

## 2016-04-02 DIAGNOSIS — G8929 Other chronic pain: Secondary | ICD-10-CM | POA: Diagnosis not present

## 2016-04-02 DIAGNOSIS — Z7409 Other reduced mobility: Secondary | ICD-10-CM | POA: Diagnosis not present

## 2016-04-02 DIAGNOSIS — I878 Other specified disorders of veins: Secondary | ICD-10-CM | POA: Diagnosis not present

## 2016-04-02 DIAGNOSIS — R32 Unspecified urinary incontinence: Secondary | ICD-10-CM | POA: Diagnosis not present

## 2016-04-02 DIAGNOSIS — L89322 Pressure ulcer of left buttock, stage 2: Secondary | ICD-10-CM | POA: Diagnosis not present

## 2016-04-02 DIAGNOSIS — Z515 Encounter for palliative care: Secondary | ICD-10-CM | POA: Diagnosis not present

## 2016-04-02 DIAGNOSIS — Z7952 Long term (current) use of systemic steroids: Secondary | ICD-10-CM | POA: Diagnosis not present

## 2016-04-02 DIAGNOSIS — S46009A Unspecified injury of muscle(s) and tendon(s) of the rotator cuff of unspecified shoulder, initial encounter: Secondary | ICD-10-CM | POA: Diagnosis not present

## 2016-04-02 HISTORY — DX: Other symptoms and signs involving the musculoskeletal system: R29.898

## 2016-04-02 LAB — CBC
HCT: 40.9 % (ref 36.0–46.0)
Hemoglobin: 14.1 g/dL (ref 12.0–15.0)
MCH: 30.7 pg (ref 26.0–34.0)
MCHC: 34.5 g/dL (ref 30.0–36.0)
MCV: 89.1 fL (ref 78.0–100.0)
PLATELETS: 177 10*3/uL (ref 150–400)
RBC: 4.59 MIL/uL (ref 3.87–5.11)
RDW: 17.1 % — AB (ref 11.5–15.5)
WBC: 12.6 10*3/uL — ABNORMAL HIGH (ref 4.0–10.5)

## 2016-04-02 LAB — GLUCOSE, CAPILLARY
GLUCOSE-CAPILLARY: 339 mg/dL — AB (ref 65–99)
GLUCOSE-CAPILLARY: 164 mg/dL — AB (ref 65–99)
Glucose-Capillary: 306 mg/dL — ABNORMAL HIGH (ref 65–99)
Glucose-Capillary: 175 mg/dL — ABNORMAL HIGH (ref 65–99)

## 2016-04-02 LAB — PHOSPHORUS: PHOSPHORUS: 3.4 mg/dL (ref 2.5–4.6)

## 2016-04-02 LAB — CREATININE, SERUM
CREATININE: 0.96 mg/dL (ref 0.44–1.00)
GFR calc Af Amer: >60 mL/min (ref >60)
GFR calc non Af Amer: >60 mL/min (ref >60)

## 2016-04-02 LAB — MAGNESIUM: Magnesium: 1.7 mg/dL (ref 1.7–2.4)

## 2016-04-02 MED ORDER — ENOXAPARIN SODIUM 40 MG/0.4ML ~~LOC~~ SOLN
40.0000 mg | SUBCUTANEOUS | Status: DC
  Administered 2016-04-02 – 2016-04-05 (×4): 40 mg via SUBCUTANEOUS
  Filled 2016-04-02 (×4): qty 0.4

## 2016-04-02 MED ORDER — ASPIRIN EC 325 MG PO TBEC
325.0000 mg | DELAYED_RELEASE_TABLET | Freq: Every day | ORAL | Status: DC
  Administered 2016-04-02 – 2016-04-05 (×4): 325 mg via ORAL
  Filled 2016-04-02 (×4): qty 1

## 2016-04-02 MED ORDER — DIAZEPAM 5 MG PO TABS
5.0000 mg | ORAL_TABLET | Freq: Four times a day (QID) | ORAL | Status: DC | PRN
  Administered 2016-04-02 – 2016-04-05 (×4): 5 mg via ORAL
  Filled 2016-04-02 (×4): qty 1

## 2016-04-02 MED ORDER — OXYBUTYNIN CHLORIDE ER 5 MG PO TB24
5.0000 mg | ORAL_TABLET | Freq: Every day | ORAL | Status: DC
  Administered 2016-04-02 – 2016-04-04 (×3): 5 mg via ORAL
  Filled 2016-04-02 (×4): qty 1

## 2016-04-02 MED ORDER — GABAPENTIN 300 MG PO CAPS
300.0000 mg | ORAL_CAPSULE | Freq: Three times a day (TID) | ORAL | Status: DC
Start: 2016-04-02 — End: 2016-04-05
  Administered 2016-04-02 – 2016-04-05 (×11): 300 mg via ORAL
  Filled 2016-04-02 (×11): qty 1

## 2016-04-02 MED ORDER — FENTANYL CITRATE (PF) 100 MCG/2ML IJ SOLN
25.0000 ug | Freq: Once | INTRAMUSCULAR | Status: AC
  Administered 2016-04-02: 25 ug via INTRAVENOUS
  Filled 2016-04-02: qty 2

## 2016-04-02 MED ORDER — METHOCARBAMOL 750 MG PO TABS
1500.0000 mg | ORAL_TABLET | Freq: Three times a day (TID) | ORAL | Status: DC
  Administered 2016-04-02 – 2016-04-05 (×11): 1500 mg via ORAL
  Filled 2016-04-02 (×11): qty 2

## 2016-04-02 MED ORDER — LIVING WELL WITH DIABETES BOOK
Freq: Once | Status: AC
  Administered 2016-04-02: 15:00:00
  Filled 2016-04-02: qty 1

## 2016-04-02 MED ORDER — INSULIN ASPART 100 UNIT/ML ~~LOC~~ SOLN
0.0000 [IU] | Freq: Three times a day (TID) | SUBCUTANEOUS | Status: DC
  Administered 2016-04-02 (×2): 15 [IU] via SUBCUTANEOUS
  Administered 2016-04-02: 4 [IU] via SUBCUTANEOUS
  Administered 2016-04-03: 15 [IU] via SUBCUTANEOUS
  Administered 2016-04-03: 20 [IU] via SUBCUTANEOUS
  Administered 2016-04-03 – 2016-04-04 (×3): 15 [IU] via SUBCUTANEOUS
  Administered 2016-04-04: 20 [IU] via SUBCUTANEOUS
  Administered 2016-04-05: 15 [IU] via SUBCUTANEOUS
  Administered 2016-04-05: 11 [IU] via SUBCUTANEOUS

## 2016-04-02 MED ORDER — PANTOPRAZOLE SODIUM 40 MG PO TBEC
40.0000 mg | DELAYED_RELEASE_TABLET | Freq: Every day | ORAL | Status: DC
  Administered 2016-04-02 – 2016-04-05 (×4): 40 mg via ORAL
  Filled 2016-04-02 (×4): qty 1

## 2016-04-02 MED ORDER — SODIUM CHLORIDE 0.9 % IV SOLN
500.0000 mg | Freq: Two times a day (BID) | INTRAVENOUS | Status: DC
  Administered 2016-04-02 – 2016-04-05 (×6): 500 mg via INTRAVENOUS
  Filled 2016-04-02 (×11): qty 4

## 2016-04-02 MED ORDER — METHYLPREDNISOLONE SODIUM SUCC 125 MG IJ SOLR
125.0000 mg | Freq: Once | INTRAMUSCULAR | Status: AC
  Administered 2016-04-02: 125 mg via INTRAVENOUS
  Filled 2016-04-02: qty 2

## 2016-04-02 MED ORDER — TRAMADOL HCL 50 MG PO TABS
50.0000 mg | ORAL_TABLET | Freq: Four times a day (QID) | ORAL | Status: DC
  Administered 2016-04-02 – 2016-04-05 (×14): 50 mg via ORAL
  Filled 2016-04-02 (×14): qty 1

## 2016-04-02 MED ORDER — COLLAGENASE 250 UNIT/GM EX OINT
TOPICAL_OINTMENT | Freq: Every day | CUTANEOUS | Status: DC
  Administered 2016-04-03 – 2016-04-05 (×2): via TOPICAL
  Filled 2016-04-02: qty 30

## 2016-04-02 MED ORDER — INSULIN ASPART 100 UNIT/ML ~~LOC~~ SOLN
0.0000 [IU] | Freq: Every day | SUBCUTANEOUS | Status: DC
  Administered 2016-04-03 – 2016-04-04 (×2): 4 [IU] via SUBCUTANEOUS

## 2016-04-02 MED ORDER — METOCLOPRAMIDE HCL 10 MG PO TABS: 10.0000 mg | ORAL_TABLET | Freq: Four times a day (QID) | ORAL | Status: DC | PRN

## 2016-04-02 NOTE — NC FL2 (Signed)
[place] MEDICAID FL2 LEVEL OF CARE SCREENING TOOL     IDENTIFICATION  Patient Name: Barbara SolianSharron Zurn Birthdate: 07/11/1955 Sex: female Admission Date (Current Location): 04/01/2016  [facility] [place]  [employer]   Facility and Address:  [facility], [address], [place], [place] [address]      Provider Number: [id number]  Attending Physician Name and Address:  Carney LivingMarshall L Chambliss, MD  Relative Name and Phone Number:       Current Level of Care: Hospital Recommended Level of Care: Skilled Nursing Facility Prior Approval Number:    Date Approved/Denied:   PASRR Number: [id number]  Discharge Plan: SNF    Current Diagnoses: Patient Active Problem List   Diagnosis Date Noted  . Lower extremity weakness 04/02/2016  . Decubitus ulcer of sacral region   . Impaired mobility and activities of daily living 03/07/2016  . Palliative care encounter 03/07/2016  . Current chronic use of systemic steroids 03/07/2016  . DM type 2, not at goal [facility](HCC) 03/06/2016  . Cellulitis and abscess of leg 08/28/2015  . Weakness of both lower extremities   . Absence of bladder continence   . Essential hypertension   . Spastic paraplegia (HCC)   . Weakness 07/12/2015  . Encounter for chronic pain management 04/21/2014  . Left hip pain 11/09/2013  . Right hip pain 06/09/2013  . Chronic venous insufficiency 11/05/2012  . Ankle pain 05/18/2012  . Spinal stenosis in cervical region 06/03/2011  . Multiple sclerosis (HCC) 12/07/2010  . 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 Spinal stenosis of lumbar region 12/07/2010  . Hypertension 12/07/2010  . Obesity 12/07/2010  . Chronic gout 12/07/2010    Orientation RESPIRATION BLADDER Height & Weight     Self, Time, Situation, Place  Normal Incontinent Weight: 271 lb 2.7 oz (123 kg) Height:  5\' 3"  (160 cm)  BEHAVIORAL SYMPTOMS/MOOD NEUROLOGICAL BOWEL NUTRITION STATUS   (None)  (None) Continent Diet (Heart healthy/carb  modified)  AMBULATORY STATUS COMMUNICATION OF NEEDS Skin   Total Care (Wheelchair bound at baseline) Verbally Other (Comment) (Cellulitis, MASD, Excoriated, Open/dehisced wound/incision both legs, left toe, both posterior thighs, coccyx.)                       Personal Care Assistance Level of Assistance   (Waiting on OT eval)           Functional Limitations Info  Sight, Hearing, Speech Sight Info: Impaired (Blind right eye) Hearing Info: Adequate Speech Info: Adequate    SPECIAL CARE FACTORS FREQUENCY  PT (By licensed PT), Blood pressure, Diabetic urine testing, OT (By licensed OT)     PT Frequency: 5 x week OT Frequency: 5 x week            Contractures Contractures Info: Not present    Additional Factors Info  Code Status, Allergies Code Status Info: DNR Allergies Info: NKDA           Current Medications (04/02/2016):  This is the current hospital active medication list Current Facility-Administered Medications  Medication Dose Route Frequency Provider Last Rate Last Dose  . aspirin EC tablet 325 mg  325 mg Oral Daily Pincus LargeJazma Y Phelps, DO   325 mg at 04/02/16 1044  . diazepam (VALIUM) tablet 5 mg  5 mg Oral Q6H PRN Pincus LargeJazma Y Phelps, DO   5 mg at 04/02/16 1442  . enoxaparin (LOVENOX) injection 40 mg  40 mg Subcutaneous Q24H Pincus LargeJazma Y Phelps, DO  40 mg at 04/02/16 1042  . gabapentin (NEURONTIN) capsule 300 mg  300 mg Oral TID Pincus Large, DO   300 mg at 04/02/16 1043  . insulin aspart (novoLOG) injection 0-20 Units  0-20 Units Subcutaneous TID WC Pincus Large, DO   15 Units at 04/02/16 1223  . insulin aspart (novoLOG) injection 0-5 Units  0-5 Units Subcutaneous QHS Pincus Large, DO      . methocarbamol (ROBAXIN) tablet 1,500 mg  1,500 mg Oral TID Pincus Large, DO   1,500 mg at 04/02/16 1043  . metoCLOPramide (REGLAN) tablet 10 mg  10 mg Oral Q6H PRN Pincus Large, DO      . oxybutynin (DITROPAN-XL) 24 hr tablet 5 mg  5 mg Oral QHS Jazma Y Phelps, DO       . pantoprazole (PROTONIX) EC tablet 40 mg  40 mg Oral Daily Pincus Large, DO   40 mg at 04/02/16 1041  . traMADol (ULTRAM) tablet 50 mg  50 mg Oral Q6H Pincus Large, DO   50 mg at 04/02/16 1239     Discharge Medications: Please see discharge summary for a list of discharge medications.  Relevant Imaging Results:  Relevant Lab Results:   Additional Information SS#: [id number]. Will need air mattress.  Margarito Liner, LCSW

## 2016-04-02 NOTE — Clinical Social Work Note (Signed)
Clinical Social Work Assessment  Patient Details  Name: Barbara [name] MRN: [id number] Date of Birth: 10/31/1955  Date of referral:  04/02/16               Reason for consult:  Facility Placement, Discharge Planning                Permission sought to share information with:  Facility Sport and exercise psychologist, Family Supports Permission granted to share information::  Yes, Verbal Permission Granted  Name::     Lorre Munroe  Agency::  SNF's  Relationship::  Sister  Contact Information:  [phone]  Housing/Transportation Living arrangements for the past 2 months:  Single Family Home Source of Information:  Patient, Medical Team Patient Interpreter Needed:  None Criminal Activity/Legal Involvement Pertinent to Current Situation/Hospitalization:  No - Comment as needed Significant Relationships:  Siblings Lives with:  Self Do you feel safe going back to the place where you live?  Yes Need for family participation in patient care:  Yes (Comment)  Care giving concerns:  PT recommending SNF once medically stable for discharge.   Social Worker assessment / plan:  CSW met with patient. No supports at bedside. CSW introduced role and explained that PT recommendations would be discussed. Patient agreeable to SNF if it will help her recover faster. First preference is [place] because she has been there before and it is close to her family. Patient will need an air mattress at SNF, per [facility]. Patient is wheelchair bound at baseline, per PT note. No further concerns. CSW encouraged patient to contact CSW as needed. CSW will continue to follow patient for support and facilitate discharge to SNF once medically stable.  Employment status:  Disabled (Comment on whether or not currently receiving Disability) Insurance information:  Other (Comment Required) ([employer]) PT Recommendations:  [place] / Referral to community resources:  [place]  Patient/Family's Response to care:  Patient agreeable to SNF placement. Patient's family supportive and involved in patient's care. Patient appreciated social work intervention.  Patient/Family's Understanding of and Emotional Response to Diagnosis, Current Treatment, and Prognosis:  Patient understands and is agreeable to the discharge plan. Patient appears happy with hospital care.  Emotional Assessment Appearance:  Appears stated age Attitude/Demeanor/Rapport:  Other (Pleasant) Affect (typically observed):  Accepting, Appropriate, Pleasant, Happy Orientation:  Oriented to Self, Oriented to Place, Oriented to  Time, Oriented to Situation Alcohol / Substance use:  Never Used Psych involvement (Current and /or in the community):  No (Comment)  Discharge Needs  Concerns to be addressed:  Care Coordination Readmission within the last 30 days:  Yes Current discharge risk:  Dependent with Mobility, Lives alone Barriers to Discharge:  Ship broker, Continued Medical Work up   Candie Chroman, LCSW 04/02/2016, 3:24 PM

## 2016-04-02 NOTE — Consult Note (Addendum)
WOC Nurse wound consult note Reason for Consult: decubitus to buttocks, venous stasis ulcers to lower extremities  Wound type: Stage 2 pressure injury to sacrum Stage 2 pressure injury to Right gluteal combination friction/shear and MASD  Stage 2 pressure injury to right to Left gluteal combination friction/shear and MASD  Left lateral leg partial thickness venous ulcer  Left medial leg venous ulcer  Right lateral leg venous ulcer  Stage 2 pressure injury to right heel  Scattered friction/Shear and MASD areas to bilateral posterior thighs  Pressure Ulcer POA: x4 Stage 2 pressure injury to sacrum- YES  Stage 2 pressure injury to Right gluteal combination friction/shear and MASD-YES  Stage 2 pressure injury to right to Left gluteal combination friction/shear and MASD-YES  Stage 2 pressure injury to right heel-YES   Measurement: Stage 2 pressure injury to sacrum- 2.5cmx0.5cmx0.1cm  Stage 2 pressure injury to Right gluteal combination friction/shear and MASD: 3cmx2.5cmx0.1cm  Stage 2 pressure injury to right to Left gluteal combination friction/shear and MASD: 4cmx3.4cmx0.1cm  Left lateral leg partial thickness venous ulcer:5.5cmx4cmx0.1cm  Left medial leg venous ulcer: 6cmx3.2cm.0.1cm  Right lateral leg venous ulcer:4cmx2.5cmx0.2cm  Stage 2 pressure injury to right heel: 3cmx0.4cm  Scattered friction/Shear and MASD areas to bilateral posterior thighs: not measured   Wound bed: Bilateral lower extremity wounds are pink with clean wound bed. They area draining a large amount of serous sanguineous drainage L >R.   Stage 2 pressure injury to sacrum: 90% slough with a small amount of budded pink tissue. No drainage or odor. Periwound with dark discoloration  Stage 2 pressure injury to Right gluteal combination friction/shear and MASD: 20% soft black tissue, 80% pink non granular tissue with moderate amount of serous sanguineous drainage, no odor. Peri wound with dark  discoloration   Stage 2 pressure injury to right to Left gluteal combination friction/shear and MASD: 30% soft black tissue, 70% pink non granular tissue. Moderate amount of serous sanguineous drainage present, no odor. periwound with dark discoloration  Stage 2 pressure injury to right heel: serous filled bullae, periwound is intact but scaly  Scattered friction/Shear and MASD areas to bilateral posterior thighs: small amount serous sanguinous drainage, no odor, periwound is erythematous    Dressing procedure/placement/frequency: Stage 2 pressure injury to sacrum: Apply enzymatic debridement ointment 1/4 inch thick, cover with bordered foam dressing   Stage 2 pressure injury to Right gluteal combination friction/shear and MASD: Apply enzymatic debridement ointment 1/4 inch thick, cover with bordered foam dressing   Stage 2 pressure injury to right to Left gluteal combination friction/shear and MASD: Apply enzymatic debridement ointment 1/4 inch thick, cover with bordered foam dressing   Left lateral leg partial thickness venous ulcer: calcium alginate cut to fit to wound bed, cover with ABD pad, dry compression wrap with 2 layer (kerlix and coban)   Left medial leg venous ulcer: calcium alginate cut to fit to wound bed, cover with ABD pad,dry compression wrap with 2 layer (kerlix and coban)    Right lateral leg venous ulcer: calcium alginate cut to fit to wound bed,  cover with ABD pad,dry compression wrap with 2 layer (kerlix and coban)   Stage 2 pressure injury to right heel: bordered foam dressing  Scattered friction/Shear and MASD areas to bilateral posterior thighs: apply barrier ointment atleast every shift and after every episode of incontinence   Pt is currently in a bariatric, low air loss mattress. States she has a hospital bed at home with a regular mattress. Recommended she have a low  air loss mattress at home.   Durel Salts FNP-C , [facility]  Discussed POC with patient and  bedside nurse.  Re consult if needed, will not follow at this time. Thanks  Cardin Nitschke [employer], RN,CWOCN, CNS [phone])  Addendum: WOC nurse returned to assess LEs.  I placed topical wound care to the open ulcers and foam to the right heel; I then placed kerlix and coban with minimal stretch to the LE.  Discussed with patient and bedside nurse who will monitor patient's tolerance of the wraps and if patient is not able to tolerate will remove compression layer.  Family medicine at bedside and I was able to review skin and wound care regimen with them.  May need new meds for muscle spasm if she can tolerate compression.  WOC team will follow along with you for management of her skin and wound issues.  Davina Poke MSN, RN, CWOCN, CNS [phone])

## 2016-04-02 NOTE — Consult Note (Addendum)
   [facility]   04/02/2016  Barbara SolianSharron Hench 04/10/1956 [id number]    [facility] received. Spoke with inpatient RNCM prior to bedside encounter with Barbara [name]. Spoke with Barbara [name] at bedside to discuss and offer [facility] program services. Written consent obtained [facility]. Barbara [name] reports she will go to SNF post discharge but would like [facility] follow up as a benefit of her [employer] insurance.   Barbara [name] states she lives alone but has friends and family that check on her. Reports her sister takes her to MD appointments. Endorses that she cannot afford her MS medication, Copaxin. She states her MS medication has a 2600.00 dollar a month copay. States she had 2 grants for the medication but it was only for a total of  3 months. States she did not continue to take the medication since she would not be able to afford the MS drug the rest of the year for the next 9 months. [facility].  Also agreeable to [facility] Licensed CSW referral for community resources and [facility]   Confirmed cell number as [phone] and home number as [phone]. Primary Care MD is Dr. Leveda AnnaHensel. Discussed how she used to work at [employer] for 20 years before she was not able to work anymore due to her MS. She also has a history of newly dx of DM on 03/06/16, A1C of 10.3, HTN, decubitus ulcers.  [facility] Care [facility] packet was provided along with 24-hr nurse line magnet, and contact information.   Will make referral for [facility] Licensed CSW for follow up at SNF and with potential community resources for when she does return home. Also [facility] Pharmacist for follow up on other possible MS medication assistant programs. Will request for Barbara [name] [facility] once she returns home.  Will make inpatient RNCM aware that [facility] will follow post discharge. Barbara [name] expressed appreciation of visit.    Raiford NobleAtika Simuel Stebner, MSN-Ed, RN,BSN [facility] Care [facility] Hospital Liaison [phone]

## 2016-04-02 NOTE — ED Notes (Signed)
Pt. Stated that her bottom was hurting severely. Asked to be moved on her side. Positioned pt on her right side.Pt said her pain went down to a 3.

## 2016-04-02 NOTE — ED Notes (Addendum)
Pt still at MRI

## 2016-04-02 NOTE — Progress Notes (Addendum)
Inpatient Diabetes Program Recommendations  AACE/ADA: New Consensus Statement on Inpatient Glycemic Control (2015)  Target Ranges:  Prepandial:   less than 140 mg/dL      Peak postprandial:   less than 180 mg/dL (1-2 hours)      Critically ill patients:  140 - 180 mg/dL   Results for Barbara [name], Barbara [name] (MRN [id number]) as of 04/02/2016 11:34  Ref. Range 04/02/2016 08:32  Glucose-Capillary Latest Ref Range: 65 - 99 mg/dL 109 (H)  Results for Barbara [name], Barbara [name] (MRN [id number]) as of 04/02/2016 11:34  Ref. Range 04/01/2016 20:57  Glucose Latest Ref Range: 65 - 99 mg/dL 025 (H)   Results for Barbara [name], Barbara [name] (MRN [id number]) as of 04/02/2016 11:34  Ref. Range 03/06/2016 15:50  Hemoglobin A1C Unknown 10.3   Review of Glycemic Control  Diabetes history: DM2 (dx 03/06/16) Outpatient Diabetes medications: Metformin 1000 mg BID Current orders for Inpatient glycemic control: Novolog 0-20 units TID with meals, Novolog 0-5 units QHS  Inpatient Diabetes Program Recommendations: Insulin - Basal: Please consider ordering low dose basal insulin. Recommend starting with Lantus 12 units daily (based on 123 kg x 0.1 units). HgbA1C: Newly dx with DM2 on 03/06/16 with A1C of 10.3% indicating an average glucose of 249 mg/dl over the past 2-3 months. Patient is chronically on steroids for MS.   Addendum 04/02/16@15 :20-Spoke with patient over the phone about recent new diabetes diagnosis (on 03/06/16).  Patient reports that she has several family member with diabetes and has some general knowledge of diabetes. Discussed A1C results (10.3% on 03/06/16) and explained what an A1C is and informed patient that her current A1C indicates an average glucose of 249 mg/dl over the past 2-3 months. Discussed basic pathophysiology of DM Type 2, basic home care, importance of checking CBGs and maintaining good CBG control to prevent long-term and short-term complications. Reviewed glucose and A1C goals. Reviewed signs and symptoms  of hyperglycemia and hypoglycemia along with treatment for both. Patient states that she has been having frequent urination and increased fatigue but she expects to have those symptoms from MS. Explained that if MS causes some of the same symptoms she may need to check her glucose if she is experiencing the symptoms to determine if they are from elevated glucose or MS.  Discussed impact of nutrition, exercise, stress, sickness, and medications on diabetes control Briefly discussed Carb Modified diet and informed patient that consult for RD had been placed and RD will provide further diet education.  Patient takes steroids chronically so explained how they cause hyperglycemia.  Informed patient that Living Well with diabetes booklet has been ordered and she should be receiving it from the bedside RN. Encouraged patient to read through entire book. Patient reports that she does not have a glucometer yet as there was an issue with the prescription for glucometer and testing supplies being faxed correctly but she reports that she thinks the outpatient pharmacy has the prescription now and she will just need to pick it up.  Discussed Metformin and how it works for DM control and explained that she may need additional DM medications if glucose is not controlled with diet and Metformin. Patient verbalized understanding of information discussed and she states that she has no further questions at this time related to diabetes.   RNs to provide ongoing basic DM education at bedside with this patient and engage patient to actively check blood glucose and administer insulin injections.   Thanks, Orlando Penner, RN, MSN, CDE Diabetes Coordinator Inpatient Diabetes Program [phone] (Team  Pager from [place] to [place])

## 2016-04-02 NOTE — Progress Notes (Signed)
  Spoke with patient's neurologist, Dr. Marjory LiesPenumalli, at [facility]. Recommendations from him include: helping patient get in contact with case management regarding affording her medications, considering MRI brain to ascertain if this is a true MS flair vs deconditioning, discontinuing long term oral steroid use as maintenance therapy for MS, and treating acute flair with IV methylpred vs oral prednisone if clinically indicated.   Dolores PattyAngela Crixus Mcaulay, DO PGY-1,  Family Medicine 04/02/2016 2:26 PM

## 2016-04-02 NOTE — Progress Notes (Signed)
  FPTS Interim Progress Note  S: Ms. Langelier is doing okay this morning, denies pain. She was able to sleep some. She is willing to go to SNF if necessary for recovery/regain strength.   O: BP 126/69   Pulse 89   Temp 98.2 F (36.8 C)   Resp (!) 21   Ht 5\' 3"  (1.6 m)   Wt 271 lb 2.7 oz (123 kg)   LMP 10/07/2010   SpO2 97%   BMI 48.03 kg/m   Gen: morbidly obese lady, laying in bed, in NAD CV: RRR no MRG Lungs: CTA in anterior lung fields Abdomen: obese abdomen, soft, non-tender Extremities: chronic venous stasis changes, no warmth, lower legs wrapped in bandages  A/P:  LE weakness- secondary to MS flair -Continue 500mg  solumedrol for 5 days -Cotinue Robaxin 1500mg  TID, gabapentin 300mg  TID, ASA 325 daily -PT/OT consult  Decubitus ulcer- new problem for patient, due to laying in bed for several weeks -wound care consult  HTN- blood pressure 126/69 this morning -lisinopril held at time of admission, will add back as tolerated  Leukocytosis- likely due to chronic steroid use -monitor  T2DM- takes metformin at home, held -monitor sugars AC/HS -rSSI  Incontinence- chronic problem -continue oxybuynin  Chronic venous stasis- -PO lasix, wound consult  Chronic pain -continue home tramadol  Tillman Sers, DO 04/02/2016, 7:31 AM PGY-1, [facility] Family Medicine Service pager [phone]

## 2016-04-02 NOTE — Progress Notes (Addendum)
Pt has multiple wounds from waste down. Both lower legs are bandaged from home. Foam is placed on bilat posterior thighs and sacral areas. Left toe is open to air. These wounds are observed with Eddie Candle, RN, but pt prefers [place] wait until later to measure and thorough observe due to her being so tired at this time. Pt would benefit from a  Wound care consult. Will pass on to oncoming nurse. Pt is on an air mattress.

## 2016-04-02 NOTE — Care Management Note (Signed)
Case Management Note  Patient Details  Name: Barbara [name] MRN: [id number] Date of Birth: 10-09-1955  Subjective/Objective:                    Action/Plan:   Expected Discharge Date:                  Expected Discharge Plan:  Skilled Nursing Facility  In-House Referral:  Clinical Social Work  Discharge planning Services  CM Consult  Post Acute Care Choice:    Choice offered to:  Patient  DME Arranged:    DME Agency:     HH Arranged:  RN, PT, OT, Nurse's Aide HH Agency:  [facility]  Status of Service:  Completed, signed off  If discussed at Long Length of Stay Meetings, dates discussed:    Additional Comments:  Kingsley Plan, RN 04/02/2016, 2:03 PM

## 2016-04-02 NOTE — ED Notes (Signed)
Pt at MRI still

## 2016-04-02 NOTE — Clinical Social Work Placement (Signed)
   CLINICAL SOCIAL WORK PLACEMENT  NOTE  Date:  04/02/2016  Patient Details  Name: Barbara [name] MRN: [id number] Date of Birth: 1956/02/16  Clinical Social Work is seeking post-discharge placement for this patient at the Skilled  Nursing Facility level of care (*CSW will initial, date and re-position this form in  chart as items are completed):  Yes   Patient/family provided with [place] Clinical Social Work Department's list of facilities offering this level of care within the geographic area requested by the patient (or if unable, by the patient's family).  Yes   Patient/family informed of their freedom to choose among providers that offer the needed level of care, that participate in Medicare, Medicaid or managed care program needed by the patient, have an available bed and are willing to accept the patient.  Yes   Patient/family informed of [place]'s ownership interest in [facility] and [facility], as well as of the fact that they are under no obligation to receive care at these facilities.  PASRR submitted to EDS on 04/02/16     PASRR number received on       Existing PASRR number confirmed on 04/02/16     FL2 transmitted to all facilities in geographic area requested by pt/family on 04/02/16     FL2 transmitted to all facilities within larger geographic area on       Patient informed that his/her managed care company has contracts with or will negotiate with certain facilities, including the following:            Patient/family informed of bed offers received.  Patient chooses bed at       Physician recommends and patient chooses bed at      Patient to be transferred to   on  .  Patient to be transferred to facility by       Patient family notified on   of transfer.  Name of family member notified:        PHYSICIAN Please sign FL2     Additional Comment:    _______________________________________________ Margarito Liner, LCSW 04/02/2016,  3:27 PM

## 2016-04-02 NOTE — Telephone Encounter (Signed)
error 

## 2016-04-02 NOTE — H&P (Signed)
[facility] History and Physical Service Pager: [phone]  Patient name: Barbara [name] Medical record number: [id number] Date of birth: 03/09/1956 Age: 60 y.o. Gender: female  Primary Care Provider: Sanjuana LettersHENSEL,WILLIAM ARTHUR, MD Consultants: None Code Status: DNR  Chief Complaint: LE weakness  Assessment and Plan: Barbara [name] is a 60 y.o. female presenting with worsening LE weakness. PMH is significant for MS, HTN, spinal stenosis of lumbar region and cervical region, chronic venous stasis.   Worsening LE weakness in the setting of MS: Likely MS flare in setting of recent gastroenteritis. This si typical presentation for patient. At baseline able to transfer herself. This could also just be advancing disease state. Patient does have history of spinal stenosis of lumbar region; less concern for spinal compression syndrome at this time. MR total spine with chronic findings however unable to give contrast to view spinal cord due to patient not tolerating MRI. Not on MS medication currently due to financial difficulties without insurance. Was on Copaxin in 2014. [facility]. Takes chronic prednisone. Usually can get over episode and return to baseline. Clinically stable. - admit to teaching service, med-surg, attending Dr. Deirdre Priesthambliss - s/p IV solumedrol 125mg  in ED;  - continue steroids. For MS flare dosing of solumedrol would be 500mg  PO for 5 days  -consider need to consult neurology in the AM; hold off at this time - continued home Robaxin 1,500mg  TID  - continue home Gabapentin 300mg  TID  - continue home ASA 325mg  daily - PT/OT consult  HTN: Normotensive on admission. Takes Lisinopril at home.  - holding Lisinopril due to possible need for contrast imaging - can add medication as needed for elevated pressures  Chronic leukocytosis. On admission WBC 11.8. Takes chronic steriods which is most likely reason for  elevation. No clinical signs of infection. Afebrile.  - will monitor with labs  Decubitus Ulcers. New for patient. Has been less mobile over the last 3 weeks. Pain in gluteal area.  -would care consulted -frequent position changes to relieve pressure  Type 2 diabetes. New diagnosis for patient in setting of chronic steroid use and obesity. Takes metformin 1000mg  BID at home. Last A1c 03/2016 was 10.3.  -hold metformin -rSSI ordered -need to monitor sugars in setting of increased steroids -CBG checks AC+HS  Incontinence. Chronic issue -continue oxybutynin  Chronic Venous Stasis: Multiple blisters on bilateral legs. No signs of infection. Takes Lasix prn for LE edema. - add back Lasix as needed for edema - wound consult as above -will likely need unna boots  Chronic Pain. Has known leg spasms and chronic back pain.  H/o spondylosis and stenosis. -continue home tramadol   FEN/GI: HH/carb modified diet/ SLIV/ protonix Prophylaxis: Lovenox  Disposition: Admit to FPTS; pending clinical improvement  History of Present Illness:  Barbara [name] is a 60 y.o. female presenting with LE weakness. PMH significiant for MS. Patient states that starting around November 2 she started having worseing of her LE weakness. At baseline, patient lives independently and able to transfer herself. She is wheelchair bound at baseline. Starting around Nov 2 patient endorses some n/v/d. She presented to ED on 11/10 with these symtpoms and was given IVF and antiemetics with improvement. Patient states she had a total of about 2 weeks of symptoms. Usually when she gets any type of illness her MS flares up. Since the stomach virus patient has had the LE weakness. She increased her prednisone dose to 40mg  without improvement. Due to weakness patient  has been unable to transfer. She is complaining of bottom pain from what she believes are sores from lying in bed without moving for so long.   Review Of Systems: Per HPI  with the following additions:   Review of Systems  Constitutional: Negative for fever.  Respiratory: Negative for shortness of breath.   Cardiovascular: Negative for chest pain.  Gastrointestinal: Negative for abdominal pain, diarrhea, nausea and vomiting.    Patient Active Problem List   Diagnosis Date Noted  . Impaired mobility and activities of daily living 03/07/2016  . Palliative care encounter 03/07/2016  . Current chronic use of systemic steroids 03/07/2016  . DM type 2, not at goal [facility]) 03/06/2016  . Cellulitis and abscess of leg 08/28/2015  . Weakness of both lower extremities   . Absence of bladder continence   . Essential hypertension   . Spastic paraplegia (HCC)   . Weakness 07/12/2015  . Encounter for chronic pain management 04/21/2014  . Left hip pain 11/09/2013  . Right hip pain 06/09/2013  . Chronic venous insufficiency 11/05/2012  . Ankle pain 05/18/2012  . Spinal stenosis in cervical region 06/03/2011  . Multiple sclerosis (HCC) 12/07/2010  . 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 Spinal stenosis of lumbar region 12/07/2010  . Hypertension 12/07/2010  . Obesity 12/07/2010  . Chronic gout 12/07/2010    Past Medical History: Past Medical History:  Diagnosis Date  . Gout   . Lower back pain   . Lumbar spondylosis   . MS (multiple sclerosis) (HCC)   . Numbness   . Spinal stenosis   . Weakness     Past Surgical History: Past Surgical History:  Procedure Laterality Date  . CERVICAL DISC SURGERY     Qusetions C5-6 C6-7 and C7-T1    Social History: Social History  Substance Use Topics  . Smoking status: Never Smoker  . Smokeless tobacco: Never Used  . Alcohol use No   Additional social history: Lives alone with multiple assistive devices to help mobilize. Has sister who comes in and out to assist. Please also refer to relevant sections of EMR.  Family History: Family History  Problem Relation Age of Onset  . Heart disease Mother   . Diabetes  Mother   . Cancer Father     Allergies and Medications: No Known Allergies No current facility-administered medications on file prior to encounter.    Current Outpatient Prescriptions on File Prior to Encounter  Medication Sig Dispense Refill  . aspirin EC 325 MG tablet Take 325 mg by mouth daily.    . calcium carbonate (TUMS - DOSED IN MG ELEMENTAL CALCIUM) 500 MG chewable tablet Chew 1 tablet by mouth as needed for indigestion or heartburn.    . calcium-vitamin D (OSCAL WITH D) 500-200 MG-UNIT per tablet Take 1 tablet by mouth daily.      . Cholecalciferol (VITAMIN D3) 1000 units CAPS Take 4,000 Units by mouth daily.    . Coenzyme Q10 (CO Q 10 PO) Take 1 tablet by mouth daily.    . colchicine 0.6 MG tablet Take 0.6 mg by mouth daily as needed (gout).     . Cyanocobalamin (VITAMIN B-12 PO) Take 1 tablet by mouth daily.    . diazepam (VALIUM) 5 MG tablet TAKE 1 TABLET BY MOUTH EVERY 6 HOURS AS NEEDED FOR LEG SPASMS 90 tablet 5  . dicyclomine (BENTYL) 20 MG tablet Take 1 tablet (20 mg total) by mouth 3 (three) times daily as needed for spasms (abdominal pain).  20 tablet 0  . doxycycline (VIBRA-TABS) 100 MG tablet Take 1 tablet (100 mg total) by mouth 2 (two) times daily. (Patient not taking: Reported on 03/15/2016) 20 tablet 0  . furosemide (LASIX) 20 MG tablet TAKE 1 TABLET BY MOUTH ONCE DAILY (Patient taking differently: TAKE 20 MG BY MOUTH ONCE DAILY) 90 tablet 3  . gabapentin (NEURONTIN) 300 MG capsule Take 1 capsule (300 mg total) by mouth 3 (three) times daily. 270 capsule 4  . lisinopril (PRINIVIL,ZESTRIL) 10 MG tablet TAKE 1 TABLET BY MOUTH ONCE DAILY (Patient taking differently: TAKE 20 MG BY MOUTH ONCE DAILY) 90 tablet 3  . Magnesium 250 MG TABS Take 250 mg by mouth daily.    . metFORMIN (GLUCOPHAGE) 1000 MG tablet One half tab two times a day for one week then one full tab twice a day thereafter. (Patient taking differently: Take 500-1,000 mg by mouth See admin instructions. Take  500 mg by mouth twice daily for 1 week then take 1000 mg by mouth twice daily) 180 tablet 3  . methocarbamol (ROBAXIN) 750 MG tablet TAKE 2 TABLETS (1,500 MG TOTAL) BY MOUTH 3 (THREE) TIMES DAILY. (Patient taking differently: Take 750 mg by mouth 6 (six) times daily. ) 540 tablet 3  . metoCLOPramide (REGLAN) 10 MG tablet Take 1 tablet (10 mg total) by mouth every 6 (six) hours as needed for nausea or vomiting. 20 tablet 0  . Multiple Vitamins-Minerals (MULTIVITAMIN PO) Take 1 tablet by mouth daily.    Marland Kitchen OVER THE COUNTER MEDICATION Take 2 drops by mouth daily. Hemp Extract    . oxybutynin (DITROPAN-XL) 5 MG 24 hr tablet Take 1 tablet (5 mg total) by mouth at bedtime. 90 tablet 4  . pantoprazole (PROTONIX) 20 MG tablet Take 1 tablet (20 mg total) by mouth daily. (Patient not taking: Reported on 03/15/2016) 30 tablet 0  . predniSONE (DELTASONE) 10 MG tablet Take 3 tablets (30 mg total) by mouth daily with breakfast. (Patient taking differently: Take 60 mg by mouth daily with breakfast. ) 270 tablet 3  . traMADol (ULTRAM) 50 MG tablet TAKE 1 TABLET BY MOUTH 4 TIMES DAILY (Patient taking differently: TAKE 50 MG BY MOUTH 4 TIMES DAILY) 360 tablet PRN  . trolamine salicylate (ASPERCREME) 10 % cream Apply 1 application topically as needed for muscle pain.      Objective: BP 139/87   Pulse 92   Temp 97.8 F (36.6 C) (Oral)   Resp 16   Ht 5\' 3"  (1.6 m)   Wt 270 lb (122.5 kg)   LMP 10/07/2010   SpO2 96%   BMI 47.83 kg/m  Exam: General: NAD, lying in bed, obese WF, pleasant Eyes: EOMI, PERRL ENTM: MMM, oropharynx clear Neck: supple, normal ROM Cardiovascular: RRR, no m/r/g Respiratory: normal effort, CTAB Abdomen: soft, NT, ND MSK: LE- skin changes from chronic venous insufficiency, multiple sores on bilateral legs from opened blisters (mainly on heels). No increased warmth, pus, or tenderness to palpation. Pitting edema.  Skin: warm and dry. Has ecchymosis on back. Several decubitus ulcers  noted on bileteral buttocks ranging from grade 1 -2. Also grade 1 ulcers on posterior upper thighs bilaterally. Missing left great toe toenail. Neuro: CN 2-12 grossly, strength 5/5 in upper extremities bilaterally, sensation intact to light touch in upper extremities. LE: 3/5 strength in lower extremities bilaterally; sensation to light touch decreased in both LE (R>L). Psych: Mood and affect euthymic, normal rate and volume of speech   Labs and Imaging: Results for orders  placed or performed during the hospital encounter of 04/01/16 (from the past 24 hour(s))  CBC     Status: Abnormal   Collection Time: 04/01/16  8:57 PM  Result Value Ref Range   WBC 11.8 (H) 4.0 - 10.5 K/uL   RBC 4.76 3.87 - 5.11 MIL/uL   Hemoglobin 14.1 12.0 - 15.0 g/dL   HCT 40.9 81.1 - 91.4 %   MCV 89.5 78.0 - 100.0 fL   MCH 29.6 26.0 - 34.0 pg   MCHC 33.1 30.0 - 36.0 g/dL   RDW 78.2 (H) 95.6 - 21.3 %   Platelets 198 150 - 400 K/uL  Comprehensive metabolic panel     Status: Abnormal   Collection Time: 04/01/16  8:57 PM  Result Value Ref Range   Sodium 136 135 - 145 mmol/L   Potassium 3.7 3.5 - 5.1 mmol/L   Chloride 100 (L) 101 - 111 mmol/L   CO2 26 22 - 32 mmol/L   Glucose, Bld 227 (H) 65 - 99 mg/dL   BUN 30 (H) 6 - 20 mg/dL   Creatinine, Ser 0.86 (H) 0.44 - 1.00 mg/dL   Calcium 9.6 8.9 - 57.8 mg/dL   Total Protein 6.8 6.5 - 8.1 g/dL   Albumin 3.3 (L) 3.5 - 5.0 g/dL   AST 36 15 - 41 U/L   ALT 37 14 - 54 U/L   Alkaline Phosphatase 66 38 - 126 U/L   Total Bilirubin 0.7 0.3 - 1.2 mg/dL   GFR calc non Af Amer 56 (L) >60 mL/min   GFR calc Af Amer >60 >60 mL/min   Anion gap 10 5 - 15   Mr Cervical/Thoracic/Lumbar Spine Wo Contrast  Result Date: 04/02/2016 IMPRESSION: 1. The examination is degraded by motion, particularly the axial imaging of the cervical spine. 2. Multiple lesions within the cervical and thoracic spinal cord. The cervical lesions appear to be unchanged. Comparison of the thoracic spine is  limited by the lack of axial imaging on the prior examination. 3. No expansile lesions. Otherwise, evaluation for active demyelination is limited in the absence of IV contrast material. 4. Moderate to severe spinal canal stenosis at L4-L5 and L5-S1 with severe right L4-5 and left L5-S1 foraminal stenosis. This is unchanged compared to the examination of 05/05/2015. 5. Please note differences in numbering of levels between this examination and the lumbar spine MRI 05/05/2015. For the purposes of this dictation, I consider the lowest disc space to be a rudimentary S1-S2 disc. This is with the benefit of counting 7 cervical and 12 thoracic vertebral bodies. Electronically Signed   By: Deatra Robinson M.D.   On: 04/02/2016 01:21    Pincus Large, DO 04/02/2016, 2:12 AM PGY-3, [place] [facility] FPTS Intern pager: [phone], text pages welcome

## 2016-04-02 NOTE — Progress Notes (Signed)
Physical Therapy Evaluation Patient Details Name: Barbara [name] MRN: [id number] DOB: 07/06/1955 Today's Date: 04/02/2016   History of Present Illness  Pt is a 60 y.o. female presenting with LE weakness. PMH significiant for MS. Patient states that starting around November 2 she started having worseing of her LE weakness. At baseline, patient lives independently and able to transfer herself. She is wheelchair bound at baseline. Starting around Nov 2 patient endorses some n/v/d. She presented to ED on 11/10 with these symtpoms and was given IVF and antiemetics with improvement. Patient states she had a total of about 2 weeks of symptoms. Usually when she gets any type of illness her MS flares up. Since the stomach virus patient has had the LE weakness.   Clinical Impression  PTA, pt was independent with transfers and ADLs, and was able to independently mobilize around her home. Pt was able to transfer from wheelchair to lift chair and is unable to physically stand at this time. Pt's sister will be able to help at home, but will likely be unable to physically assist her to stand at this time. Pt is currently able to initiate motion to participate in exercises, but lacks the strength to support her in standing. Pt would be a good candidate for SNF to maximize strength and mobility gains before returning to home.      Follow Up Recommendations SNF    Equipment Recommendations  None recommended by PT    Recommendations for Other Services       Precautions / Restrictions Precautions Precautions: None Restrictions Weight Bearing Restrictions: No      Mobility  Bed Mobility Overal bed mobility: Needs Assistance Bed Mobility: Rolling;Sidelying to Sit;Sit to Sidelying Rolling: Mod assist Sidelying to sit: Mod assist     Sit to sidelying: Mod assist General bed mobility comments: Pt requries mod assist for bed mobility to lift B LEs into and out of bed  Transfers                    Ambulation/Gait                Stairs            Wheelchair Mobility    Modified Rankin (Stroke Patients Only)       Balance Overall balance assessment: Needs assistance Sitting-balance support: No upper extremity supported;Feet unsupported Sitting balance-Leahy Scale: Good Sitting balance - Comments: Pt able to sit EOB to eat meal                                     Pertinent Vitals/Pain Pain Assessment: 0-10 Pain Score: 5  Pain Location: B legs Pain Descriptors / Indicators: Aching;Burning Pain Intervention(s): Monitored during session;Repositioned;Limited activity within patient's tolerance    Home Living Family/patient expects to be discharged to:: Private residence Living Arrangements: Alone Available Help at Discharge: Family;Available PRN/intermittently (sister willing to stay for a while; other siblings checking ) Type of Home: House Home Access:  (hydraulic lift)     Home Layout: One level Home Equipment: Grab bars - toilet;Toilet riser;Grab bars - tub/shower;Wheelchair - [employer] Corporationmanual;Walker - 2 wheels;Tub bench;Other (comment) (in process of getting power wheelchair; hydraulic lift)lift chair      Prior Function Level of Independence: Independent with assistive device(s)         Comments: Able to perform all ADLs/IADLs independently PTA. Able to transfer independently  Hand Dominance   Dominant Hand: Left    Extremity/Trunk Assessment   Upper Extremity Assessment: Overall WFL for tasks assessed           Lower Extremity Assessment: Generalized weakness (pt able to initiate motion; 1/5 strength B LEs)         Communication   Communication: No difficulties  Cognition Arousal/Alertness: Awake/alert Behavior During Therapy: WFL for tasks assessed/performed Overall Cognitive Status: Within Functional Limits for tasks assessed                      General Comments      Exercises General Exercises -  Lower Extremity Ankle Circles/Pumps: AROM;Both;5 reps;Supine (Pt able to initiate motion. ) Heel Slides: AROM;Both;10 reps;Supine (pt able to intiate motion; exercise performed with PT assist) Straight Leg Raises: AROM;10 reps;Both;Supine (pt able to intiate motion)   Assessment/Plan    PT Assessment Patient needs continued PT services  PT Problem List Decreased strength;Decreased range of motion;Decreased activity tolerance;Decreased mobility;Decreased safety awareness;Decreased balance          PT Treatment Interventions Therapeutic activities;Therapeutic exercise;Neuromuscular re-education;Patient/family education;Functional mobility training;Wheelchair mobility training    PT Goals (Current goals can be found in the Care Plan section)  Acute Rehab PT Goals Patient Stated Goal: to return back to baseline PT Goal Formulation: With patient Time For Goal Achievement: 04/16/16 Potential to Achieve Goals: Fair    Frequency Min 3X/week   Barriers to discharge        Co-evaluation               End of Session   Activity Tolerance: Patient tolerated treatment well Patient left: in bed;with call bell/phone within reach Nurse Communication: Mobility status         Time: 9672-8979 PT Time Calculation (min) (ACUTE ONLY): 43 min   Charges:   PT Evaluation $PT Eval High Complexity: 1 Procedure PT Treatments $Therapeutic Exercise: 8-22 mins $Therapeutic Activity: 8-22 mins   PT G Codes:        Gaye Pollack 2016/04/26, 11:09 AM Gaye Pollack, SPT [phone]  I have read, reviewed and agree with student's note.   [facility] Acute Rehabilitation [phone] (pager)

## 2016-04-02 NOTE — ED Notes (Signed)
Patient returned to room and placed back on monitor. Provided with ice water and repositioned in bed.

## 2016-04-03 LAB — CBC
HEMATOCRIT: 39.1 % (ref 36.0–46.0)
Hemoglobin: 12.9 g/dL (ref 12.0–15.0)
MCH: 29.8 pg (ref 26.0–34.0)
MCHC: 33 g/dL (ref 30.0–36.0)
MCV: 90.3 fL (ref 78.0–100.0)
PLATELETS: 159 10*3/uL (ref 150–400)
RBC: 4.33 MIL/uL (ref 3.87–5.11)
RDW: 17.1 % — AB (ref 11.5–15.5)
WBC: 8.9 10*3/uL (ref 4.0–10.5)

## 2016-04-03 LAB — BASIC METABOLIC PANEL
Anion gap: 12 (ref 5–15)
BUN: 26 mg/dL — AB (ref 6–20)
CHLORIDE: 101 mmol/L (ref 101–111)
CO2: 25 mmol/L (ref 22–32)
CREATININE: 1 mg/dL (ref 0.44–1.00)
Calcium: 9.3 mg/dL (ref 8.9–10.3)
GFR calc Af Amer: >60 mL/min (ref >60)
GFR calc non Af Amer: >60 mL/min (ref >60)
GLUCOSE: 348 mg/dL — AB (ref 65–99)
POTASSIUM: 4.6 mmol/L (ref 3.5–5.1)
SODIUM: 138 mmol/L (ref 135–145)

## 2016-04-03 LAB — GLUCOSE, CAPILLARY
GLUCOSE-CAPILLARY: 371 mg/dL — AB (ref 65–99)
GLUCOSE-CAPILLARY: 327 mg/dL — AB (ref 65–99)
Glucose-Capillary: 328 mg/dL — ABNORMAL HIGH (ref 65–99)
Glucose-Capillary: 304 mg/dL — ABNORMAL HIGH (ref 65–99)

## 2016-04-03 MED ORDER — ACETAMINOPHEN 325 MG PO TABS
650.0000 mg | ORAL_TABLET | Freq: Four times a day (QID) | ORAL | Status: DC | PRN
  Administered 2016-04-03: 650 mg via ORAL
  Filled 2016-04-03: qty 2

## 2016-04-03 MED ORDER — IBUPROFEN 400 MG PO TABS
400.0000 mg | ORAL_TABLET | Freq: Every day | ORAL | Status: DC | PRN
  Administered 2016-04-03: 400 mg via ORAL
  Filled 2016-04-03: qty 1

## 2016-04-03 MED ORDER — FUROSEMIDE 20 MG PO TABS
20.0000 mg | ORAL_TABLET | Freq: Every day | ORAL | Status: DC
  Administered 2016-04-03 – 2016-04-05 (×3): 20 mg via ORAL
  Filled 2016-04-03 (×3): qty 1

## 2016-04-03 MED ORDER — LISINOPRIL 10 MG PO TABS
10.0000 mg | ORAL_TABLET | Freq: Every day | ORAL | Status: DC
  Administered 2016-04-03 – 2016-04-05 (×3): 10 mg via ORAL
  Filled 2016-04-03 (×3): qty 1

## 2016-04-03 MED ORDER — INSULIN GLARGINE 100 UNIT/ML ~~LOC~~ SOLN
15.0000 [IU] | Freq: Every day | SUBCUTANEOUS | Status: DC
  Administered 2016-04-03: 15 [IU] via SUBCUTANEOUS
  Filled 2016-04-03 (×2): qty 0.15

## 2016-04-03 MED ORDER — ADULT MULTIVITAMIN W/MINERALS CH
1.0000 | ORAL_TABLET | Freq: Every day | ORAL | Status: DC
  Administered 2016-04-03 – 2016-04-05 (×3): 1 via ORAL
  Filled 2016-04-03 (×3): qty 1

## 2016-04-03 MED ORDER — PRO-STAT SUGAR FREE PO LIQD
30.0000 mL | Freq: Every day | ORAL | Status: DC
  Administered 2016-04-03 – 2016-04-05 (×3): 30 mL via ORAL
  Filled 2016-04-03 (×3): qty 30

## 2016-04-03 NOTE — Evaluation (Addendum)
Occupational Therapy Evaluation Patient Details Name: Barbara [name] MRN: [id number] DOB: 10/16/1955 Today's Date: 04/03/2016    History of Present Illness Pt is a 60 y.o. female presenting with LE weakness. PMH significiant for MS. Patient states that starting around November 2 she started having worseing of her LE weakness. At baseline, patient lives independently and able to transfer herself. She is wheelchair bound at baseline. Starting around Nov 2 patient endorses some n/v/d. She presented to ED on 11/10 with these symtpoms and was given IVF and antiemetics with improvement. Patient states she had a total of about 2 weeks of symptoms. Usually when she gets any type of illness her MS flares up. Since the stomach virus patient has had the LE weakness.    Clinical Impression   PTA, pt was able to transfer independently and complete ADL independently. She reports that she does have assistance from family for home care. Pt currently requires mod assist for seated UB ADL and max assist for LB ADL at bed level. Pt unable to stand at this time and requires significant assistance for functional bed mobility to participate in ADL. Pt also reports decreased strength in her R UE limiting ability to complete hair brushing tasks this session. Pt lives alone but does have family willing to come assist her as needed. Pt would be a good candidate for short-term SNF placement to improve independence with ADL and maximize PLOF. Pt would benefit from continued OT services while admitted to improve strength and activity tolerance as a precursor to increased ADL independence. OT will continue to follow acutely.    Follow Up Recommendations  SNF;Supervision/Assistance - 24 hour    Equipment Recommendations  Other (comment) (TBD at next venue of care.)    Recommendations for Other Services       Precautions / Restrictions Precautions Precautions: None Restrictions Weight Bearing Restrictions: No       Mobility Bed Mobility Overal bed mobility: Needs Assistance Bed Mobility: Rolling;Sidelying to Sit;Sit to Sidelying Rolling: Mod assist Sidelying to sit: Mod assist     Sit to sidelying: Max assist General bed mobility comments: Mod assist for bed mobility to sit at EOB to lift legs. Max assist to return to supine. Max assist +2 for scooting up in bed.  Transfers                 General transfer comment: Unable to attempt at this time    Balance                                            ADL Overall ADL's : Needs assistance/impaired Eating/Feeding: Set up;Bed level   Grooming: Sitting;Moderate assistance Grooming Details (indicate cue type and reason): Limited R shoulder ROM limiting ability to brush hair. Reports able to brush teeth independently. Upper Body Bathing: Maximal assistance;Sitting   Lower Body Bathing: Bed level;Maximal assistance   Upper Body Dressing : Maximal assistance;Sitting   Lower Body Dressing: Maximal assistance;Bed level               Functional mobility during ADLs:  (Unable to stand this session) General ADL Comments: Pt able to complete seated ADL with assistance. Encouraged her to participate with nursing staff during ADl as much as possible.     Vision Vision Assessment?: No apparent visual deficits   Perception     Praxis  Pertinent Vitals/Pain Pain Assessment: Faces Faces Pain Scale: Hurts little more Pain Location: B LE; L hip/buttocks Pain Descriptors / Indicators: Discomfort Pain Intervention(s): Limited activity within patient's tolerance;Monitored during session;Repositioned     Hand Dominance Left   Extremity/Trunk Assessment Upper Extremity Assessment Upper Extremity Assessment: Generalized weakness   Lower Extremity Assessment Lower Extremity Assessment: Generalized weakness (Unable to stand)       Communication Communication Communication: No difficulties   Cognition  Arousal/Alertness: Awake/alert Behavior During Therapy: WFL for tasks assessed/performed Overall Cognitive Status: Within Functional Limits for tasks assessed                     General Comments       Exercises Exercises: General Upper Extremity     Shoulder Instructions      Home Living Family/patient expects to be discharged to:: Private residence Living Arrangements: Alone Available Help at Discharge: Other (Comment);Family;Available PRN/intermittently (sister able to assist 24 hours per day) Type of Home: House Home Access:  (lift chair)     Home Layout: One level     Bathroom Shower/Tub: Tub/shower unit (tub bench) Shower/tub characteristics: Engineer, building services: Handicapped height Bathroom Accessibility: Yes How Accessible: Accessible via wheelchair Home Equipment: Grab bars - toilet;Toilet riser;Grab bars - tub/shower;Wheelchair - [employer] - 2 wheels;Tub bench;Other (comment) (In process to get power wheelchair)          Prior Functioning/Environment Level of Independence: Independent with assistive device(s)        Comments: Able to perform all ADLs/IADLs independently PTA. Able to transfer independently         OT Problem List: Decreased strength;Decreased range of motion;Decreased activity tolerance;Impaired balance (sitting and/or standing);Decreased safety awareness;Decreased knowledge of use of DME or AE;Decreased knowledge of precautions;Pain;Impaired UE functional use   OT Treatment/Interventions: Self-care/ADL training;Therapeutic exercise;Energy conservation;Therapeutic activities;Patient/family education;Balance training    OT Goals(Current goals can be found in the care plan section) Acute Rehab OT Goals Patient Stated Goal: to return back to baseline OT Goal Formulation: With patient Time For Goal Achievement: 04/17/16 Potential to Achieve Goals: Good ADL Goals Pt Will Perform Upper Body Bathing: with supervision;sitting Pt  Will Perform Lower Body Bathing: with min guard assist;sitting/lateral leans;with adaptive equipment Pt Will Perform Upper Body Dressing: with supervision;sitting Pt Will Perform Lower Body Dressing: with min guard assist;sitting/lateral leans;with adaptive equipment Pt Will Transfer to Toilet: squat pivot transfer;with min guard assist;regular height toilet (from w/c) Pt Will Perform Toileting - Clothing Manipulation and hygiene: sitting/lateral leans;with min guard assist;with adaptive equipment Pt Will Perform Tub/Shower Transfer: Shower transfer;with mod assist;3 in 1;rolling walker;Squat pivot transfer  OT Frequency: Min 1X/week   Barriers to D/C:            Co-evaluation              End of Session Nurse Communication: Other (comment) (Redness and soreness in L hip/buttocks)  Activity Tolerance:   Patient left: in bed;with call bell/phone within reach   Time: 1215-1303 OT Time Calculation (min): 48 min Charges:  OT General Charges $OT Visit: 1 Procedure OT Evaluation $OT Eval Moderate Complexity: 1 Procedure OT Treatments $Self Care/Home Management : 23-37 mins  Doristine Section, OTR/L [phone] 04/03/2016, 2:02 PM

## 2016-04-03 NOTE — Progress Notes (Signed)
Family Medicine Teaching Service Daily Progress Note Intern Pager: [phone]  Patient name: Barbara [name] Medical record number: [id number] Date of birth: 20-Feb-1956 Age: 60 y.o. Gender: female  Primary Care Provider: Sanjuana Letters, MD Consultants: wound care Code Status: DNR  Pt Overview and Major Events to Date:  11/28- admitted to FPTS  Assessment and Plan: Barbara [name] is a 60 y.o. female presenting with worsening LE weakness. PMH is significant for MS, HTN, spinal stenosis of lumbar region and cervical region, chronic venous stasis.   LE weakness- likely secondary to MS flare after recent gastroenteritis. She reports this episode is exactly like her previous flares that have improved with IV steroids in past. MRI unchanged from prior studies.  -Continue IV 500mg  solumedrol for total of 3 days. -Cotinue home Robaxin 1500mg  TID, gabapentin 300mg  TID, ASA 325 daily -PT recommending SNF placement, following -care management consulted as patient has difficulty affording her medications -recommendation to discontinue maintenance steroids outpatient as these are likely causing more long time problems  Decubitus ulcers- new problem for patient, due to laying in bed for several weeks -wound care consulted, appreciate recommendations  -Foley in place due to urinary incontinence worsening sacral ulcers, plan to discontinue this prior to discharge  HTN- Takes lisinopril 10mg  daily at home. Blood pressure 154/86 this morning -lisinopril held at time of admission, will add back today -monitor blood pressure  Leukocytosis- likely due to chronic steroid use. WBC 8.9 today -continue to monitor  T2DM- takes metformin 1000 mg BID at home, held at time of admission. New onset in setting of daily prednisone use. Last A1C 10.3 (Nov 2017).  -monitor sugars AC/HS -rSSI -this morning glucose is 328 -diabetes book given to patient  Incontinence- chronic problem -continue  oxybuynin -Foley in place for new due to new ulcers in sacral area  Chronic venous stasis- -wound care following, appreciate recommendations -takes PO lasix daily at home, was held at time of admission, will add back today  Chronic pain- controlled on home regimen -k-pad prn -continue home tramadol -valium 5mg  every 6 hours as needed for muscle spams -gabapentin 300mg  TID  FEN/GI: heart healthy, carb modified diet, PPI PPx: lovenox  Disposition: pending PT/OT evaluation  Subjective:  Barbara [name] is doing well today. She does not feel an improvement in her leg weakness yet but is appreciative of care to her wounds as she has been worried about them.   Objective: Temp:  [97.7 F (36.5 C)-98.1 F (36.7 C)] 97.7 F (36.5 C) (11/29 0512) Pulse Rate:  [92-103] 94 (11/29 0512) Resp:  [18-19] 18 (11/29 0512) BP: (125-154)/(80-86) 154/86 (11/29 0512) SpO2:  [92 %-97 %] 92 % (11/29 0512) Physical Exam: General: morbidly obese lady, sitting up in bed eating breakfast, in NAD Cardiovascular: RRR, no MRG Respiratory: CTA in anterior lung fields no increased work of breathing Abdomen: obese abdomen, soft, non-tender, non-distended, +BS Extremities: wrapped bilaterally up to knees  Laboratory:  Recent Labs Lab 04/01/16 2057 04/02/16 0400 04/03/16 0558  WBC 11.8* 12.6* 8.9  HGB 14.1 14.1 12.9  HCT 42.6 40.9 39.1  PLT 198 177 159    Recent Labs Lab 04/01/16 2057 04/02/16 0400  NA 136  --   K 3.7  --   CL 100*  --   CO2 26  --   BUN 30*  --   CREATININE 1.07* 0.96  CALCIUM 9.6  --   PROT 6.8  --   BILITOT 0.7  --   ALKPHOS 66  --  ALT 37  --   AST 36  --   GLUCOSE 227*  --      Imaging/Diagnostic Tests: Mr Cervical Spine Wo Contrast  Result Date: 04/02/2016 CLINICAL DATA:  Multiple sclerosis with bilateral lower extremity weakness EXAM: MRI CERVICAL, THORACIC AND LUMBAR SPINE WITHOUT CONTRAST TECHNIQUE: Multiplanar and multiecho pulse sequences of the  cervical spine, to include the craniocervical junction and cervicothoracic junction, and thoracic and lumbar spine, were obtained without intravenous contrast. COMPARISON:  Cervical and thoracic spine MRI 07/15/2015, lumbar spine MRI 05/05/2015 FINDINGS: Despite efforts by the technologist and patient, motion artifact is present on today's examination and could not be eliminated. This reduces the sensitivity and specificity of the study. Particularly, the axial images are severely degraded. MRI CERVICAL SPINE FINDINGS Alignment: There is straightening of the normal cervical lordosis, unchanged. Vertebrae: There is anterior cervical fusion at C5-T1. Cord: Lesion within the right posterior aspect of the cord at the C2 level is unchanged. The other previously seen cord lesions cannot be clearly identified due to motion artifact. A lesion within the right aspect of the cord at C7 is unchanged. There is no cord expansion. No large new lesions are seen on the sagittal inversion recovery sequence. Posterior Fossa, vertebral arteries, paraspinal tissues: Visualized posterior fossa is normal. Vertebral artery flow voids are preserved. Normal visualized paraspinal soft tissues. Disc levels: Evaluation of the cervical disc spaces is severely degraded by motion artifact. There is no spinal canal stenosis. At the C5 level, there is a right subarticular disc/osteophyte the narrows the right ventral aspect of thecal sac. MRI THORACIC SPINE FINDINGS Alignment:  Physiologic Vertebrae: There is a hemangioma within the T3 vertebral body. No other focal marrow lesion. Cord: There is a T2 hyperintense lesion within the left aspect of the cord at the T5-T7 levels, unchanged. There are lesions within the right aspect of the cord at the T8 and T9 levels. A T2 hyperintense focus within the left aspect of the cord at the T11 level is favored to be artifactual. Paraspinal and other soft tissues: Negative. Disc levels: There is a disc bulge at  the T7-T8 level effacing the ventral thecal sac and contacting the anterior surface of the cord. MRI LUMBAR SPINE FINDINGS Segmentation: Rudimentary disc space at what is considered on this examination to be S1-S2. This numbering difference from that on the MRI of the lumbar spine performed 05/05/2015. Alignment:  Physiologic. Vertebrae: There is nonspecific heterogeneous marrow signal lumbar spine. Conus medullaris: Extends to the L2-3 level and appears normal. Paraspinal and other soft tissues: 1 cm left renal cyst. Otherwise unremarkable. Disc levels: At L3-L5, there is disc desiccation with small bulge. No spinal canal or neural foraminal stenosis. There is posterior decompression at this level. At L4-L5, there is a diffuse disc bulge with superimposed central protrusion and moderate bilateral facet hypertrophy. There is moderate to severe spinal canal stenosis. There is severe right and mild left neural foraminal stenosis. At L5-S1, there is a diffuse disc bulge and severe bilateral facet hypertrophy resulting in moderate to severe spinal canal stenosis. There is severe left and moderate right neural foraminal stenosis. IMPRESSION: 1. The examination is degraded by motion, particularly the axial imaging of the cervical spine. 2. Multiple lesions within the cervical and thoracic spinal cord. The cervical lesions appear to be unchanged. Comparison of the thoracic spine is limited by the lack of axial imaging on the prior examination. 3. No expansile lesions. Otherwise, evaluation for active demyelination is limited in the absence  of IV contrast material. 4. Moderate to severe spinal canal stenosis at L4-L5 and L5-S1 with severe right L4-5 and left L5-S1 foraminal stenosis. This is unchanged compared to the examination of 05/05/2015. 5. Please note differences in numbering of levels between this examination and the lumbar spine MRI 05/05/2015. For the purposes of this dictation, I consider the lowest disc space to  be a rudimentary S1-S2 disc. This is with the benefit of counting 7 cervical and 12 thoracic vertebral bodies. Electronically Signed   By: Deatra Robinson M.D.   On: 04/02/2016 01:21   Mr Thoracic Spine Wo Contrast  Result Date: 04/02/2016 CLINICAL DATA:  Multiple sclerosis with bilateral lower extremity weakness EXAM: MRI CERVICAL, THORACIC AND LUMBAR SPINE WITHOUT CONTRAST TECHNIQUE: Multiplanar and multiecho pulse sequences of the cervical spine, to include the craniocervical junction and cervicothoracic junction, and thoracic and lumbar spine, were obtained without intravenous contrast. COMPARISON:  Cervical and thoracic spine MRI 07/15/2015, lumbar spine MRI 05/05/2015 FINDINGS: Despite efforts by the technologist and patient, motion artifact is present on today's examination and could not be eliminated. This reduces the sensitivity and specificity of the study. Particularly, the axial images are severely degraded. MRI CERVICAL SPINE FINDINGS Alignment: There is straightening of the normal cervical lordosis, unchanged. Vertebrae: There is anterior cervical fusion at C5-T1. Cord: Lesion within the right posterior aspect of the cord at the C2 level is unchanged. The other previously seen cord lesions cannot be clearly identified due to motion artifact. A lesion within the right aspect of the cord at C7 is unchanged. There is no cord expansion. No large new lesions are seen on the sagittal inversion recovery sequence. Posterior Fossa, vertebral arteries, paraspinal tissues: Visualized posterior fossa is normal. Vertebral artery flow voids are preserved. Normal visualized paraspinal soft tissues. Disc levels: Evaluation of the cervical disc spaces is severely degraded by motion artifact. There is no spinal canal stenosis. At the C5 level, there is a right subarticular disc/osteophyte the narrows the right ventral aspect of thecal sac. MRI THORACIC SPINE FINDINGS Alignment:  Physiologic Vertebrae: There is a  hemangioma within the T3 vertebral body. No other focal marrow lesion. Cord: There is a T2 hyperintense lesion within the left aspect of the cord at the T5-T7 levels, unchanged. There are lesions within the right aspect of the cord at the T8 and T9 levels. A T2 hyperintense focus within the left aspect of the cord at the T11 level is favored to be artifactual. Paraspinal and other soft tissues: Negative. Disc levels: There is a disc bulge at the T7-T8 level effacing the ventral thecal sac and contacting the anterior surface of the cord. MRI LUMBAR SPINE FINDINGS Segmentation: Rudimentary disc space at what is considered on this examination to be S1-S2. This numbering difference from that on the MRI of the lumbar spine performed 05/05/2015. Alignment:  Physiologic. Vertebrae: There is nonspecific heterogeneous marrow signal lumbar spine. Conus medullaris: Extends to the L2-3 level and appears normal. Paraspinal and other soft tissues: 1 cm left renal cyst. Otherwise unremarkable. Disc levels: At L3-L5, there is disc desiccation with small bulge. No spinal canal or neural foraminal stenosis. There is posterior decompression at this level. At L4-L5, there is a diffuse disc bulge with superimposed central protrusion and moderate bilateral facet hypertrophy. There is moderate to severe spinal canal stenosis. There is severe right and mild left neural foraminal stenosis. At L5-S1, there is a diffuse disc bulge and severe bilateral facet hypertrophy resulting in moderate to severe spinal canal  stenosis. There is severe left and moderate right neural foraminal stenosis. IMPRESSION: 1. The examination is degraded by motion, particularly the axial imaging of the cervical spine. 2. Multiple lesions within the cervical and thoracic spinal cord. The cervical lesions appear to be unchanged. Comparison of the thoracic spine is limited by the lack of axial imaging on the prior examination. 3. No expansile lesions. Otherwise,  evaluation for active demyelination is limited in the absence of IV contrast material. 4. Moderate to severe spinal canal stenosis at L4-L5 and L5-S1 with severe right L4-5 and left L5-S1 foraminal stenosis. This is unchanged compared to the examination of 05/05/2015. 5. Please note differences in numbering of levels between this examination and the lumbar spine MRI 05/05/2015. For the purposes of this dictation, I consider the lowest disc space to be a rudimentary S1-S2 disc. This is with the benefit of counting 7 cervical and 12 thoracic vertebral bodies. Electronically Signed   By: Deatra RobinsonKevin  Herman M.D.   On: 04/02/2016 01:21   Mr Lumbar Spine Wo Contrast  Result Date: 04/02/2016 CLINICAL DATA:  Multiple sclerosis with bilateral lower extremity weakness EXAM: MRI CERVICAL, THORACIC AND LUMBAR SPINE WITHOUT CONTRAST TECHNIQUE: Multiplanar and multiecho pulse sequences of the cervical spine, to include the craniocervical junction and cervicothoracic junction, and thoracic and lumbar spine, were obtained without intravenous contrast. COMPARISON:  Cervical and thoracic spine MRI 07/15/2015, lumbar spine MRI 05/05/2015 FINDINGS: Despite efforts by the technologist and patient, motion artifact is present on today's examination and could not be eliminated. This reduces the sensitivity and specificity of the study. Particularly, the axial images are severely degraded. MRI CERVICAL SPINE FINDINGS Alignment: There is straightening of the normal cervical lordosis, unchanged. Vertebrae: There is anterior cervical fusion at C5-T1. Cord: Lesion within the right posterior aspect of the cord at the C2 level is unchanged. The other previously seen cord lesions cannot be clearly identified due to motion artifact. A lesion within the right aspect of the cord at C7 is unchanged. There is no cord expansion. No large new lesions are seen on the sagittal inversion recovery sequence. Posterior Fossa, vertebral arteries, paraspinal  tissues: Visualized posterior fossa is normal. Vertebral artery flow voids are preserved. Normal visualized paraspinal soft tissues. Disc levels: Evaluation of the cervical disc spaces is severely degraded by motion artifact. There is no spinal canal stenosis. At the C5 level, there is a right subarticular disc/osteophyte the narrows the right ventral aspect of thecal sac. MRI THORACIC SPINE FINDINGS Alignment:  Physiologic Vertebrae: There is a hemangioma within the T3 vertebral body. No other focal marrow lesion. Cord: There is a T2 hyperintense lesion within the left aspect of the cord at the T5-T7 levels, unchanged. There are lesions within the right aspect of the cord at the T8 and T9 levels. A T2 hyperintense focus within the left aspect of the cord at the T11 level is favored to be artifactual. Paraspinal and other soft tissues: Negative. Disc levels: There is a disc bulge at the T7-T8 level effacing the ventral thecal sac and contacting the anterior surface of the cord. MRI LUMBAR SPINE FINDINGS Segmentation: Rudimentary disc space at what is considered on this examination to be S1-S2. This numbering difference from that on the MRI of the lumbar spine performed 05/05/2015. Alignment:  Physiologic. Vertebrae: There is nonspecific heterogeneous marrow signal lumbar spine. Conus medullaris: Extends to the L2-3 level and appears normal. Paraspinal and other soft tissues: 1 cm left renal cyst. Otherwise unremarkable. Disc levels: At L3-L5, there  is disc desiccation with small bulge. No spinal canal or neural foraminal stenosis. There is posterior decompression at this level. At L4-L5, there is a diffuse disc bulge with superimposed central protrusion and moderate bilateral facet hypertrophy. There is moderate to severe spinal canal stenosis. There is severe right and mild left neural foraminal stenosis. At L5-S1, there is a diffuse disc bulge and severe bilateral facet hypertrophy resulting in moderate to severe  spinal canal stenosis. There is severe left and moderate right neural foraminal stenosis. IMPRESSION: 1. The examination is degraded by motion, particularly the axial imaging of the cervical spine. 2. Multiple lesions within the cervical and thoracic spinal cord. The cervical lesions appear to be unchanged. Comparison of the thoracic spine is limited by the lack of axial imaging on the prior examination. 3. No expansile lesions. Otherwise, evaluation for active demyelination is limited in the absence of IV contrast material. 4. Moderate to severe spinal canal stenosis at L4-L5 and L5-S1 with severe right L4-5 and left L5-S1 foraminal stenosis. This is unchanged compared to the examination of 05/05/2015. 5. Please note differences in numbering of levels between this examination and the lumbar spine MRI 05/05/2015. For the purposes of this dictation, I consider the lowest disc space to be a rudimentary S1-S2 disc. This is with the benefit of counting 7 cervical and 12 thoracic vertebral bodies. Electronically Signed   By: Deatra Robinson M.D.   On: 04/02/2016 01:21     Tillman Sers, DO 04/03/2016, 7:37 AM PGY-1,  Family Medicine FPTS Intern pager: [phone], text pages welcome

## 2016-04-03 NOTE — [place] FL2 (Signed)
[place] MEDICAID FL2 LEVEL OF CARE SCREENING TOOL     IDENTIFICATION  Patient Name: Barbara SolianSharron Dorin Birthdate: 12/01/1955 Sex: female Admission Date (Current Location): 04/01/2016  [facility] [place]  [employer]   Facility and Address:  [facility] [place], [address], [place], [place] [address]      Provider Number: [id number]  Attending Physician Name and Address:  Carney LivingMarshall L Chambliss, MD  Relative Name and Phone Number:       Current Level of Care: Hospital Recommended Level of Care: Skilled Nursing Facility Prior Approval Number:    Date Approved/Denied:   PASRR Number: [id number]  Discharge Plan: SNF    Current Diagnoses: Patient Active Problem List   Diagnosis Date Noted  . Lower extremity weakness 04/02/2016  . Decubitus ulcer of sacral region   . Impaired mobility and activities of daily living 03/07/2016  . Palliative care encounter 03/07/2016  . Current chronic use of systemic steroids 03/07/2016  . DM type 2, not at goal [facility](HCC) 03/06/2016  . Cellulitis and abscess of leg 08/28/2015  . Weakness of both lower extremities   . Absence of bladder continence   . Essential hypertension   . Spastic paraplegia (HCC)   . Weakness 07/12/2015  . Encounter for chronic pain management 04/21/2014  . Left hip pain 11/09/2013  . Right hip pain 06/09/2013  . Chronic venous insufficiency 11/05/2012  . Ankle pain 05/18/2012  . Spinal stenosis in cervical region 06/03/2011  . Multiple sclerosis (HCC) 12/07/2010  . 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 Spinal stenosis of lumbar region 12/07/2010  . Hypertension 12/07/2010  . Obesity 12/07/2010  . Chronic gout 12/07/2010    Orientation RESPIRATION BLADDER Height & Weight     Self, Time, Situation, Place  Normal Incontinent Weight: 271 lb 2.7 oz (123 kg) Height:  5\' 3"  (160 cm)  BEHAVIORAL SYMPTOMS/MOOD NEUROLOGICAL BOWEL NUTRITION STATUS   (None)  (None) Continent Diet (Heart healthy/carb  modified)  AMBULATORY STATUS COMMUNICATION OF NEEDS Skin   Total Care (Wheelchair bound at baseline) Verbally Other (Comment) (Cellulitis, MASD, Excoriated, Open/dehisced wound/incision both legs, left toe, both posterior thighs, coccyx.)                       Personal Care Assistance Level of Assistance  Bathing, Feeding, Dressing Bathing Assistance: Maximum assistance Feeding assistance: Limited assistance Dressing Assistance: Maximum assistance     Functional Limitations Info  Sight, Hearing, Speech Sight Info: Impaired (Blind right eye) Hearing Info: Adequate Speech Info: Adequate    SPECIAL CARE FACTORS FREQUENCY  PT (By licensed PT), Blood pressure, Diabetic urine testing, OT (By licensed OT)     PT Frequency: 5 x week OT Frequency: 5 x week            Contractures Contractures Info: Not present    Additional Factors Info  Code Status, Allergies Code Status Info: DNR Allergies Info: NKDA           Current Medications (04/03/2016):  This is the current hospital active medication list Current Facility-Administered Medications  Medication Dose Route Frequency Provider Last Rate Last Dose  . acetaminophen (TYLENOL) tablet 650 mg  650 mg Oral Q6H PRN Tillman SersAngela C Riccio, DO   650 mg at 04/03/16 1007  . aspirin EC tablet 325 mg  325 mg Oral Daily Pincus LargeJazma Y Phelps, DO   325 mg at 04/03/16 0949  . collagenase (SANTYL) ointment   Topical Daily Carney LivingMarshall L Chambliss, MD      .  diazepam (VALIUM) tablet 5 mg  5 mg Oral Q6H PRN Pincus Large, DO   5 mg at 04/02/16 1442  . enoxaparin (LOVENOX) injection 40 mg  40 mg Subcutaneous Q24H Pincus Large, DO   40 mg at 04/03/16 0949  . furosemide (LASIX) tablet 20 mg  20 mg Oral Daily Tillman Sers, DO   20 mg at 04/03/16 0950  . gabapentin (NEURONTIN) capsule 300 mg  300 mg Oral TID Pincus Large, DO   300 mg at 04/03/16 0949  . insulin aspart (novoLOG) injection 0-20 Units  0-20 Units Subcutaneous TID WC Pincus Large, DO    15 Units at 04/03/16 1358  . insulin aspart (novoLOG) injection 0-5 Units  0-5 Units Subcutaneous QHS Pincus Large, DO      . insulin glargine (LANTUS) injection 15 Units  15 Units Subcutaneous Daily Tillman Sers, DO   15 Units at 04/03/16 1408  . lisinopril (PRINIVIL,ZESTRIL) tablet 10 mg  10 mg Oral Daily Tillman Sers, DO   10 mg at 04/03/16 0950  . methocarbamol (ROBAXIN) tablet 1,500 mg  1,500 mg Oral TID Pincus Large, DO   1,500 mg at 04/03/16 0950  . methylPREDNISolone sodium succinate (SOLU-MEDROL) 500 mg in sodium chloride 0.9 % 50 mL IVPB  500 mg Intravenous Q12H Angela C Riccio, DO   500 mg at 04/03/16 1007  . metoCLOPramide (REGLAN) tablet 10 mg  10 mg Oral Q6H PRN Pincus Large, DO      . oxybutynin (DITROPAN-XL) 24 hr tablet 5 mg  5 mg Oral QHS Pincus Large, DO   5 mg at 04/02/16 2251  . pantoprazole (PROTONIX) EC tablet 40 mg  40 mg Oral Daily Pincus Large, DO   40 mg at 04/03/16 0949  . traMADol (ULTRAM) tablet 50 mg  50 mg Oral Q6H Pincus Large, DO   50 mg at 04/03/16 1358     Discharge Medications: Please see discharge summary for a list of discharge medications.  Relevant Imaging Results:  Relevant Lab Results:   Additional Information SS#: [id number]. Will need air mattress.  Margarito Liner, LCSW

## 2016-04-03 NOTE — Progress Notes (Addendum)
Inpatient Diabetes Program Recommendations  AACE/ADA: New Consensus Statement on Inpatient Glycemic Control (2015)  Target Ranges:  Prepandial:   less than 140 mg/dL      Peak postprandial:   less than 180 mg/dL (1-2 hours)      Critically ill patients:  140 - 180 mg/dL   Results for Barbara [name], Barbara [name] (MRN [id number]) as of 04/03/2016 11:20  Ref. Range 04/02/2016 08:32 04/02/2016 11:42 04/02/2016 17:13 04/02/2016 21:46 04/03/2016 07:41  Glucose-Capillary Latest Ref Range: 65 - 99 mg/dL 009 (H) 381 (H) 829 (H) 164 (H) 328 (H)   Review of Glycemic Control  Diabetes history: DM2 (dx 03/06/16) Outpatient Diabetes medications: Metformin 1000 mg BID Current orders for Inpatient glycemic control: Novolog 0-20 units TID with meals, Novolog 0-5 units QHS  Inpatient Diabetes Program Recommendations: Insulin - Basal: Please consider ordering low dose basal insulin. Glucose elevated. Recommend starting with Lantus 12 units daily (based on 123 kg x 0.1 units). HgbA1C: Newly dx with DM2 on 03/06/16 with A1C of 10.3% indicating an average glucose of 249 mg/dl over the past 2-3 months. Patient is chronically on steroids for MS.   Thanks,  Christena Deem RN, MSN, [facility] Inpatient Diabetes Coordinator Team Pager [phone] (8a-5p)

## 2016-04-03 NOTE — Progress Notes (Signed)
Initial Nutrition Assessment  DOCUMENTATION CODES:   Morbid obesity  INTERVENTION:   -30 ml Prostat daily, each supplement provides 100 kcals and 15 grams protein -MVI daily -Reinforced education on DM diet  NUTRITION DIAGNOSIS:   Increased nutrient needs related to wound healing as evidenced by estimated needs.  GOAL:   Patient will meet greater than or equal to 90% of their needs  MONITOR:   PO intake, Supplement acceptance, Labs, Weight trends, Skin, I & O's  REASON FOR ASSESSMENT:   Consult Diet education  ASSESSMENT:   Barbara [name] is a 60 y.o. female presenting with worsening LE weakness. PMH is significant for MS, HTN, spinal stenosis of lumbar region and cervical region, chronic venous stasis.   Pt admitted with LE weakness.   Spoke with pt at bedside. She reports that she has a decreased appetite PTA for a few days related to a GI bug and intentionally restricted intake to prevent numerous "accidents". She typically has a good appetite, however, intake is limited to food provided by friends and family members and groceries that are able to be delivered to her home (admits to lacking fresh fruits and vegetables). Pt expresses her desire to be as independent as possible, but shares that food preparation is a barrier related to being confirmed to a motorized wheelchair.   Pt reports she was visited by DM coordinator yesterday and watched DM videos. Pt had very thoughtful questions and was motivated to control DM to the best of her ability. She acknowledged barriers such as her obesity, steroid dependence, and limited mobility. Education was focused on ways she could independently positively impact DM management. RD emphasized portion control and healthful food substitutions. Also discussed low calorie beverages and importance of self-management prartices to prevent further complications. Pt was able to teach back basic concepts presented with DM coordinator and DM  educational videos. She also expressed intent to review "Living Well With Diabetes" booklet.   RD dicussed importance of optimal glycemic control for wound healing and discussed ways pt could increase protein in diet. Teach back method used. Expect fair to good compliance.   RD did not perform physical exam due to limited mobility as a result of MS, as well as pt's extremities wrapped in bandages due to wound healing.   Case discussed with RN. Plan to d/c to SNF at discharge.   Labs reviewed: CBGS: 164-328.  Diet Order:  Diet heart healthy/carb modified Room service appropriate? Yes; Fluid consistency: Thin  Skin:  Wound (see comment) (st II rt heel, rt/lt gluteus)  Last BM:  04/02/16  Height:   Ht Readings from Last 1 Encounters:  04/02/16 5\' 3"  (1.6 m)    Weight:   Wt Readings from Last 1 Encounters:  04/02/16 271 lb 2.7 oz (123 kg)    Ideal Body Weight:  52.2 kg  BMI:  Body mass index is 48.03 kg/m.  Estimated Nutritional Needs:   Kcal:  1600-1800  Protein:  90-105 grams  Fluid:  >1.6 L  EDUCATION NEEDS:   Education needs addressed  Amyr Sluder A. Mayford Knife, RD, LDN, CDE Pager: [phone] After hours Pager: [phone]

## 2016-04-04 ENCOUNTER — Ambulatory Visit: Payer: PPO | Admitting: Skilled Nursing Facility1

## 2016-04-04 ENCOUNTER — Inpatient Hospital Stay (HOSPITAL_COMMUNITY): Payer: PPO

## 2016-04-04 LAB — GLUCOSE, CAPILLARY
GLUCOSE-CAPILLARY: 335 mg/dL — AB (ref 65–99)
GLUCOSE-CAPILLARY: 369 mg/dL — AB (ref 65–99)
Glucose-Capillary: 346 mg/dL — ABNORMAL HIGH (ref 65–99)
Glucose-Capillary: 397 mg/dL — ABNORMAL HIGH (ref 65–99)

## 2016-04-04 MED ORDER — IBUPROFEN 400 MG PO TABS
400.0000 mg | ORAL_TABLET | Freq: Four times a day (QID) | ORAL | Status: DC | PRN
  Administered 2016-04-04: 400 mg via ORAL
  Filled 2016-04-04: qty 1

## 2016-04-04 MED ORDER — INSULIN GLARGINE 100 UNIT/ML ~~LOC~~ SOLN
30.0000 [IU] | Freq: Every day | SUBCUTANEOUS | Status: DC
  Administered 2016-04-04 – 2016-04-05 (×2): 30 [IU] via SUBCUTANEOUS
  Filled 2016-04-04 (×2): qty 0.3

## 2016-04-04 NOTE — Discharge Summary (Signed)
[facility] Discharge Summary  Patient name: Barbara [name] Medical record number: [id number] Date of birth: [birth year] Age: 60 y.o. Gender: female Date of Admission: 04/01/2016  Date of Discharge: 04/05/16  Admitting Physician: Carney Living, MD  Primary Care Provider: Sanjuana Letters, MD Consultants: wound care, PT/OT  Indication for Hospitalization: Lower extremity weakness  Discharge Diagnoses/Problem List:  Multiple Sclerosis LE Weakness Morbid obesity T2DM Right rotator cuff tear  Disposition: SNF  Discharge Condition: stable, improved  Discharge Exam:  Gen: pleasant, morbidly obese lady, laying in bed, in NAD CV: RRR no MRG Lungs: CTA bilaterally no increased work of breathing Abdomen: obese abdomen, soft, non-tender, non-distended Extremities: lower extremities with chronic venous stasis changes bilaterally, right heel with pressure ulcer Neuro: No focal deficits. 4/5 strength in lower extremities bilaterally, 5/5 strength in left arm 2/5 strength in right arm with painful ROM of right shoulder  Brief Hospital Course:  Barbara [name] a 60 y.o.femalewith a PMH of MS, HTN, spinal stenosis of lumbar and cervical regions, and chronic venous stasis who presented to [facility] ED withworsening LE weakness. She was also noted to have several sacral decubitus ulcers as well as wounds on her lower extremities. She was admitted to [facility] on 04/02/16 for management of these problems. She was treated with IV methylprednisolone 500mg  BID for 3 days for an acute MS flare. Her lower extremity weakness was improved with this treatment. Wound care was consulted to assess and treat her sacral and lower extremity ulcers. She was evaluated by PT and OT and recommended to be discharged to SNF. She was recently diagnosed with new onset Type 2 Diabetes thought to be worsened by chronic steroid use, being managed at home by oral metformin. Due to  high doses of IV steroids in the hospital she was managed on insulin during her hospitalization and the doses were titrated up to control high blood glucose levels. She was discharged to SNF on 04/05/16 in stable and improved condition.   Issues for Follow Up:  1. S/p large doses of IV steroids and new diagnosis of Type 2 DM with daily steroid use, will need readjustment of her insulin regimen and careful CBG monitoring over next 2-3 days 2. Recommend to taper off maintenance prednisone dose as an outpatient, primary neurologist does not believe this is of benefit to her from an MS standpoint 3. Continue PT/OT in SNF 4. Spoke with orthopedic surgery regarding patient's right rotator cuff tear, she is not a surgical candidate. She can follow up outpatient if she wants to. 5. Wound care recs are as follows: Stage 2 pressure injury to Right gluteal.sacrum area combination friction/shear and MASD: Apply enzymatic debridement ointment 1/4 inch thick, cover with bordered foam dressing. Change daily  Stage 2 pressure injury to right to Left gluteal combination friction/shear and MASD: cover with bordered foam dressing. Change daily  Left medial leg venous ulcer: 1/4 inch think layer of santyl , cover with ABD pad,dry compression wrap with 2 layer (kerlix and coban)   Right lateral leg venous ulcer: ,dry compression wrap with 2 layer (kerlix and coban)   Stage 2 pressure injury to right heel: bordered foam dressing  Scattered friction/Shear and MASD areas to bilateral posterior thighs: apply barrier ointment atleast every shift and after every episode of incontinence   Change compression wraps and topical therapy to BLE atleast twice weekly and prn with shower day.   Patient will be discharging to [facility]. Low air loss mattress recommended at [facility]  and when patient returns home  Prevalon Boots to Bilateral feet when in bed  Discussed possibility of suprapubic catheter need to maintain skin  integrity. She can follow up with neurologist   Significant Procedures: none  Significant Labs and Imaging:   Recent Labs Lab 04/01/16 2057 04/02/16 0400 04/03/16 0558  WBC 11.8* 12.6* 8.9  HGB 14.1 14.1 12.9  HCT 42.6 40.9 39.1  PLT 198 177 159    Recent Labs Lab 04/01/16 2057 04/02/16 0400 04/03/16 0558  NA 136  --  138  K 3.7  --  4.6  CL 100*  --  101  CO2 26  --  25  GLUCOSE 227*  --  348*  BUN 30*  --  26*  CREATININE 1.07* 0.96 1.00  CALCIUM 9.6  --  9.3  MG  --  1.7  --   PHOS  --  3.4  --   ALKPHOS 66  --   --   AST 36  --   --   ALT 37  --   --   ALBUMIN 3.3*  --   --     Mr Cervical Spine Wo Contrast  Result Date: 04/02/2016 CLINICAL DATA:  Multiple sclerosis with bilateral lower extremity weakness EXAM: MRI CERVICAL, THORACIC AND LUMBAR SPINE WITHOUT CONTRAST TECHNIQUE: Multiplanar and multiecho pulse sequences of the cervical spine, to include the craniocervical junction and cervicothoracic junction, and thoracic and lumbar spine, were obtained without intravenous contrast. COMPARISON:  Cervical and thoracic spine MRI 07/15/2015, lumbar spine MRI 05/05/2015 FINDINGS: Despite efforts by the technologist and patient, motion artifact is present on today's examination and could not be eliminated. This reduces the sensitivity and specificity of the study. Particularly, the axial images are severely degraded. MRI CERVICAL SPINE FINDINGS Alignment: There is straightening of the normal cervical lordosis, unchanged. Vertebrae: There is anterior cervical fusion at C5-T1. Cord: Lesion within the right posterior aspect of the cord at the C2 level is unchanged. The other previously seen cord lesions cannot be clearly identified due to motion artifact. A lesion within the right aspect of the cord at C7 is unchanged. There is no cord expansion. No large new lesions are seen on the sagittal inversion recovery sequence. Posterior Fossa, vertebral arteries, paraspinal tissues:  Visualized posterior fossa is normal. Vertebral artery flow voids are preserved. Normal visualized paraspinal soft tissues. Disc levels: Evaluation of the cervical disc spaces is severely degraded by motion artifact. There is no spinal canal stenosis. At the C5 level, there is a right subarticular disc/osteophyte the narrows the right ventral aspect of thecal sac. MRI THORACIC SPINE FINDINGS Alignment:  Physiologic Vertebrae: There is a hemangioma within the T3 vertebral body. No other focal marrow lesion. Cord: There is a T2 hyperintense lesion within the left aspect of the cord at the T5-T7 levels, unchanged. There are lesions within the right aspect of the cord at the T8 and T9 levels. A T2 hyperintense focus within the left aspect of the cord at the T11 level is favored to be artifactual. Paraspinal and other soft tissues: Negative. Disc levels: There is a disc bulge at the T7-T8 level effacing the ventral thecal sac and contacting the anterior surface of the cord. MRI LUMBAR SPINE FINDINGS Segmentation: Rudimentary disc space at what is considered on this examination to be S1-S2. This numbering difference from that on the MRI of the lumbar spine performed 05/05/2015. Alignment:  Physiologic. Vertebrae: There is nonspecific heterogeneous marrow signal lumbar spine. Conus medullaris: Extends to the  L2-3 level and appears normal. Paraspinal and other soft tissues: 1 cm left renal cyst. Otherwise unremarkable. Disc levels: At L3-L5, there is disc desiccation with small bulge. No spinal canal or neural foraminal stenosis. There is posterior decompression at this level. At L4-L5, there is a diffuse disc bulge with superimposed central protrusion and moderate bilateral facet hypertrophy. There is moderate to severe spinal canal stenosis. There is severe right and mild left neural foraminal stenosis. At L5-S1, there is a diffuse disc bulge and severe bilateral facet hypertrophy resulting in moderate to severe spinal  canal stenosis. There is severe left and moderate right neural foraminal stenosis. IMPRESSION: 1. The examination is degraded by motion, particularly the axial imaging of the cervical spine. 2. Multiple lesions within the cervical and thoracic spinal cord. The cervical lesions appear to be unchanged. Comparison of the thoracic spine is limited by the lack of axial imaging on the prior examination. 3. No expansile lesions. Otherwise, evaluation for active demyelination is limited in the absence of IV contrast material. 4. Moderate to severe spinal canal stenosis at L4-L5 and L5-S1 with severe right L4-5 and left L5-S1 foraminal stenosis. This is unchanged compared to the examination of 05/05/2015. 5. Please note differences in numbering of levels between this examination and the lumbar spine MRI 05/05/2015. For the purposes of this dictation, I consider the lowest disc space to be a rudimentary S1-S2 disc. This is with the benefit of counting 7 cervical and 12 thoracic vertebral bodies. Electronically Signed   By: Deatra RobinsonKevin  Herman M.D.   On: 04/02/2016 01:21   Mr Thoracic Spine Wo Contrast  Result Date: 04/02/2016 CLINICAL DATA:  Multiple sclerosis with bilateral lower extremity weakness EXAM: MRI CERVICAL, THORACIC AND LUMBAR SPINE WITHOUT CONTRAST TECHNIQUE: Multiplanar and multiecho pulse sequences of the cervical spine, to include the craniocervical junction and cervicothoracic junction, and thoracic and lumbar spine, were obtained without intravenous contrast. COMPARISON:  Cervical and thoracic spine MRI 07/15/2015, lumbar spine MRI 05/05/2015 FINDINGS: Despite efforts by the technologist and patient, motion artifact is present on today's examination and could not be eliminated. This reduces the sensitivity and specificity of the study. Particularly, the axial images are severely degraded. MRI CERVICAL SPINE FINDINGS Alignment: There is straightening of the normal cervical lordosis, unchanged. Vertebrae: There  is anterior cervical fusion at C5-T1. Cord: Lesion within the right posterior aspect of the cord at the C2 level is unchanged. The other previously seen cord lesions cannot be clearly identified due to motion artifact. A lesion within the right aspect of the cord at C7 is unchanged. There is no cord expansion. No large new lesions are seen on the sagittal inversion recovery sequence. Posterior Fossa, vertebral arteries, paraspinal tissues: Visualized posterior fossa is normal. Vertebral artery flow voids are preserved. Normal visualized paraspinal soft tissues. Disc levels: Evaluation of the cervical disc spaces is severely degraded by motion artifact. There is no spinal canal stenosis. At the C5 level, there is a right subarticular disc/osteophyte the narrows the right ventral aspect of thecal sac. MRI THORACIC SPINE FINDINGS Alignment:  Physiologic Vertebrae: There is a hemangioma within the T3 vertebral body. No other focal marrow lesion. Cord: There is a T2 hyperintense lesion within the left aspect of the cord at the T5-T7 levels, unchanged. There are lesions within the right aspect of the cord at the T8 and T9 levels. A T2 hyperintense focus within the left aspect of the cord at the T11 level is favored to be artifactual. Paraspinal and other soft  tissues: Negative. Disc levels: There is a disc bulge at the T7-T8 level effacing the ventral thecal sac and contacting the anterior surface of the cord. MRI LUMBAR SPINE FINDINGS Segmentation: Rudimentary disc space at what is considered on this examination to be S1-S2. This numbering difference from that on the MRI of the lumbar spine performed 05/05/2015. Alignment:  Physiologic. Vertebrae: There is nonspecific heterogeneous marrow signal lumbar spine. Conus medullaris: Extends to the L2-3 level and appears normal. Paraspinal and other soft tissues: 1 cm left renal cyst. Otherwise unremarkable. Disc levels: At L3-L5, there is disc desiccation with small bulge. No  spinal canal or neural foraminal stenosis. There is posterior decompression at this level. At L4-L5, there is a diffuse disc bulge with superimposed central protrusion and moderate bilateral facet hypertrophy. There is moderate to severe spinal canal stenosis. There is severe right and mild left neural foraminal stenosis. At L5-S1, there is a diffuse disc bulge and severe bilateral facet hypertrophy resulting in moderate to severe spinal canal stenosis. There is severe left and moderate right neural foraminal stenosis. IMPRESSION: 1. The examination is degraded by motion, particularly the axial imaging of the cervical spine. 2. Multiple lesions within the cervical and thoracic spinal cord. The cervical lesions appear to be unchanged. Comparison of the thoracic spine is limited by the lack of axial imaging on the prior examination. 3. No expansile lesions. Otherwise, evaluation for active demyelination is limited in the absence of IV contrast material. 4. Moderate to severe spinal canal stenosis at L4-L5 and L5-S1 with severe right L4-5 and left L5-S1 foraminal stenosis. This is unchanged compared to the examination of 05/05/2015. 5. Please note differences in numbering of levels between this examination and the lumbar spine MRI 05/05/2015. For the purposes of this dictation, I consider the lowest disc space to be a rudimentary S1-S2 disc. This is with the benefit of counting 7 cervical and 12 thoracic vertebral bodies. Electronically Signed   By: Deatra Robinson M.D.   On: 04/02/2016 01:21   Mr Lumbar Spine Wo Contrast  Result Date: 04/02/2016 CLINICAL DATA:  Multiple sclerosis with bilateral lower extremity weakness EXAM: MRI CERVICAL, THORACIC AND LUMBAR SPINE WITHOUT CONTRAST TECHNIQUE: Multiplanar and multiecho pulse sequences of the cervical spine, to include the craniocervical junction and cervicothoracic junction, and thoracic and lumbar spine, were obtained without intravenous contrast. COMPARISON:   Cervical and thoracic spine MRI 07/15/2015, lumbar spine MRI 05/05/2015 FINDINGS: Despite efforts by the technologist and patient, motion artifact is present on today's examination and could not be eliminated. This reduces the sensitivity and specificity of the study. Particularly, the axial images are severely degraded. MRI CERVICAL SPINE FINDINGS Alignment: There is straightening of the normal cervical lordosis, unchanged. Vertebrae: There is anterior cervical fusion at C5-T1. Cord: Lesion within the right posterior aspect of the cord at the C2 level is unchanged. The other previously seen cord lesions cannot be clearly identified due to motion artifact. A lesion within the right aspect of the cord at C7 is unchanged. There is no cord expansion. No large new lesions are seen on the sagittal inversion recovery sequence. Posterior Fossa, vertebral arteries, paraspinal tissues: Visualized posterior fossa is normal. Vertebral artery flow voids are preserved. Normal visualized paraspinal soft tissues. Disc levels: Evaluation of the cervical disc spaces is severely degraded by motion artifact. There is no spinal canal stenosis. At the C5 level, there is a right subarticular disc/osteophyte the narrows the right ventral aspect of thecal sac. MRI THORACIC SPINE FINDINGS Alignment:  Physiologic Vertebrae: There is a hemangioma within the T3 vertebral body. No other focal marrow lesion. Cord: There is a T2 hyperintense lesion within the left aspect of the cord at the T5-T7 levels, unchanged. There are lesions within the right aspect of the cord at the T8 and T9 levels. A T2 hyperintense focus within the left aspect of the cord at the T11 level is favored to be artifactual. Paraspinal and other soft tissues: Negative. Disc levels: There is a disc bulge at the T7-T8 level effacing the ventral thecal sac and contacting the anterior surface of the cord. MRI LUMBAR SPINE FINDINGS Segmentation: Rudimentary disc space at what is  considered on this examination to be S1-S2. This numbering difference from that on the MRI of the lumbar spine performed 05/05/2015. Alignment:  Physiologic. Vertebrae: There is nonspecific heterogeneous marrow signal lumbar spine. Conus medullaris: Extends to the L2-3 level and appears normal. Paraspinal and other soft tissues: 1 cm left renal cyst. Otherwise unremarkable. Disc levels: At L3-L5, there is disc desiccation with small bulge. No spinal canal or neural foraminal stenosis. There is posterior decompression at this level. At L4-L5, there is a diffuse disc bulge with superimposed central protrusion and moderate bilateral facet hypertrophy. There is moderate to severe spinal canal stenosis. There is severe right and mild left neural foraminal stenosis. At L5-S1, there is a diffuse disc bulge and severe bilateral facet hypertrophy resulting in moderate to severe spinal canal stenosis. There is severe left and moderate right neural foraminal stenosis. IMPRESSION: 1. The examination is degraded by motion, particularly the axial imaging of the cervical spine. 2. Multiple lesions within the cervical and thoracic spinal cord. The cervical lesions appear to be unchanged. Comparison of the thoracic spine is limited by the lack of axial imaging on the prior examination. 3. No expansile lesions. Otherwise, evaluation for active demyelination is limited in the absence of IV contrast material. 4. Moderate to severe spinal canal stenosis at L4-L5 and L5-S1 with severe right L4-5 and left L5-S1 foraminal stenosis. This is unchanged compared to the examination of 05/05/2015. 5. Please note differences in numbering of levels between this examination and the lumbar spine MRI 05/05/2015. For the purposes of this dictation, I consider the lowest disc space to be a rudimentary S1-S2 disc. This is with the benefit of counting 7 cervical and 12 thoracic vertebral bodies. Electronically Signed   By: Deatra Robinson M.D.   On:  04/02/2016 01:21   Mr Shoulder Right Wo Contrast  Result Date: 04/05/2016 CLINICAL DATA:  Left shoulder pain and weakness. Recent shoulder injury. EXAM: MRI OF THE RIGHT SHOULDER WITHOUT CONTRAST TECHNIQUE: Multiplanar, multisequence MR imaging of the shoulder was performed. No intravenous contrast was administered. COMPARISON:  None. FINDINGS: Examination is quite limited due to morbid obesity and patient motion. Very poor signal to noise ratio. Rotator cuff: Large full-thickness retracted rotator cuff tear. This involves the supraspinous, infraspinatus and subscapularis tendons. All 3 tendons are retracted approximately 25 mm. Muscles: Marked edema, fluid and possible hemorrhage involving the infraspinatus muscle. Early fatty atrophy of all the shoulder muscles. Biceps long head: Intact. Moderate tendinopathy involving the intra-articular portion. Acromioclavicular Joint: Advanced degenerative changes. Type II acromion. Mild lateral downsloping and undersurface spurring. Glenohumeral Joint: Moderate glenohumeral joint degenerative changes. Small joint effusion. There is thickening of the capsular structures in the axillary recess which can be seen with adhesive capsulitis or synovitis. Labrum:  Grossly intact. Bones:  No acute bony findings. Other: None IMPRESSION: 1. Large full-thickness retracted  rotator cuff tear. 2. Significant edema, fluid and inflammation involving the infraspinatus muscle. 3. Intact long head biceps tendon.  Moderate tendinopathy. 4. MR findings suggest bony impingement. 5. There is thickening of the capsular structures in the axillary recess which can be seen with adhesive capsulitis or synovitis. Electronically Signed   By: Rudie Meyer M.D.   On: 04/05/2016 08:53     Results/Tests Pending at Time of Discharge: none  Discharge Medications:    Medication List    TAKE these medications   acetaminophen 325 MG tablet Commonly known as:  TYLENOL Take 2 tablets (650 mg total)  by mouth every 6 (six) hours as needed (mild pain, fever >100.4).   aspirin EC 325 MG tablet Take 325 mg by mouth daily.   calcium carbonate 500 MG chewable tablet Commonly known as:  TUMS - dosed in mg elemental calcium Chew 1 tablet by mouth as needed for indigestion or heartburn.   calcium-vitamin D 500-200 MG-UNIT tablet Commonly known as:  OSCAL WITH D Take 1 tablet by mouth daily.   CO Q 10 PO Take 1 tablet by mouth daily.   colchicine 0.6 MG tablet Take 0.6 mg by mouth daily as needed (gout).   collagenase ointment Commonly known as:  SANTYL Apply topically daily.   diazepam 5 MG tablet Commonly known as:  VALIUM TAKE 1 TABLET BY MOUTH EVERY 6 HOURS AS NEEDED FOR LEG SPASMS   dicyclomine 20 MG tablet Commonly known as:  BENTYL Take 1 tablet (20 mg total) by mouth 3 (three) times daily as needed for spasms (abdominal pain).   furosemide 20 MG tablet Commonly known as:  LASIX TAKE 1 TABLET BY MOUTH ONCE DAILY What changed:  See the new instructions.   gabapentin 300 MG capsule Commonly known as:  NEURONTIN Take 1 capsule (300 mg total) by mouth 3 (three) times daily.   ibuprofen 400 MG tablet Commonly known as:  ADVIL,MOTRIN Take 1 tablet (400 mg total) by mouth every 6 (six) hours as needed for headache or moderate pain.   insulin aspart 100 UNIT/ML injection Commonly known as:  novoLOG Inject 10 Units into the skin 3 (three) times daily with meals as needed for high blood sugar (if CBG > 200).   insulin glargine 100 UNIT/ML injection Commonly known as:  LANTUS Inject 0.2 mLs (20 Units total) into the skin daily.   lisinopril 10 MG tablet Commonly known as:  PRINIVIL,ZESTRIL TAKE 1 TABLET BY MOUTH ONCE DAILY   Magnesium 250 MG Tabs Take 250 mg by mouth daily.   metFORMIN 1000 MG tablet Commonly known as:  GLUCOPHAGE One half tab two times a day for one week then one full tab twice a day thereafter. What changed:  how much to take  how to take  this  when to take this  additional instructions   methocarbamol 750 MG tablet Commonly known as:  ROBAXIN TAKE 2 TABLETS (1,500 MG TOTAL) BY MOUTH 3 (THREE) TIMES DAILY. What changed:  how much to take  when to take this   metoCLOPramide 10 MG tablet Commonly known as:  REGLAN Take 1 tablet (10 mg total) by mouth every 6 (six) hours as needed for nausea or vomiting.   MULTIVITAMIN PO Take 1 tablet by mouth daily.   OVER THE COUNTER MEDICATION Take 2 drops by mouth daily. Hemp Extract   oxybutynin 5 MG 24 hr tablet Commonly known as:  DITROPAN-XL Take 1 tablet (5 mg total) by mouth at bedtime.  predniSONE 10 MG tablet Commonly known as:  DELTASONE Take 3 tablets (30 mg total) by mouth daily with breakfast. What changed:  how much to take   traMADol 50 MG tablet Commonly known as:  ULTRAM TAKE 1 TABLET BY MOUTH 4 TIMES DAILY   trolamine salicylate 10 % cream Commonly known as:  ASPERCREME Apply 1 application topically as needed for muscle pain.   VITAMIN B-12 PO Take 1 tablet by mouth daily.   Vitamin D3 1000 units Caps Take 4,000 Units by mouth daily.       Discharge Instructions: Please refer to Patient Instructions section of EMR for full details.  Patient was counseled important signs and symptoms that should prompt return to medical care, changes in medications, dietary instructions, activity restrictions, and follow up appointments.   Follow-Up Appointments:   Tillman Sers, DO 04/05/2016, 1:47 PM PGY-1, [facility]

## 2016-04-04 NOTE — Progress Notes (Signed)
Physical Therapy Treatment Patient Details Name: Barbara [name] MRN: [id number] DOB: Jun 02, 1955 Today's Date: 04/04/2016    History of Present Illness Pt is a 60 y.o. female presenting with LE weakness. PMH significiant for MS. Patient states that starting around November 2 she started having worseing of her LE weakness. At baseline, patient lives independently and able to transfer herself. She is wheelchair bound at baseline. Starting around Nov 2 patient endorses some n/v/d. She presented to ED on 11/10 with these symtpoms and was given IVF and antiemetics with improvement. Patient states she had a total of about 2 weeks of symptoms. Usually when she gets any type of illness her MS flares up. Since the stomach virus patient has had the LE weakness.     PT Comments    Pt was able to transfer from bed to chair with Huntley Dec lift today. Pt performed sit to stand 4x and was able to maintain standing position for 30 seconds, then 5 seconds, then 15 seconds, then 10 seconds before requiring a seated rest break. Pt demonstrates fair UE strength to assist with pulling up to stand on Sara lift and is most limited by B LE weakness and decreased activity tolerance. Pt's R LE spasmed today during 1st attempt at sit to stand with Huntley Dec lift. Pt demonstrated greater ability to actively move through knee extension ROM today, indicating increased strength and motor control in B LEs. Pt continues to be a good candidate for SNF to maximize strength and mobility gains before returning to home. NT was shown how to use [employer]ley Dec Lift to assist pt back to bed during session.    Follow Up Recommendations  SNF     Equipment Recommendations  None recommended by PT    Recommendations for Other Services       Precautions / Restrictions Precautions Precautions: None Restrictions Weight Bearing Restrictions: No    Mobility  Bed Mobility Overal bed mobility: Needs Assistance Bed Mobility: Rolling;Sidelying to Sit;Sit to  Sidelying Rolling: Min assist Sidelying to sit: Mod assist     Sit to sidelying: Mod assist General bed mobility comments: Mod assist with transfer from sidelying to sit for trunk elevation; pt requires mod assist to lift B LEs into and out of bed  Transfers Overall transfer level: Needs assistance   Transfers: Sit to/from Stand           General transfer comment: Pt able to stand with [employer]. Pt able to remain in standing position for 30 seconds, then 5 seconds, then 15 seconds, then 10 seconds, before requiring a seated rest break  Ambulation/Gait                 Stairs            Wheelchair Mobility    Modified Rankin (Stroke Patients Only)       Balance Overall balance assessment: Needs assistance Sitting-balance support: No upper extremity supported;Feet supported Sitting balance-Leahy Scale: Good Sitting balance - Comments: Pt able to sit EOB to without UE support   Standing balance support: Bilateral upper extremity supported Standing balance-Leahy Scale: Poor Standing balance comment: Pt only able to stand with assist from [employer] Arousal/Alertness: Awake/alert Behavior During Therapy: [facility] for tasks assessed/performed Overall Cognitive Status: Within Functional Limits for tasks assessed  Exercises      General Comments        Pertinent Vitals/Pain Pain Assessment: 0-10 Pain Score: 5  Pain Location: R arm Pain Descriptors / Indicators: Discomfort Pain Intervention(s): Limited activity within patient's tolerance;Monitored during session;Repositioned    Home Living                      Prior Function            PT Goals (current goals can now be found in the care plan section) Acute Rehab PT Goals Patient Stated Goal: to return back to baseline PT Goal Formulation: With patient Time For Goal Achievement: 04/16/16 Potential to Achieve Goals:  Fair Progress towards PT goals: Progressing toward goals    Frequency    Min 3X/week      PT Plan Current plan remains appropriate    Co-evaluation             End of Session   Activity Tolerance: Patient limited by fatigue Patient left: in chair;with call bell/phone within reach     Time: [id number] PT Time Calculation (min) (ACUTE ONLY): 35 min  Charges:  $Therapeutic Activity: 23-37 mins                    G Codes:      Gaye PollackRebecca Esa Raden 04/04/2016, 2:39 PM  Gaye Pollackebecca Tonette Koehne, SPT [phone]

## 2016-04-04 NOTE — Consult Note (Addendum)
WOC followed up with patient today.  Bilateral LE are still wrapped and patient reports she has been able to tolerate them without any issues.  WOC would like to have them stay in place until tomorrow at which time I will reassess the ulcers and determine the topical care and frequency for change for her DC POC.  I will reassess her pressure injuries and condition of her buttocks and thighs as well tomorrow.  Patient has a FC now to protect her skin from incontinence.  LALM in place for pressure redistribution, microclimate and moisture management.  WOC will follow up in am  Yonas Bunda [facility] MSN, RN, [place], [place] [phone]

## 2016-04-04 NOTE — Progress Notes (Signed)
Transitions of Care Pharmacy Note  Plan:  Educated on tapering her steroids and her need for insulin during admission. Educated her on how insulin was given and explained that it can be taken concomitantly with metformin. We also talked about how steroids can affect blood sugar and why it would be important to wean off of the steroids.    Addressed concerns regarding the cost of her MS medication. Encouraged patient to work with assistance programs.   --------------------------------------------- Barbara [name] is an 60 y.o. female who presents with a chief complaint of LE weakness associated with a MS flare. In anticipation of discharge, pharmacy has reviewed this patient's prior to admission medication history, as well as current inpatient medications listed per the [facility].  Current medication indications, dosing, frequency, and notable side effects reviewed with patient. patient verbalized understanding of current inpatient medication regimen and is aware that the After Visit Summary when presented, will represent the most accurate medication list at discharge.   Azadeh Kalicki expressed concerns regarding the cost of her MS medication, Copaxin. She told me that she had been through several grant programs but was not able to afford a year's supply of the medication. Her neurologist is  aware and she has attempted to get assistance through [facility]. [facility] has been consulted and is working on the case.   Assessment: Understanding of regimen: good Understanding of indications: good Potential of compliance: good Barriers to Obtaining Medications: Yes- MS medication only. She states that she gets other medications at a good price from the [facility].   Patient instructed to contact inpatient pharmacy team with further questions or concerns if needed.    Time spent preparing for discharge counseling: 15 minutes  Time spent counseling patient: 15 minutes   Thank you  for allowing pharmacy to be a part of this patient's care.  Hillis Range, PharmD PGY1 Pharmacy Resident Pager: [phone]

## 2016-04-04 NOTE — Clinical Social Work Note (Addendum)
Referral for [facility] still pending. CSW contacted admissions coordinator who stated that they were still reviewing her information and are concerned about a safe discharge after SNF due to wounds. Admissions coordinator is going to reach out to patient's sisters to discuss what that plan will be before making a decision.  Charlynn Court, CSW [phone]  12:55 pm [facility] has extended a bed offer after speaking with patient's sisters. Per MD note today, patient will likely discharge tomorrow. CSW called [employer] to start insurance authorization.  Charlynn Court, CSW [phone]  3:12 pm Insurance authorization obtained: [id number]. SNF notified.  Charlynn Court, CSW [phone]

## 2016-04-04 NOTE — Progress Notes (Signed)
Family Medicine Teaching Service Daily Progress Note Intern Pager: [phone]  Patient name: Barbara [name] Medical record number: [id number] Date of birth: 1956-01-29 Age: 60 y.o. Gender: female  Primary Care Provider: Sanjuana Letters, MD Consultants: wound care Code Status: DNR  Pt Overview and Major Events to Date:  11/28- admitted to FPTS  Assessment and Plan: Barbara [name] is a 60 y.o. female presenting with worsening LE weakness. PMH is significant for MS, HTN, spinal stenosis of lumbar region and cervical region, chronic venous stasis.   LE weakness- likely secondary to MS flare after recent gastroenteritis. She reports this episode is exactly like her previous flares that have improved with IV steroids in past. MRI unchanged from prior studies.  -Continue IV 500mg  solumedrol for total of 3 days, last dose tomorrow morning -Cotinue home Robaxin 1500mg  TID, gabapentin 300mg  TID, ASA 325 daily -PT/OT recommending SNF placement, following -care management consulted as patient has difficulty affording her medications -recommendation to discontinue maintenance steroids outpatient as these are likely causing more long time problems  Decubitus ulcers- new problem for patient, due to laying in bed for several weeks -wound care consulted, appreciate recommendations  -Foley in place due to urinary incontinence worsening sacral ulcers, plan to discontinue this prior to discharge  Right shoulder pain- likely muscle strain -ibuprofen 400mg  q6h as needed for pain -continue PT/OT  HTN- stable- Takes lisinopril 10mg  daily at home. Normotensive. Blood pressure 118/84 this morning -continue home lisinopril -monitor blood pressure  Leukocytosis- stable- likely due to chronic steroid use. -continue to monitor  T2DM- uncontrolled, due to steroid use- takes metformin 1000 mg BID at home, held at time of admission. New onset in setting of daily prednisone use. Last A1C 10.3 (Nov  2017).  -monitor CBG AC/HS -will increase Lantus to 30 units daily, patient requiring more insulin coverage in setting of IV steroids -rSSI -this morning glucose is 327, has been consistently in 300's since admission  Incontinence- chronic problem for patient. Foley in place. -continue oxybuynin  Chronic venous stasis- -wound care following, appreciate recommendations -takes PO lasix daily at home, was held at time of admission, will add back today  Chronic pain- controlled on home regimen -k-pad prn -continue home tramadol -valium 5mg  every 6 hours as needed for muscle spams -gabapentin 300mg  TID -ibuprofen 400 mg q6 prn  FEN/GI: heart healthy, carb modified diet, PPI PPx: lovenox  Disposition: SNF, pending completion of IV steroids, anticipate discharge 04/05/16  Subjective:  Barbara [name] is complaining of right shoulder pain that has worsened with trying to do OT exercises- she injured this shoulder in early November when she fell out of her car and grabbed a handle to catch herself. It is difficult to bear weight on it.   Feels like her leg weakness has improved. Is looking at different nursing homes to choose the best option for her and rehabilitation.   Objective: Temp:  [97.6 F (36.4 C)-98.4 F (36.9 C)] 98.4 F (36.9 C) (11/30 0550) Pulse Rate:  [70-96] 73 (11/30 0550) Resp:  [16-18] 16 (11/30 0550) BP: (103-131)/(54-84) 118/84 (11/30 0550) SpO2:  [93 %-96 %] 96 % (11/30 0550) Physical Exam: General: morbidly obese lady, laying comfortably in bed, in NAD Cardiovascular: RRR, no MRG Respiratory: CTA in anterior lung fields no increased work of breathing Abdomen: obese abdomen, soft, non-tender, non-distended, +BS Extremities: legs wrapped bilaterally up to knees. Right shoulder tender to palpation over right acromion process. Pain with ROM. No swelling. Normal strength in right upper extremity.  Laboratory:  Recent Labs Lab 04/01/16 2057 04/02/16 0400  04/03/16 0558  WBC 11.8* 12.6* 8.9  HGB 14.1 14.1 12.9  HCT 42.6 40.9 39.1  PLT 198 177 159    Recent Labs Lab 04/01/16 2057 04/02/16 0400 04/03/16 0558  NA 136  --  138  K 3.7  --  4.6  CL 100*  --  101  CO2 26  --  25  BUN 30*  --  26*  CREATININE 1.07* 0.96 1.00  CALCIUM 9.6  --  9.3  PROT 6.8  --   --   BILITOT 0.7  --   --   ALKPHOS 66  --   --   ALT 37  --   --   AST 36  --   --   GLUCOSE 227*  --  348*     Imaging/Diagnostic Tests: Mr Cervical Spine Wo Contrast  Result Date: 04/02/2016 CLINICAL DATA:  Multiple sclerosis with bilateral lower extremity weakness EXAM: MRI CERVICAL, THORACIC AND LUMBAR SPINE WITHOUT CONTRAST TECHNIQUE: Multiplanar and multiecho pulse sequences of the cervical spine, to include the craniocervical junction and cervicothoracic junction, and thoracic and lumbar spine, were obtained without intravenous contrast. COMPARISON:  Cervical and thoracic spine MRI 07/15/2015, lumbar spine MRI 05/05/2015 FINDINGS: Despite efforts by the technologist and patient, motion artifact is present on today's examination and could not be eliminated. This reduces the sensitivity and specificity of the study. Particularly, the axial images are severely degraded. MRI CERVICAL SPINE FINDINGS Alignment: There is straightening of the normal cervical lordosis, unchanged. Vertebrae: There is anterior cervical fusion at C5-T1. Cord: Lesion within the right posterior aspect of the cord at the C2 level is unchanged. The other previously seen cord lesions cannot be clearly identified due to motion artifact. A lesion within the right aspect of the cord at C7 is unchanged. There is no cord expansion. No large new lesions are seen on the sagittal inversion recovery sequence. Posterior Fossa, vertebral arteries, paraspinal tissues: Visualized posterior fossa is normal. Vertebral artery flow voids are preserved. Normal visualized paraspinal soft tissues. Disc levels: Evaluation of the  cervical disc spaces is severely degraded by motion artifact. There is no spinal canal stenosis. At the C5 level, there is a right subarticular disc/osteophyte the narrows the right ventral aspect of thecal sac. MRI THORACIC SPINE FINDINGS Alignment:  Physiologic Vertebrae: There is a hemangioma within the T3 vertebral body. No other focal marrow lesion. Cord: There is a T2 hyperintense lesion within the left aspect of the cord at the T5-T7 levels, unchanged. There are lesions within the right aspect of the cord at the T8 and T9 levels. A T2 hyperintense focus within the left aspect of the cord at the T11 level is favored to be artifactual. Paraspinal and other soft tissues: Negative. Disc levels: There is a disc bulge at the T7-T8 level effacing the ventral thecal sac and contacting the anterior surface of the cord. MRI LUMBAR SPINE FINDINGS Segmentation: Rudimentary disc space at what is considered on this examination to be S1-S2. This numbering difference from that on the MRI of the lumbar spine performed 05/05/2015. Alignment:  Physiologic. Vertebrae: There is nonspecific heterogeneous marrow signal lumbar spine. Conus medullaris: Extends to the L2-3 level and appears normal. Paraspinal and other soft tissues: 1 cm left renal cyst. Otherwise unremarkable. Disc levels: At L3-L5, there is disc desiccation with small bulge. No spinal canal or neural foraminal stenosis. There is posterior decompression at this level. At L4-L5, there is a diffuse disc  bulge with superimposed central protrusion and moderate bilateral facet hypertrophy. There is moderate to severe spinal canal stenosis. There is severe right and mild left neural foraminal stenosis. At L5-S1, there is a diffuse disc bulge and severe bilateral facet hypertrophy resulting in moderate to severe spinal canal stenosis. There is severe left and moderate right neural foraminal stenosis. IMPRESSION: 1. The examination is degraded by motion, particularly the  axial imaging of the cervical spine. 2. Multiple lesions within the cervical and thoracic spinal cord. The cervical lesions appear to be unchanged. Comparison of the thoracic spine is limited by the lack of axial imaging on the prior examination. 3. No expansile lesions. Otherwise, evaluation for active demyelination is limited in the absence of IV contrast material. 4. Moderate to severe spinal canal stenosis at L4-L5 and L5-S1 with severe right L4-5 and left L5-S1 foraminal stenosis. This is unchanged compared to the examination of 05/05/2015. 5. Please note differences in numbering of levels between this examination and the lumbar spine MRI 05/05/2015. For the purposes of this dictation, I consider the lowest disc space to be a rudimentary S1-S2 disc. This is with the benefit of counting 7 cervical and 12 thoracic vertebral bodies. Electronically Signed   By: Deatra Robinson M.D.   On: 04/02/2016 01:21   Mr Thoracic Spine Wo Contrast  Result Date: 04/02/2016 CLINICAL DATA:  Multiple sclerosis with bilateral lower extremity weakness EXAM: MRI CERVICAL, THORACIC AND LUMBAR SPINE WITHOUT CONTRAST TECHNIQUE: Multiplanar and multiecho pulse sequences of the cervical spine, to include the craniocervical junction and cervicothoracic junction, and thoracic and lumbar spine, were obtained without intravenous contrast. COMPARISON:  Cervical and thoracic spine MRI 07/15/2015, lumbar spine MRI 05/05/2015 FINDINGS: Despite efforts by the technologist and patient, motion artifact is present on today's examination and could not be eliminated. This reduces the sensitivity and specificity of the study. Particularly, the axial images are severely degraded. MRI CERVICAL SPINE FINDINGS Alignment: There is straightening of the normal cervical lordosis, unchanged. Vertebrae: There is anterior cervical fusion at C5-T1. Cord: Lesion within the right posterior aspect of the cord at the C2 level is unchanged. The other previously seen  cord lesions cannot be clearly identified due to motion artifact. A lesion within the right aspect of the cord at C7 is unchanged. There is no cord expansion. No large new lesions are seen on the sagittal inversion recovery sequence. Posterior Fossa, vertebral arteries, paraspinal tissues: Visualized posterior fossa is normal. Vertebral artery flow voids are preserved. Normal visualized paraspinal soft tissues. Disc levels: Evaluation of the cervical disc spaces is severely degraded by motion artifact. There is no spinal canal stenosis. At the C5 level, there is a right subarticular disc/osteophyte the narrows the right ventral aspect of thecal sac. MRI THORACIC SPINE FINDINGS Alignment:  Physiologic Vertebrae: There is a hemangioma within the T3 vertebral body. No other focal marrow lesion. Cord: There is a T2 hyperintense lesion within the left aspect of the cord at the T5-T7 levels, unchanged. There are lesions within the right aspect of the cord at the T8 and T9 levels. A T2 hyperintense focus within the left aspect of the cord at the T11 level is favored to be artifactual. Paraspinal and other soft tissues: Negative. Disc levels: There is a disc bulge at the T7-T8 level effacing the ventral thecal sac and contacting the anterior surface of the cord. MRI LUMBAR SPINE FINDINGS Segmentation: Rudimentary disc space at what is considered on this examination to be S1-S2. This numbering difference from that  on the MRI of the lumbar spine performed 05/05/2015. Alignment:  Physiologic. Vertebrae: There is nonspecific heterogeneous marrow signal lumbar spine. Conus medullaris: Extends to the L2-3 level and appears normal. Paraspinal and other soft tissues: 1 cm left renal cyst. Otherwise unremarkable. Disc levels: At L3-L5, there is disc desiccation with small bulge. No spinal canal or neural foraminal stenosis. There is posterior decompression at this level. At L4-L5, there is a diffuse disc bulge with superimposed  central protrusion and moderate bilateral facet hypertrophy. There is moderate to severe spinal canal stenosis. There is severe right and mild left neural foraminal stenosis. At L5-S1, there is a diffuse disc bulge and severe bilateral facet hypertrophy resulting in moderate to severe spinal canal stenosis. There is severe left and moderate right neural foraminal stenosis. IMPRESSION: 1. The examination is degraded by motion, particularly the axial imaging of the cervical spine. 2. Multiple lesions within the cervical and thoracic spinal cord. The cervical lesions appear to be unchanged. Comparison of the thoracic spine is limited by the lack of axial imaging on the prior examination. 3. No expansile lesions. Otherwise, evaluation for active demyelination is limited in the absence of IV contrast material. 4. Moderate to severe spinal canal stenosis at L4-L5 and L5-S1 with severe right L4-5 and left L5-S1 foraminal stenosis. This is unchanged compared to the examination of 05/05/2015. 5. Please note differences in numbering of levels between this examination and the lumbar spine MRI 05/05/2015. For the purposes of this dictation, I consider the lowest disc space to be a rudimentary S1-S2 disc. This is with the benefit of counting 7 cervical and 12 thoracic vertebral bodies. Electronically Signed   By: Deatra Robinson M.D.   On: 04/02/2016 01:21   Mr Lumbar Spine Wo Contrast  Result Date: 04/02/2016 CLINICAL DATA:  Multiple sclerosis with bilateral lower extremity weakness EXAM: MRI CERVICAL, THORACIC AND LUMBAR SPINE WITHOUT CONTRAST TECHNIQUE: Multiplanar and multiecho pulse sequences of the cervical spine, to include the craniocervical junction and cervicothoracic junction, and thoracic and lumbar spine, were obtained without intravenous contrast. COMPARISON:  Cervical and thoracic spine MRI 07/15/2015, lumbar spine MRI 05/05/2015 FINDINGS: Despite efforts by the technologist and patient, motion artifact is  present on today's examination and could not be eliminated. This reduces the sensitivity and specificity of the study. Particularly, the axial images are severely degraded. MRI CERVICAL SPINE FINDINGS Alignment: There is straightening of the normal cervical lordosis, unchanged. Vertebrae: There is anterior cervical fusion at C5-T1. Cord: Lesion within the right posterior aspect of the cord at the C2 level is unchanged. The other previously seen cord lesions cannot be clearly identified due to motion artifact. A lesion within the right aspect of the cord at C7 is unchanged. There is no cord expansion. No large new lesions are seen on the sagittal inversion recovery sequence. Posterior Fossa, vertebral arteries, paraspinal tissues: Visualized posterior fossa is normal. Vertebral artery flow voids are preserved. Normal visualized paraspinal soft tissues. Disc levels: Evaluation of the cervical disc spaces is severely degraded by motion artifact. There is no spinal canal stenosis. At the C5 level, there is a right subarticular disc/osteophyte the narrows the right ventral aspect of thecal sac. MRI THORACIC SPINE FINDINGS Alignment:  Physiologic Vertebrae: There is a hemangioma within the T3 vertebral body. No other focal marrow lesion. Cord: There is a T2 hyperintense lesion within the left aspect of the cord at the T5-T7 levels, unchanged. There are lesions within the right aspect of the cord at the T8 and  T9 levels. A T2 hyperintense focus within the left aspect of the cord at the T11 level is favored to be artifactual. Paraspinal and other soft tissues: Negative. Disc levels: There is a disc bulge at the T7-T8 level effacing the ventral thecal sac and contacting the anterior surface of the cord. MRI LUMBAR SPINE FINDINGS Segmentation: Rudimentary disc space at what is considered on this examination to be S1-S2. This numbering difference from that on the MRI of the lumbar spine performed 05/05/2015. Alignment:   Physiologic. Vertebrae: There is nonspecific heterogeneous marrow signal lumbar spine. Conus medullaris: Extends to the L2-3 level and appears normal. Paraspinal and other soft tissues: 1 cm left renal cyst. Otherwise unremarkable. Disc levels: At L3-L5, there is disc desiccation with small bulge. No spinal canal or neural foraminal stenosis. There is posterior decompression at this level. At L4-L5, there is a diffuse disc bulge with superimposed central protrusion and moderate bilateral facet hypertrophy. There is moderate to severe spinal canal stenosis. There is severe right and mild left neural foraminal stenosis. At L5-S1, there is a diffuse disc bulge and severe bilateral facet hypertrophy resulting in moderate to severe spinal canal stenosis. There is severe left and moderate right neural foraminal stenosis. IMPRESSION: 1. The examination is degraded by motion, particularly the axial imaging of the cervical spine. 2. Multiple lesions within the cervical and thoracic spinal cord. The cervical lesions appear to be unchanged. Comparison of the thoracic spine is limited by the lack of axial imaging on the prior examination. 3. No expansile lesions. Otherwise, evaluation for active demyelination is limited in the absence of IV contrast material. 4. Moderate to severe spinal canal stenosis at L4-L5 and L5-S1 with severe right L4-5 and left L5-S1 foraminal stenosis. This is unchanged compared to the examination of 05/05/2015. 5. Please note differences in numbering of levels between this examination and the lumbar spine MRI 05/05/2015. For the purposes of this dictation, I consider the lowest disc space to be a rudimentary S1-S2 disc. This is with the benefit of counting 7 cervical and 12 thoracic vertebral bodies. Electronically Signed   By: Deatra RobinsonKevin  Herman M.D.   On: 04/02/2016 01:21     Tillman Sersngela C Damont Balles, DO 04/04/2016, 7:34 AM PGY-1, [facility] FPTS Intern pager: [phone], text pages  welcome

## 2016-04-05 DIAGNOSIS — Z7409 Other reduced mobility: Secondary | ICD-10-CM | POA: Diagnosis not present

## 2016-04-05 DIAGNOSIS — E119 Type 2 diabetes mellitus without complications: Secondary | ICD-10-CM | POA: Diagnosis not present

## 2016-04-05 DIAGNOSIS — G35 Multiple sclerosis: Principal | ICD-10-CM

## 2016-04-05 DIAGNOSIS — F801 Expressive language disorder: Secondary | ICD-10-CM | POA: Diagnosis not present

## 2016-04-05 DIAGNOSIS — S46009A Unspecified injury of muscle(s) and tendon(s) of the rotator cuff of unspecified shoulder, initial encounter: Secondary | ICD-10-CM | POA: Diagnosis not present

## 2016-04-05 DIAGNOSIS — M75101 Unspecified rotator cuff tear or rupture of right shoulder, not specified as traumatic: Secondary | ICD-10-CM | POA: Diagnosis not present

## 2016-04-05 DIAGNOSIS — R279 Unspecified lack of coordination: Secondary | ICD-10-CM | POA: Diagnosis not present

## 2016-04-05 DIAGNOSIS — I10 Essential (primary) hypertension: Secondary | ICD-10-CM

## 2016-04-05 DIAGNOSIS — L89322 Pressure ulcer of left buttock, stage 2: Secondary | ICD-10-CM | POA: Diagnosis not present

## 2016-04-05 DIAGNOSIS — L89612 Pressure ulcer of right heel, stage 2: Secondary | ICD-10-CM | POA: Diagnosis not present

## 2016-04-05 DIAGNOSIS — M6281 Muscle weakness (generalized): Secondary | ICD-10-CM | POA: Diagnosis not present

## 2016-04-05 DIAGNOSIS — L97829 Non-pressure chronic ulcer of other part of left lower leg with unspecified severity: Secondary | ICD-10-CM | POA: Diagnosis not present

## 2016-04-05 DIAGNOSIS — L89312 Pressure ulcer of right buttock, stage 2: Secondary | ICD-10-CM | POA: Diagnosis not present

## 2016-04-05 DIAGNOSIS — R1313 Dysphagia, pharyngeal phase: Secondary | ICD-10-CM | POA: Diagnosis not present

## 2016-04-05 DIAGNOSIS — L89159 Pressure ulcer of sacral region, unspecified stage: Secondary | ICD-10-CM | POA: Diagnosis not present

## 2016-04-05 LAB — GLUCOSE, CAPILLARY
GLUCOSE-CAPILLARY: 352 mg/dL — AB (ref 65–99)
GLUCOSE-CAPILLARY: 335 mg/dL — AB (ref 65–99)
Glucose-Capillary: 285 mg/dL — ABNORMAL HIGH (ref 65–99)

## 2016-04-05 MED ORDER — INSULIN ASPART 100 UNIT/ML ~~LOC~~ SOLN: 10.0000 [IU] | Freq: Three times a day (TID) | SUBCUTANEOUS | 11 refills | Status: DC | PRN

## 2016-04-05 MED ORDER — INSULIN GLARGINE 100 UNIT/ML ~~LOC~~ SOLN: 20.0000 [IU] | Freq: Every day | SUBCUTANEOUS | 11 refills | Status: DC

## 2016-04-05 MED ORDER — IBUPROFEN 400 MG PO TABS: 400.0000 mg | ORAL_TABLET | Freq: Four times a day (QID) | ORAL | 0 refills | Status: AC | PRN

## 2016-04-05 MED ORDER — COLLAGENASE 250 UNIT/GM EX OINT: TOPICAL_OINTMENT | Freq: Every day | CUTANEOUS | 0 refills | Status: DC

## 2016-04-05 MED ORDER — ACETAMINOPHEN 325 MG PO TABS: 650.0000 mg | ORAL_TABLET | Freq: Four times a day (QID) | ORAL | 0 refills | Status: DC | PRN

## 2016-04-05 NOTE — Progress Notes (Signed)
Inpatient Diabetes Program Recommendations  AACE/ADA: New Consensus Statement on Inpatient Glycemic Control (2015)  Target Ranges:  Prepandial:   less than 140 mg/dL      Peak postprandial:   less than 180 mg/dL (1-2 hours)      Critically ill patients:  140 - 180 mg/dL   Lab Results  Component Value Date   GLUCAP 335 (H) 04/05/2016   HGBA1C 10.3 03/06/2016    Review of Glycemic Control Diabetes history:DM2 (dx 03/06/16) Outpatient Diabetes medications: Metformin 1000 mg BID Current orders for Inpatient glycemic control: Lantus 30 +Novolog 0-20 units TID with meals, Novolog 0-5 units QHS  Inpatient Diabetes Program Recommendations: Noted CBGs>300 while on increased steroids. While on large doses of steroids, please consider adding Novolog 5-7 units meal coverage if eats 50%.  Thank you, Billy Fischer. Annye Forrey, RN, MSN, CDE Inpatient Glycemic Control Team Team Pager [phone] (8am-5pm) 04/05/2016 8:51 AM

## 2016-04-05 NOTE — Consult Note (Signed)
WOC Nurse wound follow up Pt seen today for follow up of multiple wounds to BLE and sacrum Wound type:  Stage 2 pressure injury to Right gluteal combination friction/shear and MASD  Stage 2 pressure injury to Left gluteal combination friction/shear and MASD  Left lateral leg partial thickness venous ulcer: Healed  Left medial leg venous ulcer  Right lateral leg venous ulcer  Stage 2 pressure injury to right heel  Scattered friction/Shear and MASD areas to bilateral posterior thighs  Pressure Ulcer POA: x4 Stage 2 pressure injury to sacrum- YES  Stage 2 pressure injury to Right gluteal combination friction/shear and MASD-YES  Stage 2 pressure injury to right to Left gluteal combination friction/shear and MASD-YES  Stage 2 pressure injury to right heel-YES  Measurement: Wounds not measured today  Wound bed: right posterior lateral leg wound is pink with clean wound bed. No drainage   There is no intact skin now between the sacrum and right gluteal wound. 100% pink non granular tissue with moderate amount of serous sanguineous drainage, no odor. Peri wound with dark discoloration   Stage 2 pressure injury to right to Left gluteal combination friction/shear and MASD: 30% soft moist black tissue, 70% pink non granular tissue. Moderate amount of serous sanguineous drainage present, no odor. periwound with dark discoloration is improving  Stage 2 pressure injury to right heel: wound bed pink, drainage moderate amount of sanguinous drainage  Scattered friction/Shear and MASD areas to bilateral posterior thighs:improving small amount serous sanguinous drainage, no odor, periwound intact and no longer erythematous. ABD pad present but area will likely do well with application of barrier ointment only  Left medial leg: area is healed except for new area of eschar  Dressing procedure/placement/frequency: Stage 2 pressure injury to Right gluteal.sacrum area  combination friction/shear and MASD: Apply enzymatic debridement ointment 1/4 inch thick, cover with bordered foam dressing. Change daily  Stage 2 pressure injury to right to Left gluteal combination friction/shear and MASD: cover with bordered foam dressing. Change daily  Left medial leg venous ulcer: 1/4 inch think layer of santyl , cover with ABD pad,dry compression wrap with 2 layer (kerlix and coban)   Right lateral leg venous ulcer: ,dry compression wrap with 2 layer (kerlix and coban)   Stage 2 pressure injury to right heel: bordered foam dressing  Scattered friction/Shear and MASD areas to bilateral posterior thighs: apply barrier ointment atleast every shift and after every episode of incontinence   Change compression wraps and topical therapy to BLE atleast twice weekly and prn with shower day.   Patient will be discharging to [facility]. Low air loss mattress recommended at [facility] and when patient returns home  Prevalon Boots to Bilateral feet when in bed  Discussed possibility of suprapubic catheter need to maintain skin integrity. She can follow up with neurologist  Durel Salts FNP-C , [employer] [facility] student  Discussed POC with patient and bedside nurse.  Re consult if needed, will not follow at this time. Thanks  Marija Calamari [employer], RN,CWOCN, CNS [phone])

## 2016-04-05 NOTE — Progress Notes (Signed)
Patient ID: Barbara [name], female   DOB: 1955/07/27, 60 y.o.   MRN: [id number] I was asked to review the patient's case and her shoulder MRI.  She has a full-thickness rotator cuff with retraction.  The MRI also shows synovitis and early muscle atrophy.  She does not ambulate, but doe use her arms to transfer.  She is significantly obese as well.  She is unfortunately not a candidate for any type of rotator cuff repair given her co-morbidities and her body habitus as well as the retraction of the cuff.  A re-rupture rate, if a repair is even possible, is too high in this scenario.

## 2016-04-05 NOTE — Consult Note (Signed)
            [facility] CM Primary Care Navigator  04/05/2016  Barbara [name] 04-11-1956 [id number]  Went to see patient at the bedside to identify possible discharge needs. Patient was on the phone and had asked to come back later.  Will try to meet with patient at another time when she is available.   For additional questions please contact:  Karin Golden A. Jimmey Hengel, BSN, RN-BC [facility] PRIMARY CARE Navigator Cell: [phone]

## 2016-04-05 NOTE — Clinical Social Work Note (Addendum)
CSW facilitated patient discharge including contacting patient family (left voicemail for sister, Eber JonesCarolyn and spoke with patient's sister-in-law in person this morning) and facility to confirm patient discharge plans. Clinical information faxed to facility and family agreeable with plan. CSW arranged [facility]. RN to call report prior to discharge [phone] Room 107A).  CSW will sign off for now as social work intervention is no longer needed. Please consult us again if new needs arise.  Charlynn CourtSarah Daryn Pisani, CSW [phone]

## 2016-04-05 NOTE — Clinical Social Work Placement (Signed)
   CLINICAL SOCIAL WORK PLACEMENT  NOTE  Date:  04/05/2016  Patient Details  Name: Barbara [name] MRN: [id number] Date of Birth: 06/17/1955  Clinical Social Work is seeking post-discharge placement for this patient at the Skilled  Nursing Facility level of care (*CSW will initial, date and re-position this form in  chart as items are completed):  Yes   Patient/family provided with [facility] Clinical Social Work Department's list of facilities offering this level of care within the geographic area requested by the patient (or if unable, by the patient's family).  Yes   Patient/family informed of their freedom to choose among providers that offer the needed level of care, that participate in Medicare, Medicaid or managed care program needed by the patient, have an available bed and are willing to accept the patient.  Yes   Patient/family informed of [facility]'s ownership interest in [facility] and [facility], as well as of the fact that they are under no obligation to receive care at these facilities.  PASRR submitted to EDS on 04/02/16     PASRR number received on       Existing PASRR number confirmed on 04/02/16     FL2 transmitted to all facilities in geographic area requested by pt/family on 04/02/16     FL2 transmitted to all facilities within larger geographic area on       Patient informed that his/her managed care company has contracts with or will negotiate with certain facilities, including the following:        Yes   Patient/family informed of bed offers received.  Patient chooses bed at [facility] [place]     Physician recommends and patient chooses bed at      Patient to be transferred to [facility] [place] on 04/05/16.  Patient to be transferred to facility by [facility]     Patient family notified on 04/05/16 of transfer.  Name of family member notified:  Edward Jollyarolyn Collins (Left voicemail)     PHYSICIAN Please prepare prescriptions      Additional Comment:    _______________________________________________ Margarito LinerSarah C Tully Mcinturff, LCSW 04/05/2016, 2:40 PM

## 2016-04-05 NOTE — Care Management Important Message (Signed)
Important Message  Patient Details  Name: Barbara [name] MRN: [id number] Date of Birth: 05-12-1955   Medicare Important Message Given:  Yes    [name] 04/05/2016, 1:22 PM

## 2016-04-05 NOTE — Clinical Social Work Note (Addendum)
CSW received voicemail from RN last night about patient being confused about why [facility] was chosen. CSW went to meet with patient this morning. Sister at bedside. Patient and her sister about how [facility] was selected. CSW explained that on the day of assessment, that was her first choice. Patient's sister stated that this changed yesterday after reviewing reviews for other facilities. Their first preferences were [facility] and [facility]. CSW explained that both [facility] facilities typically only take individuals that live in their ALF or ILF and [facility] declined extending a bed offer. With this information, both patient and her sister are agreeable to [facility]. Patient's sister reported that [facility] told her she would only have a bed for two weeks. CSW explained that patient may transfer to another facility or discharge home with home health. Patient agreeable.   MD updated and confirmed plan for discharge today.  Charlynn Court, CSW [phone]  2:24 pm Discharge summary entered and sent to SNF. CSW went to talk to patient about [facility] and she states that she is concerned about going to SNF and using her right shoulder. CSW paged MD at 2:14 pm. Awaiting response.  Charlynn Court, CSW [phone]  3:02 pm Spoke with MD at 2:31 pm who stated that patient is still stable to discharge to SNF today. In regards to her shoulder, they just do not want her to overwork it. CSW notified patient and she is agreeable.   CSW signing off as transport has been called for discharge to [facility] [facility].  Charlynn Court, CSW [phone]

## 2016-04-05 NOTE — Progress Notes (Signed)
Report called to Efraim Kaufmann, RN at [facility]. Discharged by [facility] in good condition.

## 2016-04-07 DIAGNOSIS — I878 Other specified disorders of veins: Secondary | ICD-10-CM | POA: Diagnosis not present

## 2016-04-07 DIAGNOSIS — G35 Multiple sclerosis: Secondary | ICD-10-CM | POA: Diagnosis not present

## 2016-04-07 DIAGNOSIS — E119 Type 2 diabetes mellitus without complications: Secondary | ICD-10-CM | POA: Diagnosis not present

## 2016-04-08 ENCOUNTER — Other Ambulatory Visit: Payer: Self-pay | Admitting: *Deleted

## 2016-04-08 NOTE — Patient Outreach (Addendum)
[facility] Care Management follow up. Chart reviewed. Noted Barbara [name] discharged to [facility] SNF on 04/05/16. Will alert [facility] Community team of Barbara [name]'s disposition.  Raiford Noble, MSN-Ed, RN,BSN [facility] Liaison [phone]

## 2016-04-09 ENCOUNTER — Other Ambulatory Visit: Payer: Self-pay | Admitting: *Deleted

## 2016-04-09 NOTE — Patient Outreach (Signed)
[facility]) Care Management  04/09/2016  Aneyah Molis [birth year] [id number]   CSW received referral as patient was admitted from hospital to [facility] SNF. CSW left HIPPA compliant message for patient and will await call back or try again later this week.   Reece Levy, MSW, LCSW Clinical Social Worker  [facility] [phone]

## 2016-04-12 ENCOUNTER — Other Ambulatory Visit: Payer: Self-pay | Admitting: *Deleted

## 2016-04-12 ENCOUNTER — Other Ambulatory Visit: Payer: PPO | Admitting: *Deleted

## 2016-04-12 NOTE — Patient Outreach (Signed)
[facility]) Care Management  04/12/2016  Barbara [name] Oct 23, 1955 [id number]   CSW met with patient at [facility] SN[facility]- discussed [facility] program and CSW role- patient agrees to these services and signed consent while in hospital.  Patient reports she was admitted to SNF for physical rehab and wound care- she is hopeful to return home in the next few weeks and is midful of her copay which begins after 20 days  (she reports this to be $300/day beginning on day 21 at SNF).  She reports she is progressing with  PT and OT; she hopes to be back to her baseline when released to home which was a pivot level. Patient has great support from her siblings; 3 sisters and brother.  CSW spoke with [facility] SNF social worker and will plan another visit next week for completion of assessment and further dc planning with patient and SNF staff.       [facility] CM Care Plan Problem One   Flowsheet Row Most Recent Value  Care Plan Problem One  (P) Patient is in [facility] for rehab. [Patient is in [facility] SNF for rehab.]  Role Documenting the Problem One  (P) Clinical Social Worker  Care Plan for Problem One  (P) Active  [facility] Long Term Goal (31-90 days)  (P) Patient will return home from SNF rehab within the next 90 days.  [facility] Long Term Goal Start Date  (P) 04/12/16  Interventions for Problem One Long Term Goal  (P) CSW discussed need for rehab and planning for dc needs at home.        Eduard Clos, MSW, [place] Worker  [place] [phone]

## 2016-04-15 ENCOUNTER — Other Ambulatory Visit: Payer: Self-pay

## 2016-04-15 NOTE — Patient Outreach (Signed)
I called Ms. Backlund in regards to assistance with Copaxone.  I had to leave a HIPPA compliant message for the patient to call me back.  If I do not hear back from the patient today, I will call the patient back at a later date. This was my first attempt.    Steve Rattler, PharmD, Psychologist, occupational of [employer] [phone]

## 2016-04-16 ENCOUNTER — Other Ambulatory Visit: Payer: Self-pay

## 2016-04-16 NOTE — Patient Outreach (Signed)
I received a call from Ms. Barbara [name] in regards to a message I left her.  She stated that she currently is not taking Copaxone due to the cost.  She stated she had a grant from [employer] and another company but it was not enough to cover the entire year so she did not continue to take the medication.  She has tried to work with [employer] but has not been successful.  I stated I was willing to work with [employer] to see if I could assist her in getting assistance for her.  She stated she would appreciate it.  I will begin the paperwork/online form to see if I can get assistance for Ms. Barbara [name].  I will call her back if I need more information from her.  She stated she would be glad to help in any way.    Steve Rattlerawn Welles Walthall, PharmD, Psychologist, occupationalBCACP Assistant Clinical Director of [facility] [phone]

## 2016-04-17 ENCOUNTER — Ambulatory Visit: Payer: Self-pay | Admitting: *Deleted

## 2016-04-19 ENCOUNTER — Other Ambulatory Visit: Payer: Self-pay | Admitting: *Deleted

## 2016-04-20 NOTE — Patient Outreach (Signed)
[facility]) Care Management  [facility] Social Work  Late Entry 04/19/2016    Barbara [name] [birth year] [id number]  Subjective:  "My insurance coverage is coming to an end"  Objective:  CSW met with patient in her SNF rehab room on 04/19/16.   Encounter Medications:  Outpatient Encounter Prescriptions as of 04/19/2016  Medication Sig  . acetaminophen (TYLENOL) 325 MG tablet Take 2 tablets (650 mg total) by mouth every 6 (six) hours as needed (mild pain, fever >100.4).  Marland Kitchen aspirin EC 325 MG tablet Take 325 mg by mouth daily.  . calcium carbonate (TUMS - DOSED IN MG ELEMENTAL CALCIUM) 500 MG chewable tablet Chew 1 tablet by mouth as needed for indigestion or heartburn.  . calcium-vitamin D (OSCAL WITH D) 500-200 MG-UNIT per tablet Take 1 tablet by mouth daily.    . Cholecalciferol (VITAMIN D3) 1000 units CAPS Take 4,000 Units by mouth daily.  . Coenzyme Q10 (CO Q 10 PO) Take 1 tablet by mouth daily.  . colchicine 0.6 MG tablet Take 0.6 mg by mouth daily as needed (gout).   . collagenase (SANTYL) ointment Apply topically daily.  . Cyanocobalamin (VITAMIN B-12 PO) Take 1 tablet by mouth daily.  . diazepam (VALIUM) 5 MG tablet TAKE 1 TABLET BY MOUTH EVERY 6 HOURS AS NEEDED FOR LEG SPASMS  . dicyclomine (BENTYL) 20 MG tablet Take 1 tablet (20 mg total) by mouth 3 (three) times daily as needed for spasms (abdominal pain).  . furosemide (LASIX) 20 MG tablet TAKE 1 TABLET BY MOUTH ONCE DAILY (Patient taking differently: TAKE 1 TABLET BY MOUTH ONCE DAILY AS NEEDED FOR FLUID.)  . gabapentin (NEURONTIN) 300 MG capsule Take 1 capsule (300 mg total) by mouth 3 (three) times daily.  Marland Kitchen ibuprofen (ADVIL,MOTRIN) 400 MG tablet Take 1 tablet (400 mg total) by mouth every 6 (six) hours as needed for headache or moderate pain.  Marland Kitchen insulin aspart (NOVOLOG) 100 UNIT/ML injection Inject 10 Units into the skin 3 (three) times daily with meals as needed for high blood sugar (if CBG > 200).  .  insulin glargine (LANTUS) 100 UNIT/ML injection Inject 0.2 mLs (20 Units total) into the skin daily.  Marland Kitchen lisinopril (PRINIVIL,ZESTRIL) 10 MG tablet TAKE 1 TABLET BY MOUTH ONCE DAILY  . Magnesium 250 MG TABS Take 250 mg by mouth daily.  . metFORMIN (GLUCOPHAGE) 1000 MG tablet One half tab two times a day for one week then one full tab twice a day thereafter. (Patient taking differently: Take 1,000 mg by mouth 2 (two) times daily. )  . methocarbamol (ROBAXIN) 750 MG tablet TAKE 2 TABLETS (1,500 MG TOTAL) BY MOUTH 3 (THREE) TIMES DAILY. (Patient taking differently: Take 750 mg by mouth 6 (six) times daily. )  . metoCLOPramide (REGLAN) 10 MG tablet Take 1 tablet (10 mg total) by mouth every 6 (six) hours as needed for nausea or vomiting.  . Multiple Vitamins-Minerals (MULTIVITAMIN PO) Take 1 tablet by mouth daily.  Marland Kitchen OVER THE COUNTER MEDICATION Take 2 drops by mouth daily. Hemp Extract  . oxybutynin (DITROPAN-XL) 5 MG 24 hr tablet Take 1 tablet (5 mg total) by mouth at bedtime.  . predniSONE (DELTASONE) 10 MG tablet Take 3 tablets (30 mg total) by mouth daily with breakfast. (Patient taking differently: Take 30-40 mg by mouth daily with breakfast. )  . traMADol (ULTRAM) 50 MG tablet TAKE 1 TABLET BY MOUTH 4 TIMES DAILY  . trolamine salicylate (ASPERCREME) 10 % cream Apply 1 application topically as needed for  muscle pain.   No facility-administered encounter medications on file as of 04/19/2016.     Functional Status:  In your present state of health, do you have any difficulty performing the following activities: 04/02/2016 07/13/2015  Hearing? N N  Vision? N N  Difficulty concentrating or making decisions? N N  Walking or climbing stairs? Y Y  Dressing or bathing? Y Y  Doing errands, shopping? N Y  Some recent data might be hidden    Fall/Depression Screening:  PHQ 2/9 Scores 03/06/2016 02/09/2015 04/20/2014 11/08/2013  PHQ - 2 Score 1 0 0 0   Assessment: Patient reports she has been advised  her insurance coverage/days will be ending soon (04/23/16 she states is the tentative date).  Patient is considering options whichh may include out of pocket SNF copay coverage, assisted living transfer or home with family and hired caregivers.  She and the SNF staff shared with CSW that she is not able to pivot and thus is being transferred  Via hoyer lift.  Her wounds she reports are healing. Plan:  Patient is considering dc options with a possible dc next Tuesday. CSW discussed at length the options with patient and she plans to discuss further with SNF staff and family. She also plans to make contact with a private duty CNA agency to discuss private caregiver arrangements if she decides to go home.  CSW discussed DME, HH and other potential home needs including lifeline and a motorized wheelchair she is currently working with [facility] to get early in January.   CSW plans SNF visit again on Monday for further planning/collaborations.        Eduard Clos, MSW, [place] Worker  [place] [phone]

## 2016-04-22 ENCOUNTER — Other Ambulatory Visit: Payer: Self-pay | Admitting: *Deleted

## 2016-04-23 ENCOUNTER — Telehealth: Payer: Self-pay

## 2016-04-23 ENCOUNTER — Telehealth: Payer: Self-pay | Admitting: Diagnostic Neuroimaging

## 2016-04-23 NOTE — Telephone Encounter (Signed)
Pt called to find out what the decision was about her rotator cuff.  She needs to know what the plan is before she goes home. Would like to talk to [name]. Please call her at [phone]. Sunday Spillers, CMA

## 2016-04-23 NOTE — Telephone Encounter (Signed)
Called and explained that I do not recommend surgical repair of torn rotator cuff.  She understands.  She will also be leaving SNF/rehab shortly after Christmas.  She will come see me when she can.  She will call after she gets home to make sure she has everything.

## 2016-04-23 NOTE — Patient Outreach (Signed)
[facility]) Care Management  [facility] Social Work  04/23/2016  Barbara [name] 05/23/1955 [id number]  Subjective:  " I think I am gonna go home later this week'.   Objective: Patient sitting up in wheelchair in SNF rehab.   Encounter Medications:  Outpatient Encounter Prescriptions as of 04/22/2016  Medication Sig  . acetaminophen (TYLENOL) 325 MG tablet Take 2 tablets (650 mg total) by mouth every 6 (six) hours as needed (mild pain, fever >100.4).  Marland Kitchen. aspirin EC 325 MG tablet Take 325 mg by mouth daily.  . calcium carbonate (TUMS - DOSED IN MG ELEMENTAL CALCIUM) 500 MG chewable tablet Chew 1 tablet by mouth as needed for indigestion or heartburn.  . calcium-vitamin D (OSCAL WITH D) 500-200 MG-UNIT per tablet Take 1 tablet by mouth daily.    . Cholecalciferol (VITAMIN D3) 1000 units CAPS Take 4,000 Units by mouth daily.  . Coenzyme Q10 (CO Q 10 PO) Take 1 tablet by mouth daily.  . colchicine 0.6 MG tablet Take 0.6 mg by mouth daily as needed (gout).   . collagenase (SANTYL) ointment Apply topically daily.  . Cyanocobalamin (VITAMIN B-12 PO) Take 1 tablet by mouth daily.  . diazepam (VALIUM) 5 MG tablet TAKE 1 TABLET BY MOUTH EVERY 6 HOURS AS NEEDED FOR LEG SPASMS  . dicyclomine (BENTYL) 20 MG tablet Take 1 tablet (20 mg total) by mouth 3 (three) times daily as needed for spasms (abdominal pain).  . furosemide (LASIX) 20 MG tablet TAKE 1 TABLET BY MOUTH ONCE DAILY (Patient taking differently: TAKE 1 TABLET BY MOUTH ONCE DAILY AS NEEDED FOR FLUID.)  . gabapentin (NEURONTIN) 300 MG capsule Take 1 capsule (300 mg total) by mouth 3 (three) times daily.  Marland Kitchen. ibuprofen (ADVIL,MOTRIN) 400 MG tablet Take 1 tablet (400 mg total) by mouth every 6 (six) hours as needed for headache or moderate pain.  Marland Kitchen. insulin aspart (NOVOLOG) 100 UNIT/ML injection Inject 10 Units into the skin 3 (three) times daily with meals as needed for high blood sugar (if CBG > 200).  . insulin glargine (LANTUS)  100 UNIT/ML injection Inject 0.2 mLs (20 Units total) into the skin daily.  Marland Kitchen. lisinopril (PRINIVIL,ZESTRIL) 10 MG tablet TAKE 1 TABLET BY MOUTH ONCE DAILY  . Magnesium 250 MG TABS Take 250 mg by mouth daily.  . metFORMIN (GLUCOPHAGE) 1000 MG tablet One half tab two times a day for one week then one full tab twice a day thereafter. (Patient taking differently: Take 1,000 mg by mouth 2 (two) times daily. )  . methocarbamol (ROBAXIN) 750 MG tablet TAKE 2 TABLETS (1,500 MG TOTAL) BY MOUTH 3 (THREE) TIMES DAILY. (Patient taking differently: Take 750 mg by mouth 6 (six) times daily. )  . metoCLOPramide (REGLAN) 10 MG tablet Take 1 tablet (10 mg total) by mouth every 6 (six) hours as needed for nausea or vomiting.  . Multiple Vitamins-Minerals (MULTIVITAMIN PO) Take 1 tablet by mouth daily.  Marland Kitchen. OVER THE COUNTER MEDICATION Take 2 drops by mouth daily. Hemp Extract  . oxybutynin (DITROPAN-XL) 5 MG 24 hr tablet Take 1 tablet (5 mg total) by mouth at bedtime.  . predniSONE (DELTASONE) 10 MG tablet Take 3 tablets (30 mg total) by mouth daily with breakfast. (Patient taking differently: Take 30-40 mg by mouth daily with breakfast. )  . traMADol (ULTRAM) 50 MG tablet TAKE 1 TABLET BY MOUTH 4 TIMES DAILY  . trolamine salicylate (ASPERCREME) 10 % cream Apply 1 application topically as needed for muscle pain.  No facility-administered encounter medications on file as of 04/22/2016.     Functional Status:  In your present state of health, do you have any difficulty performing the following activities: 04/02/2016 07/13/2015  Hearing? N N  Vision? N N  Difficulty concentrating or making decisions? N N  Walking or climbing stairs? Y Y  Dressing or bathing? Y Y  Doing errands, shopping? N Y  Some recent data might be hidden    Fall/Depression Screening:  PHQ 2/9 Scores 03/06/2016 02/09/2015 04/20/2014 11/08/2013  PHQ - 2 Score 1 0 0 0    Assessment:  CSW visited patient at [facility] SN[facility] on 04/22/16. She reports she  has spoken with the SNF dc planner again and has gotten more information about her insurance coverage, copays, etc.  Patient is leaning towards dc home later this week with family and some hired caregiver support. She also will need a hoyer lift as she is not independent or able to pivot/transfer as of yet. She is optimistic with some home therapies she will be able to regain some strength and abilities. She is communicating with local private duty care agency to hire CNA and plans to also have HH care (PT, OT and RN).  CSW discussed safety concerns with her being home alone at times; will provide her with life alert resources as well.   CSW also communicated with SNF staff and will be following up 12/20 for update on dc plans.   Patient is excited to be going home and to be home for Christmas.    Plan: Phone f/u 04/24/16 for dc update.   Reece Levy, MSW, LCSW Clinical Social Worker  [facility] [phone]

## 2016-04-23 NOTE — Telephone Encounter (Signed)
Pt called says she is going to have rt rotator cuff surgery. She is wanting to know how that will affect her MS. Please call

## 2016-04-24 ENCOUNTER — Other Ambulatory Visit: Payer: Self-pay | Admitting: *Deleted

## 2016-04-24 NOTE — Telephone Encounter (Signed)
Per Dr Marjory Lies, LVM advising she may have surgery done; he has no specific concerns. Left number for any additional questions.

## 2016-04-24 NOTE — Patient Outreach (Signed)
[facility]) Care Management  04/24/2016  Barbara [name] 08-08-1955 [id number]   CSW left message for patient as well as the SNF rep for update on dc plans; tentatively planned for dc home this week.  Will await call back or try again tomorrow.    Reece Levy, MSW, LCSW Clinical Social Worker  [facility] [phone]

## 2016-04-24 NOTE — Telephone Encounter (Signed)
Should be ok. No specific concerns. -VRP

## 2016-04-30 ENCOUNTER — Other Ambulatory Visit: Payer: Self-pay | Admitting: *Deleted

## 2016-04-30 DIAGNOSIS — G35 Multiple sclerosis: Secondary | ICD-10-CM | POA: Diagnosis not present

## 2016-04-30 DIAGNOSIS — M48062 Spinal stenosis, lumbar region with neurogenic claudication: Secondary | ICD-10-CM | POA: Diagnosis not present

## 2016-04-30 DIAGNOSIS — K591 Functional diarrhea: Secondary | ICD-10-CM | POA: Diagnosis not present

## 2016-04-30 DIAGNOSIS — M4802 Spinal stenosis, cervical region: Secondary | ICD-10-CM | POA: Diagnosis not present

## 2016-04-30 DIAGNOSIS — R531 Weakness: Secondary | ICD-10-CM | POA: Diagnosis not present

## 2016-04-30 DIAGNOSIS — G8220 Paraplegia, unspecified: Secondary | ICD-10-CM | POA: Diagnosis not present

## 2016-04-30 DIAGNOSIS — R5383 Other fatigue: Secondary | ICD-10-CM | POA: Diagnosis not present

## 2016-04-30 DIAGNOSIS — E119 Type 2 diabetes mellitus without complications: Secondary | ICD-10-CM | POA: Diagnosis not present

## 2016-04-30 NOTE — Patient Outreach (Signed)
[facility]) Care Management  04/30/2016  Barbara [name] Mar 30, 1956 [id number]   Patient is scheduled to be released from [facility] today to home with [facility] providing [facility] and PT visits and a hoyer lift via [facility]. [facility] CSW will advise [facility] team of these plans and will plan f/u call and visit if needed/desired.   Reece Levy, MSW, LCSW Clinical Social Worker  [facility] [phone]

## 2016-05-01 ENCOUNTER — Other Ambulatory Visit: Payer: Self-pay | Admitting: *Deleted

## 2016-05-01 ENCOUNTER — Encounter: Payer: Self-pay | Admitting: *Deleted

## 2016-05-01 DIAGNOSIS — G35 Multiple sclerosis: Secondary | ICD-10-CM | POA: Diagnosis not present

## 2016-05-01 DIAGNOSIS — M6281 Muscle weakness (generalized): Secondary | ICD-10-CM | POA: Diagnosis not present

## 2016-05-01 DIAGNOSIS — M48061 Spinal stenosis, lumbar region without neurogenic claudication: Secondary | ICD-10-CM | POA: Diagnosis not present

## 2016-05-01 DIAGNOSIS — L89612 Pressure ulcer of right heel, stage 2: Secondary | ICD-10-CM | POA: Diagnosis not present

## 2016-05-01 DIAGNOSIS — E119 Type 2 diabetes mellitus without complications: Secondary | ICD-10-CM | POA: Diagnosis not present

## 2016-05-01 DIAGNOSIS — M4802 Spinal stenosis, cervical region: Secondary | ICD-10-CM | POA: Diagnosis not present

## 2016-05-01 DIAGNOSIS — L89153 Pressure ulcer of sacral region, stage 3: Secondary | ICD-10-CM | POA: Diagnosis not present

## 2016-05-01 DIAGNOSIS — M75101 Unspecified rotator cuff tear or rupture of right shoulder, not specified as traumatic: Secondary | ICD-10-CM | POA: Diagnosis not present

## 2016-05-01 DIAGNOSIS — I10 Essential (primary) hypertension: Secondary | ICD-10-CM | POA: Diagnosis not present

## 2016-05-01 NOTE — Patient Outreach (Signed)
  [facility]) Care Management  [facility] Social Work  05/01/2016  Barbara [name] 1955-11-15 [id number]    CSW contacted patient today by phone who does report she is home from SNF. Her sister is there and they are awaiting [facility] RN visit from [facility].  She has also receiving a hoyer lift and a motorized wheelchair from [facility] this week.  CSW will advise the [facility] team (Pharmacy and RN) of her return to home.  CSW will plan f/u call next week to check in on her status and needs.   Reece Levy, MSW, LCSW Clinical Social Worker  [facility] [phone]

## 2016-05-01 NOTE — Patient Outreach (Addendum)
[facility]) Care Management  05/01/2016  Barbara [name] 1956-03-15 [id number]  Referral for : Transition of care, patient discharged from [facility] on 12/26 Diagnosis :Recent viral  Gastroenteritis  Increased lower extremity weakness related to MS flair , recent diagnosis of Diabetes, Sacral , bilateral leg wounds.   Placed initial transition of care call to patient . Hipaa information verified. Explained reason for the call.  Patient agreeable to [facility] community care follow up.  Mrs.Barbara [name] reported that [facility]  home health RN made initial visit on today and anticipates next visit on Friday, 12/29, RN changed dressings on sacral area and legs on today, but states her sister that has been staying with her has been trained to change dressing. Patient discussed that currently her family is staying with her 24 hours a day, but she will be looking into hiring personal care assistance to stay with her when they are not able to be there, and depending on the cost how much time she will be able to hire help for. Patient discussed being hopeful that her legs will get stronger with continued therapy  and she will be able to do a little more.   Patient reports being pleased that she was able to be in her wheelchair on today, that her sister was able to use hoyer lift to get her out of bed. Patient excited that she will get her power wheelchair on Friday.  Patient was recently discharged from hospital and all medications have been reviewed.  Patient discussed problem with transporting to MD visits related to unable to get into car, and the cost of transportation services. Patient discussed she was thinking about asking Dr.Hensel about doing her visit over the phone/or mychart.   Patient reports her blood sugar was 131 on today,toleratng diet, and taking medications as prescribed.   Plan Patient will remain active with transition of care program and receive weekly outreach follow  up, initial home visit scheduled in the next week. Will discuss with [facility] LCSW transportation services for MD visits.    [facility] CM Care Plan Problem One   Flowsheet Row Most Recent Value  Care Plan Problem One  Patient recently discharged from SNF for rehab after hospital admission for MS flare   Role Documenting the Problem One  Care Management Coordinator  Care Plan for Problem One  Active  THN Long Term Goal (31-90 days)  Patient will not experience a hospital readmission in the next 60 days   THN Long Term Goal Start Date  05/01/16  Interventions for Problem One Long Term Goal  Discussed [facility]N tranisiton of care program, provided by contact information, reinforced following discharge instructions, taking medications as prescribed.   THN CM Short Term Goal #1 (0-30 days)  Patient will report attending PCP follow up in the next 2 weeks   THN CM Short Term Goal #1 Start Date  05/01/16  Interventions for Short Term Goal #1  Reminded of importance of timely MD follow up after hospitalization, discussed barriers related to transporation and cost , ,inbasket message to [facility] LCSW regarding resources  ,   THN CM Short Term Goal #2 (0-30 days)  Patient will report checking and recording blood sugar at least daily and keeping a record   THN CM Short Term Goal #2 Start Date  05/01/16  Interventions for Short Term Goal #2  Discussed importance of keeping a record and notifying MD of       Egbert Garibaldi, RN, [facility] Care Management  [place]

## 2016-05-02 ENCOUNTER — Telehealth: Payer: Self-pay | Admitting: Family Medicine

## 2016-05-02 NOTE — Telephone Encounter (Signed)
If this is verbal orders for PT for her rotator cuff, then yes, please go ahead and order. If itis for something else, let me know THANKS! Denny Levy

## 2016-05-02 NOTE — Telephone Encounter (Signed)
Jojo would like verbal orders for PT, once a week for one week and then twice a week for eight weeks.ep

## 2016-05-03 DIAGNOSIS — L89612 Pressure ulcer of right heel, stage 2: Secondary | ICD-10-CM | POA: Diagnosis not present

## 2016-05-03 DIAGNOSIS — M48061 Spinal stenosis, lumbar region without neurogenic claudication: Secondary | ICD-10-CM | POA: Diagnosis not present

## 2016-05-03 DIAGNOSIS — E119 Type 2 diabetes mellitus without complications: Secondary | ICD-10-CM | POA: Diagnosis not present

## 2016-05-03 DIAGNOSIS — M6281 Muscle weakness (generalized): Secondary | ICD-10-CM | POA: Diagnosis not present

## 2016-05-03 DIAGNOSIS — G35 Multiple sclerosis: Secondary | ICD-10-CM | POA: Diagnosis not present

## 2016-05-03 DIAGNOSIS — M4802 Spinal stenosis, cervical region: Secondary | ICD-10-CM | POA: Diagnosis not present

## 2016-05-03 DIAGNOSIS — M75101 Unspecified rotator cuff tear or rupture of right shoulder, not specified as traumatic: Secondary | ICD-10-CM | POA: Diagnosis not present

## 2016-05-03 DIAGNOSIS — I10 Essential (primary) hypertension: Secondary | ICD-10-CM | POA: Diagnosis not present

## 2016-05-03 DIAGNOSIS — L89153 Pressure ulcer of sacral region, stage 3: Secondary | ICD-10-CM | POA: Diagnosis not present

## 2016-05-03 MED FILL — LANTUS 100 UNITS/ML VIAL: 100 | 50 days supply | Qty: 10 | Fill #0

## 2016-05-03 MED FILL — traMADol HCL 50 MG TABS: 50 | 30 days supply | Qty: 120 | Fill #0

## 2016-05-03 MED FILL — NovoLOG 100 UNIT/ML SOLN: 100 | 30 days supply | Qty: 10 | Fill #0

## 2016-05-03 MED FILL — ONE TOUCH ULTRA 2 GLUCOSE S: W/DEVICE | 30 days supply | Qty: 1 | Fill #0

## 2016-05-03 NOTE — Telephone Encounter (Signed)
LM for Barbara [name] to call our office and let us know what the PT is for. Sunday Spillers, CMA

## 2016-05-08 ENCOUNTER — Ambulatory Visit: Payer: Self-pay | Admitting: *Deleted

## 2016-05-08 DIAGNOSIS — L89153 Pressure ulcer of sacral region, stage 3: Secondary | ICD-10-CM | POA: Diagnosis not present

## 2016-05-08 DIAGNOSIS — M4802 Spinal stenosis, cervical region: Secondary | ICD-10-CM | POA: Diagnosis not present

## 2016-05-08 DIAGNOSIS — M48061 Spinal stenosis, lumbar region without neurogenic claudication: Secondary | ICD-10-CM | POA: Diagnosis not present

## 2016-05-08 DIAGNOSIS — E119 Type 2 diabetes mellitus without complications: Secondary | ICD-10-CM | POA: Diagnosis not present

## 2016-05-08 DIAGNOSIS — I10 Essential (primary) hypertension: Secondary | ICD-10-CM | POA: Diagnosis not present

## 2016-05-08 DIAGNOSIS — M6281 Muscle weakness (generalized): Secondary | ICD-10-CM | POA: Diagnosis not present

## 2016-05-08 DIAGNOSIS — G35 Multiple sclerosis: Secondary | ICD-10-CM | POA: Diagnosis not present

## 2016-05-08 DIAGNOSIS — M75101 Unspecified rotator cuff tear or rupture of right shoulder, not specified as traumatic: Secondary | ICD-10-CM | POA: Diagnosis not present

## 2016-05-08 DIAGNOSIS — L89612 Pressure ulcer of right heel, stage 2: Secondary | ICD-10-CM | POA: Diagnosis not present

## 2016-05-08 NOTE — Telephone Encounter (Signed)
Called and given verbal orders for PT as requested.

## 2016-05-09 ENCOUNTER — Other Ambulatory Visit: Payer: Self-pay | Admitting: *Deleted

## 2016-05-09 ENCOUNTER — Ambulatory Visit: Payer: Self-pay | Admitting: *Deleted

## 2016-05-09 DIAGNOSIS — M4802 Spinal stenosis, cervical region: Secondary | ICD-10-CM | POA: Diagnosis not present

## 2016-05-09 DIAGNOSIS — G35 Multiple sclerosis: Secondary | ICD-10-CM | POA: Diagnosis not present

## 2016-05-09 DIAGNOSIS — I10 Essential (primary) hypertension: Secondary | ICD-10-CM | POA: Diagnosis not present

## 2016-05-09 DIAGNOSIS — M75101 Unspecified rotator cuff tear or rupture of right shoulder, not specified as traumatic: Secondary | ICD-10-CM | POA: Diagnosis not present

## 2016-05-09 DIAGNOSIS — M6281 Muscle weakness (generalized): Secondary | ICD-10-CM | POA: Diagnosis not present

## 2016-05-09 DIAGNOSIS — L89153 Pressure ulcer of sacral region, stage 3: Secondary | ICD-10-CM | POA: Diagnosis not present

## 2016-05-09 DIAGNOSIS — L89612 Pressure ulcer of right heel, stage 2: Secondary | ICD-10-CM | POA: Diagnosis not present

## 2016-05-09 DIAGNOSIS — E119 Type 2 diabetes mellitus without complications: Secondary | ICD-10-CM | POA: Diagnosis not present

## 2016-05-09 DIAGNOSIS — M48061 Spinal stenosis, lumbar region without neurogenic claudication: Secondary | ICD-10-CM | POA: Diagnosis not present

## 2016-05-09 NOTE — Patient Outreach (Signed)
[facility]) Care Management  05/09/2016  Barbara [name] March 16, 1956 [id number]   Spoke with patient regarding scheduled home visit on today, patient requested return call in am to reschedule visit. Patient reports she has a home health physical therapist and bath aide visit on today, and it will be better to reschedule visit at another time. Patient reports tiring easily still resting in bed and not able to get to her calendar.  Plan Will follow up with transition of care telephone call on next day, to reschedule visit and complete telephone visit.    Egbert Garibaldi, RN, [facility] Care Management [phone]- Mobile [phone]- Toll Free Main Office

## 2016-05-10 ENCOUNTER — Other Ambulatory Visit: Payer: Self-pay | Admitting: *Deleted

## 2016-05-10 NOTE — Patient Outreach (Signed)
[facility]) Care Management  05/10/2016  Barbara [name] 09/17/1955 [id number]   Transition of care call  Spoke with patient reports she is feeling much better today than on yesterday and this week. Patient discussed that she has had nausea and vomiting earlier on this week she is not sure whether it is a virus or related to medication,she  discussed metformin is the only new medication that she thinks she is taking but she started that while in the hospital.  Patient discussed that she is not sure how much of her medication stayed down earlier this week. .  Discussed with patient if she had made her MD aware of concerns , she states no, discussed importance of notifying MD sooner of new symptoms to address issue sooner, I stated I would notify MD she states no she wanted to do it. Patient discussed she is also waiting on [facility] to arrive at anytime for visit today.  Patient reports no vomiting/diarrhea on today she was able to eat cereal early this morning and has sipping water today and tolerating so far on today.   Patient states she has not been taking her insulin this week or checking her blood sugar. States she is new to insulin and the blood sugar meter but home health helped her. Patient discussed she is does not have 24 assistance her sister is there in the evening , sister and neighbors check on her during the day.  Patient states she is currently out of bed , sitting in chair she has received her power wheel chair on last week.   Plan Home visit scheduled for next week Patient to notify MD of new or worsening of symptoms.  Will follow up  Reece Levy ,LCSW regarding community  resources .   Egbert Garibaldi, RN, [facility] Care Management [phone]- Mobile [phone]- Toll Free Main Office

## 2016-05-13 ENCOUNTER — Encounter: Payer: Self-pay | Admitting: Family Medicine

## 2016-05-13 DIAGNOSIS — E119 Type 2 diabetes mellitus without complications: Secondary | ICD-10-CM

## 2016-05-14 ENCOUNTER — Encounter: Payer: Self-pay | Admitting: *Deleted

## 2016-05-14 ENCOUNTER — Other Ambulatory Visit: Payer: Self-pay | Admitting: *Deleted

## 2016-05-14 MED ORDER — INSULIN GLARGINE 100 UNIT/ML SOLOSTAR PEN: 20.0000 [IU] | PEN_INJECTOR | Freq: Every day | SUBCUTANEOUS | 11 refills | Status: DC

## 2016-05-14 MED ORDER — GLUCOSE BLOOD VI STRP: ORAL_STRIP | 12 refills | Status: DC

## 2016-05-14 MED ORDER — INSULIN ASPART 100 UNIT/ML FLEXPEN: PEN_INJECTOR | SUBCUTANEOUS | 11 refills | Status: DC

## 2016-05-14 MED FILL — LANTUS SOLOSTAR 100 UNITS/M: 100 | 75 days supply | Qty: 15 | Fill #0

## 2016-05-14 MED FILL — NOVOLOG FLEXPEN SYRINGE: 100 | 50 days supply | Qty: 15 | Fill #0

## 2016-05-14 NOTE — Patient Outreach (Signed)
[facility]) Care Management  05/14/2016  Lyrics Guerrier 10/25/1955 [id number]   Request received from Reece Levy, LCSW to mail patient [facility] brochure and private agency sitter list.   Sherle Poe, Conrad Tazlina  [facility] Care Management Assistant

## 2016-05-14 NOTE — Patient Outreach (Addendum)
[facility]) Care Management   05/14/2016  Barbara [name] 11-11-1955 [id number]  Barbara [name] is an 61 y.o. female  Subjective:  Patient discussed her recent SNF rehab stay. Patient discussed her problem with nausea and vomiting on last week, patient stopped taking her metformin on Saturday and states no vomiting or diarrhea since then, she attributes her recent GI issues since beginning metformin.   Patient reports she has a new one touch meter but it has limited strips and lancets , but needs prescription for strips, lancets she is currently using meter and strips she received at [facility].  Patient has some insulin in pen available but   Patient discussed her concern regarding arranging personal care assistance when her family, home health services at not at her home.  Patient also discussed issue with transportation to appointment since she is not able to ride in a car.   Patient states home health RN visits, initial bath aide visit for today and anticipates Physical therapy visit this week. Patient asking questions regarding getting a 2nd pad for hoyer lift for when she takes a shower , an electric bed with special air flow mattress with  trapeze bar.     Objective:  BP 112/70 (BP Location: Left Arm, Patient Position: Sitting)   Pulse 88   Resp 18   LMP 10/07/2010   SpO2 90%   Review of Systems  Constitutional: Negative.   HENT: Negative.   Eyes: Negative.   Respiratory: Negative.   Cardiovascular: Positive for leg swelling.  Gastrointestinal: Negative.   Genitourinary: Negative.   Musculoskeletal:       Generalized pain  Skin: Negative.   Neurological:       Weakness lower leg   Endo/Heme/Allergies: Negative.   Psychiatric/Behavioral: Negative.     Physical Exam  Constitutional: She is oriented to person, place, and time. She appears well-developed and well-nourished.  Cardiovascular: Normal rate, regular rhythm and normal heart sounds.     Respiratory: Effort normal and breath sounds normal.  GI: Soft. Bowel sounds are normal.  Neurological: She is alert and oriented to person, place, and time.  Lower leg weakness, unable to move legs   Skin: Skin is warm and dry.     Bilateral coban wraps to legs, dressing changed by [facility] today  Psychiatric: She has a normal mood and affect. Her behavior is normal. Judgment and thought content normal.    Encounter Medications:   Outpatient Encounter Prescriptions as of 05/14/2016  Medication Sig  . aspirin EC 325 MG tablet Take 325 mg by mouth daily.  . calcium carbonate (TUMS - DOSED IN MG ELEMENTAL CALCIUM) 500 MG chewable tablet Chew 1 tablet by mouth as needed for indigestion or heartburn.  . calcium-vitamin D (OSCAL WITH D) 500-200 MG-UNIT per tablet Take 1 tablet by mouth daily.    . Cholecalciferol (VITAMIN D3) 1000 units CAPS Take 4,000 Units by mouth daily.  . Coenzyme Q10 (CO Q 10 PO) Take 1 tablet by mouth daily.  . collagenase (SANTYL) ointment Apply topically daily.  . Cyanocobalamin (VITAMIN B-12 PO) Take 1 tablet by mouth daily.  . diazepam (VALIUM) 5 MG tablet TAKE 1 TABLET BY MOUTH EVERY 6 HOURS AS NEEDED FOR LEG SPASMS  . furosemide (LASIX) 20 MG tablet TAKE 1 TABLET BY MOUTH ONCE DAILY (Patient taking differently: No sig reported)  . gabapentin (NEURONTIN) 300 MG capsule Take 1 capsule (300 mg total) by mouth 3 (three) times daily.  . insulin aspart (NOVOLOG) 100 UNIT/ML  FlexPen Inject 10 Units into the skin 3 (three) times daily with meals as needed for high blood sugar (if CBG > 200).  . Insulin Glargine (LANTUS) 100 UNIT/ML Solostar Pen Inject 20 Units into the skin daily at 10 pm.  . lisinopril (PRINIVIL,ZESTRIL) 10 MG tablet TAKE 1 TABLET BY MOUTH ONCE DAILY  . Magnesium 250 MG TABS Take 250 mg by mouth daily.  . methocarbamol (ROBAXIN) 750 MG tablet TAKE 2 TABLETS (1,500 MG TOTAL) BY MOUTH 3 (THREE) TIMES DAILY. (Patient taking differently: Take 750 mg by mouth 6  (six) times daily. )  . Multiple Vitamins-Minerals (MULTIVITAMIN PO) Take 1 tablet by mouth daily.  Marland Kitchen oxybutynin (DITROPAN-XL) 5 MG 24 hr tablet Take 1 tablet (5 mg total) by mouth at bedtime.  . predniSONE (DELTASONE) 10 MG tablet Take 3 tablets (30 mg total) by mouth daily with breakfast. (Patient taking differently: Take 30-40 mg by mouth daily with breakfast. )  . traMADol (ULTRAM) 50 MG tablet TAKE 1 TABLET BY MOUTH 4 TIMES DAILY  . trolamine salicylate (ASPERCREME) 10 % cream Apply 1 application topically as needed for muscle pain.  . [DISCONTINUED] insulin aspart (NOVOLOG) 100 UNIT/ML injection Inject 10 Units into the skin 3 (three) times daily with meals as needed for high blood sugar (if CBG > 200).  . [DISCONTINUED] insulin glargine (LANTUS) 100 UNIT/ML injection Inject 0.2 mLs (20 Units total) into the skin daily.  Marland Kitchen acetaminophen (TYLENOL) 325 MG tablet Take 2 tablets (650 mg total) by mouth every 6 (six) hours as needed (mild pain, fever >100.4). (Patient not taking: Reported on 05/14/2016)  . colchicine 0.6 MG tablet Take 0.6 mg by mouth daily as needed (gout).   Marland Kitchen dicyclomine (BENTYL) 20 MG tablet Take 1 tablet (20 mg total) by mouth 3 (three) times daily as needed for spasms (abdominal pain). (Patient not taking: Reported on 05/14/2016)  . ibuprofen (ADVIL,MOTRIN) 400 MG tablet Take 1 tablet (400 mg total) by mouth every 6 (six) hours as needed for headache or moderate pain. (Patient not taking: Reported on 05/14/2016)  . metoCLOPramide (REGLAN) 10 MG tablet Take 1 tablet (10 mg total) by mouth every 6 (six) hours as needed for nausea or vomiting. (Patient not taking: Reported on 05/14/2016)  . OVER THE COUNTER MEDICATION Take 2 drops by mouth daily. Hemp Extract  . [DISCONTINUED] metFORMIN (GLUCOPHAGE) 1000 MG tablet One half tab two times a day for one week then one full tab twice a day thereafter. (Patient not taking: Reported on 05/14/2016)   No facility-administered encounter medications  on file as of 05/14/2016.     Functional Status:   In your present state of health, do you have any difficulty performing the following activities: 05/10/2016 04/02/2016  Hearing? N N  Vision? N N  Difficulty concentrating or making decisions? N N  Walking or climbing stairs? Y Y  Dressing or bathing? Y Y  Doing errands, shopping? Y N  Preparing Food and eating ? Y -  Using the Toilet? Y -  In the past six months, have you accidently leaked urine? Y -  Do you have problems with loss of bowel control? Y -  Managing your Medications? N -  Managing your Finances? N -  Housekeeping or managing your Housekeeping? Y -  Some recent data might be hidden    Fall/Depression Screening:    PHQ 2/9 Scores 05/14/2016 03/06/2016 02/09/2015 04/20/2014 11/08/2013  PHQ - 2 Score 0 1 0 0 0    Assessment:  Transition of care home visit. [facility] home health RN present at beginning of visit completing sacral wound dressing, has already changed dressing on bilateral legs.    Diabetes - Inconsistency with checking daily blood sugars, needs strips and lancets for new one touch meter, reports taking insulin but needs refills . Patient has corresponded with MD by e-mail regarding her not taking her metformin due to intolerance. Will benefit from continued education and management of diabetes Blood sugar during visit 207, denies symptoms of hypoglycemia. .   Wounds -  Concern per home health RN regarding wound on sacral area not improving, patient with incontinence prolonged time with wetness on skin. Patient will also benefit from special mattress low air flow to relieve pressure .   High Fall risk-  Patient in hospital bed most of day ,family assist patient in lift recliner chair using hoyer lift, if she gets up later in evenings sometimes she sleeps in the recliner in living room. Patient has power wheel chair but she has not sit in it yet. Physical therapy visit to begin this week. Fall prevention reviewed.    Need for Personal care assistance  Will benefit from increased assistance in home daily to assist with personal care  keeping skin clean and dry.Patient at home alone most of day, siblings in daily to see her.  Will discuss with [facility] social worker for assistance for resource contact information, patient wants to make contact on her own.  Patient needs resource for transportation to PCP visit that she hasn't scheduled yet due transportation needs via stretcher or using her power chair.   Plan:  Diabetes - MD has ordered needed supplies in response to patient message.Continue education on diabetes management, daily monitoring of blood sugars  Wounds - will send MD messages regarding patient request for total electric bed, with air flow mattress, and trapeze bar. Placed call to [facility] regarding pricing of 2nd mesh sling pad for hoyer lift for when she is able to get into shower and discuss description of type of mattress needed for bed. . High Fall Risk/Personal care needs- placed call to Reece Levy during visit to obtain additional contact information for securing personal care assistance .  Will plan weekly transition of care call in the next week.  Will send MD this visit note.   [facility] CM Care Plan Problem One   Flowsheet Row Most Recent Value  Care Plan Problem One  Patient recently discharged from SNF for rehab after hospital admission for MS flare   Role Documenting the Problem One  Care Management Coordinator  Care Plan for Problem One  Active  THN Long Term Goal (31-90 days)  Patient will not experience a hospital readmission in the next 60 days   THN Long Term Goal Start Date  05/01/16  Interventions for Problem One Long Term Goal  Reinforced importance of notifying MD of new or worsening problems, taking medicaitons as prescribed and notifing MD if she is unable to   [facility] CM Short Term Goal #1 (0-30 days)  Patient will report attending PCP follow up in the next 2 weeks   THN CM Short  Term Goal #1 Start Date  05/14/16 Staci Righter date restarted ]  Interventions for Short Term Goal #1  Discussed with LCSW transporation concerns, patient communicating with MD by email.   THN CM Short Term Goal #2 (0-30 days)  Patient will report checking and recording blood sugar at least daily and keeping a record in the next 30 days  THN CM Short Term Goal #2 Start Date  05/01/16  Interventions for Short Term Goal #2  Encouraged to continue to check blood sugar daily, provided [facility] calendar to record, verified needed supplies ordered, reviewed EMMI handout on controlling blood sugar   THN CM Short Term Goal #3 (0-30 days)  Patient will be able to restate steps to treating hypoglycemia in the next 30 days   [facility]N CM Short Term Goal #3 Start Date  05/14/16  Interventions for Short Tern Goal #3  Provided EMMI handout on treating hypoglycemia, patient to read, will review with teachback.  [facility]N CM Short Term Goal #4 (0-30 days)  Patient will report increase knowledge of chosing foods for carbohydrate modified meal planning in the next 30 days   [facility]N CM Short Term Goal #4 Start Date  05/14/16  Interventions for Short Term Goal #4  Reviewed [facility]N carbohydrate counting picture sheet, and EMMI diabetes diet infomation for review.       Egbert Garibaldi, RN, [facility] Care Management [phone]- Mobile [phone]- Toll Free Main Office

## 2016-05-15 ENCOUNTER — Other Ambulatory Visit: Payer: Self-pay | Admitting: Pharmacist

## 2016-05-15 ENCOUNTER — Telehealth: Payer: Self-pay | Admitting: Family Medicine

## 2016-05-15 NOTE — Telephone Encounter (Signed)
-----   Message from Georgette Dover, RN sent at 05/15/2016  9:46 AM EST ----- Regarding: Patient request for hospital bed Hi Dr.Hensel, I am [name] , RN care management coordinator with [facility], I visited with [name] in home on yesterday.  To help with wound healing,patient would benefit from a  pressure relief mattress surface,   [facility]   1. Fully electric bed, with Air lo mattress, and trapeze bar.. [facility]ax [facility] to [facility] [phone], please include Dx, office visit note, Demographic information ,HT/WT, copy of insurance card.    We are also working with patient on providing resource information to help arrange needed personal care assistance in home as well as transportation.  I have also sent by full visit note.  For questions Egbert Garibaldi, RN, [facility] Care Management [phone]- Mobile [phone]- Toll Free Main Office

## 2016-05-15 NOTE — Patient Outreach (Addendum)
Ms. Barbara [name] is a 61 year old female previously outreached to by pharmacist Barbara [name] regarding patient assistance. Per note in EPIC from 04/02/16 from hospital liaison Barbara [name], patient is unable to afford her MS medication, Copaxone. Per note, "she had 2 grants for the medication but it was only for a total of 3 months. States she did not continue to take the medication since she would not be able to afford the MS drug the rest of the year for the next 9 months". Call [employer] ([phone]). Per representative, given patient's insurance coverage and that her income level makes her ineligible for extra help through social security, her only assistance option is through Health and safety inspector. Request the names of these grant programs. Representative provides: The Assistance Fund, Barbara [name], and Patient [employer].   Note that per The Assistance Fund and [employer], their programs for multiple sclerosis are currently closed and not accepting new applications at this time. Per the Patient Access Network [facility]) Foundation website, their multiple sclerosis program is currently open. Call to follow up with [name] to see if she has already contacted the [employer]. Leave a HIPAA compliant message on the patient's voicemail. If have not heard from patient by next week, will give her another call at that time.  [name], PharmD, [facility] Clinical Pharmacist [facility] Care Management [phone]

## 2016-05-15 NOTE — Telephone Encounter (Signed)
I will provide RN with handwritten Rx.  Please see other records below that will need to be faxed with Rx.

## 2016-05-16 ENCOUNTER — Telehealth: Payer: Self-pay | Admitting: Family Medicine

## 2016-05-16 ENCOUNTER — Ambulatory Visit: Payer: Self-pay | Admitting: *Deleted

## 2016-05-16 ENCOUNTER — Telehealth: Payer: Self-pay | Admitting: *Deleted

## 2016-05-16 MED ORDER — INSULIN PEN NEEDLE 31G X 8 MM MISC: 6 refills | Status: DC

## 2016-05-16 MED FILL — ONE TOUCH ULTRA TEST STRIPS: 50 days supply | Qty: 100 | Fill #0

## 2016-05-16 MED FILL — ONE TOUCH ULTRASOFT LANCETS: 50 days supply | Qty: 100 | Fill #0

## 2016-05-16 NOTE — Telephone Encounter (Signed)
Spoke with Morrie Sheldon at [facility] regarding faxed request for brand of glucometer. Morrie Sheldon made aware that patient uses a One Touch glucometer.  Morrie Sheldon requesting refill for pen needles for novolog. Note routed to PCP. Kinnie Feil, RN, BSN

## 2016-05-16 NOTE — Telephone Encounter (Signed)
FYI

## 2016-05-16 NOTE — Telephone Encounter (Signed)
All information requested below faxed to [facility] [phone]. Message sent to Henderson Newcomer at [facility] that this has been done.  Kinnie Feil, RN, BSN

## 2016-05-16 NOTE — Telephone Encounter (Signed)
Rx sent 

## 2016-05-16 NOTE — Telephone Encounter (Signed)
Noted.  Will sign bed Rx when fax comes in.

## 2016-05-16 NOTE — Telephone Encounter (Signed)
Spoke with Ander Purpura, RN at [facility] who is working with patient since discharge from SNF. Discussed feasibility of patient being able to come to office for HFU/Transitions of Care appt. [facility] working with patient on transportation. [name] states that at this time patient prefers to communicate with PCP via email. Would require ambulance to transport patient to office. [name] will discuss this with patient at her next home visit.  Kim made aware that we are continuing to work on obtaining patient electric bed. Message received from [facility] that they do not carry fully electric bed or mattress that could accommodate patient's weight.  Kinnie Feil, RN, BSN

## 2016-05-16 NOTE — Telephone Encounter (Signed)
[facility] doesn't have an electric bed and air mattress that can accommodate pts weight.  They do have an semi electric bed and gel overlay. He will fax over a new prescription for dr Leveda Anna to sign

## 2016-05-17 ENCOUNTER — Other Ambulatory Visit: Payer: Self-pay | Admitting: *Deleted

## 2016-05-17 MED FILL — UNIFINE PENTIPS 8MM 31G: 31G X 8 MM | 75 days supply | Qty: 300 | Fill #0

## 2016-05-17 NOTE — Patient Outreach (Signed)
[facility]) Care Management  05/17/2016  Barbara [name] 1955/05/10 [id number]   Placed call to patient to follow up on transportation arrangements , for scheduling PCP   visit. No answer able to leave a hipaa compliant message requesting a return call.  Plan  Await return call, if no response will place return call to  patient in the next week.  Egbert Garibaldi, RN, [facility] Care Management [phone]- Mobile [phone]- Toll Free Main Office

## 2016-05-17 NOTE — Patient Outreach (Addendum)
[facility]) Care Management  [facility] Social Work  05/16/2016  Barbara [name] 1956-01-12 [id number]  Subjective:  "Doing well and happy to be home".  Objective:  Patient will need assistance with transportation to/from home for MD appointments, etc via family and/or community agency's.  Current Medications:  Current Outpatient Prescriptions  Medication Sig Dispense Refill  . acetaminophen (TYLENOL) 325 MG tablet Take 2 tablets (650 mg total) by mouth every 6 (six) hours as needed (mild pain, fever >100.4). (Patient not taking: Reported on 05/14/2016) 30 tablet 0  . aspirin EC 325 MG tablet Take 325 mg by mouth daily.    . calcium carbonate (TUMS - DOSED IN MG ELEMENTAL CALCIUM) 500 MG chewable tablet Chew 1 tablet by mouth as needed for indigestion or heartburn.    . calcium-vitamin D (OSCAL WITH D) 500-200 MG-UNIT per tablet Take 1 tablet by mouth daily.      . Cholecalciferol (VITAMIN D3) 1000 units CAPS Take 4,000 Units by mouth daily.    . Coenzyme Q10 (CO Q 10 PO) Take 1 tablet by mouth daily.    . colchicine 0.6 MG tablet Take 0.6 mg by mouth daily as needed (gout).     . collagenase (SANTYL) ointment Apply topically daily. 15 g 0  . Cyanocobalamin (VITAMIN B-12 PO) Take 1 tablet by mouth daily.    . diazepam (VALIUM) 5 MG tablet TAKE 1 TABLET BY MOUTH EVERY 6 HOURS AS NEEDED FOR LEG SPASMS 90 tablet 5  . dicyclomine (BENTYL) 20 MG tablet Take 1 tablet (20 mg total) by mouth 3 (three) times daily as needed for spasms (abdominal pain). (Patient not taking: Reported on 05/14/2016) 20 tablet 0  . furosemide (LASIX) 20 MG tablet TAKE 1 TABLET BY MOUTH ONCE DAILY (Patient taking differently: No sig reported) 90 tablet 3  . gabapentin (NEURONTIN) 300 MG capsule Take 1 capsule (300 mg total) by mouth 3 (three) times daily. 270 capsule 4  . glucose blood test strip Test blood sugar three times per day. 100 each 12  . ibuprofen (ADVIL,MOTRIN) 400 MG tablet Take 1 tablet (400 mg  total) by mouth every 6 (six) hours as needed for headache or moderate pain. (Patient not taking: Reported on 05/14/2016) 30 tablet 0  . insulin aspart (NOVOLOG) 100 UNIT/ML FlexPen Inject 10 Units into the skin 3 (three) times daily with meals as needed for high blood sugar (if CBG > 200). 15 mL 11  . Insulin Glargine (LANTUS) 100 UNIT/ML Solostar Pen Inject 20 Units into the skin daily at 10 pm. 15 mL 11  . Insulin Pen Needle 31G X 8 MM MISC Use to inject insulin 4x/day 300 each 6  . lisinopril (PRINIVIL,ZESTRIL) 10 MG tablet TAKE 1 TABLET BY MOUTH ONCE DAILY 90 tablet 3  . Magnesium 250 MG TABS Take 250 mg by mouth daily.    . methocarbamol (ROBAXIN) 750 MG tablet TAKE 2 TABLETS (1,500 MG TOTAL) BY MOUTH 3 (THREE) TIMES DAILY. (Patient taking differently: Take 750 mg by mouth 6 (six) times daily. ) 540 tablet 3  . metoCLOPramide (REGLAN) 10 MG tablet Take 1 tablet (10 mg total) by mouth every 6 (six) hours as needed for nausea or vomiting. (Patient not taking: Reported on 05/14/2016) 20 tablet 0  . Multiple Vitamins-Minerals (MULTIVITAMIN PO) Take 1 tablet by mouth daily.    Marland Kitchen OVER THE COUNTER MEDICATION Take 2 drops by mouth daily. Hemp Extract    . oxybutynin (DITROPAN-XL) 5 MG 24 hr tablet Take 1  tablet (5 mg total) by mouth at bedtime. 90 tablet 4  . predniSONE (DELTASONE) 10 MG tablet Take 3 tablets (30 mg total) by mouth daily with breakfast. (Patient taking differently: Take 30-40 mg by mouth daily with breakfast. ) 270 tablet 3  . traMADol (ULTRAM) 50 MG tablet TAKE 1 TABLET BY MOUTH 4 TIMES DAILY 360 tablet PRN  . trolamine salicylate (ASPERCREME) 10 % cream Apply 1 application topically as needed for muscle pain.     No current facility-administered medications for this visit.     Functional Status:  In your present state of health, do you have any difficulty performing the following activities: 05/10/2016 04/02/2016  Hearing? N N  Vision? N N  Difficulty concentrating or making  decisions? N N  Walking or climbing stairs? Y Y  Dressing or bathing? Y Y  Doing errands, shopping? Y N  Preparing Food and eating ? Y -  Using the Toilet? Y -  In the past six months, have you accidently leaked urine? Y -  Do you have problems with loss of bowel control? Y -  Managing your Medications? N -  Managing your Finances? N -  Housekeeping or managing your Housekeeping? Y -  Some recent data might be hidden    Fall/Depression Screening:  PHQ 2/9 Scores 05/14/2016 03/06/2016 02/09/2015 04/20/2014 11/08/2013  PHQ - 2 Score 0 1 0 0 0    Assessment:  CSW spoke with patient earlier this week and have left message today for followup. Also, have have provided community resources for transportation as requested. Patient also being sent info via mail. Patient denies any further CSW needs at this time and CSW will close case.  Patient agreeable and encouraged to seek support if needs arise.   Plan: CSW to close CSW referral and advise PCP and [facility] staff.     Reece Levy, MSW, LCSW Clinical Social Worker  [facility] [employer] [phone]

## 2016-05-20 ENCOUNTER — Other Ambulatory Visit: Payer: Self-pay | Admitting: Pharmacist

## 2016-05-20 NOTE — Patient Outreach (Signed)
Receive a phone call back from Ms. Janee Morn. HIPAA identifiers verified and verbal consent received.  Share with Ms. Brusca that per the [employer] representative that I spoke with, given patient's insurance coverage and that her income level makes her ineligible for extra help through social security, her only assistance option is through Health and safety inspector. Share with Ms. Searer the names of the organizations that [employer] identified that have such grants. Ms. Neal reports that [employer] is the organization that had previously offered her a grant.  Ms. Fujii reports that she and her Neurologist had discussed trying alternatives to Copaxone in the past. Let Ms. Rainford know that if she would like for me to reach out to any other manufacturer on her behalf, I would be happy to make that contact. Ms. Salmeron reports that she is going to send a MyChart message to her Neurologist and that she will let me know if there is an alternative that he would like to prescribe at this time.  Ms. Debes reports that she has no further medication questions/concerns for me at this time. Confirm that patient has my phone number.  Will close pharmacy episode at this time.  [name], PharmD, [facility] Clinical Pharmacist [facility] Care Management [phone]

## 2016-05-20 NOTE — Patient Outreach (Signed)
Call to follow up with Ms. Hasting regarding medication assistance for Copaxone. Outreach attempt #2. Leave a HIPAA compliant message on the patient's voicemail. If have not heard from patient by next week, will give her another call at that time.  [name], PharmD, [facility] Clinical Pharmacist [facility] Care Management [phone]

## 2016-05-21 ENCOUNTER — Other Ambulatory Visit: Payer: Self-pay | Admitting: *Deleted

## 2016-05-21 NOTE — Patient Outreach (Signed)
[facility]) Care Management  05/21/2016  Catina Nuss July 03, 1955 [id number]  Transition of care call  Spoke with patient reports she is managing at home. Patient discussed her visit today with RN,PT [facility]. Patient is currently resting in her lift recliner chair in living room, staff used hoyer lift for movement,  and her sister will check on her this afternoon. Patient states therapy indicates she has a little more muscle tone in her legs and she is very pleased with this.  Patient discussed she has received resource information from [facility] LCSW Reece Levy, regarding transportation services, personal care services, she is reviewing on today, and also making calls to a friend that uses a particular service prior to calling them.Milly prefers making the contact with agencies regarding transportation and personal care services on her own. She hopes to have a agency for transportation selected in the next week and willing to discuss post hospital visit with PCP at that time.   Diabetes -   Has all needed supplies, and checking blood sugar daily and taking insulin as prescribed, discussed yesterday her blood sugar was 183, today it was 308 checked it right after eating her meal states she forgot to check it before. Patient reports managing well with checking blood sugar and administering insulin  Hospital Bed-  Requesting follow up on orders for bed, informed patient fully electric bed not available, she is agreeable to semi-electric bed with gel overlay mattress.   Wounds - Patient reports per home health RN her sacral wound and skin is looking some better but will benefit from bed. Patient mentioned a  Discussion she had  with her [facility] regarding a catheter for short term to help skin, she discussed she is aware of the pro's and cons of this, and will discuss with MD.   Plan Will continue to follow for transition of care, next phone call within a  week , and will determine if she has selected an agency for transportation, and discuss scheduling PCP post hospital visit.  Will follow up with Candis Schatz, RN at family medicine regarding orders for bed, Spoke with Tammy and [facility] and they have faxed over new prescription for MD signature for  Semi-electric bed and gel overlay mattress and due to follow up with office again by 1/17. Will contact patient sooner with any updates regarding the bed.    Egbert Garibaldi, RN, [facility] Care Management [phone]- Mobile [phone]- Toll Free Main Office

## 2016-05-24 NOTE — Telephone Encounter (Signed)
Had messaged Barbara [name] on 05/17/2016 to resend fax as we did not receive the first one. Spoke with Bradly Bienenstock at [facility] today who stated Barbara Cower is resending fax now. Will forward to PCP for signature as soon as it is received.  Kinnie Feil, RN, BSN

## 2016-05-27 NOTE — Telephone Encounter (Signed)
Rx signed and faxed back

## 2016-05-29 ENCOUNTER — Other Ambulatory Visit: Payer: Self-pay | Admitting: *Deleted

## 2016-05-29 DIAGNOSIS — M6281 Muscle weakness (generalized): Secondary | ICD-10-CM | POA: Diagnosis not present

## 2016-05-29 DIAGNOSIS — G35 Multiple sclerosis: Secondary | ICD-10-CM | POA: Diagnosis not present

## 2016-05-29 DIAGNOSIS — M48061 Spinal stenosis, lumbar region without neurogenic claudication: Secondary | ICD-10-CM | POA: Diagnosis not present

## 2016-05-29 DIAGNOSIS — L89612 Pressure ulcer of right heel, stage 2: Secondary | ICD-10-CM | POA: Diagnosis not present

## 2016-05-29 DIAGNOSIS — M4802 Spinal stenosis, cervical region: Secondary | ICD-10-CM | POA: Diagnosis not present

## 2016-05-29 DIAGNOSIS — M75101 Unspecified rotator cuff tear or rupture of right shoulder, not specified as traumatic: Secondary | ICD-10-CM | POA: Diagnosis not present

## 2016-05-29 DIAGNOSIS — E119 Type 2 diabetes mellitus without complications: Secondary | ICD-10-CM | POA: Diagnosis not present

## 2016-05-29 DIAGNOSIS — I10 Essential (primary) hypertension: Secondary | ICD-10-CM | POA: Diagnosis not present

## 2016-05-29 DIAGNOSIS — L89153 Pressure ulcer of sacral region, stage 3: Secondary | ICD-10-CM | POA: Diagnosis not present

## 2016-05-29 NOTE — Patient Outreach (Signed)
[facility]) Care Management  05/29/2016  Barbara [name] 1955-07-01 [id number]   Transition of care call  Spoke with patient reports she is doing fairly well, she is currently out of bed in her lift chair, her sister was able to help her this morning using hoyer lift  and home health PT and bath aide will come this afternoon and assist her back to bed with hoyer lift .  DM Patient continues to check her blood sugar daily, today's reading 170,she denies having any hypoglycemic episodes.  Sacral and leg wounds She continues with home health RN doing wound care 2 to 3 days a week, on legs and sacral area. Patient discussed concern with her fragile skin and mentioned conversation she has had with home health RN's suggesting foley catheter to help protect her skin. Patient discussed she knows the pros and con's of having a catheter and wants to get MD recommendation. Patient discussed that she has not followed up with [facility] regarding co payment for bed, but she plans to, states the original call went to her sister from [facility].  Patient also has questions for [facility] regarding an additional sling for the hoyer lift.  Resources for transportation and personal care services Patient acknowledged that she list of resources, but has not followed up calling due to financially she will not be able to afford to pay for additional personal care services  Patient discussed that it will be too difficult for her to come in for a office visit at this time,stating the  strain it will be on her family to get to visit.    Plan Will in basket Rolm Baptise at [facility] practice center with patient concerns Will follow up with patient in the next week for transition of care final care and then schedule next visit. Placed call to [facility] to verify they had patient contact number and to notify patient is available to receive call at this time, and to inquire about and additional sling, returned call to patient to  notify cost of additional sling is $97, and insurance pays for one a year  Egbert Garibaldi, RN, [facility] Care Management [phone]- Mobile [phone]- Toll Free Main Office .

## 2016-05-30 ENCOUNTER — Other Ambulatory Visit: Payer: Self-pay | Admitting: *Deleted

## 2016-05-30 ENCOUNTER — Telehealth: Payer: Self-pay | Admitting: Family Medicine

## 2016-05-30 DIAGNOSIS — M4802 Spinal stenosis, cervical region: Secondary | ICD-10-CM | POA: Diagnosis not present

## 2016-05-30 DIAGNOSIS — I10 Essential (primary) hypertension: Secondary | ICD-10-CM | POA: Diagnosis not present

## 2016-05-30 DIAGNOSIS — L89612 Pressure ulcer of right heel, stage 2: Secondary | ICD-10-CM | POA: Diagnosis not present

## 2016-05-30 DIAGNOSIS — L89153 Pressure ulcer of sacral region, stage 3: Secondary | ICD-10-CM | POA: Diagnosis not present

## 2016-05-30 DIAGNOSIS — M75101 Unspecified rotator cuff tear or rupture of right shoulder, not specified as traumatic: Secondary | ICD-10-CM | POA: Diagnosis not present

## 2016-05-30 DIAGNOSIS — E119 Type 2 diabetes mellitus without complications: Secondary | ICD-10-CM | POA: Diagnosis not present

## 2016-05-30 DIAGNOSIS — M48061 Spinal stenosis, lumbar region without neurogenic claudication: Secondary | ICD-10-CM | POA: Diagnosis not present

## 2016-05-30 DIAGNOSIS — M6281 Muscle weakness (generalized): Secondary | ICD-10-CM | POA: Diagnosis not present

## 2016-05-30 DIAGNOSIS — G35 Multiple sclerosis: Secondary | ICD-10-CM | POA: Diagnosis not present

## 2016-05-30 MED ORDER — DIAZEPAM 5 MG PO TABS: ORAL_TABLET | ORAL | 5 refills | Status: DC

## 2016-05-30 MED FILL — diazePAM 5 MG TABS: 5 | 22 days supply | Qty: 90 | Fill #0

## 2016-05-30 MED FILL — traMADol HCL 50 MG TABS: 50 | 90 days supply | Qty: 360 | Fill #1

## 2016-05-30 MED FILL — predniSONE 10 MG TABS: 10 | 90 days supply | Qty: 270 | Fill #1

## 2016-05-30 MED FILL — LISINOPRIL 10 MG TABLET: 10 | 90 days supply | Qty: 90 | Fill #2

## 2016-05-30 MED FILL — FUROSEMIDE 20 MG TABLET: 20 | 90 days supply | Qty: 90 | Fill #1

## 2016-05-30 NOTE — Telephone Encounter (Signed)
[facility]:  Would like to discuss putting in a foley  catherer because pt is left for long periods of time

## 2016-05-30 NOTE — Telephone Encounter (Signed)
Spoke with patient and nurse.  No skin breakdown.  No foley for now.  Ok to insert foley if skin breakdown.

## 2016-05-31 DIAGNOSIS — G35 Multiple sclerosis: Secondary | ICD-10-CM | POA: Diagnosis not present

## 2016-05-31 DIAGNOSIS — E119 Type 2 diabetes mellitus without complications: Secondary | ICD-10-CM | POA: Diagnosis not present

## 2016-05-31 DIAGNOSIS — R531 Weakness: Secondary | ICD-10-CM | POA: Diagnosis not present

## 2016-05-31 DIAGNOSIS — M4802 Spinal stenosis, cervical region: Secondary | ICD-10-CM | POA: Diagnosis not present

## 2016-06-01 ENCOUNTER — Encounter: Payer: Self-pay | Admitting: Family Medicine

## 2016-06-03 ENCOUNTER — Encounter: Payer: Self-pay | Admitting: Family Medicine

## 2016-06-03 DIAGNOSIS — L89612 Pressure ulcer of right heel, stage 2: Secondary | ICD-10-CM | POA: Diagnosis not present

## 2016-06-03 DIAGNOSIS — G35 Multiple sclerosis: Secondary | ICD-10-CM

## 2016-06-03 DIAGNOSIS — I10 Essential (primary) hypertension: Secondary | ICD-10-CM | POA: Diagnosis not present

## 2016-06-03 DIAGNOSIS — M4802 Spinal stenosis, cervical region: Secondary | ICD-10-CM | POA: Diagnosis not present

## 2016-06-03 DIAGNOSIS — M48061 Spinal stenosis, lumbar region without neurogenic claudication: Secondary | ICD-10-CM | POA: Diagnosis not present

## 2016-06-03 DIAGNOSIS — M75101 Unspecified rotator cuff tear or rupture of right shoulder, not specified as traumatic: Secondary | ICD-10-CM | POA: Diagnosis not present

## 2016-06-03 DIAGNOSIS — E119 Type 2 diabetes mellitus without complications: Secondary | ICD-10-CM | POA: Diagnosis not present

## 2016-06-03 DIAGNOSIS — L89153 Pressure ulcer of sacral region, stage 3: Secondary | ICD-10-CM | POA: Diagnosis not present

## 2016-06-03 DIAGNOSIS — M6281 Muscle weakness (generalized): Secondary | ICD-10-CM | POA: Diagnosis not present

## 2016-06-03 NOTE — Progress Notes (Signed)
Recommended ambulatory palliative care consult.

## 2016-06-03 NOTE — Assessment & Plan Note (Signed)
Called patient and recommended palliative care consult.  She agrees.  Form completed and faxed.

## 2016-06-04 ENCOUNTER — Telehealth: Payer: Self-pay | Admitting: Family Medicine

## 2016-06-04 NOTE — Telephone Encounter (Signed)
Called and given orders.

## 2016-06-04 NOTE — Telephone Encounter (Signed)
Chris from [employer] called and is requesting verbal orders for a catheter for the patient.He needs to know the size, the size of the balloon, plus the frequency that this needs to be changed. Please call him at [phone] with orders. Also if he doesn't answer please leave a voicemail on his secured phone. jw

## 2016-06-05 DIAGNOSIS — E119 Type 2 diabetes mellitus without complications: Secondary | ICD-10-CM | POA: Diagnosis not present

## 2016-06-05 DIAGNOSIS — M6281 Muscle weakness (generalized): Secondary | ICD-10-CM | POA: Diagnosis not present

## 2016-06-05 DIAGNOSIS — M75101 Unspecified rotator cuff tear or rupture of right shoulder, not specified as traumatic: Secondary | ICD-10-CM | POA: Diagnosis not present

## 2016-06-05 DIAGNOSIS — I10 Essential (primary) hypertension: Secondary | ICD-10-CM | POA: Diagnosis not present

## 2016-06-05 DIAGNOSIS — G35 Multiple sclerosis: Secondary | ICD-10-CM | POA: Diagnosis not present

## 2016-06-05 DIAGNOSIS — M48061 Spinal stenosis, lumbar region without neurogenic claudication: Secondary | ICD-10-CM | POA: Diagnosis not present

## 2016-06-05 DIAGNOSIS — M4802 Spinal stenosis, cervical region: Secondary | ICD-10-CM | POA: Diagnosis not present

## 2016-06-05 DIAGNOSIS — L89153 Pressure ulcer of sacral region, stage 3: Secondary | ICD-10-CM | POA: Diagnosis not present

## 2016-06-05 DIAGNOSIS — L89612 Pressure ulcer of right heel, stage 2: Secondary | ICD-10-CM | POA: Diagnosis not present

## 2016-06-06 ENCOUNTER — Other Ambulatory Visit: Payer: Self-pay | Admitting: *Deleted

## 2016-06-06 ENCOUNTER — Telehealth: Payer: Self-pay | Admitting: Family Medicine

## 2016-06-06 NOTE — Patient Outreach (Signed)
[facility]) Care Management  06/06/2016  Barbara [name] 04-08-1956 [id number]   Final transition of care call   Spoke with patient reports she is out to bed in recliner chair , her neighbor assisted her on today. Patient discussed her blood sugar has been in the range of 150 -170, she is tolerating checking her blood sugar and administering insulin.  Patient report supplies have been delivered for foley catheter to be inserted by home health RN on tomorrow.   Patient discussed concern regarding her financial responsibilities, medical equipment rental, bill at SNF due to staying a week longer, home health services, and concern due to cost of Lantus insulin over $100 and that really hurts her monthly expenses.  Patient discussed she has been made aware that Sharlet Salina is outside  [facility] service area for  palliative care. Discussed with patient that there are maybe other  options for palliative care, and she is agreeable to looking into another agency, reminding her that they will again have to review her case and her location. Discussed one option of [facility] she is agreeable to explore this option.   Patient has completed 1st 30 days of transition of care program, will continue to follow for complex care management .  Plan Will in basket message to Judithe Modest, RN at Dr.Hensel office regarding possible option of [facility] for palliative care.  Will discuss patient cost concern for Lantus with [facility] Pharmacist, Moises Blood that has recently consulted with patient and follow up with patient in the next week.   Egbert Garibaldi, RN, [facility] Care Management [phone]- Mobile [phone]- Toll Free Main Office

## 2016-06-06 NOTE — Telephone Encounter (Signed)
Received a VM from [place] at [facility]. She states there was a referral faxed yesterday for patient. Unfortunately, this patient lives in [place] and is outside of [facility] limits. Unable to process there referral. Patient has been notified of this per Byrd Hesselbach.

## 2016-06-06 NOTE — Telephone Encounter (Signed)
Oh well.  Patient is aware.

## 2016-06-07 ENCOUNTER — Telehealth: Payer: Self-pay | Admitting: *Deleted

## 2016-06-07 ENCOUNTER — Other Ambulatory Visit: Payer: Self-pay | Admitting: Family Medicine

## 2016-06-07 DIAGNOSIS — L89612 Pressure ulcer of right heel, stage 2: Secondary | ICD-10-CM | POA: Diagnosis not present

## 2016-06-07 DIAGNOSIS — L89153 Pressure ulcer of sacral region, stage 3: Secondary | ICD-10-CM | POA: Diagnosis not present

## 2016-06-07 DIAGNOSIS — I10 Essential (primary) hypertension: Secondary | ICD-10-CM | POA: Diagnosis not present

## 2016-06-07 DIAGNOSIS — M48061 Spinal stenosis, lumbar region without neurogenic claudication: Secondary | ICD-10-CM | POA: Diagnosis not present

## 2016-06-07 DIAGNOSIS — M6281 Muscle weakness (generalized): Secondary | ICD-10-CM | POA: Diagnosis not present

## 2016-06-07 DIAGNOSIS — E119 Type 2 diabetes mellitus without complications: Secondary | ICD-10-CM | POA: Diagnosis not present

## 2016-06-07 DIAGNOSIS — G35 Multiple sclerosis: Secondary | ICD-10-CM | POA: Diagnosis not present

## 2016-06-07 DIAGNOSIS — M4802 Spinal stenosis, cervical region: Secondary | ICD-10-CM | POA: Diagnosis not present

## 2016-06-07 DIAGNOSIS — M75101 Unspecified rotator cuff tear or rupture of right shoulder, not specified as traumatic: Secondary | ICD-10-CM | POA: Diagnosis not present

## 2016-06-07 MED FILL — GABAPENTIN 300 MG CAPSULE: 300 | 90 days supply | Qty: 270 | Fill #0

## 2016-06-07 NOTE — Telephone Encounter (Signed)
Patient called back to say she is interested in this resource. Called referral to [place] Breeding at [facility] [phone]) Contact info and Insurance card faxed to [phone] L. Leward Quan, RN, BSN

## 2016-06-07 NOTE — Telephone Encounter (Signed)
Pt said yes to pallative care

## 2016-06-07 NOTE — Telephone Encounter (Signed)
Referral has been placed for [facility]all[facility]. Kinnie Feil, RN, BSN

## 2016-06-07 NOTE — Telephone Encounter (Signed)
-----   Message from Moses Manners, MD sent at 06/07/2016  9:18 AM EST ----- Regarding: RE: Resource for palliative care referral  I called and left message for patient.  I asked her to call back if she wanted [place] to put in a palliative care consult for [facility].  Thanks. ----- Message ----- From: Fredderick Severance, RN Sent: 06/06/2016   4:29 PM To: Moses Manners, MD Subject: Annell Greening: Resource for palliative care referral      Please see Kim's note below ----- Message ----- From: Georgette Dover, RN Sent: 06/06/2016   4:07 PM To: Fredderick Severance, RN Subject: Resource for palliative care referral          [facility], [facility] palliative care services ,home based palliative care program maybe another resource  option for Mrs.Shukla, she maybe just outside the service area but they will review her case,  To call in referral [phone], or fax to [phone].   For Questions call  Egbert Garibaldi, RN, [facility] Care Management [phone]- Mobile [phone]- Toll Free Main Office

## 2016-06-11 ENCOUNTER — Ambulatory Visit: Payer: Self-pay | Admitting: Diagnostic Neuroimaging

## 2016-06-12 DIAGNOSIS — I10 Essential (primary) hypertension: Secondary | ICD-10-CM | POA: Diagnosis not present

## 2016-06-12 DIAGNOSIS — M4802 Spinal stenosis, cervical region: Secondary | ICD-10-CM | POA: Diagnosis not present

## 2016-06-12 DIAGNOSIS — E119 Type 2 diabetes mellitus without complications: Secondary | ICD-10-CM | POA: Diagnosis not present

## 2016-06-12 DIAGNOSIS — L89612 Pressure ulcer of right heel, stage 2: Secondary | ICD-10-CM | POA: Diagnosis not present

## 2016-06-12 DIAGNOSIS — M6281 Muscle weakness (generalized): Secondary | ICD-10-CM | POA: Diagnosis not present

## 2016-06-12 DIAGNOSIS — M48061 Spinal stenosis, lumbar region without neurogenic claudication: Secondary | ICD-10-CM | POA: Diagnosis not present

## 2016-06-12 DIAGNOSIS — L89153 Pressure ulcer of sacral region, stage 3: Secondary | ICD-10-CM | POA: Diagnosis not present

## 2016-06-12 DIAGNOSIS — G35 Multiple sclerosis: Secondary | ICD-10-CM | POA: Diagnosis not present

## 2016-06-12 DIAGNOSIS — M75101 Unspecified rotator cuff tear or rupture of right shoulder, not specified as traumatic: Secondary | ICD-10-CM | POA: Diagnosis not present

## 2016-06-14 ENCOUNTER — Other Ambulatory Visit: Payer: Self-pay | Admitting: *Deleted

## 2016-06-14 NOTE — Patient Outreach (Signed)
[facility]) Care Management  06/14/2016  Barbara [name] June 09, 1955 [id number]   Transition of care, telephone follow up  Spoke with patient briefly, she then requested that she would  return my call later  Plan  Await return call, if no response will place on schedule to return call in the next 2 weeks.       Egbert Garibaldi, RN, [facility] Care Management [phone]- Mobile [phone]- Toll Free Main Office

## 2016-06-19 ENCOUNTER — Other Ambulatory Visit: Payer: Self-pay | Admitting: *Deleted

## 2016-06-19 NOTE — Patient Outreach (Signed)
[facility]) Care Management  06/19/2016  Barbara [name] 07/17/1955 [id number]   Follow up telephone assessment  Spoke with patient briefly, reports she is resting in her lift chair where she slept on last night. Patient discussed progress she had been making with physical therapy with trying to stand, but legs where weaker last 2 visits but she is looking forward to visit on today.  Patient reports adjusting to having foley catheter in place, no problems   Patient discussed that  she has not heard any follow up from [facility] consult  and wanted me to follow up.  Patient then states she has to hang up as therapy is arriving.  Plan Will place call to [facility] and will update patient in the next week.  Spoke with Drenda Freeze at [facility] states they are still in the process of reviewing case, and they will contact patient if they will be able to provide services. Plan next follow up call to patient within the week.    Egbert Garibaldi, RN, [facility] Care Management [phone]- Mobile [phone]- Toll Free Main Office

## 2016-06-24 DIAGNOSIS — M6281 Muscle weakness (generalized): Secondary | ICD-10-CM | POA: Diagnosis not present

## 2016-06-24 DIAGNOSIS — M4802 Spinal stenosis, cervical region: Secondary | ICD-10-CM | POA: Diagnosis not present

## 2016-06-24 DIAGNOSIS — E119 Type 2 diabetes mellitus without complications: Secondary | ICD-10-CM | POA: Diagnosis not present

## 2016-06-24 DIAGNOSIS — L89153 Pressure ulcer of sacral region, stage 3: Secondary | ICD-10-CM | POA: Diagnosis not present

## 2016-06-24 DIAGNOSIS — G35 Multiple sclerosis: Secondary | ICD-10-CM | POA: Diagnosis not present

## 2016-06-24 DIAGNOSIS — M48061 Spinal stenosis, lumbar region without neurogenic claudication: Secondary | ICD-10-CM | POA: Diagnosis not present

## 2016-06-24 DIAGNOSIS — I10 Essential (primary) hypertension: Secondary | ICD-10-CM | POA: Diagnosis not present

## 2016-06-24 DIAGNOSIS — L89612 Pressure ulcer of right heel, stage 2: Secondary | ICD-10-CM | POA: Diagnosis not present

## 2016-06-24 DIAGNOSIS — M75101 Unspecified rotator cuff tear or rupture of right shoulder, not specified as traumatic: Secondary | ICD-10-CM | POA: Diagnosis not present

## 2016-06-26 ENCOUNTER — Other Ambulatory Visit: Payer: Self-pay | Admitting: *Deleted

## 2016-06-26 MED FILL — OXYBUTYNIN CL ER 5 MG TAB: 5 | 90 days supply | Qty: 90 | Fill #2

## 2016-06-26 MED FILL — diazePAM 5 MG TABS: 5 | 22 days supply | Qty: 90 | Fill #1

## 2016-06-26 MED FILL — METHOCARBAMOL 750 MG TABLET: 750 | 90 days supply | Qty: 540 | Fill #2

## 2016-06-26 NOTE — Patient Outreach (Signed)
[facility]) Care Management  06/26/2016  Uchenna Gourd [birth year] [id number]   Follow telephone call  Placed call to patient for follow up, no answer able to leave a hipaa compliant telephone message requesting a return call.  Plan  Will await return response , if no response with attempt to contact patient in the next week.   Incoming call from Kiana at [facility] , [facility] related to referral, she explained that they will not be able to enroll her primarily based on location that she lives.   Will discuss with  patient on next contact , and notify Lauren at PCP office.   Egbert Garibaldi, RN, [facility] Care Management [phone]- Mobile [phone]- Toll Free Main Office

## 2016-07-01 DIAGNOSIS — G35 Multiple sclerosis: Secondary | ICD-10-CM | POA: Diagnosis not present

## 2016-07-02 DIAGNOSIS — M75101 Unspecified rotator cuff tear or rupture of right shoulder, not specified as traumatic: Secondary | ICD-10-CM | POA: Diagnosis not present

## 2016-07-02 DIAGNOSIS — Z7982 Long term (current) use of aspirin: Secondary | ICD-10-CM | POA: Diagnosis not present

## 2016-07-02 DIAGNOSIS — Z794 Long term (current) use of insulin: Secondary | ICD-10-CM | POA: Diagnosis not present

## 2016-07-02 DIAGNOSIS — M6281 Muscle weakness (generalized): Secondary | ICD-10-CM | POA: Diagnosis not present

## 2016-07-02 DIAGNOSIS — M48061 Spinal stenosis, lumbar region without neurogenic claudication: Secondary | ICD-10-CM | POA: Diagnosis not present

## 2016-07-02 DIAGNOSIS — E119 Type 2 diabetes mellitus without complications: Secondary | ICD-10-CM | POA: Diagnosis not present

## 2016-07-02 DIAGNOSIS — M4802 Spinal stenosis, cervical region: Secondary | ICD-10-CM | POA: Diagnosis not present

## 2016-07-02 DIAGNOSIS — G35 Multiple sclerosis: Secondary | ICD-10-CM | POA: Diagnosis not present

## 2016-07-02 DIAGNOSIS — I10 Essential (primary) hypertension: Secondary | ICD-10-CM | POA: Diagnosis not present

## 2016-07-03 ENCOUNTER — Other Ambulatory Visit: Payer: Self-pay | Admitting: *Deleted

## 2016-07-03 NOTE — Patient Outreach (Signed)
[facility]) Care Management  07/03/2016  Barbara [name] 09/08/1955 [id number]   Follow up call   Spoke with patient , she asked if had heard back from [facility] consult I discussed attempted call on last week when I received call back from agency, updated her that [facility] would not be able to follow up with her mainly due to location assured her that I had spoken with nurse at Dr.Hensel office last week with this update.  Discussed she is interested in palliative care not Hospice, patient states she is aware [facility] only does hospice care NOT palliative.   Barbara [name] discussed she is still followed by home health RN, physical therapy ended case on last week due to progress not being made per patient.   Discussed with patient her previous concern regarding cost of insulin and strips, discussed with her that I had spoken on last week with  Barbara [name] Pharmacist that she has spoken with in past regarding her concerns, and if patient agreeable pharmacist would be able to speak with her again. Barbara [name] is in agreement and verbalized cost of strips, "$122, and insulin "over a $100 " and she is unsure of if that is one month or 90 days.  Barbara [name] discussed she is still checking her blood sugar and taking her medications, patient has declined need for additional education on Diabetes or need for home visit .  Patient continues to have assistance from family and neighbors for getting out of bed with hoyer lift. Unable to discuss progress with wound healing at this visit.   Patient is agreeable to follow up call in the next 2 weeks,discussed I would continue to look into other palliative care  community resources for her.   Plan  Placed call to [facility] to discuss resources for palliative care in the area, representative discussed they are looking into palliative care program for this year but it has not started yet, but they would be willing to  met with patient for a information session only .  Returned call to patient to explain this, she does not want to speak with them unless the Palliative care program started, again stating she is not Hospice, reassured patient of understanding.   Placed call to Barbara [name], Pharmacist regarding cost concerns and patient agreeable to phone call.   Plan return call to patient in the next 2 weeks or sooner if new palliative option identified.   Barbara Glover, RN, PCCN [facility] Care Management [phone]- Mobile [phone]- Toll Free Main Office  

## 2016-07-04 ENCOUNTER — Other Ambulatory Visit: Payer: Self-pay | Admitting: Family Medicine

## 2016-07-04 DIAGNOSIS — G35 Multiple sclerosis: Secondary | ICD-10-CM | POA: Diagnosis not present

## 2016-07-04 DIAGNOSIS — M75101 Unspecified rotator cuff tear or rupture of right shoulder, not specified as traumatic: Secondary | ICD-10-CM | POA: Diagnosis not present

## 2016-07-04 DIAGNOSIS — M4802 Spinal stenosis, cervical region: Secondary | ICD-10-CM | POA: Diagnosis not present

## 2016-07-04 DIAGNOSIS — E119 Type 2 diabetes mellitus without complications: Secondary | ICD-10-CM | POA: Diagnosis not present

## 2016-07-04 DIAGNOSIS — L03119 Cellulitis of unspecified part of limb: Principal | ICD-10-CM

## 2016-07-04 DIAGNOSIS — Z7982 Long term (current) use of aspirin: Secondary | ICD-10-CM | POA: Diagnosis not present

## 2016-07-04 DIAGNOSIS — M48061 Spinal stenosis, lumbar region without neurogenic claudication: Secondary | ICD-10-CM | POA: Diagnosis not present

## 2016-07-04 DIAGNOSIS — I10 Essential (primary) hypertension: Secondary | ICD-10-CM | POA: Diagnosis not present

## 2016-07-04 DIAGNOSIS — M6281 Muscle weakness (generalized): Secondary | ICD-10-CM | POA: Diagnosis not present

## 2016-07-04 DIAGNOSIS — Z794 Long term (current) use of insulin: Secondary | ICD-10-CM | POA: Diagnosis not present

## 2016-07-04 DIAGNOSIS — L02419 Cutaneous abscess of limb, unspecified: Secondary | ICD-10-CM

## 2016-07-04 MED FILL — DOXYCYCLINE HYCLATE 100 MG: 100 | 10 days supply | Qty: 20 | Fill #0

## 2016-07-05 ENCOUNTER — Other Ambulatory Visit: Payer: Self-pay | Admitting: Pharmacist

## 2016-07-05 DIAGNOSIS — M48061 Spinal stenosis, lumbar region without neurogenic claudication: Secondary | ICD-10-CM | POA: Diagnosis not present

## 2016-07-05 DIAGNOSIS — Z794 Long term (current) use of insulin: Secondary | ICD-10-CM | POA: Diagnosis not present

## 2016-07-05 DIAGNOSIS — M75101 Unspecified rotator cuff tear or rupture of right shoulder, not specified as traumatic: Secondary | ICD-10-CM | POA: Diagnosis not present

## 2016-07-05 DIAGNOSIS — M4802 Spinal stenosis, cervical region: Secondary | ICD-10-CM | POA: Diagnosis not present

## 2016-07-05 DIAGNOSIS — Z7982 Long term (current) use of aspirin: Secondary | ICD-10-CM | POA: Diagnosis not present

## 2016-07-05 DIAGNOSIS — E119 Type 2 diabetes mellitus without complications: Secondary | ICD-10-CM | POA: Diagnosis not present

## 2016-07-05 DIAGNOSIS — I10 Essential (primary) hypertension: Secondary | ICD-10-CM | POA: Diagnosis not present

## 2016-07-05 DIAGNOSIS — G35 Multiple sclerosis: Secondary | ICD-10-CM | POA: Diagnosis not present

## 2016-07-05 DIAGNOSIS — M6281 Muscle weakness (generalized): Secondary | ICD-10-CM | POA: Diagnosis not present

## 2016-07-05 NOTE — Patient Outreach (Signed)
Receive coordination of care call from [place] requesting that I reach out to patient. RNCM reports that patient has questions regarding the cost of her insulin and test strips. Called and spoke with patient. HIPAA identifiers verified and verbal consent received.  Barbara [name] reports that her insulins cost her over $100. Review patient's formulary through the [employer] website and note that patient's Lantus and Novolog are both tier 3 options through her insurance. Let patient know that based on her plan, they should each cost $45 for a 30 day supply or $90 for a 90 day supply. Barbara [name] reports that this is consistent with what she paid for the medications, but that this is unaffordable to her. Patient asks about patient assistance options. Review patient assistance programs for each medication with her. Note that for [employer] patient assistance program, which assists with cost of Novolog and Levemir, patient reports meeting the income requirement and reports that she has either reached or is close to reaching the out of pocket expenditure requirement. Note that this program requires the patient to have applied for extra help. Complete the extra help application with Barbara [name] now. Receive permission from patient to submit the application based on the answers that she provided.  Offer to reach out to patient's PCP regarding whether he would be willing to switch the patient from Lantus to Levemir in order for her to apply for assistance for Levemir through [employer] program. Patient asks that I contact her PCP to do this.  Patient reports that she will check with her pharmacy to see if she has met the out of pocket expense requirement at this time and request documentation if she has. Reports that she will give me a call as soon as she receives a response back regarding her extra help application in the mail. Let patient know that she should expect to receive  this in 2-4 weeks and remind her to retain this letter to be used for her assistance application.  Barbara [name] reports that she also recently had an issue with a vial of test strips that she picked up from her pharmacy. Reports that the test strips in one of the two vials in her last box were faulty, producing error results with almost every strip. Advise patient to call the phone number for the manufacturer and let them know and request a new vial. Patient reports that she has the manufacturer phone number and will make this call.  Barbara [name] reports that she has no further medication questions/concerns at this time. Patient confirms that she still has my phone number.   PLAN:  1) Patient to follow up with pharmacy for out of pocket expense documentation 2) Patient to call once she receives response in mail from social security about extra help application 3) I will send a message to patient's PCP, Dr. Andria [name], asking if he will be Okay with prescribing Levemir for the patient, in place of Lantus, in order to allow her to receive her long-acting insulin through patient assistance. 4) If have not heard back from Barbara [name] in 4 weeks, will follow up with her again at that time. 5) Patient to follow up with test strip manufacturer to request a new vial be sent to her.  Harlow Asa, PharmD, [place] [facility] [phone]

## 2016-07-10 ENCOUNTER — Encounter: Payer: Self-pay | Admitting: Family Medicine

## 2016-07-10 ENCOUNTER — Telehealth: Payer: Self-pay | Admitting: Family Medicine

## 2016-07-10 MED ORDER — INSULIN DETEMIR 100 UNIT/ML FLEXPEN: 20.0000 [IU] | PEN_INJECTOR | Freq: Every day | SUBCUTANEOUS | 11 refills | Status: DC

## 2016-07-10 NOTE — Telephone Encounter (Signed)
Received fax from [facility], [place], PharmD asking: #1 Switch patient from Lantus to levemir flex touch - changed in med list. #2 Complete form - although I believe I was sent the wrong form and will fax back. #3 Create letter saying needs pen device.  Done

## 2016-07-12 ENCOUNTER — Other Ambulatory Visit: Payer: Self-pay | Admitting: Pharmacist

## 2016-07-12 NOTE — Patient Outreach (Signed)
Receive provider portion of [employer] patient assistance application for Novolog and Levemir back from patient's PCP, [name].   Call to follow up with Barbara [name]. Called and spoke with patient. HIPAA identifiers verified and verbal consent received.  Let [name] know that her PCP was Okay with switching her from Lantus to Levemir in order for her to apply for assistance for Levemir through [employer] and that he has completed the provider portion of the application. [name] lets me know that she called her pharmacy and that thus far her total out-of-pocket expense for the current calendar year is about $500. [name] reports that she will call me back as soon as she has the response letter from social security about her extra help application.  [name] denies any further medication questions/concerns at this time. If have not heard back from [name] in 3 weeks, will follow up with her again at that time.  [name], PharmD, [facility] Clinical Pharmacist [facility] Management [phone]

## 2016-07-14 ENCOUNTER — Telehealth: Payer: Self-pay | Admitting: Family Medicine

## 2016-07-14 DIAGNOSIS — Z794 Long term (current) use of insulin: Secondary | ICD-10-CM | POA: Diagnosis not present

## 2016-07-14 DIAGNOSIS — E119 Type 2 diabetes mellitus without complications: Secondary | ICD-10-CM | POA: Diagnosis not present

## 2016-07-14 DIAGNOSIS — G35 Multiple sclerosis: Secondary | ICD-10-CM | POA: Diagnosis not present

## 2016-07-14 DIAGNOSIS — M4802 Spinal stenosis, cervical region: Secondary | ICD-10-CM | POA: Diagnosis not present

## 2016-07-14 DIAGNOSIS — M6281 Muscle weakness (generalized): Secondary | ICD-10-CM | POA: Diagnosis not present

## 2016-07-14 DIAGNOSIS — M75101 Unspecified rotator cuff tear or rupture of right shoulder, not specified as traumatic: Secondary | ICD-10-CM | POA: Diagnosis not present

## 2016-07-14 DIAGNOSIS — I10 Essential (primary) hypertension: Secondary | ICD-10-CM | POA: Diagnosis not present

## 2016-07-14 DIAGNOSIS — Z7982 Long term (current) use of aspirin: Secondary | ICD-10-CM | POA: Diagnosis not present

## 2016-07-14 DIAGNOSIS — M48061 Spinal stenosis, lumbar region without neurogenic claudication: Secondary | ICD-10-CM | POA: Diagnosis not present

## 2016-07-14 NOTE — Telephone Encounter (Signed)
**  After Hours/ Emergency Line Call*  Received a call from patient's home care nurse Arline Asp to report that [name] thinks that her catheter is leaking and may have fallen out. She notes that she doesn't have any catheter orders in place. Asking for catheter orders. Verbal orders for catheter placed.  Will forward to PCP.  Beaulah Dinning, MD PGY-2, [facility]

## 2016-07-15 ENCOUNTER — Telehealth: Payer: Self-pay | Admitting: Internal Medicine

## 2016-07-15 NOTE — Telephone Encounter (Signed)
After Hours Emergency Line Call:  Received page from Ms. Phommachanh's Homehealth RN [employer]. Arline Asp reports that she was asked to come out to patient's house for leaking urinary catheter. Catheter did not appear to be leaking but was not flushing and patient had appeared to be vaginal candidiasis around the catheter site. Catheter was removed and there was some frank blood. Catheter was replaced and no blood was present in the catheter when flushed so unsure whether bleeding was related to traumatic removal of cath or vaginal bleeding. Discussed with [facility] RN who believes patient to be very stable otherwise and not in need of emergent evaluation. Have scheduled SDA with Dr. Doroteo Glassman tomorrow, 3/13, at 3:15. RN confirmed with patient/family that appointment worked for their schedule. Offered to prescribe Diflucan over the phone but patient declined. Discussed that if bleeding persisted in a heavy fashion or if patient became lightheaded, dizzy, SOB, or developed chest pain that she should be seen sooner than her appointment time. Will forward to Dr. Doroteo Glassman and PCP.   Marcy Siren, D.O. 07/15/2016, 1:12 PM PGY-2, [place]

## 2016-07-16 ENCOUNTER — Emergency Department (HOSPITAL_COMMUNITY): Payer: PPO

## 2016-07-16 ENCOUNTER — Ambulatory Visit: Payer: PPO | Admitting: Obstetrics and Gynecology

## 2016-07-16 ENCOUNTER — Encounter (HOSPITAL_COMMUNITY): Payer: Self-pay | Admitting: *Deleted

## 2016-07-16 ENCOUNTER — Inpatient Hospital Stay (HOSPITAL_COMMUNITY)
Admission: EM | Admit: 2016-07-16 | Discharge: 2016-07-22 | DRG: 871 | Disposition: A | Discharge status: Home Health | Payer: PPO | Attending: Family Medicine | Admitting: Family Medicine

## 2016-07-16 DIAGNOSIS — I959 Hypotension, unspecified: Secondary | ICD-10-CM | POA: Diagnosis not present

## 2016-07-16 DIAGNOSIS — B961 Klebsiella pneumoniae [K. pneumoniae] as the cause of diseases classified elsewhere: Secondary | ICD-10-CM | POA: Diagnosis present

## 2016-07-16 DIAGNOSIS — L03116 Cellulitis of left lower limb: Secondary | ICD-10-CM

## 2016-07-16 DIAGNOSIS — R531 Weakness: Secondary | ICD-10-CM | POA: Diagnosis not present

## 2016-07-16 DIAGNOSIS — G4733 Obstructive sleep apnea (adult) (pediatric): Secondary | ICD-10-CM | POA: Diagnosis not present

## 2016-07-16 DIAGNOSIS — N319 Neuromuscular dysfunction of bladder, unspecified: Secondary | ICD-10-CM | POA: Diagnosis not present

## 2016-07-16 DIAGNOSIS — Z7982 Long term (current) use of aspirin: Secondary | ICD-10-CM

## 2016-07-16 DIAGNOSIS — M109 Gout, unspecified: Secondary | ICD-10-CM | POA: Diagnosis not present

## 2016-07-16 DIAGNOSIS — I872 Venous insufficiency (chronic) (peripheral): Secondary | ICD-10-CM | POA: Diagnosis not present

## 2016-07-16 DIAGNOSIS — R0789 Other chest pain: Secondary | ICD-10-CM | POA: Diagnosis not present

## 2016-07-16 DIAGNOSIS — L03115 Cellulitis of right lower limb: Secondary | ICD-10-CM | POA: Diagnosis not present

## 2016-07-16 DIAGNOSIS — Z7952 Long term (current) use of systemic steroids: Secondary | ICD-10-CM

## 2016-07-16 DIAGNOSIS — R6521 Severe sepsis with septic shock: Secondary | ICD-10-CM | POA: Diagnosis present

## 2016-07-16 DIAGNOSIS — N3000 Acute cystitis without hematuria: Secondary | ICD-10-CM

## 2016-07-16 DIAGNOSIS — M4802 Spinal stenosis, cervical region: Secondary | ICD-10-CM | POA: Diagnosis not present

## 2016-07-16 DIAGNOSIS — R0602 Shortness of breath: Secondary | ICD-10-CM

## 2016-07-16 DIAGNOSIS — N390 Urinary tract infection, site not specified: Secondary | ICD-10-CM | POA: Diagnosis present

## 2016-07-16 DIAGNOSIS — I4581 Long QT syndrome: Secondary | ICD-10-CM | POA: Diagnosis not present

## 2016-07-16 DIAGNOSIS — J90 Pleural effusion, not elsewhere classified: Secondary | ICD-10-CM | POA: Diagnosis not present

## 2016-07-16 DIAGNOSIS — L03119 Cellulitis of unspecified part of limb: Secondary | ICD-10-CM

## 2016-07-16 DIAGNOSIS — G8220 Paraplegia, unspecified: Secondary | ICD-10-CM | POA: Diagnosis not present

## 2016-07-16 DIAGNOSIS — IMO0001 Reserved for inherently not codable concepts without codable children: Secondary | ICD-10-CM

## 2016-07-16 DIAGNOSIS — L0390 Cellulitis, unspecified: Secondary | ICD-10-CM

## 2016-07-16 DIAGNOSIS — J811 Chronic pulmonary edema: Secondary | ICD-10-CM | POA: Diagnosis present

## 2016-07-16 DIAGNOSIS — I878 Other specified disorders of veins: Secondary | ICD-10-CM | POA: Diagnosis present

## 2016-07-16 DIAGNOSIS — R0902 Hypoxemia: Secondary | ICD-10-CM | POA: Diagnosis not present

## 2016-07-16 DIAGNOSIS — E2740 Unspecified adrenocortical insufficiency: Secondary | ICD-10-CM | POA: Diagnosis not present

## 2016-07-16 DIAGNOSIS — R000 Tachycardia, unspecified: Secondary | ICD-10-CM | POA: Diagnosis not present

## 2016-07-16 DIAGNOSIS — G35 Multiple sclerosis: Secondary | ICD-10-CM | POA: Diagnosis not present

## 2016-07-16 DIAGNOSIS — Z6841 Body Mass Index (BMI) 40.0 and over, adult: Secondary | ICD-10-CM | POA: Diagnosis not present

## 2016-07-16 DIAGNOSIS — R079 Chest pain, unspecified: Secondary | ICD-10-CM | POA: Diagnosis not present

## 2016-07-16 DIAGNOSIS — IMO0002 Reserved for concepts with insufficient information to code with codable children: Secondary | ICD-10-CM | POA: Diagnosis present

## 2016-07-16 DIAGNOSIS — L89151 Pressure ulcer of sacral region, stage 1: Secondary | ICD-10-CM | POA: Diagnosis present

## 2016-07-16 DIAGNOSIS — E872 Acidosis: Secondary | ICD-10-CM | POA: Diagnosis not present

## 2016-07-16 DIAGNOSIS — L89152 Pressure ulcer of sacral region, stage 2: Secondary | ICD-10-CM | POA: Diagnosis not present

## 2016-07-16 DIAGNOSIS — A419 Sepsis, unspecified organism: Principal | ICD-10-CM | POA: Diagnosis present

## 2016-07-16 DIAGNOSIS — Z66 Do not resuscitate: Secondary | ICD-10-CM | POA: Diagnosis not present

## 2016-07-16 DIAGNOSIS — Z79899 Other long term (current) drug therapy: Secondary | ICD-10-CM

## 2016-07-16 DIAGNOSIS — I10 Essential (primary) hypertension: Secondary | ICD-10-CM | POA: Diagnosis not present

## 2016-07-16 DIAGNOSIS — M6281 Muscle weakness (generalized): Secondary | ICD-10-CM | POA: Diagnosis not present

## 2016-07-16 DIAGNOSIS — E669 Obesity, unspecified: Secondary | ICD-10-CM | POA: Diagnosis not present

## 2016-07-16 DIAGNOSIS — E119 Type 2 diabetes mellitus without complications: Secondary | ICD-10-CM | POA: Diagnosis not present

## 2016-07-16 DIAGNOSIS — L02419 Cutaneous abscess of limb, unspecified: Secondary | ICD-10-CM | POA: Diagnosis not present

## 2016-07-16 DIAGNOSIS — G35D Multiple sclerosis, unspecified: Secondary | ICD-10-CM | POA: Diagnosis present

## 2016-07-16 DIAGNOSIS — R0600 Dyspnea, unspecified: Secondary | ICD-10-CM | POA: Diagnosis not present

## 2016-07-16 DIAGNOSIS — I5032 Chronic diastolic (congestive) heart failure: Secondary | ICD-10-CM | POA: Diagnosis not present

## 2016-07-16 DIAGNOSIS — Z794 Long term (current) use of insulin: Secondary | ICD-10-CM

## 2016-07-16 DIAGNOSIS — Z888 Allergy status to other drugs, medicaments and biological substances status: Secondary | ICD-10-CM

## 2016-07-16 DIAGNOSIS — R109 Unspecified abdominal pain: Secondary | ICD-10-CM | POA: Diagnosis not present

## 2016-07-16 HISTORY — DX: Type 2 diabetes mellitus without complications: E11.9

## 2016-07-16 HISTORY — DX: Essential (primary) hypertension: I10

## 2016-07-16 LAB — CREATININE, SERUM
CREATININE: 0.93 mg/dL (ref 0.44–1.00)
GFR calc Af Amer: >60 mL/min (ref >60)

## 2016-07-16 LAB — GLUCOSE, CAPILLARY
GLUCOSE-CAPILLARY: 216 mg/dL — AB (ref 65–99)
GLUCOSE-CAPILLARY: 218 mg/dL — AB (ref 65–99)
Glucose-Capillary: 156 mg/dL — ABNORMAL HIGH (ref 65–99)

## 2016-07-16 LAB — CBC WITH DIFFERENTIAL/PLATELET
Basophils Absolute: 0 10*3/uL (ref 0.0–0.1)
Basophils Relative: 0 %
EOS PCT: 1 %
Eosinophils Absolute: 0.2 10*3/uL (ref 0.0–0.7)
HEMATOCRIT: 39.2 % (ref 36.0–46.0)
Hemoglobin: 12.5 g/dL (ref 12.0–15.0)
LYMPHS ABS: 1.7 10*3/uL (ref 0.7–4.0)
LYMPHS PCT: 10 %
MCH: 29.7 pg (ref 26.0–34.0)
MCHC: 31.9 g/dL (ref 30.0–36.0)
MCV: 93.1 fL (ref 78.0–100.0)
MONO ABS: 0.7 10*3/uL (ref 0.1–1.0)
MONOS PCT: 4 %
NEUTROS ABS: 15 10*3/uL — AB (ref 1.7–7.7)
Neutrophils Relative %: 85 %
PLATELETS: 207 10*3/uL (ref 150–400)
RBC: 4.21 MIL/uL (ref 3.87–5.11)
RDW: 18.1 % — AB (ref 11.5–15.5)
WBC: 17.6 10*3/uL — ABNORMAL HIGH (ref 4.0–10.5)

## 2016-07-16 LAB — URINALYSIS, ROUTINE W REFLEX MICROSCOPIC
BILIRUBIN URINE: NEGATIVE
Glucose, UA: NEGATIVE mg/dL
Ketones, ur: NEGATIVE mg/dL
NITRITE: NEGATIVE
Protein, ur: NEGATIVE mg/dL
pH: 5 (ref 5.0–8.0)

## 2016-07-16 LAB — LACTIC ACID, PLASMA: LACTIC ACID, VENOUS: 1.3 mmol/L (ref 0.5–1.9)

## 2016-07-16 LAB — COMPREHENSIVE METABOLIC PANEL
ALBUMIN: 3 g/dL — AB (ref 3.5–5.0)
ALT: 24 U/L (ref 14–54)
ANION GAP: 12 (ref 5–15)
AST: 36 U/L (ref 15–41)
Alkaline Phosphatase: 79 U/L (ref 38–126)
BILIRUBIN TOTAL: 0.7 mg/dL (ref 0.3–1.2)
BUN: 27 mg/dL — AB (ref 6–20)
CO2: 31 mmol/L (ref 22–32)
Calcium: 9.2 mg/dL (ref 8.9–10.3)
Chloride: 96 mmol/L — ABNORMAL LOW (ref 101–111)
Creatinine, Ser: 0.98 mg/dL (ref 0.44–1.00)
GFR calc Af Amer: >60 mL/min (ref >60)
GFR calc non Af Amer: >60 mL/min (ref >60)
GLUCOSE: 226 mg/dL — AB (ref 65–99)
POTASSIUM: 3.8 mmol/L (ref 3.5–5.1)
SODIUM: 139 mmol/L (ref 135–145)
TOTAL PROTEIN: 5.8 g/dL — AB (ref 6.5–8.1)

## 2016-07-16 LAB — CBC
HCT: 36.3 % (ref 36.0–46.0)
HEMOGLOBIN: 11.4 g/dL — AB (ref 12.0–15.0)
MCH: 29.1 pg (ref 26.0–34.0)
MCHC: 31.4 g/dL (ref 30.0–36.0)
MCV: 92.6 fL (ref 78.0–100.0)
Platelets: 180 10*3/uL (ref 150–400)
RBC: 3.92 MIL/uL (ref 3.87–5.11)
RDW: 18.2 % — ABNORMAL HIGH (ref 11.5–15.5)
WBC: 11.2 10*3/uL — ABNORMAL HIGH (ref 4.0–10.5)

## 2016-07-16 LAB — I-STAT CHEM 8, ED
BUN: 30 mg/dL — AB (ref 6–20)
CHLORIDE: 96 mmol/L — AB (ref 101–111)
CREATININE: 1 mg/dL (ref 0.44–1.00)
Calcium, Ion: 1.13 mmol/L — ABNORMAL LOW (ref 1.15–1.40)
GLUCOSE: 233 mg/dL — AB (ref 65–99)
HEMATOCRIT: 36 % (ref 36.0–46.0)
Hemoglobin: 12.2 g/dL (ref 12.0–15.0)
POTASSIUM: 4 mmol/L (ref 3.5–5.1)
Sodium: 139 mmol/L (ref 135–145)
TCO2: 34 mmol/L (ref 0–100)

## 2016-07-16 LAB — I-STAT TROPONIN, ED: TROPONIN I, POC: 0 ng/mL (ref 0.00–0.08)

## 2016-07-16 LAB — I-STAT CG4 LACTIC ACID, ED
Lactic Acid, Venous: 3.41 mmol/L (ref 0.5–1.9)
Lactic Acid, Venous: 1.95 mmol/L (ref 0.5–1.9)

## 2016-07-16 LAB — MRSA PCR SCREENING: MRSA BY PCR: NEGATIVE

## 2016-07-16 LAB — TROPONIN I: TROPONIN I: 0.04 ng/mL — AB (ref <0.03)

## 2016-07-16 LAB — TSH: TSH: 2.213 u[IU]/mL (ref 0.350–4.500)

## 2016-07-16 LAB — POC OCCULT BLOOD, ED: Fecal Occult Bld: NEGATIVE

## 2016-07-16 LAB — LIPASE, BLOOD: Lipase: 15 U/L (ref 11–51)

## 2016-07-16 MED ORDER — SODIUM CHLORIDE 0.9 % IV BOLUS (SEPSIS)
1000.0000 mL | Freq: Once | INTRAVENOUS | Status: AC
  Administered 2016-07-16: 1000 mL via INTRAVENOUS

## 2016-07-16 MED ORDER — HYDROCORTISONE NA SUCCINATE PF 100 MG IJ SOLR
100.0000 mg | Freq: Two times a day (BID) | INTRAMUSCULAR | Status: DC
  Administered 2016-07-16 – 2016-07-17 (×2): 100 mg via INTRAVENOUS
  Filled 2016-07-16 (×2): qty 2

## 2016-07-16 MED ORDER — ACETAMINOPHEN 325 MG PO TABS: 650.0000 mg | ORAL_TABLET | Freq: Four times a day (QID) | ORAL | Status: DC | PRN

## 2016-07-16 MED ORDER — ACETAMINOPHEN 650 MG RE SUPP: 650.0000 mg | Freq: Four times a day (QID) | RECTAL | Status: DC | PRN

## 2016-07-16 MED ORDER — PIPERACILLIN-TAZOBACTAM 3.375 G IVPB
3.3750 g | Freq: Three times a day (TID) | INTRAVENOUS | Status: DC
  Administered 2016-07-16 – 2016-07-18 (×6): 3.375 g via INTRAVENOUS
  Filled 2016-07-16 (×6): qty 50

## 2016-07-16 MED ORDER — VANCOMYCIN HCL IN DEXTROSE 1-5 GM/200ML-% IV SOLN
1000.0000 mg | Freq: Two times a day (BID) | INTRAVENOUS | Status: DC
  Administered 2016-07-16 – 2016-07-18 (×4): 1000 mg via INTRAVENOUS
  Filled 2016-07-16 (×4): qty 200

## 2016-07-16 MED ORDER — CALCIUM CARBONATE-VITAMIN D 500-200 MG-UNIT PO TABS
1.0000 | ORAL_TABLET | Freq: Every day | ORAL | Status: DC
  Administered 2016-07-16 – 2016-07-22 (×7): 1 via ORAL
  Filled 2016-07-16 (×7): qty 1

## 2016-07-16 MED ORDER — INSULIN DETEMIR 100 UNIT/ML ~~LOC~~ SOLN
10.0000 [IU] | Freq: Every day | SUBCUTANEOUS | Status: DC
  Administered 2016-07-16 – 2016-07-21 (×6): 10 [IU] via SUBCUTANEOUS
  Filled 2016-07-16 (×7): qty 0.1

## 2016-07-16 MED ORDER — INSULIN ASPART 100 UNIT/ML ~~LOC~~ SOLN
0.0000 [IU] | Freq: Every day | SUBCUTANEOUS | Status: DC
  Administered 2016-07-16 – 2016-07-21 (×3): 2 [IU] via SUBCUTANEOUS

## 2016-07-16 MED ORDER — ENOXAPARIN SODIUM 80 MG/0.8ML ~~LOC~~ SOLN: 75.0000 mg | SUBCUTANEOUS | Status: DC

## 2016-07-16 MED ORDER — GABAPENTIN 300 MG PO CAPS
300.0000 mg | ORAL_CAPSULE | Freq: Three times a day (TID) | ORAL | Status: DC
  Administered 2016-07-16 – 2016-07-22 (×19): 300 mg via ORAL
  Filled 2016-07-16 (×19): qty 1

## 2016-07-16 MED ORDER — DOCUSATE SODIUM 100 MG PO CAPS
100.0000 mg | ORAL_CAPSULE | Freq: Two times a day (BID) | ORAL | Status: DC
  Administered 2016-07-16 – 2016-07-22 (×13): 100 mg via ORAL
  Filled 2016-07-16 (×12): qty 1

## 2016-07-16 MED ORDER — MAGNESIUM OXIDE 400 (241.3 MG) MG PO TABS
400.0000 mg | ORAL_TABLET | Freq: Every day | ORAL | Status: DC
  Administered 2016-07-16 – 2016-07-22 (×7): 400 mg via ORAL
  Filled 2016-07-16 (×7): qty 1

## 2016-07-16 MED ORDER — CO Q 10 10 MG PO CAPS: 1.0000 | ORAL_CAPSULE | Freq: Every day | ORAL | Status: DC

## 2016-07-16 MED ORDER — SODIUM CHLORIDE 0.9% FLUSH
3.0000 mL | Freq: Two times a day (BID) | INTRAVENOUS | Status: DC
  Administered 2016-07-16 – 2016-07-22 (×10): 3 mL via INTRAVENOUS

## 2016-07-16 MED ORDER — ACETAMINOPHEN 500 MG PO TABS
1000.0000 mg | ORAL_TABLET | Freq: Once | ORAL | Status: AC
  Administered 2016-07-16: 1000 mg via ORAL
  Filled 2016-07-16: qty 2

## 2016-07-16 MED ORDER — POLYETHYLENE GLYCOL 3350 17 G PO PACK
17.0000 g | PACK | Freq: Every day | ORAL | Status: DC
  Administered 2016-07-16 – 2016-07-22 (×7): 17 g via ORAL
  Filled 2016-07-16 (×7): qty 1

## 2016-07-16 MED ORDER — DICYCLOMINE HCL 20 MG PO TABS: 20.0000 mg | ORAL_TABLET | Freq: Three times a day (TID) | ORAL | Status: DC | PRN

## 2016-07-16 MED ORDER — TRAMADOL HCL 50 MG PO TABS
50.0000 mg | ORAL_TABLET | Freq: Four times a day (QID) | ORAL | Status: DC
  Administered 2016-07-16 – 2016-07-22 (×26): 50 mg via ORAL
  Filled 2016-07-16 (×28): qty 1

## 2016-07-16 MED ORDER — VANCOMYCIN HCL IN DEXTROSE 1-5 GM/200ML-% IV SOLN: 1000.0000 mg | Freq: Once | INTRAVENOUS | Status: DC

## 2016-07-16 MED ORDER — CYANOCOBALAMIN 250 MCG PO TABS
250.0000 ug | ORAL_TABLET | Freq: Every day | ORAL | Status: DC
  Administered 2016-07-16 – 2016-07-17 (×2): 250 ug via ORAL
  Filled 2016-07-16 (×3): qty 1

## 2016-07-16 MED ORDER — ONDANSETRON HCL 4 MG/2ML IJ SOLN: 4.0000 mg | Freq: Four times a day (QID) | INTRAMUSCULAR | Status: DC | PRN

## 2016-07-16 MED ORDER — ENOXAPARIN SODIUM 40 MG/0.4ML ~~LOC~~ SOLN
40.0000 mg | SUBCUTANEOUS | Status: DC
  Administered 2016-07-16: 40 mg via SUBCUTANEOUS
  Filled 2016-07-16: qty 0.4

## 2016-07-16 MED ORDER — INSULIN ASPART 100 UNIT/ML ~~LOC~~ SOLN
0.0000 [IU] | Freq: Three times a day (TID) | SUBCUTANEOUS | Status: DC
  Administered 2016-07-16: 5 [IU] via SUBCUTANEOUS
  Administered 2016-07-17: 2 [IU] via SUBCUTANEOUS
  Administered 2016-07-17: 3 [IU] via SUBCUTANEOUS
  Administered 2016-07-18: 5 [IU] via SUBCUTANEOUS
  Administered 2016-07-18: 3 [IU] via SUBCUTANEOUS
  Administered 2016-07-18: 2 [IU] via SUBCUTANEOUS
  Administered 2016-07-19: 5 [IU] via SUBCUTANEOUS
  Administered 2016-07-19: 3 [IU] via SUBCUTANEOUS
  Administered 2016-07-20: 8 [IU] via SUBCUTANEOUS
  Administered 2016-07-20: 3 [IU] via SUBCUTANEOUS
  Administered 2016-07-21: 2 [IU] via SUBCUTANEOUS
  Administered 2016-07-21: 5 [IU] via SUBCUTANEOUS
  Administered 2016-07-22 (×2): 2 [IU] via SUBCUTANEOUS

## 2016-07-16 MED ORDER — ADULT MULTIVITAMIN W/MINERALS CH
1.0000 | ORAL_TABLET | Freq: Every day | ORAL | Status: DC
  Administered 2016-07-16 – 2016-07-22 (×7): 1 via ORAL
  Filled 2016-07-16 (×6): qty 1

## 2016-07-16 MED ORDER — SODIUM CHLORIDE 0.9 % IV SOLN
INTRAVENOUS | Status: DC
  Administered 2016-07-16 – 2016-07-19 (×6): via INTRAVENOUS

## 2016-07-16 MED ORDER — METOCLOPRAMIDE HCL 10 MG PO TABS: 10.0000 mg | ORAL_TABLET | Freq: Four times a day (QID) | ORAL | Status: DC | PRN

## 2016-07-16 MED ORDER — VANCOMYCIN HCL 10 G IV SOLR
2500.0000 mg | Freq: Once | INTRAVENOUS | Status: AC
  Administered 2016-07-16: 2500 mg via INTRAVENOUS
  Filled 2016-07-16: qty 2500

## 2016-07-16 MED ORDER — PIPERACILLIN-TAZOBACTAM 3.375 G IVPB 30 MIN
3.3750 g | Freq: Once | INTRAVENOUS | Status: AC
  Administered 2016-07-16: 3.375 g via INTRAVENOUS
  Filled 2016-07-16: qty 50

## 2016-07-16 MED ORDER — ASPIRIN EC 325 MG PO TBEC
325.0000 mg | DELAYED_RELEASE_TABLET | Freq: Every day | ORAL | Status: DC
  Administered 2016-07-16 – 2016-07-22 (×7): 325 mg via ORAL
  Filled 2016-07-16 (×7): qty 1

## 2016-07-16 MED ORDER — MUSCLE RUB 10-15 % EX CREA
1.0000 | TOPICAL_CREAM | CUTANEOUS | Status: DC | PRN
Start: 2016-07-17 — End: 2016-07-23
  Administered 2016-07-19: 1 via TOPICAL
  Filled 2016-07-16: qty 85

## 2016-07-16 MED ORDER — ONDANSETRON HCL 4 MG PO TABS: 4.0000 mg | ORAL_TABLET | Freq: Four times a day (QID) | ORAL | Status: DC | PRN

## 2016-07-16 MED ORDER — IOPAMIDOL (ISOVUE-370) INJECTION 76%
INTRAVENOUS | Status: AC
  Administered 2016-07-16: 100 mL
  Filled 2016-07-16: qty 100

## 2016-07-16 MED ORDER — METHOCARBAMOL 500 MG PO TABS
1500.0000 mg | ORAL_TABLET | Freq: Three times a day (TID) | ORAL | Status: DC
  Administered 2016-07-16 – 2016-07-22 (×19): 1500 mg via ORAL
  Filled 2016-07-16 (×19): qty 3

## 2016-07-16 NOTE — ED Notes (Signed)
Patient's O2 sat in low 80's while pt asleep, placed on 4L O2 via Cool, O2 sat up to 96%.

## 2016-07-16 NOTE — ED Notes (Signed)
Patient transported to CT 

## 2016-07-16 NOTE — ED Notes (Signed)
Admitting at bedside 

## 2016-07-16 NOTE — ED Notes (Signed)
Hemoccult at bedside. 

## 2016-07-16 NOTE — ED Notes (Signed)
Upon this RN attempted to remove patient's current foley and replace with new one per Dr. Jolyn Nap request, patient reported that current foley was placed yesterday by home health care nurse. ED MD Plunkett made aware and stated current foley was fine to remain in place.

## 2016-07-16 NOTE — Progress Notes (Signed)
FPTS Interim Progress Note  S: Patient feels "ok." She endorses lower back and leg pain that she says is chronic and unchanged. She notes that she did feel some increased pain in her LLE prior to admission, but did not feel febrile or ill.   O: BP 127/87 (BP Location: Left Wrist)   Pulse 100   Temp 98.7 F (37.1 C) (Axillary)   Resp 13   Ht 5\' 3"  (1.6 m)   Wt (!) 335 lb 5.1 oz (152.1 kg)   LMP 10/07/2010   SpO2 97%   BMI 59.40 kg/m   Gen: obese female lying in bed with Bacon in place CV: regular rate, no murmur Resp: exam limited by body habitus, CTAB, no increased WOB.  Abdomen: SNTND, +BS  A/P: Sepsis likely secondary to LLE cellulitis: continue current antibiotics. Vital signs normalizing after vanc/zosyn and stress dose steroids.  - lactic acid now and q3 until normalized  Garth Bigness, MD 07/16/2016, 10:54 PM PGY-1, [facility] Service pager [phone]

## 2016-07-16 NOTE — ED Provider Notes (Signed)
TIME SEEN: 5:26 AM  CHIEF COMPLAINT: Shortness of breath, abdominal pain, lightheadedness, fever  HPI: Patient is a 61 year old morbidly obese female with history of multiple sclerosis, spinal stenosis who has no sensation or movement of her bilateral lower extremities who is mostly bedbound and has a indwelling Foley catheter who presents to the emergency department not feeling well today. States she has felt lightheaded and short of breath. She denies having any current chest pain. She states she has had some left lower quadrant pain. States that she has venous stasis of her bilateral lower extremities and has her legs wrapped regularly by home health and they told her today they were concerned for cellulitis. She has been on doxycycline for the cellulitis. She denies vomiting or diarrhea. No bloody stools or melena. No new numbness or focal weakness. She is on prednisone chronically. Her PCP is with family medicine.  ROS: See HPI Constitutional:  fever  Eyes: no drainage  ENT: no runny nose   Cardiovascular:  no chest pain  Resp:  SOB  GI: no vomiting GU: no dysuria Integumentary: no rash  Allergy: no hives  Musculoskeletal: no leg swelling  Neurological: no slurred speech ROS otherwise negative  PAST MEDICAL HISTORY/PAST SURGICAL HISTORY:  Past Medical History:  Diagnosis Date  . Gout   . Lower back pain   . Lumbar spondylosis   . MS (multiple sclerosis) (HCC)   . Numbness   . Spinal stenosis   . Weakness     MEDICATIONS:  Prior to Admission medications   Medication Sig Start Date End Date Taking? Authorizing Provider  acetaminophen (TYLENOL) 325 MG tablet Take 2 tablets (650 mg total) by mouth every 6 (six) hours as needed (mild pain, fever >100.4). Patient not taking: Reported on 05/14/2016 04/05/16   Tillman Sers, DO  aspirin EC 325 MG tablet Take 325 mg by mouth daily.    Historical Provider, MD  calcium carbonate (TUMS - DOSED IN MG ELEMENTAL CALCIUM) 500 MG chewable  tablet Chew 1 tablet by mouth as needed for indigestion or heartburn.    Historical Provider, MD  calcium-vitamin D (OSCAL WITH D) 500-200 MG-UNIT per tablet Take 1 tablet by mouth daily.      Historical Provider, MD  Cholecalciferol (VITAMIN D3) 1000 units CAPS Take 4,000 Units by mouth daily.    Historical Provider, MD  Coenzyme Q10 (CO Q 10 PO) Take 1 tablet by mouth daily.    Historical Provider, MD  colchicine 0.6 MG tablet Take 0.6 mg by mouth daily as needed (gout).     Historical Provider, MD  collagenase (SANTYL) ointment Apply topically daily. 04/05/16   Tillman Sers, DO  Cyanocobalamin (VITAMIN B-12 PO) Take 1 tablet by mouth daily.    Historical Provider, MD  diazepam (VALIUM) 5 MG tablet TAKE 1 TABLET BY MOUTH EVERY 6 HOURS AS NEEDED FOR LEG SPASMS 05/30/16   Moses Manners, MD  dicyclomine (BENTYL) 20 MG tablet Take 1 tablet (20 mg total) by mouth 3 (three) times daily as needed for spasms (abdominal pain). Patient not taking: Reported on 05/14/2016 03/15/16   Trixie Dredge, PA-C  doxycycline (VIBRA-TABS) 100 MG tablet TAKE 1 TABLET BY MOUTH 2 TIMES DAILY. 07/04/16   Moses Manners, MD  furosemide (LASIX) 20 MG tablet TAKE 1 TABLET BY MOUTH ONCE DAILY Patient taking differently: No sig reported 02/15/16   Moses Manners, MD  gabapentin (NEURONTIN) 300 MG capsule TAKE 1 CAPSULE BY MOUTH 3 TIMES DAILY. 06/10/16  Moses Manners, MD  glucose blood test strip Test blood sugar three times per day. 05/14/16   Moses Manners, MD  ibuprofen (ADVIL,MOTRIN) 400 MG tablet Take 1 tablet (400 mg total) by mouth every 6 (six) hours as needed for headache or moderate pain. Patient not taking: Reported on 05/14/2016 04/05/16   Tillman Sers, DO  insulin aspart (NOVOLOG) 100 UNIT/ML FlexPen Inject 10 Units into the skin 3 (three) times daily with meals as needed for high blood sugar (if CBG > 200). 05/14/16   Moses Manners, MD  Insulin Detemir (LEVEMIR FLEXTOUCH) 100 UNIT/ML Pen Inject 20 Units into  the skin daily at 10 pm. 07/10/16   Moses Manners, MD  Insulin Pen Needle 31G X 8 MM MISC Use to inject insulin 4x/day 05/16/16   Moses Manners, MD  lisinopril (PRINIVIL,ZESTRIL) 10 MG tablet TAKE 1 TABLET BY MOUTH ONCE DAILY 08/29/15   Moses Manners, MD  Magnesium 250 MG TABS Take 250 mg by mouth daily.    Historical Provider, MD  methocarbamol (ROBAXIN) 750 MG tablet TAKE 2 TABLETS (1,500 MG TOTAL) BY MOUTH 3 (THREE) TIMES DAILY. Patient taking differently: Take 750 mg by mouth 6 (six) times daily.  12/14/15   Moses Manners, MD  metoCLOPramide (REGLAN) 10 MG tablet Take 1 tablet (10 mg total) by mouth every 6 (six) hours as needed for nausea or vomiting. Patient not taking: Reported on 05/14/2016 03/15/16   Trixie Dredge, PA-C  Multiple Vitamins-Minerals (MULTIVITAMIN PO) Take 1 tablet by mouth daily.    Historical Provider, MD  OVER THE COUNTER MEDICATION Take 2 drops by mouth daily. Hemp Extract    Historical Provider, MD  oxybutynin (DITROPAN-XL) 5 MG 24 hr tablet Take 1 tablet (5 mg total) by mouth at bedtime. 12/05/15   Suanne Marker, MD  predniSONE (DELTASONE) 10 MG tablet Take 3 tablets (30 mg total) by mouth daily with breakfast. Patient taking differently: Take 30-40 mg by mouth daily with breakfast.  03/06/16   Moses Manners, MD  traMADol (ULTRAM) 50 MG tablet TAKE 1 TABLET BY MOUTH 4 TIMES DAILY 01/24/16   Moses Manners, MD  trolamine salicylate (ASPERCREME) 10 % cream Apply 1 application topically as needed for muscle pain.    Historical Provider, MD    ALLERGIES:  Allergies  Allergen Reactions  . Metformin And Related Nausea Only    GI intolerance.    SOCIAL HISTORY:  Social History  Substance Use Topics  . Smoking status: Never Smoker  . Smokeless tobacco: Never Used  . Alcohol use No    FAMILY HISTORY: Family History  Problem Relation Age of Onset  . Heart disease Mother   . Diabetes Mother   . Cancer Father     EXAM: BP (!) 70/45 (BP Location: Right  Arm)   Pulse (!) 121   Temp 98.8 F (37.1 C)   Resp 22   Ht 5\' 3"  (1.6 m)   Wt 283 lb (128.4 kg)   LMP 10/07/2010   SpO2 92%   BMI 50.13 kg/m  CONSTITUTIONAL: Alert and oriented x 3 and responds appropriately to questions. Chronically ill-appearing, febrile, morbidly obese, hypotensive, mentating normally HEAD: Normocephalic, atraumatic EYES: Conjunctivae clear, pupils appear equal, EOMI, no conjunctival pallor ENT: normal nose; moist mucous membranes; No pharyngeal erythema or petechiae, no tonsillar hypertrophy or exudate, no uvular deviation, no unilateral swelling, no trismus or drooling, no muffled voice, normal phonation, no stridor, no dental caries present, no  drainable dental abscess noted, no Ludwig's angina, tongue sits flat in the bottom of the mouth, no angioedema, no facial erythema or warmth, no facial swelling; no pain with movement of the neck. NECK: Supple, no meningismus, no nuchal rigidity, no LAD  CARD: Regular and tachycardic; S1 and S2 appreciated; no murmurs, no clicks, no rubs, no gallops RESP: Normal chest excursion without splinting or tachypnea; breath sounds clear and equal bilaterally; no wheezes, no rhonchi, no rales, mild hypoxia with laying flat, no respiratory distress, speaking full sentences ABD/GI: Normal bowel sounds; non-distended; soft, mildly tender to palpation in the left lower quadrant, no rebound, no guarding, no peritoneal signs, no hepatosplenomegaly GU:  Chronic indwelling Foley catheter BACK:  The back appears normal and is non-tender to palpation, there is no CVA tenderness RECTAL:  No gross blood or melena, brown appearing soft stool, external nonbleeding hemorrhoids, slightly diminished rectal tone, guaiac negative, stage I sacral decubitus breakdown EXT: Bilateral lower extremity edema and erythema worse on the right leg with a 4 x 5 cm ruptured blister with purulent drainage, normal capillary refill; no cyanosis, no calf tenderness or  swelling, compartments are soft    SKIN: Normal color for age and race; warm; no rash other than cellulitis to bilateral lower extremity is worse on the right leg, no petechiae or purpura, no blisters or desquamation, no hives NEURO: Unable to move or feel her bilateral lower extremities which is chronic for her. Normal movement sensation of the bilateral upper extremities. No facial droop. Normal speech. PSYCH: The patient's mood and manner are appropriate. Grooming and personal hygiene are appropriate.  MEDICAL DECISION MAKING: Patient here with septic shock. She is initially hypotensive and tachycardic. Cellulitis of the right lower extremity may be the source of her infection. Compartments are soft and there is no sign of abscess that needs drainage and no sign of septic arthritis. We'll give IV fluids, broad-spectrum antibiotics. We'll obtain labs, cultures, chest x-ray and urine. Once blood pressure has improved we will obtain CTs of her chest and abdomen.  ED PROGRESS: Patient's labs show leukocytosis of 17,000 with left shift. Lactate is elevated. Hemoccult negative. Troponin negative. Blood pressure has improved in her maps have been greater than 65 for the past hour. I do not feel she needs vasopressors at this time. She is still oriented 3. She reports that if she needed vasopressors, central line or intubation for brief period of time she would be okay with that. She is okay with IV fluids, antibiotics and admission. She would not want chest compressions for cardiac arrest.    Sepsis - Repeat Assessment  Performed at:    6:45 AM  Vitals     Blood pressure 100/57, pulse 100, temperature 98.8 F (37.1 C), resp. rate 14, height 5\' 3"  (1.6 m), weight 283 lb (128.4 kg), last menstrual period 10/07/2010, SpO2 96 %.  Heart:     Tachycardic  Lungs:    CTA  Capillary Refill:   <2 sec  Peripheral Pulse:   Radial pulse palpable  Skin:     Normal Color    7:45 AM  Pt's shortness of  breath that is resolved. Her abdominal pain is also improving. This may be related to hypoperfusion. Discussed patient's case with family medicine resident who will see the patient in the emergency department. She agrees to follow-up on patient's urinalysis as well as patient's CT scans of her chest and abdomen. She would like for [place] to change the CT of her chest  to a CTA to rule out pulmonary embolus given patient is at high risk given she is chronically immobilized. Patient and sister have been up with plan. Family medicine resident will place admission orders.  I reviewed all nursing notes, vitals, pertinent previous records, EKGs, lab and urine results, imaging (as available).    EKG Interpretation  Date/Time:  Tuesday July 16 2016 05:00:32 EDT Ventricular Rate:  119 PR Interval:    QRS Duration: 111 QT Interval:  367 QTC Calculation: 515 R Axis:   94 Text Interpretation:  Right and left arm electrode reversal, interpretation assumes no reversal Sinus tachycardia Ventricular premature complex Lateral infarct, old Minimal ST elevation, inferior leads Prolonged QT interval Baseline wander in lead(s) V1 No significant change since last tracing Confirmed by Khyrie Masi,  DO, Alphonso Gregson ([id number]) on 07/16/2016 5:25:57 AM        CRITICAL CARE Performed by: Raelyn Number   Total critical care time: 45 minutes  Critical care time was exclusive of separately billable procedures and treating other patients.  Critical care was necessary to treat or prevent imminent or life-threatening deterioration.  Critical care was time spent personally by me on the following activities: development of treatment plan with patient and/or surrogate as well as nursing, discussions with consultants, evaluation of patient's response to treatment, examination of patient, obtaining history from patient or surrogate, ordering and performing treatments and interventions, ordering and review of laboratory studies, ordering and  review of radiographic studies, pulse oximetry and re-evaluation of patient's condition.    Layla Maw Henretter Piekarski, DO 07/16/16 [phone]

## 2016-07-16 NOTE — Progress Notes (Signed)
Pharmacy Antibiotic Note  Barbara [name] is a 61 y.o. female admitted on 07/16/2016 with sepsis likely due to LLE cellulitis. LA 3.4 > 1.95, WBC 17.6, Afebrile. To start empiric abx.   Plan: -Vancomycin 2500 mg IV x1 then 1g/12h -Zosyn 3.375 g IV q8h -Monitor renal fx, cultures, duration of therapy   Height: 5\' 3"  (160 cm) Weight: 283 lb (128.4 kg) IBW/kg (Calculated) : 52.4  Temp (24hrs), Avg:98.8 F (37.1 C), Min:98.8 F (37.1 C), Max:98.8 F (37.1 C)   Recent Labs Lab 07/16/16 0519 07/16/16 0527 07/16/16 0923  WBC  --  17.6*  --   CREATININE 1.00 0.98  --   LATICACIDVEN 3.41*  --  1.95*    Estimated Creatinine Clearance: 80.8 mL/min (by C-G formula based on SCr of 0.98 mg/dL).    Allergies  Allergen Reactions  . Metformin And Related Nausea Only    GI intolerance.    Antimicrobials this admission: 3/13 vancomycin > 3/13 zosyn >  Dose adjustments this admission: N/A  Microbiology results: 3/13 blood cx: 3/13 urine cx:  Thank you for allowing pharmacy to be a part of this patient's care.  [place] Friday 07/16/2016 12:04 PM

## 2016-07-16 NOTE — ED Triage Notes (Signed)
Patient presents to ed via [facility] states she was on the commode having a bowel movement and started having tremors and spasms in her legs then started having chest pain and sob. Upon ems arrival chest pain was 7/10 was given ntg x 1 pain reduced to 5/10, however ems states patient c/o being more sob and increased leg pain.

## 2016-07-16 NOTE — H&P (Signed)
Family Medicine Teaching [facility] Admission History and Physical Service Pager: [phone]  Patient name: Barbara [name] Medical record number: [id number] Date of birth: [birth year] Age: 61 y.o. Gender: female  Primary Care Provider: Sanjuana Letters, MD Consultants: none Code Status: DNR - discussed on admission - would allow intubation if needed for procedure such as surgery, but not for resuscitation   Chief Complaint: CP, SOB, leg spasms, abd pain  Assessment and Plan: Barbara [name] is a 61 y.o. female presenting with severe sepsis, LLE cellulitis, dyspnea, chest pain, LLQ pain. PMH is significant for multiple sclerosis with spastic paraplegia, chronic prednisone use, HTN, T2 DM.  Severe Sepsis, most likely 2/2 LLE Cellulitis: Lactic acidosis improving from 3.41 > 1.95.  Leukocytosis of 17.6. Initially tachycardic to 121, tachypnic, and blood pressure low at 70/45.  Improving s/p 4L NS bolus in ED. S/p dose of Vanc/Zosyn.  No evidence of pneumonia, BCx pending, UA/UCx pending (patient does have indwelling foley but denies urinary symptoms).  LLE cellulitis appears most likely source.  - Continue Vanc/Zosyn - Blood cultures pending - f/u UA and UCx - Wound care consult for LLE wound - Continue IV fluid resuscitation - stress dose steroids as adrenal insufficiency may be contributing to hypotension.  Dyspnea and Chest pain: History is somewhat concerning for cardiac chest pain even description, location, and associated symptoms. Chest pain has resolved however. Initial EKG nonischemic and initial troponin negative. CTA chest without evidence of pulmonary embolism. Could also be related to OHS, sepsis, OSA. - Cycle troponins - Repeat EKG in AM - risk stratify: A1c, lipids, TSH - Monitor on telemetry - O2 when necessary  LLQ abd pain: CT abd/pelvis with moderate stool burden, hepatomegaly with hepatic steatosis, cholelithiasis.  Her pain is most likely related to  constipation given location and CT findings. - Bowel regimen: Colace bid, Miralax daily - titrate to achieve 1 soft BM daily - Serial abdominal exams  Adrenal Insufficiency, Multiple Sclerosis with spastic paraplegia on chronic steroids: Patient with quick taper from 30mg  daily of prednisone to 10mg  daily of prednisone yesterday.  Followed by [facility] neurology. Was previously on amino modulator, but was unable to afford this after becoming disabled. - Continue Gabapentin 300mg  TID, Robaxin, Tramadol - Stress dose steroids - Hydrocortisone IV 100mg  q12h - Will likely need a very slow taper of steroids if planned in the future  HTN: Currently hypotensive (possibly related to sepsis vs adrenal insufficiency). Need to ensure BP readings are accurate with appropriate cuff size given morbid obesity - hold home lasix, lisinopril - Continue home aspirin  T2DM: Taking Levemir 20 units qhs and Novolog 10 units TID ac at home  - Levemir 10 units qhs - titrate prn - hold home Novolog for now as patient may not be eating well - moderate SSI - check A1c  FEN/GI: NS @ 166mL/hr, Carb/heart diet Prophylaxis: Lovenox  Disposition: Admit to SDU, FPTS, attending [place]. PT/OT/SW/CM consults for possible SNF placement vs HH needs.  D/c pending medical stability and possible placement  History of Present Illness: Barbara [name] is a 62 y.o. female presenting with CP, SOB, abd pain, and leg spasms.  Patient came to ED this AM after experiencing severe b/l LE spasms starting around 2am.  She has these intermittently related to her MS, but they were more severe than usual.  She was unable to get her gabapentin which usually eases pain from these spasms.  She then became acutely dyspneic with a substernal chest pressure that did not radiate.  This was also associated with nausea. This did not seem to worsen or improve with movement or rest. This has improved and the chest pain has resolved since being in the  emergency department this morning.  She also reports left lower quadrant abdominal pain for the last 5-7 days. This is an aching pain that seems constant. She typically has one bowel movement a day and this has not changed. She denies any melena, hematochezia, vomiting. Nothing seems to improve or worsen this pain.  She has known lower extremity venous stasis with chronic skin changes. Her home health nurse that helps with wound care and Foley management noted a blister on her left calf one week ago. Her home health nurse then reported to her yesterday that this appeared cellulitic.  She does not know if there is been drainage. She denies any fevers, decreased appetite, change in mental state.  There is a telephone note in the chart from yesterday from the home health nurse stating that there was blood noted after removing the Foley catheter, however, the patient is not aware of any vaginal or urinary bleeding, dysuria.  She has a chronic indwelling Foley for her neurogenic bladder since 03/2016.  Of note, patient has been taking 30 mg of prednisone daily for several months, but she noticed 2 weeks ago that on her discharge paperwork from her recent SNF stay that she was supposed to stop her prednisone. Last week, she decreased to 20 mg daily of prednisone. Yesterday, she decreased to 10 mg daily of her prednisone.  Review Of Systems: Per HPI Otherwise 12 point review of systems was performed and was unremarkable.  Patient Active Problem List   Diagnosis Date Noted  . Sepsis due to cellulitis (HCC) 07/16/2016  . Lower extremity weakness 04/02/2016  . Decubitus ulcer of sacral region   . Impaired mobility and activities of daily living 03/07/2016  . Palliative care encounter 03/07/2016  . Current chronic use of systemic steroids 03/07/2016  . DM type 2, not at goal [facility]) 03/06/2016  . Cellulitis and abscess of leg 08/28/2015  . MS (multiple sclerosis) (HCC)   . Weakness of both lower  extremities   . Absence of bladder continence   . Essential hypertension   . Spastic paraplegia (HCC)   . Weakness 07/12/2015  . Encounter for chronic pain management 04/21/2014  . Chronic venous insufficiency 11/05/2012  . Spinal stenosis in cervical region 06/03/2011  . Multiple sclerosis (HCC) 12/07/2010  . 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 Spinal stenosis of lumbar region 12/07/2010  . Hypertension 12/07/2010  . Obesity 12/07/2010  . Chronic gout 12/07/2010   Past Medical History: Past Medical History:  Diagnosis Date  . Diabetes mellitus without complication (HCC)   . Gout   . Hypertension   . Lower back pain   . Lumbar spondylosis   . MS (multiple sclerosis) (HCC)   . Numbness   . Spinal stenosis   . Weakness    Past Surgical History: Past Surgical History:  Procedure Laterality Date  . CERVICAL DISC SURGERY     Qusetions C5-6 C6-7 and C7-T1   Social History: Social History  Substance Use Topics  . Smoking status: Never Smoker  . Smokeless tobacco: Never Used  . Alcohol use No   Additional social history: Lives alone. Home health services have recently slowed and home health nurse is only coming once weekly for Foley care and wound care. Never smoked. Denies alcohol use. Denies illicit drug use.   Please  also refer to relevant sections of EMR.  Family History: Family History  Problem Relation Age of Onset  . Heart disease Mother   . Diabetes Mother   . Cancer Father    Allergies and Medications: Allergies  Allergen Reactions  . Metformin And Related Nausea Only    GI intolerance.   No current facility-administered medications on file prior to encounter.    Current Outpatient Prescriptions on File Prior to Encounter  Medication Sig Dispense Refill  . acetaminophen (TYLENOL) 325 MG tablet Take 2 tablets (650 mg total) by mouth every 6 (six) hours as needed (mild pain, fever >100.4). 30 tablet 0  . aspirin EC 325 MG tablet Take 325 mg by mouth daily.     . calcium carbonate (TUMS - DOSED IN MG ELEMENTAL CALCIUM) 500 MG chewable tablet Chew 1 tablet by mouth as needed for indigestion or heartburn.    . calcium-vitamin D (OSCAL WITH D) 500-200 MG-UNIT per tablet Take 1 tablet by mouth daily.      . Cholecalciferol (VITAMIN D3) 1000 units CAPS Take 4,000 Units by mouth daily.    . Coenzyme Q10 (CO Q 10 PO) Take 1 tablet by mouth daily.    . colchicine 0.6 MG tablet Take 0.6 mg by mouth daily as needed (gout).     . Cyanocobalamin (VITAMIN B-12 PO) Take 1 tablet by mouth daily.    Marland Kitchen doxycycline (VIBRA-TABS) 100 MG tablet TAKE 1 TABLET BY MOUTH 2 TIMES DAILY. 20 tablet 0  . furosemide (LASIX) 20 MG tablet TAKE 1 TABLET BY MOUTH ONCE DAILY (Patient taking differently: No sig reported) 90 tablet 3  . gabapentin (NEURONTIN) 300 MG capsule TAKE 1 CAPSULE BY MOUTH 3 TIMES DAILY. 270 capsule 3  . glucose blood test strip Test blood sugar three times per day. 100 each 12  . ibuprofen (ADVIL,MOTRIN) 400 MG tablet Take 1 tablet (400 mg total) by mouth every 6 (six) hours as needed for headache or moderate pain. 30 tablet 0  . insulin aspart (NOVOLOG) 100 UNIT/ML FlexPen Inject 10 Units into the skin 3 (three) times daily with meals as needed for high blood sugar (if CBG > 200). 15 mL 11  . Insulin Detemir (LEVEMIR FLEXTOUCH) 100 UNIT/ML Pen Inject 20 Units into the skin daily at 10 pm. 15 mL 11  . Insulin Pen Needle 31G X 8 MM MISC Use to inject insulin 4x/day 300 each 6  . lisinopril (PRINIVIL,ZESTRIL) 10 MG tablet TAKE 1 TABLET BY MOUTH ONCE DAILY 90 tablet 3  . Magnesium 250 MG TABS Take 250 mg by mouth daily.    . methocarbamol (ROBAXIN) 750 MG tablet TAKE 2 TABLETS (1,500 MG TOTAL) BY MOUTH 3 (THREE) TIMES DAILY. (Patient taking differently: Take 750 mg by mouth 6 (six) times daily. ) 540 tablet 3  . Multiple Vitamins-Minerals (MULTIVITAMIN PO) Take 1 tablet by mouth daily.    Marland Kitchen OVER THE COUNTER MEDICATION Take 2 drops by mouth daily. Hemp Extract    .  oxybutynin (DITROPAN-XL) 5 MG 24 hr tablet Take 1 tablet (5 mg total) by mouth at bedtime. 90 tablet 4  . predniSONE (DELTASONE) 10 MG tablet Take 3 tablets (30 mg total) by mouth daily with breakfast. (Patient taking differently: Take 10 mg by mouth daily with breakfast. ) 270 tablet 3  . traMADol (ULTRAM) 50 MG tablet TAKE 1 TABLET BY MOUTH 4 TIMES DAILY 360 tablet PRN  . trolamine salicylate (ASPERCREME) 10 % cream Apply 1 application topically as needed  for muscle pain.    . collagenase (SANTYL) ointment Apply topically daily. (Patient not taking: Reported on 07/16/2016) 15 g 0  . diazepam (VALIUM) 5 MG tablet TAKE 1 TABLET BY MOUTH EVERY 6 HOURS AS NEEDED FOR LEG SPASMS (Patient not taking: Reported on 07/16/2016) 90 tablet 5  . dicyclomine (BENTYL) 20 MG tablet Take 1 tablet (20 mg total) by mouth 3 (three) times daily as needed for spasms (abdominal pain). (Patient not taking: Reported on 05/14/2016) 20 tablet 0  . metoCLOPramide (REGLAN) 10 MG tablet Take 1 tablet (10 mg total) by mouth every 6 (six) hours as needed for nausea or vomiting. (Patient not taking: Reported on 05/14/2016) 20 tablet 0    Objective: BP (!) 102/51   Pulse 102   Temp 98.8 F (37.1 C)   Resp 12   Ht 5\' 3"  (1.6 m)   Wt 283 lb (128.4 kg)   LMP 10/07/2010   SpO2 (!) 82%   BMI 50.13 kg/m  Exam: General: Morbidly obese female, laying in bed, NAD HEENT: NCAT, dry MM, PERRL, EOMI, OP clear Neck: Supple, no LAD Cardiovascular: Mildly tachycardic, regular rhythm, no murmurs Respiratory: CTA B, nonlabored, difficult exam due to immobility and morbid obesity Abdomen: Soft, nondistended, diffusely tender to palpation, worse in LLQ, no rebound or guarding, normoactive bowel sounds Extremities: Bilateral lower extremities with chronic venous stasis changes, left lower extremity with 6 cm shallow ulceration with purulent drainage and surrounding erythema that is tender to touch Skin: No rashes, ulceration as above Neuro:  Alert and oriented, speech appropriate, able to move all extremities, but grossly weak in lower extremities Psych: Appropriate affect and mood, no evidence of AVH  Labs and Imaging: CBC BMET   Recent Labs Lab 07/16/16 0527  WBC 17.6*  HGB 12.5  HCT 39.2  PLT 207    Recent Labs Lab 07/16/16 0527  NA 139  K 3.8  CL 96*  CO2 31  BUN 27*  CREATININE 0.98  GLUCOSE 226*  CALCIUM 9.2     Lactic Acid 3.41  Troponin 0  FOBT neg  Ct Angio Chest Pe W And/or Wo Contrast  Result Date: 07/16/2016 CLINICAL DATA:  Shortness of breath. EXAM: CT ANGIOGRAPHY CHEST WITH CONTRAST TECHNIQUE: Multidetector CT imaging of the chest was performed using the standard protocol during bolus administration of intravenous contrast. Multiplanar CT image reconstructions and MIPs were obtained to evaluate the vascular anatomy. CONTRAST:  100 cc Omnipaque 370 COMPARISON:  Chest x-ray dated 07/16/2016 FINDINGS: Cardiovascular: Satisfactory opacification of the pulmonary arteries to the segmental level. No evidence of pulmonary embolism. Normal heart size. No pericardial effusion. Mediastinum/Nodes: No enlarged mediastinal, hilar, or axillary lymph nodes. Thyroid gland, trachea, and esophagus demonstrate no significant findings. Lungs/Pleura: There faint patchy areas haziness in both lungs with minimal linear atelectasis both lung bases. No effusions. Upper Abdomen:   Hepatic steatosis.  Slight hepatomegaly. Musculoskeletal: No chest wall abnormality. No acute or significant osseous findings. Review of the MIP images confirms the above findings. IMPRESSION: 1. No pulmonary emboli. 2. Patchy areas of haziness in both lungs which are nonspecific. This could represent slight noncardiogenic pulmonary edema or nonspecific pneumonitis. Electronically Signed   By: Francene Boyers M.D.   On: 07/16/2016 09:06   Ct Abdomen Pelvis W Contrast  Result Date: 07/16/2016 CLINICAL DATA:  Left lower quadrant abdominal pain. EXAM: CT  ABDOMEN AND PELVIS WITH CONTRAST TECHNIQUE: Multidetector CT imaging of the abdomen and pelvis was performed using the standard protocol following bolus administration  of intravenous contrast. CONTRAST:  100 cc Isovue 370 COMPARISON:  CT scan dated 03/15/2016 FINDINGS: Lower chest: No acute abnormality. Hepatobiliary: Slight hepatomegaly with hepatic steatosis. Multiple noncalcified stones in the gallbladder. No dilated bile ducts. Pancreas: Unremarkable. No pancreatic ductal dilatation or surrounding inflammatory changes. Spleen: Normal in size without focal abnormality. Adrenals/Urinary Tract: Adrenal glands are normal. 11 mm cyst on the lower pole of the left kidney. Foley catheter in the decompressed bladder. Stomach/Bowel: Stomach is within normal limits. Appendix appears normal. No evidence of bowel wall thickening, distention, or inflammatory changes. Moderate stool throughout the colon. Large amount of stool in the rectum. Vascular/Lymphatic: Slight aortic atherosclerosis.  No adenopathy. Reproductive: Uterus and bilateral adnexa are unremarkable. Other: Nonspecific subcutaneous edema at the panniculus and subcutaneous fat of the buttocks flanks. Musculoskeletal: Prior posterior decompression at L2-3 and L3-4. Severe spinal stenosis at all 4 5 L5-S1. IMPRESSION: 1. Large amount of stool in the rectum. Moderate stool throughout the remainder of the colon. 2. Nonspecific edema in the subcutaneous fat of the panniculus at buttocks and flanks. 3. Hepatomegaly with hepatic steatosis. 4. Cholelithiasis. 5. Severe spinal stenosis at L4-5 and L5-S1. 6. Slight aortic atherosclerosis. Electronically Signed   By: Francene Boyers M.D.   On: 07/16/2016 09:02   Dg Chest Port 1 View  Result Date: 07/16/2016 CLINICAL DATA:  Chest pain and shortness of breath tonight. EXAM: PORTABLE CHEST 1 VIEW COMPARISON:  Chest radiograph June 04, 2014 FINDINGS: Lower chest incompletely imaged. The cardiac silhouette is upper limits  of normal in size, mediastinal silhouette is nonsuspicious. Mild bronchitic changes. Strandy densities LEFT mid and lower lung zone. No pneumothorax. ACDF. Large body habitus. IMPRESSION: Borderline cardiomegaly.  LEFT atelectasis/ scarring. Electronically Signed   By: Awilda Metro M.D.   On: 07/16/2016 05:54     Erasmo Downer, MD 07/16/2016, 10:33 AM PGY-3, [facility] FPTS Intern pager: [phone], text pages welcome

## 2016-07-17 ENCOUNTER — Ambulatory Visit: Payer: Self-pay | Admitting: *Deleted

## 2016-07-17 ENCOUNTER — Inpatient Hospital Stay (HOSPITAL_COMMUNITY): Payer: PPO

## 2016-07-17 DIAGNOSIS — R0600 Dyspnea, unspecified: Secondary | ICD-10-CM

## 2016-07-17 DIAGNOSIS — A419 Sepsis, unspecified organism: Secondary | ICD-10-CM

## 2016-07-17 DIAGNOSIS — R6521 Severe sepsis with septic shock: Secondary | ICD-10-CM

## 2016-07-17 LAB — GLUCOSE, CAPILLARY
GLUCOSE-CAPILLARY: 127 mg/dL — AB (ref 65–99)
GLUCOSE-CAPILLARY: 235 mg/dL — AB (ref 65–99)
Glucose-Capillary: 176 mg/dL — ABNORMAL HIGH (ref 65–99)
Glucose-Capillary: 137 mg/dL — ABNORMAL HIGH (ref 65–99)

## 2016-07-17 LAB — BASIC METABOLIC PANEL
ANION GAP: 8 (ref 5–15)
BUN: 15 mg/dL (ref 6–20)
CALCIUM: 8.9 mg/dL (ref 8.9–10.3)
CO2: 29 mmol/L (ref 22–32)
Chloride: 105 mmol/L (ref 101–111)
Creatinine, Ser: 0.75 mg/dL (ref 0.44–1.00)
GFR calc Af Amer: >60 mL/min (ref >60)
GFR calc non Af Amer: >60 mL/min (ref >60)
GLUCOSE: 171 mg/dL — AB (ref 65–99)
POTASSIUM: 4.2 mmol/L (ref 3.5–5.1)
SODIUM: 142 mmol/L (ref 135–145)

## 2016-07-17 LAB — LIPID PANEL
CHOL/HDL RATIO: 6 ratio
Cholesterol: 211 mg/dL — ABNORMAL HIGH (ref 0–200)
HDL: 35 mg/dL — AB (ref >40)
LDL CALC: 138 mg/dL — AB (ref 0–99)
Triglycerides: 191 mg/dL — ABNORMAL HIGH (ref <150)
VLDL: 38 mg/dL (ref 0–40)

## 2016-07-17 LAB — CBC
HCT: 35 % — ABNORMAL LOW (ref 36.0–46.0)
Hemoglobin: 11.5 g/dL — ABNORMAL LOW (ref 12.0–15.0)
MCH: 30 pg (ref 26.0–34.0)
MCHC: 32.9 g/dL (ref 30.0–36.0)
MCV: 91.4 fL (ref 78.0–100.0)
PLATELETS: 184 10*3/uL (ref 150–400)
RBC: 3.83 MIL/uL — AB (ref 3.87–5.11)
RDW: 18 % — ABNORMAL HIGH (ref 11.5–15.5)
WBC: 10.5 10*3/uL (ref 4.0–10.5)

## 2016-07-17 LAB — HEMOGLOBIN A1C
Hgb A1c MFr Bld: 6.6 % — ABNORMAL HIGH (ref 4.8–5.6)
Mean Plasma Glucose: 143 mg/dL

## 2016-07-17 LAB — HIV ANTIBODY (ROUTINE TESTING W REFLEX): HIV Screen 4th Generation wRfx: NONREACTIVE

## 2016-07-17 LAB — TROPONIN I: Troponin I: <0.03 ng/mL (ref <0.03)

## 2016-07-17 LAB — CORTISOL-AM, BLOOD: Cortisol - AM: 13.6 ug/dL (ref 6.7–22.6)

## 2016-07-17 LAB — ECHOCARDIOGRAM COMPLETE
Height: 63 in
Weight: 4480 oz

## 2016-07-17 MED ORDER — PERFLUTREN LIPID MICROSPHERE
INTRAVENOUS | Status: AC
  Administered 2016-07-17: 2 mL
  Filled 2016-07-17: qty 10

## 2016-07-17 MED ORDER — PREDNISONE 50 MG PO TABS
25.0000 mg | ORAL_TABLET | Freq: Every day | ORAL | Status: AC
  Administered 2016-07-17: 25 mg via ORAL
  Filled 2016-07-17: qty 1

## 2016-07-17 MED ORDER — PREDNISONE 50 MG PO TABS: 25.0000 mg | ORAL_TABLET | Freq: Every day | ORAL | Status: DC

## 2016-07-17 MED ORDER — ENOXAPARIN SODIUM 80 MG/0.8ML ~~LOC~~ SOLN
65.0000 mg | SUBCUTANEOUS | Status: DC
  Administered 2016-07-17 – 2016-07-19 (×3): 65 mg via SUBCUTANEOUS
  Filled 2016-07-17 (×3): qty 0.8

## 2016-07-17 MED ORDER — PREDNISONE 50 MG PO TABS
50.0000 mg | ORAL_TABLET | Freq: Every day | ORAL | Status: DC
  Administered 2016-07-17 – 2016-07-20 (×4): 50 mg via ORAL
  Filled 2016-07-17 (×4): qty 1

## 2016-07-17 MED ORDER — PERFLUTREN LIPID MICROSPHERE
1.0000 mL | INTRAVENOUS | Status: AC | PRN
  Filled 2016-07-17: qty 10

## 2016-07-17 MED ORDER — PREDNISONE 50 MG PO TABS: 25.0000 mg | ORAL_TABLET | Freq: Two times a day (BID) | ORAL | Status: DC

## 2016-07-17 NOTE — Discharge Summary (Signed)
Family Medicine Teaching [facility] Discharge Summary  Patient name: Barbara [name] Medical record number: [id number] Date of birth: May 06, 1956 Age: 61 y.o. 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 Tachycardic, tachypnic and hypotensive in ED. Lactic acidosis of 3.41. Given 4L NS bolus and vanc/zosyn. Sepsis thought to be secondary to LLE cellulitis (new blister developed after removal of Unna boot 1 day prior to admission). Due to CP, CTA performed and no PE seen. CT abdomen and pelvis performed due to lower abdominal pain, no acute findings. CP and abdominal pain resolved with hydration and antibiotics. Urine found to have Klebsiella UTI and considered an additional possible source of sepsis, given patient's chronic catheterization and neurogenic bladder with inability to perceive UTI symptoms beyond abdominal pain. Antibiotics narrowed to bactrim to cover both UTI and cellulitis once urine speciation received.  Prior to discharge, patient was afebrile on bactrim for several days. Home lasix 20mg  started on HD#2 as hypotension resolved. Echo repeated with EF 60-65%. On 3/16, patient with O2 requirement during the day (had previously been only overnight) and CXR performed suggesting pulmonary edema. Lasix 40mg  IV BID given. CTA performed on 3/17 due to rapid desat on room air trial, no PE but continued pulmonary edema. Lasix 40mg  IV BID  continued on 3/17 as well. On 3/18, patient transitioned to 40mg  PO lasix, but still requiring O2 despite adequate urine output. On day of discharge, CXR performed with resolution of pulmonary edema, but patient required O2 at rest. Discharged home with home O2, etiology of O2 requirement likely OHS vs underlying lung disease.   Issues for Follow Up:  1. Consider sleep study as an outpatient, likely OSA vs combination of OSA and OHS given body habitus and desats overnight. Discharged with home O2 due to requirement of O2 at rest and inability to ambulate. 2. Prednisone taper: 40mg  for 5 days total (switch after 3/22), and 30mg  indefinitely with follow up with PCP. Patient reports that she self tapered her prednisone due to seeing a neurology note on her discharge paperwork to limited benefit. She took 30mg  x 1 week, then 20mg  x 1 week, and was taking 10mg  on day of admission. Likely adrenal insufficiency contributed to hypotension and sepsis picture.  3. DM - levemir decreased to 10U QHS. Typically required about 10U sliding scale while on stress dose steroids. Gave instructions for novolog 5U if CBG >200 with meals.  4. Home lisinopril resumed on discharge at 5mg  rather than home 10mg .  5. HLD seen on lipid panel, no statin started given existing trouble with muscle spasms and risk of worsening outweighs benefit.   Significant Procedures: none  Significant Labs and Imaging:   Recent Labs Lab 07/17/16 0922 07/18/16 0313 07/22/16 0235  WBC 10.5 8.1 11.7*  HGB 11.5* 11.1* 12.0  HCT 35.0* 34.2* 37.4  PLT 184 180 218    Recent Labs Lab 07/16/16 0527 07/16/16 1805 07/17/16 0922 07/18/16 0313 07/20/16 0309 07/22/16 0235  NA 139  --  142 139 143 139  K 3.8  --  4.2 4.5 3.8 3.9  CL 96*  --  105 103 96* 95*  CO2 31  --  29 29 37* 35*  GLUCOSE 226*  --  171* 210* 121* 116*  BUN 27*  --  15 13 15 19   CREATININE 0.98 0.93 0.75 0.76 0.87 0.88  CALCIUM 9.2  --  8.9 9.1 9.5 9.4  ALKPHOS 79   --   --   --   --   --   AST 36  --   --   --   --   --   ALT 24  --   --   --   --   --   ALBUMIN 3.0*  --   --   --   --   --    Lactic acid 1.95> 1.3 HIV nonreactive TSH WNL AM cortisol 13.6 Lipid panel total cholesterol 211, LDL 138  Results/Tests Pending at Time of Discharge: blood cultures  Discharge Medications:  Allergies as of 07/22/2016      Reactions   Metformin And Related Nausea Only   GI intolerance.      Medication List    STOP taking these medications   doxycycline 100 MG tablet Commonly known as:  VIBRA-TABS   metoCLOPramide 10 MG tablet Commonly known as:  REGLAN     TAKE these medications   acetaminophen 325 MG tablet Commonly known as:  TYLENOL Take 2 tablets (650 mg total) by mouth every 6 (six) hours as needed (mild pain, fever >100.4).   aspirin EC 325 MG tablet Take 325 mg by mouth daily.   calcium carbonate 500 MG chewable tablet Commonly known as:  TUMS - dosed in mg elemental calcium Chew 1 tablet by mouth as needed for indigestion or heartburn.   calcium-vitamin D 500-200 MG-UNIT tablet Commonly known as:  OSCAL WITH D Take 1 tablet by mouth daily.   CO Q 10 PO Take 1 tablet by mouth daily.   colchicine 0.6 MG tablet Take 0.6 mg by mouth daily as needed (gout).   collagenase ointment Commonly known as:  SANTYL Apply topically daily.   diazepam 5 MG tablet Commonly known as:  VALIUM TAKE 1 TABLET BY MOUTH EVERY 6 HOURS AS NEEDED FOR LEG SPASMS   dicyclomine 20 MG tablet Commonly known as:  BENTYL Take 1 tablet (20 mg total) by mouth 3 (three) times daily as needed for spasms (abdominal pain).   docusate sodium 100 MG capsule Commonly known as:  COLACE Take 1 capsule (100 mg total) by mouth 2 (two) times daily.   furosemide 40 MG tablet Commonly known as:  LASIX Take 1 tablet (40 mg total) by mouth daily. Start taking on:  07/23/2016 What changed:  See the new instructions.   gabapentin 300 MG capsule Commonly known  as:  NEURONTIN TAKE 1 CAPSULE BY MOUTH 3 TIMES DAILY.   glucose blood test strip Test blood sugar three times per day.   ibuprofen 400 MG tablet Commonly known as:  ADVIL,MOTRIN Take 1 tablet (400 mg total) by mouth every 6 (six) hours as needed for headache or moderate pain.   insulin aspart 100 UNIT/ML FlexPen Commonly known as:  NOVOLOG Inject 5 Units into the skin 3 (three) times daily with meals as needed for high blood sugar (if CBG > 200). What changed:  additional instructions   Insulin Detemir 100 UNIT/ML Pen Commonly known as:  LEVEMIR FLEXTOUCH Inject 10 Units into the skin daily at  10 pm. What changed:  how much to take   Insulin Pen Needle 31G X 8 MM Misc Use to inject insulin 4x/day   lisinopril 5 MG tablet Commonly known as:  PRINIVIL,ZESTRIL Take 1 tablet (5 mg total) by mouth daily. What changed:  See the new instructions.   Magnesium 250 MG Tabs Take 250 mg by mouth daily.   methocarbamol 750 MG tablet Commonly known as:  ROBAXIN TAKE 2 TABLETS (1,500 MG TOTAL) BY MOUTH 3 (THREE) TIMES DAILY. What changed:  how much to take  when to take this   MULTIVITAMIN PO Take 1 tablet by mouth daily.   OVER THE COUNTER MEDICATION Take 2 drops by mouth daily. Hemp Extract   oxybutynin 5 MG 24 hr tablet Commonly known as:  DITROPAN-XL Take 1 tablet (5 mg total) by mouth at bedtime.   polyethylene glycol packet Commonly known as:  MIRALAX / GLYCOLAX Take 17 g by mouth daily.   predniSONE 10 MG tablet Commonly known as:  DELTASONE Take 3 tablets (30 mg total) by mouth daily with breakfast. Take 40mg  3/20-22, then 30mg  every day. What changed:  additional instructions   sulfamethoxazole-trimethoprim 800-160 MG tablet Commonly known as:  BACTRIM DS,SEPTRA DS Take 1 tablet by mouth every 12 (twelve) hours.   traMADol 50 MG tablet Commonly known as:  ULTRAM TAKE 1 TABLET BY MOUTH 4 TIMES DAILY   trolamine salicylate 10 % cream Commonly known as:   ASPERCREME Apply 1 application topically as needed for muscle pain.   VITAMIN B-12 PO Take 1 tablet by mouth daily.   Vitamin D3 1000 units Caps Take 4,000 Units by mouth daily.            Durable Medical Equipment        Start     Ordered   07/22/16 0000  For home use only DME oxygen    Question Answer Comment  Mode or (Route) Nasal cannula   Liters per Minute 2   Frequency Continuous (stationary and portable oxygen unit needed)   Oxygen conserving device Yes   Oxygen delivery system Gas      07/22/16 1205      Discharge Instructions: Please refer to Patient Instructions section of EMR for full details.  Patient was counseled important signs and symptoms that should prompt return to medical care, changes in medications, dietary instructions, activity restrictions, and follow up appointments.   Follow-Up Appointments: Follow-up Information    Sanjuana Letters, MD. Go on 07/25/2016.   Specialty:  Family Medicine Why:  8:45am for hospital follow up Contact information: [address] [place] [address] [phone]           Garth Bigness, MD 07/22/2016, 12:10 PM PGY-1, [facility] Family Medicine

## 2016-07-17 NOTE — Care Management Note (Signed)
Case Management Note  Patient Details  Name: Barbara [name] MRN: [id number] Date of Birth: Sep 18, 1955  Subjective/Objective:  From home alone, w/ chair bound and also uses lift chair- (papraplegia), presents with sepsis, cellulitis, sob, chest pain, adrenal insuff, and LLQ pain.  She is active with [facility], PT, and aide, would like to resume these services. She states she has all the DME she needs at home.  Her friends or family members come by each day for 30 minutes, they assist her with her meals also.  She will also need ambulance transport home at dc.  She has  PCP,  [name], she has medication coverage.  NCM will cont to follow for dc needs.                  Action/Plan:   Expected Discharge Date:                  Expected Discharge Plan:  Home w Home Health Services  In-House Referral:     Discharge planning Services  CM Consult  Post Acute Care Choice:  Home Health, Resumption of Svcs/PTA Provider Choice offered to:  Patient  DME Arranged:    DME Agency:     HH Arranged:  RN, PT, Nurse's Aide HH Agency:  [facility]  Status of Service:  In process, will continue to follow  If discussed at Long Length of Stay Meetings, dates discussed:    Additional Comments:  Leone Haven, RN 07/17/2016, 6:51 PM

## 2016-07-17 NOTE — Evaluation (Signed)
Physical Therapy Evaluation Patient Details Name: Barbara [name] MRN: [id number] DOB: 21-Jan-1956 Today's Date: 07/17/2016   History of Present Illness  Pt admitted with severe sepsis due to L LE cellulitis. PMH:  MS, spinal stenosis, gout, R eye blindness, morbid obesity, DM.  Clinical Impression  Pt admitted with above diagnosis. Pt currently with functional limitations due to the deficits listed below (see PT Problem List). Pt very motivated to transfer surface to surface again but with gross weakness BLE's. Pt tolerated sitting EOB x15 mins with max to min A to perform reaching activities in various planes.  Pt will benefit from skilled PT to increase their independence and safety with mobility to allow discharge to the venue listed below.       Follow Up Recommendations Home health PT    Equipment Recommendations  None recommended by PT    Recommendations for Other Services       Precautions / Restrictions Precautions Precautions: Fall Restrictions Weight Bearing Restrictions: No      Mobility  Bed Mobility Overal bed mobility: Needs Assistance Bed Mobility: Supine to Sit;Sit to Supine     Supine to sit: +2 for physical assistance;Max assist Sit to supine: +2 for physical assistance;Max assist   General bed mobility comments: assist for LEs and trunk, heavy reliance on rail  Transfers                 General transfer comment: pt was transferring with stedy last time she was at hospital and SNF but has not done so in several months and was not ready for that yet today  Ambulation/Gait             General Gait Details: unable  Stairs            Wheelchair Mobility    Modified Rankin (Stroke Patients Only)       Balance Overall balance assessment: Needs assistance Sitting-balance support: Bilateral upper extremity supported;Feet supported Sitting balance-Leahy Scale: Poor Sitting balance - Comments: sat EOB with max to min assist x 15  minutes, worked on wide range reaching                                     Pertinent Vitals/Pain Pain Assessment: No/denies pain    Home Living Family/patient expects to be discharged to:: Private residence Living Arrangements: Alone Available Help at Discharge: Family;Neighbor;Available PRN/intermittently Type of Home: House Home Access: Other (comment) (hydraulic lift)     Home Layout: One level Home Equipment: Grab bars - toilet;Grab bars - tub/shower;Wheelchair - [employer] - 2 wheels;Tub bench;Other (comment);Wheelchair - power;Toilet riser;Adaptive equipment;Hospital bed Additional Comments: cannot get to bathroom    Prior Function Level of Independence: Needs assistance   Gait / Transfers Assistance Needed: hoyer lift transfers with family/neighbors come  ADL's / Homemaking Assistance Needed: pt empties her foley catheter, assist for ADL, meals and housekeeping  Comments: Has groceries delivered. Rarely leaves her home.     Hand Dominance   Dominant Hand: Left    Extremity/Trunk Assessment   Upper Extremity Assessment Upper Extremity Assessment: Defer to OT evaluation RUE Deficits / Details: 4+/5, pt reports rotator cuff tear    Lower Extremity Assessment Lower Extremity Assessment: RLE deficits/detail;LLE deficits/detail;Generalized weakness RLE Deficits / Details: very little AROM RLE, trace df/pf at ankle, 1/5 hip flex and knee ext RLE Sensation: decreased light touch;decreased proprioception RLE Coordination: decreased fine motor;decreased gross motor LLE  Deficits / Details: slightly stronger than RLE, ankle df/pf 2-/5, knee ext 2-/5, hip flex 1/5 LLE Sensation: decreased light touch;decreased proprioception LLE Coordination: decreased fine motor;decreased gross motor    Cervical / Trunk Assessment Cervical / Trunk Assessment: Other exceptions Cervical / Trunk Exceptions: truncal weakness, h/o stenosis, morbid obesity  Communication    Communication: No difficulties  Cognition Arousal/Alertness: Awake/alert Behavior During Therapy: WFL for tasks assessed/performed Overall Cognitive Status: Within Functional Limits for tasks assessed                      General Comments General comments (skin integrity, edema, etc.): pt has not transferred since 03/06/16 when she had last MS flare after starting metformin. was transferring at home before that    Exercises     Assessment/Plan    PT Assessment Patient needs continued PT services  PT Problem List Decreased strength;Decreased range of motion;Decreased activity tolerance;Decreased balance;Decreased mobility;Decreased coordination;Obesity       PT Treatment Interventions DME instruction;Functional mobility training;Therapeutic activities;Therapeutic exercise;Balance training;Patient/family education    PT Goals (Current goals can be found in the Care Plan section)  Acute Rehab PT Goals Patient Stated Goal: to be able to transfer herself PT Goal Formulation: With patient Time For Goal Achievement: 07/31/16 Potential to Achieve Goals: Fair    Frequency Min 3X/week   Barriers to discharge Decreased caregiver support lives alone but family and neighbors come in daily    Co-evaluation PT/OT/SLP Co-Evaluation/Treatment: Yes Reason for Co-Treatment: Complexity of the patient's impairments (multi-system involvement);For patient/therapist safety PT goals addressed during session: Mobility/safety with mobility;Balance OT goals addressed during session: Strengthening/ROM;ADL's and self-care       End of Session   Activity Tolerance: Patient tolerated treatment well Patient left: in bed;with call bell/phone within reach Nurse Communication: Mobility status;Need for lift equipment PT Visit Diagnosis: Muscle weakness (generalized) (M62.81);Other (comment) (paraplegia)         Time: 1610-9604 PT Time Calculation (min) (ACUTE ONLY): 53 min   Charges:   PT  Evaluation $PT Eval Moderate Complexity: 1 Procedure PT Treatments $Therapeutic Activity: 8-22 mins   PT G Codes:       Lyanne Co, PT  Acute Rehab Services  [phone]   Lawana Chambers Bren Borys 07/17/2016, 2:41 PM

## 2016-07-17 NOTE — Consult Note (Signed)
WOC Nurse wound consult note Reason for Consult: LLE cellulitis Wound type:partial thickness from ruptured blister Pressure Injury POA: N/A Measurement:6cm x 10cm x 0.1cm Wound bed:100% pink Drainage (amount, consistency, odor) moderate slightly yellow drainage Periwound: intact, but LE has cellulitis Dressing procedure/placement/frequency: I have provided nurses with orders for To LLE Cleanse ruptured blister area with NS, pat gently dry, cover with vaseline gauze, ABD, wrap with kerlix, adhere without placing tape on skin. Keep extremity elevated. Change daily and prn excess drainage. We will not follow, but will remain available to this patient, to nursing, and the medical and/or surgical teams.  Please re-consult if we need to assist further.    Barnett Hatter, RN-C, WTA-C Wound Treatment Associate

## 2016-07-17 NOTE — Plan of Care (Signed)
Problem: Fluid Volume: Goal: Hemodynamic stability will improve Outcome: Progressing No CVP monitoring. VS stabilizing. IVF NS infusing as ordered.

## 2016-07-17 NOTE — Progress Notes (Signed)
Family Medicine Teaching Service Daily Progress Note Intern Pager: [phone]  Patient name: Barbara [name] Medical record number: [id number] Date of birth: [birth year] Age: 61 y.o. Gender: female  Primary Care Provider: Sanjuana Letters, MD Consultants: none Code Status: DNR   Pt Overview and Major Events to Date:  3/13 admitted to FPTS for severe sepsis   Assessment and Plan: [name] is a 61 y.o. female presenting with severe sepsis, LLE cellulitis, dyspnea, chest pain, LLQ pain. PMH is significant for multiple sclerosis with spastic paraplegia, chronic prednisone use, HTN, T2 DM.  Severe Sepsis, most likely 2/2 LLE Cellulitis: Lactic acidosis resolved.  Leukocytosis of 17.6 on admission, no labs drawn yet this am. S/p 4L NS bolus in ED. Broadly treated with Vanc/Zosyn. LLE cellulitis appears most likely source. BP normalized after fluids, antibiotics, and stress dose steroids. - Continue Vanc/Zosyn pending speciation of urine culture, consider bactrim for both cellulitis and UTI if susceptible - Blood cultures pending - f/u UA and UCx: Klebsiella - Wound care consult for LLE wound - Continue IV fluid resuscitation - d/c IV hydrocortisone, start 50mg  QD prednisone  Dyspnea and Chest pain, improving: Denies chest pain. Required 4L [place] overnight, weaned to RA on my exam this morning. Trop 0.00 > 0.04> <0.03. Risk start labs - TSH WNL.  - risk stratify: lipids panel pending - Monitor on telemetry - O2 when necessary  Adrenal Insufficiency, Multiple Sclerosis with spastic paraplegia on chronic steroids: Patient with quick taper from 30mg  daily of prednisone to 10mg  daily of prednisone yesterday.  Followed by [facility] neurology. Was previously on amino modulator, but was unable to afford this after becoming disabled. - Continue Gabapentin 300mg  TID, Robaxin, Tramadol - Stress dose steroids - prednisone 50mg  QD - Will likely need a very slow taper of steroids if planned in the  future -PT/OT eval ordered, patient has been to SNF in December, would be amenable to returning if PT felt necessary  LLQ abd pain: CT abd/pelvis with moderate stool burden, hepatomegaly with hepatic steatosis, cholelithiasis.  Her pain is most likely related to constipation given location and CT findings. - Bowel regimen: Colace bid, Miralax daily - titrate to achieve 1 soft BM daily  HTN: BPs normal this morning.  - hold home lasix, lisinopril - Continue home aspirin  T2DM: Taking Levemir 20 units qhs and Novolog 10 units TID ac at home. 3/13 HgbA1c 6.6.  - Levemir 10 units qhs - titrate prn - hold home Novolog for now as patient may not be eating well - moderate SSI, used 7U overnight  FEN/GI: NS @ 198mL/hr, Carb/heart diet Prophylaxis: Lovenox  Disposition: continued inpatient admission of sepsis  Subjective:  [name] feels better this morning - she denies chest pain. Abdominal pain is mostly improved. Notes a history of constipation because she holds stools at home due to caregivers only coming at certain times of the day. Last BM yesterday.   Objective: Temp:  [97.6 F (36.4 C)-99.9 F (37.7 C)] 97.8 F (36.6 C) (03/14 0826) Pulse Rate:  [91-106] 97 (03/14 0826) Resp:  [11-23] 16 (03/14 0826) BP: (87-132)/(51-91) 106/76 (03/14 0826) SpO2:  [92 %-100 %] 97 % (03/14 0826) Weight:  [280 lb (127 kg)-335 lb 5.1 oz (152.1 kg)] 280 lb (127 kg) (03/14 0500) Physical Exam: General: obese female lying in bed in NAD. Cardiovascular: RRR, no murmur Respiratory: CTAB, good air movement, easy WOB Abdomen: SNTND, +BS  Extremities: bilateral 3+ pitting edema, large 10cm wound over left mid calf with ruptured blister  and surrounding erythema   Laboratory:  Recent Labs Lab 07/16/16 0519 07/16/16 0527 07/16/16 1805  WBC  --  17.6* 11.2*  HGB 12.2 12.5 11.4*  HCT 36.0 39.2 36.3  PLT  --  207 180    Recent Labs Lab 07/16/16 0519 07/16/16 0527 07/16/16 1805  NA 139  139  --   K 4.0 3.8  --   CL 96* 96*  --   CO2  --  31  --   BUN 30* 27*  --   CREATININE 1.00 0.98 0.93  CALCIUM  --  9.2  --   PROT  --  5.8*  --   BILITOT  --  0.7  --   ALKPHOS  --  79  --   ALT  --  24  --   AST  --  36  --   GLUCOSE 233* 226*  --     Imaging/Diagnostic Tests: No results found.  Garth Bigness, MD 07/17/2016, 9:27 AM PGY-1, [facility] FPTS Intern pager: [phone], text pages welcome

## 2016-07-17 NOTE — Progress Notes (Signed)
  Echocardiogram 2D Echocardiogram has been performed.  Barbara [name] 07/17/2016, 4:44 PM

## 2016-07-17 NOTE — Evaluation (Signed)
Occupational Therapy Evaluation and Discharge Patient Details Name: Barbara [name] MRN: [id number] DOB: Aug 13, 1955 Today's Date: 07/17/2016    History of Present Illness Pt admitted with severe sepsis due to L LE cellulitis. PMH:  MS, spinal stenosis, gout, R eye blindness, morbid obesity, DM.   Clinical Impression   Pt reports being dependent in ADL and mobility since November 2017. She has had therapy in a SNF and home health therapy with minimal progress toward increasing her mobility per her report. Pt can self feed, groom with set up and empty her foley catheter. Her family assists with all IADL and use a hoyer lift to transfer pt. Pt is highly motivated to become stronger and more independent. She demonstrates significant truncal and LE weakness with poor sitting balance. No further acute OT needs.    Follow Up Recommendations  No OT follow up    Equipment Recommendations  None recommended by OT    Recommendations for Other Services       Precautions / Restrictions Precautions Precautions: Fall Restrictions Weight Bearing Restrictions: No      Mobility Bed Mobility Overal bed mobility: Needs Assistance Bed Mobility: Supine to Sit;Sit to Supine     Supine to sit: +2 for physical assistance;Max assist Sit to supine: +2 for physical assistance;Max assist   General bed mobility comments: assist for LEs and trunk, heavy reliance on rail  Transfers                 General transfer comment: deferred    Balance Overall balance assessment: Needs assistance Sitting-balance support: Bilateral upper extremity supported;Feet supported Sitting balance-Leahy Scale: Poor Sitting balance - Comments: sat EOB with max to min assist x 15 minutes, worked on wide range reaching                                    ADL Overall ADL's : At baseline                                       General ADL Comments: Pt can self feed and perform grooming  with set up at bed level.      Vision Baseline Vision/History: Wears glasses (R eye blindness) Wears Glasses: At all times Patient Visual Report: No change from baseline       Perception     Praxis      Pertinent Vitals/Pain Pain Assessment: No/denies pain     Hand Dominance Left   Extremity/Trunk Assessment Upper Extremity Assessment Upper Extremity Assessment: RUE deficits/detail;LUE deficits/detail RUE Deficits / Details: 4+/5, pt reports rotator cuff tear   Lower Extremity Assessment Lower Extremity Assessment: Defer to PT evaluation   Cervical / Trunk Assessment Cervical / Trunk Assessment: Other exceptions (truncal weakness, hx of stenosis, morbid obesity)   Communication Communication Communication: No difficulties   Cognition Arousal/Alertness: Awake/alert Behavior During Therapy: WFL for tasks assessed/performed Overall Cognitive Status: Within Functional Limits for tasks assessed                     General Comments       Exercises       Shoulder Instructions      Home Living Family/patient expects to be discharged to:: Private residence Living Arrangements: Alone Available Help at Discharge: Family;Neighbor;Available PRN/intermittently Type of Home: House Home Access: Other (comment) (  hydrolic lift)     Home Layout: One level     Bathroom Shower/Tub: Tub/shower unit Shower/tub characteristics: Curtain Firefighter: Handicapped height Bathroom Accessibility:  (does not use her bathroom)   Home Equipment: Grab bars - toilet;Grab bars - tub/shower;Wheelchair - [employer] - 2 wheels;Tub bench;Other (comment);Wheelchair - power;Toilet riser;Adaptive equipment;Hospital bed (lift chair, hoyer lift) Adaptive Equipment: Reacher;Sock aid;Long-handled sponge Additional Comments: cannot get to bathroom      Prior Functioning/Environment Level of Independence: Needs assistance  Gait / Transfers Assistance Needed: hoyer lift transfers  with family/neighbors come ADL's / Homemaking Assistance Needed: pt empties her foley catheter, assist for ADL, meals and housekeeping   Comments: Has groceries delivered. Rarely leaves her home.        OT Problem List: Decreased strength;Impaired balance (sitting and/or standing);Decreased knowledge of use of DME or AE;Obesity      OT Treatment/Interventions:      OT Goals(Current goals can be found in the care plan section) Acute Rehab OT Goals Patient Stated Goal: to be able to transfer herself  OT Frequency:     Barriers to D/C:            Co-evaluation PT/OT/SLP Co-Evaluation/Treatment: Yes Reason for Co-Treatment: Complexity of the patient's impairments (multi-system involvement);For patient/therapist safety   OT goals addressed during session: Strengthening/ROM;ADL's and self-care      End of Session Nurse Communication: Mobility status  Activity Tolerance: Patient tolerated treatment well Patient left: in bed;with call bell/phone within reach  OT Visit Diagnosis: Muscle weakness (generalized) (M62.81)                ADL either performed or assessed with clinical judgement  Time: 1610-9604 OT Time Calculation (min): 52 min Charges:  OT General Charges $OT Visit: 1 Procedure OT Evaluation $OT Eval Moderate Complexity: 1 Procedure OT Treatments $Self Care/Home Management : 8-22 mins G-Codes:     Evern Bio 07/17/2016, 2:08 PM  [phone]

## 2016-07-17 NOTE — Progress Notes (Signed)
   Per patient, patient already has a HCPOA and does not desire to update.  Will follow, as needed.  - Rev. Chaplain Kipp Brood MDiv ThM

## 2016-07-18 DIAGNOSIS — N3000 Acute cystitis without hematuria: Secondary | ICD-10-CM

## 2016-07-18 LAB — GLUCOSE, CAPILLARY
GLUCOSE-CAPILLARY: 187 mg/dL — AB (ref 65–99)
Glucose-Capillary: 149 mg/dL — ABNORMAL HIGH (ref 65–99)
Glucose-Capillary: 172 mg/dL — ABNORMAL HIGH (ref 65–99)
Glucose-Capillary: 203 mg/dL — ABNORMAL HIGH (ref 65–99)

## 2016-07-18 LAB — BASIC METABOLIC PANEL
ANION GAP: 7 (ref 5–15)
BUN: 13 mg/dL (ref 6–20)
CALCIUM: 9.1 mg/dL (ref 8.9–10.3)
CO2: 29 mmol/L (ref 22–32)
CREATININE: 0.76 mg/dL (ref 0.44–1.00)
Chloride: 103 mmol/L (ref 101–111)
GFR calc Af Amer: >60 mL/min (ref >60)
GLUCOSE: 210 mg/dL — AB (ref 65–99)
Potassium: 4.5 mmol/L (ref 3.5–5.1)
Sodium: 139 mmol/L (ref 135–145)

## 2016-07-18 LAB — CBC
HCT: 34.2 % — ABNORMAL LOW (ref 36.0–46.0)
Hemoglobin: 11.1 g/dL — ABNORMAL LOW (ref 12.0–15.0)
MCH: 29.6 pg (ref 26.0–34.0)
MCHC: 32.5 g/dL (ref 30.0–36.0)
MCV: 91.2 fL (ref 78.0–100.0)
PLATELETS: 180 10*3/uL (ref 150–400)
RBC: 3.75 MIL/uL — ABNORMAL LOW (ref 3.87–5.11)
RDW: 17.3 % — ABNORMAL HIGH (ref 11.5–15.5)
WBC: 8.1 10*3/uL (ref 4.0–10.5)

## 2016-07-18 LAB — URINE CULTURE

## 2016-07-18 MED ORDER — DIAZEPAM 2 MG PO TABS: 2.0000 mg | ORAL_TABLET | Freq: Once | ORAL | Status: DC

## 2016-07-18 MED ORDER — FUROSEMIDE 40 MG PO TABS
20.0000 mg | ORAL_TABLET | Freq: Every day | ORAL | Status: DC
  Administered 2016-07-18 – 2016-07-21 (×4): 20 mg via ORAL
  Filled 2016-07-18 (×4): qty 1

## 2016-07-18 MED ORDER — SULFAMETHOXAZOLE-TRIMETHOPRIM 800-160 MG PO TABS
1.0000 | ORAL_TABLET | Freq: Two times a day (BID) | ORAL | Status: DC
  Administered 2016-07-18 – 2016-07-22 (×8): 1 via ORAL
  Filled 2016-07-18 (×9): qty 1

## 2016-07-18 MED ORDER — CYANOCOBALAMIN 500 MCG PO TABS
250.0000 ug | ORAL_TABLET | Freq: Every day | ORAL | Status: DC
  Administered 2016-07-18: 250 ug via ORAL
  Filled 2016-07-18 (×2): qty 1

## 2016-07-18 NOTE — Care Management Note (Signed)
Case Management Note  Patient Details  Name: Barbara [name] MRN: [id number] Date of Birth: 08/09/1955  Subjective/Objective:                    Action/Plan:  Plan discharge tomorrow. Received orders for home health, [name] with [facility] aware. Patient will need ambulance transport home.  Expected Discharge Date:                  Expected Discharge Plan:  Home w Home Health Services  In-House Referral:     Discharge planning Services  CM Consult  Post Acute Care Choice:  Home Health, Resumption of Svcs/PTA Provider Choice offered to:  Patient  DME Arranged:    DME Agency:     HH Arranged:  RN, PT, Nurse's Aide, OT HH Agency:  [facility]  Status of Service:  In process, will continue to follow  If discussed at Long Length of Stay Meetings, dates discussed:    Additional Comments:  Kingsley Plan, RN 07/18/2016, 1:54 PM

## 2016-07-18 NOTE — Progress Notes (Signed)
Family Medicine Teaching Service Daily Progress Note Intern Pager: [phone]  Patient name: Barbara [name] Medical record number: [id number] Date of birth: 1956/04/13 Age: 61 y.o. Gender: female  Primary Care Provider: Sanjuana Letters, MD Consultants: none Code Status: DNR   Pt Overview and Major Events to Date:  3/13 admitted to FPTS for severe sepsis   Assessment and Plan: Barbara [name] is a 61 y.o. female presenting with severe sepsis, LLE cellulitis, dyspnea, chest pain, LLQ pain. PMH is significant for multiple sclerosis with spastic paraplegia, chronic prednisone use, HTN, T2 DM.  Severe Sepsis, most likely 2/2 LLE Cellulitis: Lactic acidosis resolved.  Leukocytosis resolved. S/p 4L NS bolus in ED. Broadly treated with Vanc/Zosyn. LLE cellulitis vs UTI as possible source. - change to bactrim to cover both UTI and cellulitis 2 DS x 10 days total (end date 3/22) - Blood cultures NG x1 day - f/u UA and UCx: Klebsiella - Wound care consult for LLE wound - Continue IV fluid resuscitation - d/c IV hydrocortisone, start 50mg  QD prednisone  Dyspnea and Chest pain, improving: Denies chest pain. Required 2L [place] overnight, weaned to RA on my exam this morning. Trop 0.00 > 0.04> <0.03. Risk start labs - TSH WNL. Lipid panel with elevated LDL at 138 and total cholesterol 211.  - Monitor on telemetry - O2 when necessary, on RA on my exam this morning  Adrenal Insufficiency, Multiple Sclerosis with spastic paraplegia on chronic steroids:  - Continue Gabapentin 300mg  TID, Robaxin, Tramadol - Stress dose steroids - prednisone 50mg  QD - Will likely need a very slow taper of steroids if planned in the future -PT/OT eval ordered, recommended HHPT  LLQ abd pain: CT abd/pelvis with moderate stool burden, hepatomegaly with hepatic steatosis, cholelithiasis.  Her pain is most likely related to constipation given location and CT findings. - Bowel regimen: Colace bid, Miralax daily - titrate  to achieve 1 soft BM daily  HTN: BPs normal this morning.  - hold home lasix, lisinopril - Continue home aspirin  T2DM: Taking Levemir 20 units qhs and Novolog 10 units TID ac at home. 3/13 HgbA1c 6.6.  - Levemir 10 units qhs - titrate prn - hold home Novolog for now as patient may not be eating well - moderate SSI, used 7U overnight  FEN/GI: NS @ 143mL/hr, Carb/heart diet Prophylaxis: Lovenox  Disposition: continued inpatient admission of sepsis  Subjective:  Barbara [name] feels better this morning - she is very appreciative of our care. Ecstatic that she can go home with HHPT instead instead of SNF.   Objective: Temp:  [98 F (36.7 C)-98.7 F (37.1 C)] 98.3 F (36.8 C) (03/15 0700) Pulse Rate:  [85-103] 90 (03/15 0700) Resp:  [12-31] 17 (03/15 0700) BP: (93-133)/(63-92) 131/92 (03/15 0700) SpO2:  [91 %-94 %] 93 % (03/15 0700) Weight:  [279 lb 15.8 oz (127 kg)] 279 lb 15.8 oz (127 kg) (03/15 0500) Physical Exam: General: obese female lying in bed in NAD. Cardiovascular: RRR, no murmur Respiratory: CTAB, good air movement, easy WOB Abdomen: SNTND, +BS  Extremities: bilateral 3+ pitting edema, left calf blister covered by kerlix and tape.  Laboratory:  Recent Labs Lab 07/16/16 1805 07/17/16 0922 07/18/16 0313  WBC 11.2* 10.5 8.1  HGB 11.4* 11.5* 11.1*  HCT 36.3 35.0* 34.2*  PLT 180 184 180    Recent Labs Lab 07/16/16 0527 07/16/16 1805 07/17/16 0922 07/18/16 0313  NA 139  --  142 139  K 3.8  --  4.2 4.5  CL 96*  --  105 103  CO2 31  --  29 29  BUN 27*  --  15 13  CREATININE 0.98 0.93 0.75 0.76  CALCIUM 9.2  --  8.9 9.1  PROT 5.8*  --   --   --   BILITOT 0.7  --   --   --   ALKPHOS 79  --   --   --   ALT 24  --   --   --   AST 36  --   --   --   GLUCOSE 226*  --  171* 210*    Imaging/Diagnostic Tests: No results found.  Barbara Bigness, MD 07/18/2016, 9:14 AM PGY-1, [facility] FPTS Intern pager: [phone], text pages  welcome

## 2016-07-18 NOTE — Clinical Social Work Note (Signed)
CSW acknowledges SNF and transportation needs consults. PT recommending HHPT and patient will take PTAR home, per [facility].  CSW signing off. Consult again if any other social work needs arise.  Charlynn Court, CSW [phone]

## 2016-07-18 NOTE — Progress Notes (Signed)
Called by RN that patient requesting home valium for muscle spasms. Reviewed chart, this was recently rx'd by her PCP. Held appropriately on admission due to sepsis and hypotension. Given resolution of hypotension, will give one dose of 2mg , rather than home 5mg  now.

## 2016-07-19 ENCOUNTER — Inpatient Hospital Stay (HOSPITAL_COMMUNITY): Payer: PPO

## 2016-07-19 ENCOUNTER — Telehealth: Payer: Self-pay | Admitting: Family Medicine

## 2016-07-19 DIAGNOSIS — N3000 Acute cystitis without hematuria: Secondary | ICD-10-CM

## 2016-07-19 DIAGNOSIS — R0902 Hypoxemia: Secondary | ICD-10-CM

## 2016-07-19 LAB — GLUCOSE, CAPILLARY
GLUCOSE-CAPILLARY: 95 mg/dL (ref 65–99)
GLUCOSE-CAPILLARY: 189 mg/dL — AB (ref 65–99)
GLUCOSE-CAPILLARY: 212 mg/dL — AB (ref 65–99)
Glucose-Capillary: 182 mg/dL — ABNORMAL HIGH (ref 65–99)

## 2016-07-19 LAB — D-DIMER, QUANTITATIVE: D-Dimer, Quant: 0.89 ug/mL-FEU — ABNORMAL HIGH (ref 0.00–0.50)

## 2016-07-19 MED ORDER — LISINOPRIL 5 MG PO TABS: 10.0000 mg | ORAL_TABLET | Freq: Every day | ORAL | 1 refills | Status: DC

## 2016-07-19 MED ORDER — DOCUSATE SODIUM 100 MG PO CAPS: 100.0000 mg | ORAL_CAPSULE | Freq: Two times a day (BID) | ORAL | 0 refills | Status: DC

## 2016-07-19 MED ORDER — FUROSEMIDE 10 MG/ML IJ SOLN
40.0000 mg | Freq: Once | INTRAMUSCULAR | Status: AC
  Administered 2016-07-19: 40 mg via INTRAVENOUS
  Filled 2016-07-19: qty 4

## 2016-07-19 MED ORDER — PREDNISONE 10 MG PO TABS: 30.0000 mg | ORAL_TABLET | Freq: Every day | ORAL | 3 refills | Status: DC

## 2016-07-19 MED ORDER — VITAMIN B-12 1000 MCG PO TABS
1000.0000 ug | ORAL_TABLET | Freq: Every day | ORAL | Status: DC
  Administered 2016-07-19 – 2016-07-22 (×4): 1000 ug via ORAL
  Filled 2016-07-19 (×4): qty 1

## 2016-07-19 MED ORDER — INSULIN DETEMIR 100 UNIT/ML FLEXPEN: 10.0000 [IU] | PEN_INJECTOR | Freq: Every day | SUBCUTANEOUS | 11 refills | Status: DC

## 2016-07-19 MED ORDER — SULFAMETHOXAZOLE-TRIMETHOPRIM 800-160 MG PO TABS: 1.0000 | ORAL_TABLET | Freq: Two times a day (BID) | ORAL | 0 refills | Status: AC

## 2016-07-19 MED ORDER — POLYETHYLENE GLYCOL 3350 17 G PO PACK: 17.0000 g | PACK | Freq: Every day | ORAL | 0 refills | Status: DC

## 2016-07-19 MED FILL — POLYETHYLENE GLYCOL 3350: 15 days supply | Qty: 255 | Fill #0

## 2016-07-19 MED FILL — SULFAMETHOXAZOLE/TMP DS TAB: 800-160 | 6 days supply | Qty: 12 | Fill #0

## 2016-07-19 NOTE — Progress Notes (Signed)
Unable to obtain accurate recording of SpO2 - range from 78-98% within short periods of time.  Changed pulse oximetry location and used portable sensor with same results.  Pt placed on 2LO2, she is stating she is not short of breath and is alert and oriented.  Notified MD.  Will continue to monitor.

## 2016-07-19 NOTE — Progress Notes (Signed)
Portable SpO2 obtained by NT and pt at 91% while on 3L Nasal Cannula.  Will continue to monitor.

## 2016-07-19 NOTE — Telephone Encounter (Signed)
Noted, discussed and agree.

## 2016-07-19 NOTE — Plan of Care (Signed)
Problem: Activity: Goal: Risk for activity intolerance will decrease Outcome: Adequate for Discharge Pt bed bound at home

## 2016-07-19 NOTE — Progress Notes (Signed)
Transitions of Care Pharmacy Note  Plan:  Educated on prednisone Addressed concerns regarding taper schedule Recommend continuing oxybutynin at discharge --------------------------------------------- Barbara [name] is an 61 y.o. female who presents with a chief complaint of chest pain and SOB. In anticipation of discharge, pharmacy has reviewed this patient's prior to admission medication history, as well as current inpatient medications listed per the [facility].  Current medication indications, dosing, frequency, and notable side effects reviewed with patient. patient verbalized understanding of current inpatient medication regimen and is aware that the After Visit Summary when presented, will represent the most accurate medication list at discharge.   Barbara [name] did not express any concerns. She was counseled that her prednisone will need to be tapered slowly based on adrenal insuffiencey. At chart review, it was also noted that the oxybutynin was planned to be stopped at discharge. However, patient probably could still use the medication and family medicine team was contacted to consider continuing.   Assessment: Understanding of regimen: good Understanding of indications: good Potential of compliance: good Barriers to Obtaining Medications: No  Patient instructed to contact inpatient pharmacy team with further questions or concerns if needed.    Time spent preparing for discharge counseling: 15 Time spent counseling patient: 10   Thank you for allowing pharmacy to be a part of this patient's care.  Gwyndolyn Kaufman Bernette Redbird), PharmD  PGY1 Pharmacy Resident Pager: [phone] 07/19/2016 1:19 PM

## 2016-07-19 NOTE — Care Management Important Message (Signed)
Important Message  Patient Details  Name: Barbara [name] MRN: [id number] Date of Birth: 1955-05-31   Medicare Important Message Given:  Yes    Kyla Balzarine 07/19/2016, 3:48 PM

## 2016-07-19 NOTE — Progress Notes (Signed)
Patient was supposed to be discharged this morning, but she developed shortness of breath and possible O2 desaturations (not a good waveform). CXR and d-dimer were ordered. CXR with mild pulmonary edema and d-dimer mildly elevated at 0.89. She was given Lasix 40mg  IV x 1. Her wells score was 1.5 (for immobilization), so we did not proceed with a CTA chest because she was low risk. She was not tachycardic and she has also been receiving Lovenox for DVT prophylaxis throughout her hospitalization.   I re-evaluated this patient this evening. Patient states that she is feeling better now. She has had a few episodes of intermittent shortness of breath throughout the day, but they have only been mild. She does not know how much she has urinated because she has a foley, but she was told by her RN that it was a good amount. Patient still denying any chest pain, increased lower extremity edema, or calf pain.  Will give patient another dose of Lasix 40mg  IV x 1. Plan for discharge home tomorrow.  Willadean Carol, MD

## 2016-07-19 NOTE — Telephone Encounter (Signed)
Barbara [name] with [facility]: pharmacy doesn't have the packets. Would like verbal orders to change to the bottle.

## 2016-07-19 NOTE — Progress Notes (Signed)
Patient seen today. She is feeling better and in high spirit. She feels ready to go home. She denies any respiratory symptoms. She was placed on O2 [place] overnight although she remained asymptomatic.  Vitals:   07/18/16 2348 07/19/16 0354 07/19/16 0749 07/19/16 1201  BP: 113/89 118/78 (!) 155/108 (!) 154/108  Pulse: 84 76 86 90  Resp: 17 14 (!) 22 15  Temp: 98.2 F (36.8 C) 97.7 F (36.5 C) 97.9 F (36.6 C) 97.9 F (36.6 C)  TempSrc: Oral Oral Oral Oral  SpO2: 94% 97% 93% 90%  Weight:      Height:         Exam Gen: Obese, calm in bed, not in distress HEENT: EOMI, PERRLA. Resp: Air entry equal and CTA B/L. Heart: S1 S2 normal, no murmur. RRR. Abd: Soft, NT/ND, BS+ and normal Ext: Improved edema B/L about + plus. Reduced erythema of both Shin.  A/P: 1. Severe Sepsis secondary to UTI and cellulitis. Gradually resolving.     Great response to oral A/B.     Daily wound care and keep wound clean and dry.       2. Dyspnea with intermittent hypoxia: Likely OHS vs OSA.     As discussed with the patient she will benefit from sleep study as outpatient. Her PCP will set this up.     Discharge anticipated today however she again desaturated to 77% per nursing despite being asymptomatic.     Obtain repeat chest xray and D-dimer. If D-dimer is positive we will obtain CTA.     She might benefit from home O2.     We will hold off on discharge today.  3. Adrenal Insufficiency: Improved on oral steroid. We will give tapering dose upon D/C  4. DM2/HTN: I agree with current regimen. She is doing well on it.

## 2016-07-19 NOTE — Telephone Encounter (Signed)
Spoke with [name] and he did give the verbal order for the request for below. Routing to PCP as FYI. [name], [name], [place]

## 2016-07-19 NOTE — Care Management Note (Addendum)
Case Management Note  Patient Details  Name: Barbara [name] MRN: [id number] Date of Birth: 1956/02/05  Subjective/Objective:    From home alone, w/ chair bound and also uses lift chair- (papraplegia), presents with sepsis, cellulitis, sob, chest pain, adrenal insuff, and LLQ pain.  She is active with [facility] for [facility], PT, and aide, would like to resume these services. She states she has all the DME she needs at home.  Her friends or family members come by each day for 30 minutes, they assist her with her meals also.  She will also need ambulance transport home at dc.  She has  PCP,  [name], she has medication coverage.  NCM will cont to follow for dc needs  3/15 1041 Letha Cape RN, BSN - patient is for dc today via ambulance at 1 pm, her sister will be at her home when she gets there, address confirmed with patient, also NCM informed [facility] that patient needs stretcher that will sit up so she can get on the hydraulic lift at her home, they said they have that information.  NCM informed Vickie with [facility] that patient is being dc today. MD states that patient will have sleep study set up by them as outpatient.  Per RN, patient may need home oxygen, patient stated that if she needs home oxygen she would like [facility].  NCM made referral to [facility], he will keep a look out to see if patient will need home oxygen.  Per RN, MD is canceling her discharge.  NCM notified [facility] that discharge is canceled.  The ambulance form and face sheet and DNR form is on shadow chart.    3/19 1205 Letha Cape RN, BSN - per RN, NCM spoke with resident she states patient is for dc today, NCM informed them that the oxygen order needs to be in , we need to get oxygen set up at home before patient is dc.  NCM called Jermaine with [facility] to get this set up.  [facility] delivery will contact patient's sister [name] to get oxygen set up at her home.  [name] will call patient to let her know they are there and then patient will notify RN  and RN will call [facility], forms are on shadow chart with DNR form. Patient states her other sister will be there when she gets home this evening.                     Action/Plan:   Expected Discharge Date:  07/19/16               Expected Discharge Plan:  Home w Home Health Services  In-House Referral:     Discharge planning Services  CM Consult  Post Acute Care Choice:  Home Health, Resumption of Svcs/PTA Provider Choice offered to:  Patient  DME Arranged:    DME Agency:     HH Arranged:  RN, PT, Nurse's Aide, OT HH Agency:  [facility]  Status of Service:  Completed, signed off  If discussed at Long Length of Stay Meetings, dates discussed:    Additional Comments:  Leone Haven, RN 07/19/2016, 10:41 AM

## 2016-07-19 NOTE — Progress Notes (Addendum)
Portable SpO2 obtained - 92% on 3L Nasal Cannula.  SpO2 84% showing on monitor.  Pt still stating she does not feel short of breath and is A&O.  Will continue to monitor.

## 2016-07-20 ENCOUNTER — Encounter (HOSPITAL_COMMUNITY): Payer: Self-pay | Admitting: *Deleted

## 2016-07-20 ENCOUNTER — Inpatient Hospital Stay (HOSPITAL_COMMUNITY): Payer: PPO

## 2016-07-20 DIAGNOSIS — E669 Obesity, unspecified: Secondary | ICD-10-CM

## 2016-07-20 LAB — GLUCOSE, CAPILLARY
GLUCOSE-CAPILLARY: 277 mg/dL — AB (ref 65–99)
GLUCOSE-CAPILLARY: 150 mg/dL — AB (ref 65–99)
Glucose-Capillary: 78 mg/dL (ref 65–99)
Glucose-Capillary: 185 mg/dL — ABNORMAL HIGH (ref 65–99)

## 2016-07-20 LAB — BASIC METABOLIC PANEL
ANION GAP: 10 (ref 5–15)
BUN: 15 mg/dL (ref 6–20)
CHLORIDE: 96 mmol/L — AB (ref 101–111)
CO2: 37 mmol/L — ABNORMAL HIGH (ref 22–32)
Calcium: 9.5 mg/dL (ref 8.9–10.3)
Creatinine, Ser: 0.87 mg/dL (ref 0.44–1.00)
GFR calc Af Amer: >60 mL/min (ref >60)
GLUCOSE: 121 mg/dL — AB (ref 65–99)
POTASSIUM: 3.8 mmol/L (ref 3.5–5.1)
Sodium: 143 mmol/L (ref 135–145)

## 2016-07-20 MED ORDER — FUROSEMIDE 10 MG/ML IJ SOLN
40.0000 mg | Freq: Four times a day (QID) | INTRAMUSCULAR | Status: AC
Start: 2016-07-20 — End: 2016-07-20
  Administered 2016-07-20 (×2): 40 mg via INTRAVENOUS
  Filled 2016-07-20 (×2): qty 4

## 2016-07-20 MED ORDER — ENOXAPARIN SODIUM 80 MG/0.8ML ~~LOC~~ SOLN
0.5000 mg/kg | SUBCUTANEOUS | Status: DC
  Administered 2016-07-20 – 2016-07-21 (×2): 70 mg via SUBCUTANEOUS
  Filled 2016-07-20 (×3): qty 0.8

## 2016-07-20 MED ORDER — IOPAMIDOL (ISOVUE-370) INJECTION 76%
INTRAVENOUS | Status: AC
  Administered 2016-07-20: 100 mL
  Filled 2016-07-20: qty 100

## 2016-07-20 NOTE — Progress Notes (Signed)
Attempted to wean pt off O2- 97% on 2L via Nasal Cannula. Removed around 515 sats hovering between 88% and 90%. 0545 sats between 86-88%. 2L of oxygen reapplied. Will continue to monitor.

## 2016-07-20 NOTE — Progress Notes (Signed)
Family Medicine Teaching Service Daily Progress Note Intern Pager: [phone]  Patient name: Barbara [name] Medical record number: [id number] Date of birth: 24-Oct-1955 Age: 61 y.o. Gender: female  Primary Care Provider: Sanjuana Letters, MD Consultants: none Code Status: DNR   Pt Overview and Major Events to Date:  3/13 admitted to FPTS for severe sepsis   Assessment and Plan: [name] is a 61 y.o. female presenting with severe sepsis, LLE cellulitis, dyspnea, chest pain, LLQ pain. PMH is significant for multiple sclerosis with spastic paraplegia, chronic prednisone use, HTN, T2 DM.  Severe Sepsis (resolved), most likely 2/2 LLE Cellulitis: Lactic acidosis resolved.  Leukocytosis resolved. S/p 4L NS bolus in ED. LLE cellulitis vs UTI as possible source. -  bactrim to cover both UTI and cellulitis 2 DS x 10 days total (end date 3/22) - Blood cultures NG x2 day - f/u UA and UCx: Klebsiella - Wound care consult for LLE wound - Continue IV fluid resuscitation - d/c IV hydrocortisone, start 50mg  QD prednisone  Dyspnea and Chest pain, improving: Denies chest pain. Required 2L [place] overnight, weaned to RA on my exam this morning. Trop 0.00 > 0.04> <0.03. Risk start labs - TSH WNL. Lipid panel with elevated LDL at 138 and total cholesterol 211. D dimer mildly elevated 0.89 but well's score of 1.5 so low risk of PE. Did not obtain CTA due to low suspicion. CXR yesterday with pulmonary edema. S/p Lasix IV 40 mg x2.  - Monitor on telemetry - patient requiring 2L O2 [place], RN to attempt wean and document O2 saturations   Adrenal Insufficiency, Multiple Sclerosis with spastic paraplegia on chronic steroids:  - Continue Gabapentin 300mg  TID, Robaxin, Tramadol - Stress dose steroids - prednisone 50mg  QD - Will likely need a very slow taper of steroids if planned in the future -PT/OT eval ordered, recommended HHPT  LLQ abd pain: CT abd/pelvis with moderate stool burden, hepatomegaly with  hepatic steatosis, cholelithiasis.  Her pain is most likely related to constipation given location and CT findings. - Bowel regimen: Colace bid, Miralax daily - titrate to achieve 1 soft BM daily  HTN: BPs normal this morning.  - continue home lasix  - Continue home aspirin  T2DM: Taking Levemir 20 units qhs and Novolog 10 units TID ac at home. 3/13 HgbA1c 6.6.  - Levemir 10 units qhs - titrate prn - hold home Novolog for now as patient may not be eating well - moderate SSI  FEN/GI: NS @ 171mL/hr, Carb/heart diet Prophylaxis: Lovenox  Disposition: likely home today   Subjective:  Reports SOB is much improved today from yesterday and that she is able to speak in full sentences without any problems.   Objective: Temp:  [97.5 F (36.4 C)-98.4 F (36.9 C)] 98.1 F (36.7 C) (03/17 0803) Pulse Rate:  [72-90] 72 (03/17 0803) Resp:  [10-22] 10 (03/17 0803) BP: (120-154)/(66-111) 133/101 (03/17 0803) SpO2:  [90 %-99 %] 96 % (03/17 0803) Weight:  [318 lb (144.2 kg)] 318 lb (144.2 kg) (03/17 0401) Physical Exam: General: obese female lying in bed in NAD. Cardiovascular: RRR, no murmur Respiratory: CTAB, good air movement, easy WOB, speaks in full sentences  Abdomen: SNTND, +BS  Extremities: bilateral 2+ pitting edema, left calf blister covered by kerlix and tape.  Laboratory:  Recent Labs Lab 07/16/16 1805 07/17/16 0922 07/18/16 0313  WBC 11.2* 10.5 8.1  HGB 11.4* 11.5* 11.1*  HCT 36.3 35.0* 34.2*  PLT 180 184 180    Recent Labs Lab 07/16/16 0527  07/17/16 0922 07/18/16 0313 07/20/16 0309  NA 139  --  142 139 143  K 3.8  --  4.2 4.5 3.8  CL 96*  --  105 103 96*  CO2 31  --  29 29 37*  BUN 27*  --  15 13 15   CREATININE 0.98  < > 0.75 0.76 0.87  CALCIUM 9.2  --  8.9 9.1 9.5  PROT 5.8*  --   --   --   --   BILITOT 0.7  --   --   --   --   ALKPHOS 79  --   --   --   --   ALT 24  --   --   --   --   AST 36  --   --   --   --   GLUCOSE 226*  --  171* 210* 121*   < > = values in this interval not displayed.  Imaging/Diagnostic Tests: Dg Chest 2 View  Result Date: 07/19/2016 CLINICAL DATA:  Hypoxia today.  History of hypertension. EXAM: CHEST  2 VIEW COMPARISON:  07/16/2016 FINDINGS: Cardiac silhouette is mildly enlarged. There is central vascular congestion with lower lung zone interstitial and hazy airspace opacities. Small pleural effusions are noted on the lateral view. No pneumothorax. There stable changes from a previous anterior cervical spine fusion. No acute skeletal abnormality. IMPRESSION: 1. Cardiomegaly along with interstitial and hazy airspace opacities and small effusions. This this consistent with congestive heart failure with mild pulmonary edema. Electronically Signed   By: Amie Portland M.D.   On: 07/19/2016 14:18    Arvilla Market, DO 07/20/2016, 10:24 AM PGY-2, [facility] FPTS Intern pager: [phone], text pages welcome

## 2016-07-20 NOTE — Progress Notes (Signed)
Pt educated on incentive spirometer. Pt demonstrated correct use of incentive spirometer and achieved a 1000 with the initial breath and lower each breath after. Pt given goal of 1250 and to achieve it every breath. Pt verbalized that she will use IS every hour at least 10 times.

## 2016-07-20 NOTE — Progress Notes (Signed)
SATURATION QUALIFICATIONS: (This note is used to comply with regulatory documentation for home oxygen)  Patient Saturations on Room Air at Rest = 81%  Patient Saturations on Room Air while Ambulating =  Unable to Assess  Patient Saturations on  Liters of oxygen while Ambulating =  Unable to Assess  Please briefly explain why patient needs home oxygen: Removed the [place] from pts nose and gave 5 minutes. Pt dropped from 96% on 2 L [place] to 81% on room air within those 5 minutes. Pt remained awake during the whole 5 minutes and did not exert any energy.

## 2016-07-21 LAB — GLUCOSE, CAPILLARY
GLUCOSE-CAPILLARY: 234 mg/dL — AB (ref 65–99)
GLUCOSE-CAPILLARY: 205 mg/dL — AB (ref 65–99)
Glucose-Capillary: 95 mg/dL (ref 65–99)
Glucose-Capillary: 149 mg/dL — ABNORMAL HIGH (ref 65–99)

## 2016-07-21 LAB — CULTURE, BLOOD (ROUTINE X 2)
CULTURE: NO GROWTH
Culture: NO GROWTH

## 2016-07-21 MED ORDER — ENOXAPARIN SODIUM 80 MG/0.8ML ~~LOC~~ SOLN
0.5000 mg/kg | SUBCUTANEOUS | Status: DC
  Administered 2016-07-21 – 2016-07-22 (×2): 70 mg via SUBCUTANEOUS
  Filled 2016-07-21: qty 0.8

## 2016-07-21 MED ORDER — FUROSEMIDE 40 MG PO TABS
40.0000 mg | ORAL_TABLET | Freq: Every day | ORAL | Status: DC
  Administered 2016-07-22: 40 mg via ORAL
  Filled 2016-07-21: qty 1

## 2016-07-21 MED ORDER — FUROSEMIDE 40 MG PO TABS
20.0000 mg | ORAL_TABLET | Freq: Once | ORAL | Status: AC
Start: 2016-07-21 — End: 2016-07-21
  Administered 2016-07-21: 20 mg via ORAL

## 2016-07-21 MED ORDER — LISINOPRIL 5 MG PO TABS
5.0000 mg | ORAL_TABLET | Freq: Every day | ORAL | Status: DC
  Administered 2016-07-21 – 2016-07-22 (×2): 5 mg via ORAL
  Filled 2016-07-21 (×2): qty 1

## 2016-07-21 MED ORDER — PREDNISONE 20 MG PO TABS
40.0000 mg | ORAL_TABLET | Freq: Every day | ORAL | Status: DC
  Administered 2016-07-21 – 2016-07-22 (×2): 40 mg via ORAL
  Filled 2016-07-21 (×2): qty 2

## 2016-07-21 MED ORDER — PREDNISONE 20 MG PO TABS
40.0000 mg | ORAL_TABLET | Freq: Every day | ORAL | Status: DC
  Filled 2016-07-21: qty 2

## 2016-07-21 NOTE — Plan of Care (Signed)
Problem: Fluid Volume: Goal: Ability to maintain a balanced intake and output will improve Outcome: Progressing Edema in bilateral lower extremeties decreased from +3 to +2.

## 2016-07-21 NOTE — Progress Notes (Signed)
Family Medicine Teaching Service Daily Progress Note Intern Pager: [phone]  Patient name: Barbara [name] Medical record number: [id number] Date of birth: 1955/10/15 Age: 61 y.o. Gender: female  Primary Care Provider: Sanjuana Letters, MD Consultants: none Code Status: DNR   Pt Overview and Major Events to Date:  3/13 admitted to FPTS for severe sepsis   Assessment and Plan: Barbara [name] is a 61 y.o. female presenting with severe sepsis, LLE cellulitis, dyspnea, chest pain, LLQ pain. PMH is significant for multiple sclerosis with spastic paraplegia, chronic prednisone use, HTN, T2 DM.  Severe Sepsis (resolved), most likely 2/2 LLE Cellulitis: Lactic acidosis resolved.  Leukocytosis resolved. S/p 4L NS bolus in ED. LLE cellulitis vs UTI as possible source. Ucx with Klebsiella bactrim sensitive.  -  bactrim to cover both UTI and cellulitis 2 DS x 10 days total (end date 3/22) - Blood cultures NG x4 day - Wound care consult for LLE wound - prednisone decreased to 40mg  x 5 days, then 30mg  indefinitely.   Dyspnea and Chest pain, improving: D dimer mildly elevated 0.89 but well's score of 1.5 so low risk of PE. Did not obtain CTA due to low suspicion. CXR yesterday with pulmonary edema. S/p Lasix IV 40 mg x2. UOP more than adequate with lasix, LE edema decreased, but O2 requirement persistent, suspect OHS.  - Monitor on telemetry - patient requiring 2L [place] at rest, desat without. Not ambulatory at baseline.  - increase home lasix to 40mg  QD  Adrenal Insufficiency, Multiple Sclerosis with spastic paraplegia on chronic steroids:  - Continue Gabapentin 300mg  TID, Robaxin, Tramadol - Stress dose steroids - prednisone 40mg  QD - Will likely need a very slow taper of steroids if planned in the future, plan to discharge with slow taper to home dose of 30mg  -PT/OT eval ordered, recommended HHPT  HTN: BPs elevated today. - increased home lasix to 40mg  QD - restart home lisinopril at 5mg   rather than 10mg  - Continue home aspirin  T2DM: Taking Levemir 20 units qhs and Novolog 10 units TID ac at home. 3/13 HgbA1c 6.6.  - Levemir 10 units qhs - titrate prn - moderate SSI  FEN/GI: Carb/heart diet Prophylaxis: Lovenox  Disposition: home pending diuresis   Subjective:  Patient feels well this morning, reports she did not feel bad while desatting earlier in her course. Would like to go home, but does not want to go too soon.   Objective: Temp:  [98.3 F (36.8 C)-98.8 F (37.1 C)] 98.3 F (36.8 C) (03/18 0320) Pulse Rate:  [75-91] 75 (03/18 0320) Resp:  [10-22] 10 (03/18 0320) BP: (120-153)/(80-107) 136/95 (03/18 0320) SpO2:  [91 %-96 %] 95 % (03/18 0320) Weight:  [320 lb (145.2 kg)] 320 lb (145.2 kg) (03/18 0320) Physical Exam: General: obese female lying in bed in NAD. Cardiovascular: RRR, no murmur Respiratory: CTAB, good air movement, easy WOB, speaks in full sentences  Abdomen: SNTND, +BS  Extremities: bilateral 2+ pitting edema, left calf blister covered by kerlix and tape.  Laboratory:  Recent Labs Lab 07/16/16 1805 07/17/16 0922 07/18/16 0313  WBC 11.2* 10.5 8.1  HGB 11.4* 11.5* 11.1*  HCT 36.3 35.0* 34.2*  PLT 180 184 180    Recent Labs Lab 07/16/16 0527  07/17/16 0922 07/18/16 0313 07/20/16 0309  NA 139  --  142 139 143  K 3.8  --  4.2 4.5 3.8  CL 96*  --  105 103 96*  CO2 31  --  29 29 37*  BUN 27*  --  15 13 15   CREATININE 0.98  < > 0.75 0.76 0.87  CALCIUM 9.2  --  8.9 9.1 9.5  PROT 5.8*  --   --   --   --   BILITOT 0.7  --   --   --   --   ALKPHOS 79  --   --   --   --   ALT 24  --   --   --   --   AST 36  --   --   --   --   GLUCOSE 226*  --  171* 210* 121*  < > = values in this interval not displayed.  Imaging/Diagnostic Tests: Ct Angio Chest Pe W Or Wo Contrast  Result Date: 07/20/2016 CLINICAL DATA:  Multiple sclerosis. Sepsis. Hypoxemia. Shortness of breath. EXAM: CT ANGIOGRAPHY CHEST WITH CONTRAST TECHNIQUE:  Multidetector CT imaging of the chest was performed using the standard protocol during bolus administration of intravenous contrast. Multiplanar CT image reconstructions and MIPs were obtained to evaluate the vascular anatomy. CONTRAST:  100 cc Isovue 370 COMPARISON:  07/16/2016 FINDINGS: Cardiovascular: Pulmonary arterial opacification is good. No pulmonary emboli are seen. Mild aortic atherosclerosis without aneurysm or dissection. No coronary artery calcification is seen. No pericardial fluid. Heart size is normal. Mediastinum/Nodes: No mediastinal or hilar mass or lymphadenopathy. Extensive mediastinal fat. Lungs/Pleura: Bilateral effusions layering dependently. Dependent pulmonary atelectasis secondary to that. Nondependent lungs show areas of mild hazy density that could represent mild edema. Upper Abdomen: Negative Musculoskeletal: Chronic spinal degenerative changes. Review of the MIP images confirms the above findings. IMPRESSION: No pulmonary emboli. Bilateral pleural effusions layering dependently with dependent pulmonary atelectasis. Findings worsened since the study of 4 days ago. Electronically Signed   By: Paulina Fusi M.D.   On: 07/20/2016 15:34    Garth Bigness, MD 07/21/2016, 9:55 AM PGY-1, [facility] Family Medicine FPTS Intern pager: [phone], text pages welcome

## 2016-07-22 ENCOUNTER — Inpatient Hospital Stay (HOSPITAL_COMMUNITY): Payer: PPO

## 2016-07-22 DIAGNOSIS — R0602 Shortness of breath: Secondary | ICD-10-CM

## 2016-07-22 DIAGNOSIS — E119 Type 2 diabetes mellitus without complications: Secondary | ICD-10-CM

## 2016-07-22 LAB — BASIC METABOLIC PANEL
ANION GAP: 9 (ref 5–15)
BUN: 19 mg/dL (ref 6–20)
CHLORIDE: 95 mmol/L — AB (ref 101–111)
CO2: 35 mmol/L — ABNORMAL HIGH (ref 22–32)
Calcium: 9.4 mg/dL (ref 8.9–10.3)
Creatinine, Ser: 0.88 mg/dL (ref 0.44–1.00)
Glucose, Bld: 116 mg/dL — ABNORMAL HIGH (ref 65–99)
POTASSIUM: 3.9 mmol/L (ref 3.5–5.1)
SODIUM: 139 mmol/L (ref 135–145)

## 2016-07-22 LAB — GLUCOSE, CAPILLARY
GLUCOSE-CAPILLARY: 150 mg/dL — AB (ref 65–99)
Glucose-Capillary: 77 mg/dL (ref 65–99)
Glucose-Capillary: 172 mg/dL — ABNORMAL HIGH (ref 65–99)

## 2016-07-22 LAB — CBC
HCT: 37.4 % (ref 36.0–46.0)
HEMOGLOBIN: 12 g/dL (ref 12.0–15.0)
MCH: 29.7 pg (ref 26.0–34.0)
MCHC: 32.1 g/dL (ref 30.0–36.0)
MCV: 92.6 fL (ref 78.0–100.0)
PLATELETS: 218 10*3/uL (ref 150–400)
RBC: 4.04 MIL/uL (ref 3.87–5.11)
RDW: 17.8 % — ABNORMAL HIGH (ref 11.5–15.5)
WBC: 11.7 10*3/uL — AB (ref 4.0–10.5)

## 2016-07-22 MED ORDER — FLEET ENEMA 7-19 GM/118ML RE ENEM: 1.0000 | ENEMA | Freq: Once | RECTAL | Status: DC

## 2016-07-22 MED ORDER — FUROSEMIDE 40 MG PO TABS: 40.0000 mg | ORAL_TABLET | Freq: Every day | ORAL | 0 refills | Status: DC

## 2016-07-22 MED ORDER — LISINOPRIL 5 MG PO TABS: 5.0000 mg | ORAL_TABLET | Freq: Every day | ORAL | 1 refills | Status: DC

## 2016-07-22 MED ORDER — POLYETHYLENE GLYCOL 3350 17 G PO PACK
17.0000 g | PACK | Freq: Once | ORAL | Status: AC
  Administered 2016-07-22: 17 g via ORAL

## 2016-07-22 MED ORDER — PREDNISONE 10 MG PO TABS: 30.0000 mg | ORAL_TABLET | Freq: Every day | ORAL | 3 refills | Status: DC

## 2016-07-22 MED ORDER — INSULIN ASPART 100 UNIT/ML FLEXPEN: PEN_INJECTOR | SUBCUTANEOUS | 11 refills | Status: DC

## 2016-07-22 MED FILL — FUROSEMIDE 40 MG TABLET: 40 | 30 days supply | Qty: 30 | Fill #0

## 2016-07-22 MED FILL — LISINOPRIL 5 MG TABLET: 5 | 30 days supply | Qty: 30 | Fill #0

## 2016-07-22 NOTE — Consult Note (Signed)
   [facility] CM Inpatient Consult   07/22/2016  Barbara [name] 04-Nov-1955 [id number]     [name] is active with [facility] Care Management program. Chart reviewed and noted [name] will be discharging home today. Spoke with patient via phone who is agreeable to ongoing [facility] Care Management follow up. Spoke with inpatient RNCM who is aware [facility] Care Management is active. Made [facility] RNCM of likely discharge home today with home health services.    Raiford Noble, MSN-Ed, RN,BSN [facility] Liaison [phone]

## 2016-07-22 NOTE — Progress Notes (Signed)
Family Medicine Teaching Service Daily Progress Note Intern Pager: [phone]  Patient name: Barbara [name] Medical record number: [id number] Date of birth: [birth year] Age: 61 y.o. Gender: female  Primary Care Provider: Sanjuana Letters, MD Consultants: none Code Status: DNR   Pt Overview and Major Events to Date:  3/13 admitted to FPTS for severe sepsis   Assessment and Plan: Barbara [name] is a 61 y.o. female presenting with severe sepsis, LLE cellulitis, dyspnea, chest pain, LLQ pain. PMH is significant for multiple sclerosis with spastic paraplegia, chronic prednisone use, HTN, T2 DM.  Severe Sepsis (resolved), most likely 2/2 LLE Cellulitis: LLE cellulitis vs UTI as possible source. Ucx with Klebsiella bactrim sensitive. S/p vanc/zosyn on admission due to sepsis -  bactrim to cover both UTI and cellulitis 2 DS x 10 days total (end date 3/22) - Blood cultures NG and final - Wound care consult for LLE wound - prednisone decreased to 40mg  x 5 days, then 30mg  indefinitely.   Dyspnea and Chest pain, improving: CTA performed 3/17, no PE but bilateral pleural effusions. S/p Lasix IV 40 mg BID 3/16 and 3/17. 40mg  PO lasix given 3/19.  - Monitor on telemetry - weaned to RA on my exam this morning - continue 40mg  PO lasix QD - repeat 2 view CXR  Adrenal Insufficiency, Multiple Sclerosis with spastic paraplegia on chronic steroids:  - Continue Gabapentin 300mg  TID, Robaxin, Tramadol - Stress dose steroids - prednisone 40mg  QD - Will likely need a very slow taper of steroids if planned in the future, plan to discharge with slow taper to home dose of 30mg  -PT/OT eval ordered, recommended HHPT  HTN: BPs well controlled today.  - lasix 40mg  QD - lisinopril 5mg  QD - Continue home aspirin  T2DM: Taking Levemir 20 units qhs and Novolog 10 units TID ac at home. 3/13 HgbA1c 6.6.  - Levemir 10 units qhs - titrate prn - moderate SSI  FEN/GI: Carb/heart diet Prophylaxis:  Lovenox  Disposition: home pending diuresis   Subjective:  Patient feels well this morning, excited for discharge when appropriate. Weaned to RA on my exam this morning. No SOB on RA.   Objective: Temp:  [97.8 F (36.6 C)-98.9 F (37.2 C)] 98.2 F (36.8 C) (03/19 0804) Pulse Rate:  [64-85] 64 (03/19 0804) Resp:  [10-14] 13 (03/19 0804) BP: (102-128)/(68-86) 118/84 (03/19 0804) SpO2:  [91 %-96 %] 96 % (03/19 0804) Physical Exam: General: obese female lying in bed in NAD. Cardiovascular: RRR, no murmur Respiratory: Mild crackles in bilateral bases, lung exam limited by body habitus and limited mobility Abdomen: SNTND, +BS  Extremities: bilateral 2+ pitting edema, left calf blister covered by kerlix and tape.  Laboratory:  Recent Labs Lab 07/17/16 0922 07/18/16 0313 07/22/16 0235  WBC 10.5 8.1 11.7*  HGB 11.5* 11.1* 12.0  HCT 35.0* 34.2* 37.4  PLT 184 180 218    Recent Labs Lab 07/16/16 0527  07/18/16 0313 07/20/16 0309 07/22/16 0235  NA 139  < > 139 143 139  K 3.8  < > 4.5 3.8 3.9  CL 96*  < > 103 96* 95*  CO2 31  < > 29 37* 35*  BUN 27*  < > 13 15 19   CREATININE 0.98  < > 0.76 0.87 0.88  CALCIUM 9.2  < > 9.1 9.5 9.4  PROT 5.8*  --   --   --   --   BILITOT 0.7  --   --   --   --   ALKPHOS 79  --   --   --   --  ALT 24  --   --   --   --   AST 36  --   --   --   --   GLUCOSE 226*  < > 210* 121* 116*  < > = values in this interval not displayed.  Imaging/Diagnostic Tests: No results found.  Garth Bigness, MD 07/22/2016, 9:45 AM PGY-1, [facility] Family Medicine FPTS Intern pager: [phone], text pages welcome

## 2016-07-22 NOTE — Progress Notes (Signed)
SATURATION QUALIFICATIONS: (This note is used to comply with regulatory documentation for home oxygen)  Patient Saturations on Room Air at Rest = 79%  Patient Saturations on Room Air while Ambulating = Unable to assess   Patient Saturations on Liters of oxygen while Ambulating = Unable to assess   Please briefly explain why patient needs home oxygen: Removed the [place] from pts nose, pt began to desaturate after an hour on room air from 91% on 1LNC to 79% on room air

## 2016-07-22 NOTE — Progress Notes (Signed)
Physical Therapy Treatment Patient Details Name: Barbara [name] MRN: [id number] DOB: [birth year] Today's Date: 07/22/2016    History of Present Illness Pt admitted with severe sepsis due to L LE cellulitis. PMH:  MS, spinal stenosis, gout, R eye blindness, morbid obesity, DM.    PT Comments    Met with patient to perform bed mobility and review options for transfers. At this time, patient desires to regain ability to perform transfers but is very frustrated that she has been unable to progress. Spoke with patient regarding recommendations and encouraged increased assist at home. Highly recommend HHPT with focus on transfer training.   Follow Up Recommendations  Home health PT     Equipment Recommendations  3in1 (PT) (drop arm bariatric commode)    Recommendations for Other Services       Precautions / Restrictions Precautions Precautions: Fall Precaution Comments: watch O2 saturations (required 2 liters to maintain 88% Restrictions Weight Bearing Restrictions: No    Mobility  Bed Mobility Overal bed mobility: Needs Assistance Bed Mobility: Supine to Sit;Sit to Supine;Rolling   Rolling:+2 for physical assistance;Max assist Supine to sit: +2 for physical assistance;Max assist Sit to supine: +2 for physical assistance;Max assist   General bed mobility comments: assist for LEs and trunk, heavy reliance on rail  Transfers                 General transfer comment: unable to perfrom today due to enima and BM  Ambulation/Gait                 Stairs            Wheelchair Mobility    Modified Rankin (Stroke Patients Only)       Balance                                    Cognition Arousal/Alertness: Awake/alert Behavior During Therapy: WFL for tasks assessed/performed Overall Cognitive Status: Within Functional Limits for tasks assessed                      Exercises      General Comments General comments (skin  integrity, edema, etc.): discussed potential options for transfers at home, patient really desires to gain some independence with transfers       Pertinent Vitals/Pain Pain Assessment: No/denies pain    Home Living                      Prior Function            PT Goals (current goals can now be found in the care plan section) Acute Rehab PT Goals PT Goal Formulation: With patient Time For Goal Achievement: 07/31/16 Potential to Achieve Goals: Fair Progress towards PT goals: Progressing toward goals    Frequency    Min 3X/week      PT Plan      Co-evaluation             End of Session   Activity Tolerance: Patient tolerated treatment well Patient left: in bed;with call bell/phone within reach Nurse Communication: Mobility status;Need for lift equipment PT Visit Diagnosis: Muscle weakness (generalized) (M62.81)     Time: 4132-4401 PT Time Calculation (min) (ACUTE ONLY): 22 min  Charges:  $Therapeutic Activity: 8-22 mins                    G  Codes:       Duncan Dull 07/22/2016, 5:13 PM Alben Deeds, [place] DPT  [phone]

## 2016-07-22 NOTE — Care Management Important Message (Signed)
Important Message  Patient Details  Name: Barbara [name] MRN: [id number] Date of Birth: 07/10/1955   Medicare Important Message Given:  Yes    Kyla Balzarine 07/22/2016, 12:46 PM

## 2016-07-23 ENCOUNTER — Other Ambulatory Visit: Payer: Self-pay | Admitting: *Deleted

## 2016-07-23 NOTE — Patient Outreach (Signed)
[facility]) Care Management  07/23/2016  Susan Dusel 12/25/1955 [id number]   Transition of care call   Admitted 07/16/16 Diagnosis : Sepsis likely UTI Discharged 07/22/16   Chronic Conditions : MS, LLE cellulitis   Placed call to patient explained reason for call and transition of care weekly follow up reviewed, patient  reports she feels fine. Patient discussed she got settled at home about 11 pm, patient reports she has oxygen at 2 liters [facility].  Patient discussed that home health RN from [facility] has visited on today and changed dressings to legs.  Patient states her blood sugar is 173 on today and did not require novolog insulin at that time  per instructions. Patient reports she understands the new changes in her medication of insulin doses, Prednisone doses for 3/20 -22, then back to 30 mg, as well as bactrim daily until 3/22  and other changes in medications.   Patient discussed she cancelled her scheduled appointment with Dr.Hensel due to needing to arrange transportation with [facility] to [facility], as they have specific days they provide transportation to [place],  I offered assistance with arranging transportation to PCP visit , patient declines stating she would arrange it. Discussed with patient [facility] pharmacist [name]  and LCSW Reece Levy are both consulted to provide assistance as needed .  Patient agreeable to follow up on next week as part of transition of care program.   Plan  Will plan weekly telephone outreach in the next week as part of the transition of care program. Will continue collaboration with [facility] pharmacist and Social worker for patient needs. Will send PCP initial transition of care note.     Egbert Garibaldi, RN, [facility], [facility], [facility] Care Management [phone]- Mobile [phone]- Toll Free Main Office

## 2016-07-25 ENCOUNTER — Other Ambulatory Visit: Payer: Self-pay | Admitting: Family Medicine

## 2016-07-25 ENCOUNTER — Inpatient Hospital Stay: Payer: PPO | Admitting: Family Medicine

## 2016-07-25 MED FILL — ONE TOUCH ULTRA TEST STRIPS: 50 days supply | Qty: 100 | Fill #1

## 2016-07-26 ENCOUNTER — Telehealth: Payer: Self-pay | Admitting: Family Medicine

## 2016-07-26 ENCOUNTER — Telehealth: Payer: Self-pay | Admitting: *Deleted

## 2016-07-26 ENCOUNTER — Encounter: Payer: Self-pay | Admitting: Family Medicine

## 2016-07-26 DIAGNOSIS — I10 Essential (primary) hypertension: Secondary | ICD-10-CM | POA: Diagnosis not present

## 2016-07-26 DIAGNOSIS — G35 Multiple sclerosis: Secondary | ICD-10-CM | POA: Diagnosis not present

## 2016-07-26 DIAGNOSIS — E119 Type 2 diabetes mellitus without complications: Secondary | ICD-10-CM | POA: Diagnosis not present

## 2016-07-26 DIAGNOSIS — Z794 Long term (current) use of insulin: Secondary | ICD-10-CM | POA: Diagnosis not present

## 2016-07-26 DIAGNOSIS — M4802 Spinal stenosis, cervical region: Secondary | ICD-10-CM | POA: Diagnosis not present

## 2016-07-26 DIAGNOSIS — M48061 Spinal stenosis, lumbar region without neurogenic claudication: Secondary | ICD-10-CM | POA: Diagnosis not present

## 2016-07-26 DIAGNOSIS — Z7982 Long term (current) use of aspirin: Secondary | ICD-10-CM | POA: Diagnosis not present

## 2016-07-26 DIAGNOSIS — M75101 Unspecified rotator cuff tear or rupture of right shoulder, not specified as traumatic: Secondary | ICD-10-CM | POA: Diagnosis not present

## 2016-07-26 DIAGNOSIS — M6281 Muscle weakness (generalized): Secondary | ICD-10-CM | POA: Diagnosis not present

## 2016-07-26 NOTE — Telephone Encounter (Signed)
[name], RN with [facility] called to request verbal order for wound care. Patient has a Stage 2 blister to left calf: request to clean with normal saline, pat dry, apply a form of petroleum jelly  and wrap with Unna's Boot compression.  Verbal order given as stated by [name].  [place] Pu, RN

## 2016-07-26 NOTE — Telephone Encounter (Signed)
Barbara [name] needs verbal orders for PT twice a week for four weeks and OT evaluation and treat. ep

## 2016-07-29 DIAGNOSIS — Z794 Long term (current) use of insulin: Secondary | ICD-10-CM | POA: Diagnosis not present

## 2016-07-29 DIAGNOSIS — M48061 Spinal stenosis, lumbar region without neurogenic claudication: Secondary | ICD-10-CM | POA: Diagnosis not present

## 2016-07-29 DIAGNOSIS — I10 Essential (primary) hypertension: Secondary | ICD-10-CM | POA: Diagnosis not present

## 2016-07-29 DIAGNOSIS — Z7982 Long term (current) use of aspirin: Secondary | ICD-10-CM | POA: Diagnosis not present

## 2016-07-29 DIAGNOSIS — M4802 Spinal stenosis, cervical region: Secondary | ICD-10-CM | POA: Diagnosis not present

## 2016-07-29 DIAGNOSIS — R531 Weakness: Secondary | ICD-10-CM | POA: Diagnosis not present

## 2016-07-29 DIAGNOSIS — M6281 Muscle weakness (generalized): Secondary | ICD-10-CM | POA: Diagnosis not present

## 2016-07-29 DIAGNOSIS — M75101 Unspecified rotator cuff tear or rupture of right shoulder, not specified as traumatic: Secondary | ICD-10-CM | POA: Diagnosis not present

## 2016-07-29 DIAGNOSIS — E119 Type 2 diabetes mellitus without complications: Secondary | ICD-10-CM | POA: Diagnosis not present

## 2016-07-29 DIAGNOSIS — G35 Multiple sclerosis: Secondary | ICD-10-CM | POA: Diagnosis not present

## 2016-07-29 NOTE — Telephone Encounter (Signed)
Orders as requested left on Barbara [name]'s voice mail.

## 2016-07-30 ENCOUNTER — Telehealth: Payer: Self-pay | Admitting: Family Medicine

## 2016-07-30 DIAGNOSIS — I10 Essential (primary) hypertension: Secondary | ICD-10-CM | POA: Diagnosis not present

## 2016-07-30 DIAGNOSIS — G35 Multiple sclerosis: Secondary | ICD-10-CM | POA: Diagnosis not present

## 2016-07-30 DIAGNOSIS — M48061 Spinal stenosis, lumbar region without neurogenic claudication: Secondary | ICD-10-CM | POA: Diagnosis not present

## 2016-07-30 DIAGNOSIS — Z794 Long term (current) use of insulin: Secondary | ICD-10-CM | POA: Diagnosis not present

## 2016-07-30 DIAGNOSIS — M75101 Unspecified rotator cuff tear or rupture of right shoulder, not specified as traumatic: Secondary | ICD-10-CM | POA: Diagnosis not present

## 2016-07-30 DIAGNOSIS — Z7982 Long term (current) use of aspirin: Secondary | ICD-10-CM | POA: Diagnosis not present

## 2016-07-30 DIAGNOSIS — M4802 Spinal stenosis, cervical region: Secondary | ICD-10-CM | POA: Diagnosis not present

## 2016-07-30 DIAGNOSIS — E119 Type 2 diabetes mellitus without complications: Secondary | ICD-10-CM | POA: Diagnosis not present

## 2016-07-30 DIAGNOSIS — M6281 Muscle weakness (generalized): Secondary | ICD-10-CM | POA: Diagnosis not present

## 2016-07-30 NOTE — Telephone Encounter (Signed)
VO given as requested

## 2016-07-30 NOTE — Telephone Encounter (Signed)
Would like verbal orders for OT twice a week for four weeks. ep

## 2016-07-31 ENCOUNTER — Other Ambulatory Visit: Payer: Self-pay | Admitting: *Deleted

## 2016-07-31 NOTE — Patient Outreach (Addendum)
[facility]) Care Management  07/31/2016  Barbara [name] 1955-08-24 [id number]  Transition of care call  Placed call to patient no answer able to leave a hipaa compliant message requesting a return call .  Plan Will await return call, If no response will place outreach call in the next week as part of transition of care follow up.   1215  Return call from patient reports she is doing fine. Patient discussed ,home health RN and PT visit. Patient continues to wear oxygen , no new complaints of shortness of breath or pain. Foley catheter in place without new concerns.  Patient agreeable to home visit in the next week.  Plan will schedule home visit in the next week.     Egbert Garibaldi, RN, [facility] Care Management [phone]- Mobile [phone]- Toll Free Main Office

## 2016-08-03 ENCOUNTER — Telehealth: Payer: Self-pay | Admitting: Internal Medicine

## 2016-08-03 NOTE — Telephone Encounter (Signed)
**  After Hours/ Emergency Line Call*  Received a call to report that Barbara [name] has been having diarrhea. She has had several episodes of diarrhea per day. She feels like it is getting worse and she is starting to have some bowel incontinence. She has been taking OTC anti-diarrheal medications, which haven't been helping. She has also taking probiotics, which also haven't helped. She also started taking yogurt today. She states she is drinking plenty of liquids. She denies vomiting. She states she has stopped taking her stool softeners. Think this is likely related to her recent antibiotics.  We discussed the importance of staying well hydrated. We also discussed red flags and reasons to come to the ED, including severe abdominal pain, bloody stool, or vomiting. She states she will call on Monday to schedule an appointment if her diarrhea has not gotten better by then.  Will forward to PCP.  Hilton Sinclair, MD PGY-2, [facility] Family Medicine Residency

## 2016-08-05 ENCOUNTER — Other Ambulatory Visit: Payer: Self-pay | Admitting: Pharmacist

## 2016-08-05 DIAGNOSIS — M48061 Spinal stenosis, lumbar region without neurogenic claudication: Secondary | ICD-10-CM | POA: Diagnosis not present

## 2016-08-05 DIAGNOSIS — M75101 Unspecified rotator cuff tear or rupture of right shoulder, not specified as traumatic: Secondary | ICD-10-CM | POA: Diagnosis not present

## 2016-08-05 DIAGNOSIS — M4802 Spinal stenosis, cervical region: Secondary | ICD-10-CM | POA: Diagnosis not present

## 2016-08-05 DIAGNOSIS — E119 Type 2 diabetes mellitus without complications: Secondary | ICD-10-CM | POA: Diagnosis not present

## 2016-08-05 DIAGNOSIS — G35 Multiple sclerosis: Secondary | ICD-10-CM | POA: Diagnosis not present

## 2016-08-05 DIAGNOSIS — I10 Essential (primary) hypertension: Secondary | ICD-10-CM | POA: Diagnosis not present

## 2016-08-05 DIAGNOSIS — M6281 Muscle weakness (generalized): Secondary | ICD-10-CM | POA: Diagnosis not present

## 2016-08-05 DIAGNOSIS — Z7982 Long term (current) use of aspirin: Secondary | ICD-10-CM | POA: Diagnosis not present

## 2016-08-05 DIAGNOSIS — Z794 Long term (current) use of insulin: Secondary | ICD-10-CM | POA: Diagnosis not present

## 2016-08-05 NOTE — Patient Outreach (Signed)
Call to follow up with Ms. Wykoff about her extra help application and current out of pocket expense for the year for her patient assistance application. Left a HIPAA compliant message on the patient's voicemail. If have not heard from patient by 08/09/16, will give her another call at that time.  [name], PharmD, [facility] Clinical Pharmacist [facility] Care Management [phone]

## 2016-08-06 ENCOUNTER — Other Ambulatory Visit: Payer: Self-pay | Admitting: *Deleted

## 2016-08-06 ENCOUNTER — Encounter: Payer: Self-pay | Admitting: *Deleted

## 2016-08-06 DIAGNOSIS — E119 Type 2 diabetes mellitus without complications: Secondary | ICD-10-CM | POA: Diagnosis not present

## 2016-08-06 DIAGNOSIS — M75101 Unspecified rotator cuff tear or rupture of right shoulder, not specified as traumatic: Secondary | ICD-10-CM | POA: Diagnosis not present

## 2016-08-06 DIAGNOSIS — M4802 Spinal stenosis, cervical region: Secondary | ICD-10-CM | POA: Diagnosis not present

## 2016-08-06 DIAGNOSIS — M6281 Muscle weakness (generalized): Secondary | ICD-10-CM | POA: Diagnosis not present

## 2016-08-06 DIAGNOSIS — Z794 Long term (current) use of insulin: Secondary | ICD-10-CM | POA: Diagnosis not present

## 2016-08-06 DIAGNOSIS — I10 Essential (primary) hypertension: Secondary | ICD-10-CM | POA: Diagnosis not present

## 2016-08-06 DIAGNOSIS — Z7982 Long term (current) use of aspirin: Secondary | ICD-10-CM | POA: Diagnosis not present

## 2016-08-06 DIAGNOSIS — M48061 Spinal stenosis, lumbar region without neurogenic claudication: Secondary | ICD-10-CM | POA: Diagnosis not present

## 2016-08-06 DIAGNOSIS — G35 Multiple sclerosis: Secondary | ICD-10-CM | POA: Diagnosis not present

## 2016-08-06 NOTE — Patient Outreach (Addendum)
[facility]) Care Management   08/06/2016  Barbara [name] 02-Dec-1955 [id number]  Barbara [name] is an 61 y.o. female  Subjective:  Patient discussed she is doing pretty good on today. Patient voiced frustration with how  the system works regarding limited services available if you have worked hard during you life and own a home and other resources.   Patient discussed Home health RN has visited today and changed UNNA boot dressing and physical therapist scheduled for today also .  Patient reports she had recent problem with diarrhea but it has cleared now, but she is still not taking stool softeners yet.  Patient reports wearing oxygen at 2 liters at night only.  Patient discussed tapering her prednisone dose down to 20 mg as MD recommended.   Patient discussed that she has not experienced low blood sugar episode.  Patient discussed she plans to communicate with PCP by email and phone and at this time is not planning a follow up visit at office. Patient discussed that her neurology visit is usually  due  in June and she  may attend,will need to arrange transportation .   Objective:  BP 132/78 (BP Location: Right Arm, Patient Position: Sitting, Cuff Size: Large)   Pulse 88   Resp 18   LMP 10/07/2010   SpO2 92%   Patient resting in recliner chair in living room . Patient now has a setup connected to door knob  and reaches to her chair that allows her to be able to open door and  let her dog in and out.  Review of Systems  Constitutional: Negative.   HENT: Negative.   Eyes: Negative.   Respiratory: Negative.  Negative for shortness of breath and wheezing.   Cardiovascular: Negative for chest pain.  Gastrointestinal: Negative.  Negative for diarrhea.       Resolved per patient report  Genitourinary: Negative for flank pain and hematuria.       Foley cathether  Musculoskeletal: Positive for back pain.       Lower extremity weakness   Skin: Negative.    Neurological:       MS, paraplegic  Endo/Heme/Allergies: Negative.   Psychiatric/Behavioral: Negative.     Physical Exam  Constitutional: She is oriented to person, place, and time. She appears well-developed and well-nourished.  Cardiovascular: Normal rate, regular rhythm and normal heart sounds.   Respiratory: Effort normal.  GI: Soft.  Musculoskeletal:  Spastic paraplegic   Neurological: She is alert and oriented to person, place, and time.  Skin: Skin is warm and dry.  Unna boot wraps to bilateral lower legs   Psychiatric: She has a normal mood and affect. Her behavior is normal. Judgment and thought content normal.    Encounter Medications:   Outpatient Encounter Prescriptions as of 08/06/2016  Medication Sig  . acetaminophen (TYLENOL) 325 MG tablet Take 2 tablets (650 mg total) by mouth every 6 (six) hours as needed (mild pain, fever >100.4).  Marland Kitchen aspirin EC 325 MG tablet Take 325 mg by mouth daily.  . calcium carbonate (TUMS - DOSED IN MG ELEMENTAL CALCIUM) 500 MG chewable tablet Chew 1 tablet by mouth as needed for indigestion or heartburn.  . calcium-vitamin D (OSCAL WITH D) 500-200 MG-UNIT per tablet Take 1 tablet by mouth daily.    . Cholecalciferol (VITAMIN D3) 1000 units CAPS Take 4,000 Units by mouth daily.  . Cyanocobalamin (VITAMIN B-12 PO) Take 1 tablet by mouth daily.  . diazepam (VALIUM) 5 MG tablet TAKE 1  TABLET BY MOUTH EVERY 6 HOURS AS NEEDED FOR LEG SPASMS  . furosemide (LASIX) 40 MG tablet Take 1 tablet (40 mg total) by mouth daily.  Marland Kitchen gabapentin (NEURONTIN) 300 MG capsule TAKE 1 CAPSULE BY MOUTH 3 TIMES DAILY.  Marland Kitchen glucose blood test strip Test blood sugar three times per day.  . ibuprofen (ADVIL,MOTRIN) 400 MG tablet Take 1 tablet (400 mg total) by mouth every 6 (six) hours as needed for headache or moderate pain.  Marland Kitchen insulin aspart (NOVOLOG) 100 UNIT/ML FlexPen Inject 5 Units into the skin 3 (three) times daily with meals as needed for high blood sugar (if CBG >  200).  . Insulin Detemir (LEVEMIR FLEXTOUCH) 100 UNIT/ML Pen Inject 10 Units into the skin daily at 10 pm.  . Insulin Pen Needle 31G X 8 MM MISC Use to inject insulin 4x/day  . lisinopril (PRINIVIL,ZESTRIL) 5 MG tablet Take 1 tablet (5 mg total) by mouth daily.  . methocarbamol (ROBAXIN) 750 MG tablet TAKE 2 TABLETS (1,500 MG TOTAL) BY MOUTH 3 (THREE) TIMES DAILY. (Patient taking differently: Take 750 mg by mouth 6 (six) times daily. )  . Multiple Vitamins-Minerals (MULTIVITAMIN PO) Take 1 tablet by mouth daily.  Marland Kitchen oxybutynin (DITROPAN-XL) 5 MG 24 hr tablet Take 1 tablet (5 mg total) by mouth at bedtime.  . predniSONE (DELTASONE) 10 MG tablet Take 3 tablets (30 mg total) by mouth daily with breakfast. Take 40mg  3/20-22, then 30mg  every day.  . traMADol (ULTRAM) 50 MG tablet TAKE 1 TABLET BY MOUTH 4 TIMES DAILY  . trolamine salicylate (ASPERCREME) 10 % cream Apply 1 application topically as needed for muscle pain.  . Coenzyme Q10 (CO Q 10 PO) Take 1 tablet by mouth daily.  . colchicine 0.6 MG tablet Take 0.6 mg by mouth daily as needed (gout).   . collagenase (SANTYL) ointment Apply topically daily. (Patient not taking: Reported on 07/16/2016)  . dicyclomine (BENTYL) 20 MG tablet Take 1 tablet (20 mg total) by mouth 3 (three) times daily as needed for spasms (abdominal pain). (Patient not taking: Reported on 05/14/2016)  . docusate sodium (COLACE) 100 MG capsule Take 1 capsule (100 mg total) by mouth 2 (two) times daily. (Patient not taking: Reported on 08/06/2016)  . Magnesium 250 MG TABS Take 250 mg by mouth daily.  Marland Kitchen OVER THE COUNTER MEDICATION Take 2 drops by mouth daily. Hemp Extract  . polyethylene glycol (MIRALAX / GLYCOLAX) packet Take 17 g by mouth daily. (Patient not taking: Reported on 08/06/2016)   No facility-administered encounter medications on file as of 08/06/2016.   Patient was recently discharged from hospital and all medications have been reviewed.  Functional Status:   In your  present state of health, do you have any difficulty performing the following activities: 07/31/2016 07/16/2016  Hearing? N N  Vision? Y N  Difficulty concentrating or making decisions? N N  Walking or climbing stairs? Y Y  Dressing or bathing? Y Y  Doing errands, shopping? [place]  Preparing Food and eating ? Y -  Using the Toilet? Y -  In the past six months, have you accidently leaked urine? Y -  Do you have problems with loss of bowel control? Y -  Managing your Medications? N -  Managing your Finances? N -  Housekeeping or managing your Housekeeping? Y -  Some recent data might be hidden    Fall/Depression Screening:    PHQ 2/9 Scores 08/06/2016 05/14/2016 03/06/2016 02/09/2015 04/20/2014 11/08/2013  PHQ - 2 Score  0 0 1 0 0 0   Fall Risk  07/23/2016 05/01/2016 05/01/2016 03/07/2015 04/20/2014  Falls in the past year? Yes Yes No Yes No  Number falls in past yr: 1 1 - 2 or more -  Injury with Fall? Yes Yes - Yes -  Risk Factor Category  High Fall Risk - - - -  Risk for fall due to : Impaired balance/gait;Impaired mobility;History of fall(s) Impaired balance/gait;History of fall(s);Impaired mobility Impaired balance/gait;Impaired mobility Impaired balance/gait;Impaired mobility -  Follow up - Falls prevention discussed - - -   Assessment:    Recent sepsis UTI- foley in place urine clear Recent Diarrhea- resolved per patient, patient keeping hydrated tolerating liquids and diet .   Multiple Sclerosis  - continuing prednisone as prescribed,working with physical therapy on exercise twice weekly.Patient interested in different type of regimen and will discuss with therapist at visit today.  Patient out of bed to chair daily with family or home health aide service using hoyer lift. Patient recently stopped following MS support group online for a while due to volume of emails she receives,  continues to read MS magazine she receives.    Diabetes - no hypoglycemia episodes, 30 day average blood sugar  181. Monitoring carbohydrates,   Wound care - managed by home health, patient reports no open wound on sacral area( not able to observe)  Transportation/Community services - offered to place call to [facility] to assist with arranging patient transportation, patient prefers to make call. Discussed community service in [place] with [facility] received information from [facility] [place], [place],  for home bath aide,housekeeping as possible referral source in May.   Plan:  Will continue to follow for transition of care weekly outreaches, next telephone follow up call in a week.  Will continue to search for resources available in community  and assist patient with making contacts.  Will send PCP visit note.  Egbert Garibaldi, RN, [facility] Care Management [phone]- Mobile [phone]- Toll Free Main Office

## 2016-08-07 ENCOUNTER — Telehealth: Payer: Self-pay | Admitting: Family Medicine

## 2016-08-07 DIAGNOSIS — Z7982 Long term (current) use of aspirin: Secondary | ICD-10-CM | POA: Diagnosis not present

## 2016-08-07 DIAGNOSIS — M75101 Unspecified rotator cuff tear or rupture of right shoulder, not specified as traumatic: Secondary | ICD-10-CM | POA: Diagnosis not present

## 2016-08-07 DIAGNOSIS — Z794 Long term (current) use of insulin: Secondary | ICD-10-CM | POA: Diagnosis not present

## 2016-08-07 DIAGNOSIS — M48061 Spinal stenosis, lumbar region without neurogenic claudication: Secondary | ICD-10-CM | POA: Diagnosis not present

## 2016-08-07 DIAGNOSIS — M6281 Muscle weakness (generalized): Secondary | ICD-10-CM | POA: Diagnosis not present

## 2016-08-07 DIAGNOSIS — I10 Essential (primary) hypertension: Secondary | ICD-10-CM | POA: Diagnosis not present

## 2016-08-07 DIAGNOSIS — G35 Multiple sclerosis: Secondary | ICD-10-CM | POA: Diagnosis not present

## 2016-08-07 DIAGNOSIS — M4802 Spinal stenosis, cervical region: Secondary | ICD-10-CM | POA: Diagnosis not present

## 2016-08-07 DIAGNOSIS — E119 Type 2 diabetes mellitus without complications: Secondary | ICD-10-CM | POA: Diagnosis not present

## 2016-08-07 NOTE — Telephone Encounter (Signed)
Debbie from [facility] called and would like verbal orders for PRN for the patient ASAP. The patient's dog scratched her toe very badly and they need to address this so it doesn't get infected. Please call Debbie at [phone]. jw

## 2016-08-07 NOTE — Progress Notes (Signed)
Greatly appreciate care management support.

## 2016-08-07 NOTE — Telephone Encounter (Signed)
Dog scratched patient's toe.  OKed for them to recheck, Rx with bactroban and dress.

## 2016-08-09 ENCOUNTER — Other Ambulatory Visit: Payer: Self-pay | Admitting: Pharmacist

## 2016-08-09 NOTE — Patient Outreach (Signed)
Call to follow up with Ms. Askari about her extra help application and current out of pocket expense for the year for her patient assistance application. Called and spoke with patient. HIPAA identifiers verified and verbal consent received.  Ms. Skaff reports that she just received the denial letter from her extra help application in the mail. Advise patient to retain this letter for the patient assistance application. Ms. Mancillas reports that she has not yet reached the $1000 out of pocket expense requirement. Request that patient give me a call when she has met this limit. Ms. Hewins denies any further medication questions/concerns at this time. Patient confirms that she has my phone number.  If I have not heard from Ms. Tulloch in 1 month, will follow up with her again at that time.  Harlow Asa, PharmD, [place] Management [phone]

## 2016-08-13 ENCOUNTER — Other Ambulatory Visit: Payer: Self-pay | Admitting: *Deleted

## 2016-08-13 NOTE — Patient Outreach (Addendum)
[facility]) Care Management  08/13/2016  Barbara [name] 11/22/1955 [id number]   Placed weekly transition of care call to patient , no answer able to leave a hipaa compliant telephone message requesting a return call.  Plan Will await return call, if no response will place transition of care call within next week.   1212  Patient returned call, reports she is doing fine. Patient reports her blood sugars are running in the 130 range and taking insulin as ordered. Patient reports she is now on prednisone 20 mg as ordered daily.  Patient discussed scratch area on her toe from dog is doing fine. Patient discussed physical therapy visit on today and she will talk to him about her therapy routine.   Patient denies any new concerns at this time, family, neighbors and home health services continue to assist patient out of bed to recliner chair.     Plan Will place final 31 day transition of care call on next week.    Egbert Garibaldi, RN, [facility] Care Management [phone]- Mobile [phone]- Toll Free Main Office

## 2016-08-16 DIAGNOSIS — M6281 Muscle weakness (generalized): Secondary | ICD-10-CM | POA: Diagnosis not present

## 2016-08-16 DIAGNOSIS — E119 Type 2 diabetes mellitus without complications: Secondary | ICD-10-CM | POA: Diagnosis not present

## 2016-08-16 DIAGNOSIS — M4802 Spinal stenosis, cervical region: Secondary | ICD-10-CM | POA: Diagnosis not present

## 2016-08-16 DIAGNOSIS — I10 Essential (primary) hypertension: Secondary | ICD-10-CM | POA: Diagnosis not present

## 2016-08-16 DIAGNOSIS — Z794 Long term (current) use of insulin: Secondary | ICD-10-CM | POA: Diagnosis not present

## 2016-08-16 DIAGNOSIS — Z7982 Long term (current) use of aspirin: Secondary | ICD-10-CM | POA: Diagnosis not present

## 2016-08-16 DIAGNOSIS — G35 Multiple sclerosis: Secondary | ICD-10-CM | POA: Diagnosis not present

## 2016-08-16 DIAGNOSIS — M75101 Unspecified rotator cuff tear or rupture of right shoulder, not specified as traumatic: Secondary | ICD-10-CM | POA: Diagnosis not present

## 2016-08-16 DIAGNOSIS — M48061 Spinal stenosis, lumbar region without neurogenic claudication: Secondary | ICD-10-CM | POA: Diagnosis not present

## 2016-08-21 DIAGNOSIS — Z7982 Long term (current) use of aspirin: Secondary | ICD-10-CM | POA: Diagnosis not present

## 2016-08-21 DIAGNOSIS — G35 Multiple sclerosis: Secondary | ICD-10-CM | POA: Diagnosis not present

## 2016-08-21 DIAGNOSIS — I10 Essential (primary) hypertension: Secondary | ICD-10-CM | POA: Diagnosis not present

## 2016-08-21 DIAGNOSIS — M6281 Muscle weakness (generalized): Secondary | ICD-10-CM | POA: Diagnosis not present

## 2016-08-21 DIAGNOSIS — Z794 Long term (current) use of insulin: Secondary | ICD-10-CM | POA: Diagnosis not present

## 2016-08-21 DIAGNOSIS — E119 Type 2 diabetes mellitus without complications: Secondary | ICD-10-CM | POA: Diagnosis not present

## 2016-08-21 DIAGNOSIS — M48061 Spinal stenosis, lumbar region without neurogenic claudication: Secondary | ICD-10-CM | POA: Diagnosis not present

## 2016-08-21 DIAGNOSIS — M4802 Spinal stenosis, cervical region: Secondary | ICD-10-CM | POA: Diagnosis not present

## 2016-08-21 DIAGNOSIS — M75101 Unspecified rotator cuff tear or rupture of right shoulder, not specified as traumatic: Secondary | ICD-10-CM | POA: Diagnosis not present

## 2016-08-22 DIAGNOSIS — M6281 Muscle weakness (generalized): Secondary | ICD-10-CM | POA: Diagnosis not present

## 2016-08-22 DIAGNOSIS — E119 Type 2 diabetes mellitus without complications: Secondary | ICD-10-CM | POA: Diagnosis not present

## 2016-08-22 DIAGNOSIS — R531 Weakness: Secondary | ICD-10-CM | POA: Diagnosis not present

## 2016-08-22 DIAGNOSIS — M4802 Spinal stenosis, cervical region: Secondary | ICD-10-CM | POA: Diagnosis not present

## 2016-08-22 DIAGNOSIS — G35 Multiple sclerosis: Secondary | ICD-10-CM | POA: Diagnosis not present

## 2016-08-22 DIAGNOSIS — I5032 Chronic diastolic (congestive) heart failure: Secondary | ICD-10-CM | POA: Diagnosis not present

## 2016-08-22 DIAGNOSIS — L89152 Pressure ulcer of sacral region, stage 2: Secondary | ICD-10-CM | POA: Diagnosis not present

## 2016-08-23 ENCOUNTER — Other Ambulatory Visit: Payer: Self-pay | Admitting: *Deleted

## 2016-08-23 ENCOUNTER — Telehealth: Payer: Self-pay | Admitting: Family Medicine

## 2016-08-23 DIAGNOSIS — G35 Multiple sclerosis: Secondary | ICD-10-CM | POA: Diagnosis not present

## 2016-08-23 DIAGNOSIS — I10 Essential (primary) hypertension: Secondary | ICD-10-CM | POA: Diagnosis not present

## 2016-08-23 DIAGNOSIS — M6281 Muscle weakness (generalized): Secondary | ICD-10-CM | POA: Diagnosis not present

## 2016-08-23 DIAGNOSIS — M75101 Unspecified rotator cuff tear or rupture of right shoulder, not specified as traumatic: Secondary | ICD-10-CM | POA: Diagnosis not present

## 2016-08-23 DIAGNOSIS — M48061 Spinal stenosis, lumbar region without neurogenic claudication: Secondary | ICD-10-CM | POA: Diagnosis not present

## 2016-08-23 DIAGNOSIS — Z794 Long term (current) use of insulin: Secondary | ICD-10-CM | POA: Diagnosis not present

## 2016-08-23 DIAGNOSIS — M4802 Spinal stenosis, cervical region: Secondary | ICD-10-CM | POA: Diagnosis not present

## 2016-08-23 DIAGNOSIS — Z7982 Long term (current) use of aspirin: Secondary | ICD-10-CM | POA: Diagnosis not present

## 2016-08-23 DIAGNOSIS — E119 Type 2 diabetes mellitus without complications: Secondary | ICD-10-CM | POA: Diagnosis not present

## 2016-08-23 NOTE — Telephone Encounter (Signed)
Patient should be seen for further evaluation.  Have patient schedule an appt for Monday or go to urgent care or ED.  PCP is out of office all next week.  Clovis Pu, RN

## 2016-08-23 NOTE — Telephone Encounter (Signed)
Pt is using under wraps for legs. Left leg has a huge blister, that ruptured and there is tons on drainage, nurse is very concerned about the amount of drainage. Leg has a reddish tint to it. Huntley Dec did not put the [employer] back on. Huntley Dec believes pt might need an antibiotic for this, pt uses [facility] on [address]. The right leg is ok. ep

## 2016-08-23 NOTE — Telephone Encounter (Signed)
Spoke with Huntley Dec, RN and patient regarding blister on leg.  Patient has a quarter sized blistered left inner upper thigh that is draining.  Huntley Dec placed an alginate dressing topped with ABD pad and Kerlix.  The right leg has 2-3 + edema.  Advised that patient should be seen for further evaluation.  Informed that PCP is out of the office all today and all next week.  Patient stated she is unable to get to clinic due to unable to ambulate. She would have to call EMS for transportation.  Patient will have [name], RN who is her regular home health nurse will be out to make home visit tomorrow.  Advised them that someone should call the after hours line to speak with on-call provider for further assistance.  They were ok with that.  Will forward to PCP.  Clovis Pu, RN

## 2016-08-23 NOTE — Patient Outreach (Addendum)
[facility]) Care Management  08/23/2016  Keirstin Leacock Oct 17, 1955 [id number]  Transition of care call   Spoke with patient reports she is managing okay at home. Patient discussed she had a little more problem with UNNA boots this week, swelling in legs,  problem with skin, home health RN following up with MD office, patient states  things are going to work out. Patient has discussed more problems with spasms with legs.  Patient discussed she is  taking her prn medication and it is helping .    Patient discussed home health  physical therapy completed on this week and occupational therapy will complete on next week she will continue with bath aide and RN.  Discussed transportation concerns and offered to assist patient with coordinating, patient declined, states she has number and will place call. Patient discussed that she plans to communicate with PCP by email and not routine office visit and use emergency transportation if needed for now.   Offered assistance with any new concerns patient indicates  home health RN notifying MD of concern regarding UNNA boot, and skin concern.   Patient has completed 31 days of transition of care, will continue to follow for complex care management additional 30 days, patient agreeable to follow up call in the next 2 to 3 weeks, and agreed to call if new concerns or needs to be addressed.  Plan  Will plan follow up call in the next 2 to 3 weeks. Patient will notify MD of new worsening symptoms, seek medical attention and 911 for emergency.   Egbert Garibaldi, RN, [facility] Care Management [phone]- Mobile [phone]- Toll Free Main Office

## 2016-08-26 DIAGNOSIS — G35 Multiple sclerosis: Secondary | ICD-10-CM | POA: Diagnosis not present

## 2016-08-26 DIAGNOSIS — M4802 Spinal stenosis, cervical region: Secondary | ICD-10-CM | POA: Diagnosis not present

## 2016-08-26 DIAGNOSIS — M75101 Unspecified rotator cuff tear or rupture of right shoulder, not specified as traumatic: Secondary | ICD-10-CM | POA: Diagnosis not present

## 2016-08-26 DIAGNOSIS — Z794 Long term (current) use of insulin: Secondary | ICD-10-CM | POA: Diagnosis not present

## 2016-08-26 DIAGNOSIS — M48061 Spinal stenosis, lumbar region without neurogenic claudication: Secondary | ICD-10-CM | POA: Diagnosis not present

## 2016-08-26 DIAGNOSIS — M6281 Muscle weakness (generalized): Secondary | ICD-10-CM | POA: Diagnosis not present

## 2016-08-26 DIAGNOSIS — E119 Type 2 diabetes mellitus without complications: Secondary | ICD-10-CM | POA: Diagnosis not present

## 2016-08-26 DIAGNOSIS — I10 Essential (primary) hypertension: Secondary | ICD-10-CM | POA: Diagnosis not present

## 2016-08-26 DIAGNOSIS — Z7982 Long term (current) use of aspirin: Secondary | ICD-10-CM | POA: Diagnosis not present

## 2016-08-28 ENCOUNTER — Telehealth: Payer: Self-pay | Admitting: Family Medicine

## 2016-08-28 DIAGNOSIS — E119 Type 2 diabetes mellitus without complications: Secondary | ICD-10-CM | POA: Diagnosis not present

## 2016-08-28 DIAGNOSIS — I10 Essential (primary) hypertension: Secondary | ICD-10-CM | POA: Diagnosis not present

## 2016-08-28 DIAGNOSIS — Z7982 Long term (current) use of aspirin: Secondary | ICD-10-CM | POA: Diagnosis not present

## 2016-08-28 DIAGNOSIS — M4802 Spinal stenosis, cervical region: Secondary | ICD-10-CM | POA: Diagnosis not present

## 2016-08-28 DIAGNOSIS — M6281 Muscle weakness (generalized): Secondary | ICD-10-CM | POA: Diagnosis not present

## 2016-08-28 DIAGNOSIS — M48061 Spinal stenosis, lumbar region without neurogenic claudication: Secondary | ICD-10-CM | POA: Diagnosis not present

## 2016-08-28 DIAGNOSIS — Z794 Long term (current) use of insulin: Secondary | ICD-10-CM | POA: Diagnosis not present

## 2016-08-28 DIAGNOSIS — M75101 Unspecified rotator cuff tear or rupture of right shoulder, not specified as traumatic: Secondary | ICD-10-CM | POA: Diagnosis not present

## 2016-08-28 DIAGNOSIS — G35 Multiple sclerosis: Secondary | ICD-10-CM | POA: Diagnosis not present

## 2016-08-28 MED FILL — predniSONE 10 MG TABS: 10 | 90 days supply | Qty: 270 | Fill #2

## 2016-08-28 NOTE — Telephone Encounter (Signed)
[name] nurse requesting verbal order but he would not leave message regarding specifics of verbal order. Said he wanted to speak to Dr. himself. I'm covering Dr. Cyndia Skeeters in box while he is out. I called [name]  at [phone]  He said she was having a lot of drainage from some open blisters on her leg and there is some increasing erythema. He was worried she was having recurrence of cellulitis and wanted me to "call in an antibiotic";  she was last hospitalized with septic shock from cellulitis in March 2018. I do not think it's a good idea as call in an antibiotic without her being seen. If it truly is cellulitis,, she likely needs IV antibiotics. I explained this to [place]. He said the issues that make it hard for her to come in is transportation. Evidently she needs a Marketing executive and EMS for transport. I explained the importance that she absolutely needs to be seen if he thinks there re some sign of infection. He should they would work on trying to get her transportation. She has not had fever.

## 2016-08-29 DIAGNOSIS — E119 Type 2 diabetes mellitus without complications: Secondary | ICD-10-CM | POA: Diagnosis not present

## 2016-08-29 DIAGNOSIS — G35 Multiple sclerosis: Secondary | ICD-10-CM | POA: Diagnosis not present

## 2016-08-29 DIAGNOSIS — M4802 Spinal stenosis, cervical region: Secondary | ICD-10-CM | POA: Diagnosis not present

## 2016-08-29 DIAGNOSIS — R531 Weakness: Secondary | ICD-10-CM | POA: Diagnosis not present

## 2016-08-30 ENCOUNTER — Telehealth: Payer: Self-pay

## 2016-08-30 ENCOUNTER — Other Ambulatory Visit: Payer: Self-pay | Admitting: *Deleted

## 2016-08-30 DIAGNOSIS — E119 Type 2 diabetes mellitus without complications: Secondary | ICD-10-CM | POA: Diagnosis not present

## 2016-08-30 DIAGNOSIS — M6281 Muscle weakness (generalized): Secondary | ICD-10-CM | POA: Diagnosis not present

## 2016-08-30 DIAGNOSIS — I10 Essential (primary) hypertension: Secondary | ICD-10-CM | POA: Diagnosis not present

## 2016-08-30 DIAGNOSIS — Z466 Encounter for fitting and adjustment of urinary device: Secondary | ICD-10-CM | POA: Diagnosis not present

## 2016-08-30 DIAGNOSIS — L97222 Non-pressure chronic ulcer of left calf with fat layer exposed: Secondary | ICD-10-CM | POA: Diagnosis not present

## 2016-08-30 DIAGNOSIS — L03116 Cellulitis of left lower limb: Secondary | ICD-10-CM | POA: Diagnosis not present

## 2016-08-30 DIAGNOSIS — L03119 Cellulitis of unspecified part of limb: Principal | ICD-10-CM

## 2016-08-30 DIAGNOSIS — G35 Multiple sclerosis: Secondary | ICD-10-CM | POA: Diagnosis not present

## 2016-08-30 DIAGNOSIS — L02419 Cutaneous abscess of limb, unspecified: Secondary | ICD-10-CM

## 2016-08-30 DIAGNOSIS — M48061 Spinal stenosis, lumbar region without neurogenic claudication: Secondary | ICD-10-CM | POA: Diagnosis not present

## 2016-08-30 DIAGNOSIS — M75101 Unspecified rotator cuff tear or rupture of right shoulder, not specified as traumatic: Secondary | ICD-10-CM | POA: Diagnosis not present

## 2016-08-30 NOTE — Patient Outreach (Signed)
[facility]) Care Management  08/30/2016  Barbara [name] 06/28/1955 [id number]  Telephone call   Spoke with patient , she is sitting in her recliner chair, home health RN has visited on this morning.  Patient discussed concerns regarding not being able to get an antibiotic prescribed without being seen in the office. Discussed with patient importance of MD evaluation and in light of her  recent hospitalization related to cellulitis.   Patient discussed cost of non emergency transportation as one concern and questions if she will be need to be moved from stretcher for exam and how that will work. Patient discussed that if she was to use another transport service and use her scooter that would require family to get off work to assist her with hoyer lift before and after visit, patient also expressed concern if she could be examined while in her chair and how she would get to exam table.Informed that I could contact office regarding her concerns. Discussed with patient we are available to assist her with coordinating transportation to appointment, for wound follow up as needed she is agreeable, but wants to follow up with Dr.Hensel .  Discussed importance of MD follow up for signs of infection.   Placed care coordination call to [place] , home health RN with [facility] that has been following patient , he states wound is looking a little better today  and they have figured out a way to elevate legs, no fever.  [name] plans next home visit on Monday, 4/30, and will call office with update on wound.  Placed call to Dr.Hensel office to discuss patient concerns regarding office visit, spoke with sherry regarding patient questions regarding being able to be examined while she is in scooter chair or on stretcher, Cordelia Pen states they will be able to accommodate patient in either situation and she will communicate patient concerns regarding office visit with MD.   Placed return call to patient to  reassure  her that office with be able to accommodate her whether she arrives at visit by stretcher or scooter chair.    Plan Will follow up with patient on next week regarding assisting with transportation arrangement when she agrees to office visit as has been  recommended.  Patient will seek medical attention for worsening signs of infection.  Egbert Garibaldi, RN, [facility] Care Management [phone]- Mobile [phone]- Toll Free Main Office

## 2016-08-30 NOTE — Telephone Encounter (Signed)
Ander Purpura from [facility] called. Home health nurse has noticed some blisters on the pt. The area seems better today. Home health nurse will keep an eye on it this weekend and will call Monday to give [place] an update. Pt wanted [name] to be aware. Ander Purpura can be reached at [phone] if you have any questions.  Sunday Spillers, CMA

## 2016-08-31 ENCOUNTER — Telehealth: Payer: Self-pay | Admitting: Family Medicine

## 2016-08-31 NOTE — Telephone Encounter (Signed)
After hours telephone call  HH calls to report <1000 cc of UOP since 6am today.  Output is "milky." Agreed to send RN out to house to flush foley catheter and get UA.  If will not flush or not draining well, can change foley.  Erasmo Downer, MD, MPH PGY-3,  [facility] 08/31/2016 5:42 PM

## 2016-09-02 MED ORDER — DOXYCYCLINE HYCLATE 100 MG PO TABS: 100.0000 mg | ORAL_TABLET | Freq: Two times a day (BID) | ORAL | 0 refills | Status: DC

## 2016-09-02 MED FILL — DOXYCYCLINE HYCLATE 100 MG: 100 | 14 days supply | Qty: 28 | Fill #0

## 2016-09-02 NOTE — Telephone Encounter (Signed)
Spoke with [name].  Leg is more red.  Will Rx for cellulitis with doxy.

## 2016-09-03 ENCOUNTER — Other Ambulatory Visit: Payer: Self-pay | Admitting: *Deleted

## 2016-09-03 MED FILL — FUROSEMIDE 40 MG TABLET: 40 | 30 days supply | Qty: 30 | Fill #0

## 2016-09-03 NOTE — Patient Outreach (Signed)
[facility]) Care Management  09/03/2016  Barbara [name] 08/24/1955 [id number]   Telephone follow up call  Patient reports she now has antibiotic prescribed for redness at leg areas after home health visit on yesterday and communication with PCP.  Patient discussed she is resting in recliner chair at this after being assisted by hoyer lift. Patient discussed blood sugars are under good control and she continues to take insulin and all medications as prescribed.   Patient continues with home health follow up with [facility] RN and bath aide. Discussed with patient resource in [employer] for home personal care service that she may qualify for, offered to contact our social worker regarding making referral , patient declined stating she would call me if she decides to pursue.   Patient discussed her plan to continue follow up with PCP my phone/e-mail and not pursue clinic visits at this time and MD is aware.   Plan Continue to follow patient for 60 day  transition of care complex management , next call in 3 weeks patient agreeable.  Patient denies new concerns or goals  at this time,  will continue to follow and address new concerns that arise.  Patient will notify MD for new concerns or worsening  of symptoms and 911 for emergency.    Egbert Garibaldi, RN, [facility] Care Management [phone]- Mobile [phone]- Toll Free Main Office

## 2016-09-04 DIAGNOSIS — L03116 Cellulitis of left lower limb: Secondary | ICD-10-CM | POA: Diagnosis not present

## 2016-09-04 DIAGNOSIS — M75101 Unspecified rotator cuff tear or rupture of right shoulder, not specified as traumatic: Secondary | ICD-10-CM | POA: Diagnosis not present

## 2016-09-04 DIAGNOSIS — M6281 Muscle weakness (generalized): Secondary | ICD-10-CM | POA: Diagnosis not present

## 2016-09-04 DIAGNOSIS — L97222 Non-pressure chronic ulcer of left calf with fat layer exposed: Secondary | ICD-10-CM | POA: Diagnosis not present

## 2016-09-04 DIAGNOSIS — Z466 Encounter for fitting and adjustment of urinary device: Secondary | ICD-10-CM | POA: Diagnosis not present

## 2016-09-04 DIAGNOSIS — G35 Multiple sclerosis: Secondary | ICD-10-CM | POA: Diagnosis not present

## 2016-09-04 DIAGNOSIS — M48061 Spinal stenosis, lumbar region without neurogenic claudication: Secondary | ICD-10-CM | POA: Diagnosis not present

## 2016-09-04 DIAGNOSIS — I10 Essential (primary) hypertension: Secondary | ICD-10-CM | POA: Diagnosis not present

## 2016-09-04 DIAGNOSIS — E119 Type 2 diabetes mellitus without complications: Secondary | ICD-10-CM | POA: Diagnosis not present

## 2016-09-09 ENCOUNTER — Other Ambulatory Visit: Payer: Self-pay | Admitting: Pharmacist

## 2016-09-09 NOTE — Patient Outreach (Signed)
Call to follow up with Barbara [name] about her current out of pocket expense for the year for her patient assistance application. Called and spoke with patient. HIPAA identifiers verified and verbal consent received.  Barbara [name] reports that she has not yet reached the $1000 out of pocket expense requirement. Request that patient give me a call when she has met this limit. Barbara [name] denies any further medication questions/concerns at this time.   If I have not heard from Barbara [name] in 1 month, will follow up with her again at that time.  Harlow Asa, PharmD, [facility] Management [phone]

## 2016-09-10 ENCOUNTER — Telehealth: Payer: Self-pay | Admitting: Family Medicine

## 2016-09-10 DIAGNOSIS — E119 Type 2 diabetes mellitus without complications: Secondary | ICD-10-CM | POA: Diagnosis not present

## 2016-09-10 DIAGNOSIS — Z466 Encounter for fitting and adjustment of urinary device: Secondary | ICD-10-CM | POA: Diagnosis not present

## 2016-09-10 DIAGNOSIS — L97222 Non-pressure chronic ulcer of left calf with fat layer exposed: Secondary | ICD-10-CM | POA: Diagnosis not present

## 2016-09-10 DIAGNOSIS — L03116 Cellulitis of left lower limb: Secondary | ICD-10-CM | POA: Diagnosis not present

## 2016-09-10 DIAGNOSIS — M48061 Spinal stenosis, lumbar region without neurogenic claudication: Secondary | ICD-10-CM | POA: Diagnosis not present

## 2016-09-10 DIAGNOSIS — G35 Multiple sclerosis: Secondary | ICD-10-CM | POA: Diagnosis not present

## 2016-09-10 DIAGNOSIS — I10 Essential (primary) hypertension: Secondary | ICD-10-CM | POA: Diagnosis not present

## 2016-09-10 DIAGNOSIS — M75101 Unspecified rotator cuff tear or rupture of right shoulder, not specified as traumatic: Secondary | ICD-10-CM | POA: Diagnosis not present

## 2016-09-10 DIAGNOSIS — M6281 Muscle weakness (generalized): Secondary | ICD-10-CM | POA: Diagnosis not present

## 2016-09-10 MED FILL — ONE TOUCH DELICA 33G LANCET: 50 days supply | Qty: 100 | Fill #0

## 2016-09-10 NOTE — Telephone Encounter (Signed)
Called and [employer] order.

## 2016-09-10 NOTE — Telephone Encounter (Signed)
Toniann Fail need verbal orders for PRN visits. ep

## 2016-09-16 ENCOUNTER — Other Ambulatory Visit: Payer: Self-pay | Admitting: *Deleted

## 2016-09-16 DIAGNOSIS — E119 Type 2 diabetes mellitus without complications: Secondary | ICD-10-CM

## 2016-09-16 MED ORDER — INSULIN GLARGINE 100 UNIT/ML SOLOSTAR PEN: 10.0000 [IU] | PEN_INJECTOR | Freq: Every day | SUBCUTANEOUS | 11 refills | Status: DC

## 2016-09-16 MED FILL — LANTUS SOLOSTAR 100 UNITS/M: 100 | 150 days supply | Qty: 15 | Fill #0

## 2016-09-16 MED FILL — traMADol HCL 50 MG TABS: 50 | 90 days supply | Qty: 360 | Fill #0

## 2016-09-17 DIAGNOSIS — I10 Essential (primary) hypertension: Secondary | ICD-10-CM | POA: Diagnosis not present

## 2016-09-17 DIAGNOSIS — L03116 Cellulitis of left lower limb: Secondary | ICD-10-CM | POA: Diagnosis not present

## 2016-09-17 DIAGNOSIS — E119 Type 2 diabetes mellitus without complications: Secondary | ICD-10-CM | POA: Diagnosis not present

## 2016-09-17 DIAGNOSIS — M75101 Unspecified rotator cuff tear or rupture of right shoulder, not specified as traumatic: Secondary | ICD-10-CM | POA: Diagnosis not present

## 2016-09-17 DIAGNOSIS — M6281 Muscle weakness (generalized): Secondary | ICD-10-CM | POA: Diagnosis not present

## 2016-09-17 DIAGNOSIS — G35 Multiple sclerosis: Secondary | ICD-10-CM | POA: Diagnosis not present

## 2016-09-17 DIAGNOSIS — L97222 Non-pressure chronic ulcer of left calf with fat layer exposed: Secondary | ICD-10-CM | POA: Diagnosis not present

## 2016-09-17 DIAGNOSIS — M48061 Spinal stenosis, lumbar region without neurogenic claudication: Secondary | ICD-10-CM | POA: Diagnosis not present

## 2016-09-17 DIAGNOSIS — Z466 Encounter for fitting and adjustment of urinary device: Secondary | ICD-10-CM | POA: Diagnosis not present

## 2016-09-21 DIAGNOSIS — I5032 Chronic diastolic (congestive) heart failure: Secondary | ICD-10-CM | POA: Diagnosis not present

## 2016-09-21 DIAGNOSIS — R531 Weakness: Secondary | ICD-10-CM | POA: Diagnosis not present

## 2016-09-21 DIAGNOSIS — G35 Multiple sclerosis: Secondary | ICD-10-CM | POA: Diagnosis not present

## 2016-09-21 DIAGNOSIS — L89152 Pressure ulcer of sacral region, stage 2: Secondary | ICD-10-CM | POA: Diagnosis not present

## 2016-09-21 DIAGNOSIS — M6281 Muscle weakness (generalized): Secondary | ICD-10-CM | POA: Diagnosis not present

## 2016-09-21 DIAGNOSIS — M4802 Spinal stenosis, cervical region: Secondary | ICD-10-CM | POA: Diagnosis not present

## 2016-09-21 DIAGNOSIS — E119 Type 2 diabetes mellitus without complications: Secondary | ICD-10-CM | POA: Diagnosis not present

## 2016-09-23 ENCOUNTER — Other Ambulatory Visit: Payer: Self-pay | Admitting: *Deleted

## 2016-09-23 ENCOUNTER — Telehealth: Payer: Self-pay | Admitting: Family Medicine

## 2016-09-23 DIAGNOSIS — M48061 Spinal stenosis, lumbar region without neurogenic claudication: Secondary | ICD-10-CM | POA: Diagnosis not present

## 2016-09-23 DIAGNOSIS — L03116 Cellulitis of left lower limb: Secondary | ICD-10-CM | POA: Diagnosis not present

## 2016-09-23 DIAGNOSIS — L97222 Non-pressure chronic ulcer of left calf with fat layer exposed: Secondary | ICD-10-CM | POA: Diagnosis not present

## 2016-09-23 DIAGNOSIS — G35 Multiple sclerosis: Secondary | ICD-10-CM | POA: Diagnosis not present

## 2016-09-23 DIAGNOSIS — I10 Essential (primary) hypertension: Secondary | ICD-10-CM | POA: Diagnosis not present

## 2016-09-23 DIAGNOSIS — E119 Type 2 diabetes mellitus without complications: Secondary | ICD-10-CM | POA: Diagnosis not present

## 2016-09-23 DIAGNOSIS — M6281 Muscle weakness (generalized): Secondary | ICD-10-CM | POA: Diagnosis not present

## 2016-09-23 DIAGNOSIS — M75101 Unspecified rotator cuff tear or rupture of right shoulder, not specified as traumatic: Secondary | ICD-10-CM | POA: Diagnosis not present

## 2016-09-23 DIAGNOSIS — Z466 Encounter for fitting and adjustment of urinary device: Secondary | ICD-10-CM | POA: Diagnosis not present

## 2016-09-23 MED FILL — GABAPENTIN 300 MG CAPSULE: 300 | 90 days supply | Qty: 270 | Fill #1

## 2016-09-23 NOTE — Telephone Encounter (Signed)
[name] called from [facility] and just wanted to let the doctor know that the patient seems down. She is having some flank pain and not being herself. If you have any questions please call her at [phone]. jw

## 2016-09-23 NOTE — Patient Outreach (Addendum)
[facility]) Care Management  09/23/2016  Barbara [name] 03-16-1956 [id number]   Care coordination care call/Patient telephone follow up   Placed call to [facility] home health regarding patient progress, able to speak with Gavin Pound, Production designer, theatre/television/film. She discussed patient visited by home health RN on today  reports patient still has wraps to her legs and skin irritation to back of legs discussed foley catheter change on today.   She discussed patient is still followed by home health RN and aide services, discussed  concern for patient being at home alone at night .She provided direct contact number to [facility] primary RN following patient. Spoke with [name] he reports patient  urine with sediment, foley changed on today he states patient complained of right and left flank pain and feeling foggy headed on today.    Home health RN discussed some healing to leg wound. He also  discussed safety concerns with patient staying alone  related to patient immobility.  Spoke with patient reports she is not feeling as good on today and states not every body feels good everyday. Patient discussed she is resting in bed on today and foley catheter has been changed.  Patient discussed she believes the wounds on her legs are improving. Offered to notify Dr.Hensel that she is not feeling well today , she declined. Discussed symptoms of concerns of notify MD, Home health RN or RNCM  of pain, fever, nausea .   Patient reports her blood sugars have been running a little higher lately in the 200 range.  since she has been drinking cranberry juice.  Discussed with patient and  her goal is to remain at home with care, and contact  PCP with new symptom concerns prefers to have follow up by telephone and not routine office visits.   Discussed resource of [facility] in [facility], that has bath aide service, explained there is a waiting list , reviewed process with her.   Discussed with patient  that this service is at not cost, she is agreeable at this referral. .  Patient has completed 60 days of transition of care , agreeable to visit in next week, complex care management of Chronic disease , Multiple Sclerosis    Plan Placed call discuss with  Dr.Hensel office able to leave a message, regarding report from home health RN that visited patient on today and my telephone contact on today.    Will make contact with [facility] for referral for bath aide services Patient is agreeable to home visit in the next week.  Will route this visit note to PCP.     Egbert Garibaldi, RN, [facility] Care Management [phone]- Mobile [phone]- Toll Free Main Office

## 2016-09-24 ENCOUNTER — Encounter (HOSPITAL_COMMUNITY): Payer: Self-pay | Admitting: Emergency Medicine

## 2016-09-24 ENCOUNTER — Emergency Department (HOSPITAL_COMMUNITY): Payer: PPO

## 2016-09-24 ENCOUNTER — Inpatient Hospital Stay (HOSPITAL_COMMUNITY)
Admission: EM | Admit: 2016-09-24 | Discharge: 2016-09-27 | DRG: 872 | Disposition: A | Discharge status: Home Health | Payer: PPO | Attending: Family Medicine | Admitting: Family Medicine

## 2016-09-24 DIAGNOSIS — Z66 Do not resuscitate: Secondary | ICD-10-CM | POA: Diagnosis not present

## 2016-09-24 DIAGNOSIS — G35 Multiple sclerosis: Secondary | ICD-10-CM | POA: Diagnosis present

## 2016-09-24 DIAGNOSIS — E875 Hyperkalemia: Secondary | ICD-10-CM | POA: Diagnosis not present

## 2016-09-24 DIAGNOSIS — R1013 Epigastric pain: Secondary | ICD-10-CM

## 2016-09-24 DIAGNOSIS — I9589 Other hypotension: Secondary | ICD-10-CM | POA: Diagnosis not present

## 2016-09-24 DIAGNOSIS — D72829 Elevated white blood cell count, unspecified: Secondary | ICD-10-CM | POA: Diagnosis not present

## 2016-09-24 DIAGNOSIS — B999 Unspecified infectious disease: Secondary | ICD-10-CM | POA: Diagnosis not present

## 2016-09-24 DIAGNOSIS — R739 Hyperglycemia, unspecified: Secondary | ICD-10-CM | POA: Diagnosis not present

## 2016-09-24 DIAGNOSIS — R7889 Finding of other specified substances, not normally found in blood: Secondary | ICD-10-CM | POA: Diagnosis not present

## 2016-09-24 DIAGNOSIS — E2740 Unspecified adrenocortical insufficiency: Secondary | ICD-10-CM | POA: Diagnosis not present

## 2016-09-24 DIAGNOSIS — N319 Neuromuscular dysfunction of bladder, unspecified: Secondary | ICD-10-CM | POA: Diagnosis present

## 2016-09-24 DIAGNOSIS — Z794 Long term (current) use of insulin: Secondary | ICD-10-CM | POA: Diagnosis not present

## 2016-09-24 DIAGNOSIS — L03116 Cellulitis of left lower limb: Secondary | ICD-10-CM | POA: Diagnosis not present

## 2016-09-24 DIAGNOSIS — G8220 Paraplegia, unspecified: Secondary | ICD-10-CM | POA: Diagnosis present

## 2016-09-24 DIAGNOSIS — I959 Hypotension, unspecified: Secondary | ICD-10-CM

## 2016-09-24 DIAGNOSIS — L97929 Non-pressure chronic ulcer of unspecified part of left lower leg with unspecified severity: Secondary | ICD-10-CM | POA: Diagnosis present

## 2016-09-24 DIAGNOSIS — E11622 Type 2 diabetes mellitus with other skin ulcer: Secondary | ICD-10-CM | POA: Diagnosis present

## 2016-09-24 DIAGNOSIS — N179 Acute kidney failure, unspecified: Secondary | ICD-10-CM

## 2016-09-24 DIAGNOSIS — N3090 Cystitis, unspecified without hematuria: Secondary | ICD-10-CM | POA: Diagnosis not present

## 2016-09-24 DIAGNOSIS — R109 Unspecified abdominal pain: Secondary | ICD-10-CM

## 2016-09-24 DIAGNOSIS — R1032 Left lower quadrant pain: Secondary | ICD-10-CM | POA: Diagnosis not present

## 2016-09-24 DIAGNOSIS — Z7982 Long term (current) use of aspirin: Secondary | ICD-10-CM | POA: Diagnosis not present

## 2016-09-24 DIAGNOSIS — R10819 Abdominal tenderness, unspecified site: Secondary | ICD-10-CM

## 2016-09-24 DIAGNOSIS — R008 Other abnormalities of heart beat: Secondary | ICD-10-CM | POA: Diagnosis not present

## 2016-09-24 DIAGNOSIS — L03119 Cellulitis of unspecified part of limb: Secondary | ICD-10-CM

## 2016-09-24 DIAGNOSIS — N12 Tubulo-interstitial nephritis, not specified as acute or chronic: Secondary | ICD-10-CM | POA: Diagnosis not present

## 2016-09-24 DIAGNOSIS — I10 Essential (primary) hypertension: Secondary | ICD-10-CM | POA: Diagnosis not present

## 2016-09-24 DIAGNOSIS — A419 Sepsis, unspecified organism: Principal | ICD-10-CM | POA: Diagnosis present

## 2016-09-24 DIAGNOSIS — Z79899 Other long term (current) drug therapy: Secondary | ICD-10-CM | POA: Diagnosis not present

## 2016-09-24 DIAGNOSIS — Z7952 Long term (current) use of systemic steroids: Secondary | ICD-10-CM | POA: Diagnosis not present

## 2016-09-24 DIAGNOSIS — R1010 Upper abdominal pain, unspecified: Secondary | ICD-10-CM | POA: Diagnosis not present

## 2016-09-24 DIAGNOSIS — Z7409 Other reduced mobility: Secondary | ICD-10-CM | POA: Diagnosis not present

## 2016-09-24 DIAGNOSIS — L0390 Cellulitis, unspecified: Secondary | ICD-10-CM | POA: Diagnosis not present

## 2016-09-24 DIAGNOSIS — B9689 Other specified bacterial agents as the cause of diseases classified elsewhere: Secondary | ICD-10-CM | POA: Diagnosis not present

## 2016-09-24 HISTORY — DX: Cellulitis of unspecified part of limb: L03.119

## 2016-09-24 LAB — URINALYSIS, ROUTINE W REFLEX MICROSCOPIC
BILIRUBIN URINE: NEGATIVE
GLUCOSE, UA: NEGATIVE mg/dL
Ketones, ur: NEGATIVE mg/dL
Nitrite: POSITIVE — AB
Protein, ur: NEGATIVE mg/dL
SQUAMOUS EPITHELIAL / LPF: NONE SEEN
Specific Gravity, Urine: 1.011 (ref 1.005–1.030)
pH: 9 — ABNORMAL HIGH (ref 5.0–8.0)

## 2016-09-24 LAB — CBC WITH DIFFERENTIAL/PLATELET
BASOS PCT: 0 %
Basophils Absolute: 0 10*3/uL (ref 0.0–0.1)
EOS PCT: 1 %
Eosinophils Absolute: 0.1 10*3/uL (ref 0.0–0.7)
HEMATOCRIT: 36.2 % (ref 36.0–46.0)
HEMOGLOBIN: 11.6 g/dL — AB (ref 12.0–15.0)
Lymphocytes Relative: 7 %
Lymphs Abs: 0.9 10*3/uL (ref 0.7–4.0)
MCH: 29.4 pg (ref 26.0–34.0)
MCHC: 32 g/dL (ref 30.0–36.0)
MCV: 91.9 fL (ref 78.0–100.0)
Monocytes Absolute: 0.5 10*3/uL (ref 0.1–1.0)
Monocytes Relative: 4 %
Neutro Abs: 11.3 10*3/uL — ABNORMAL HIGH (ref 1.7–7.7)
Neutrophils Relative %: 88 %
Platelets: 204 10*3/uL (ref 150–400)
RBC: 3.94 MIL/uL (ref 3.87–5.11)
RDW: 18.3 % — ABNORMAL HIGH (ref 11.5–15.5)
WBC: 12.8 10*3/uL — AB (ref 4.0–10.5)

## 2016-09-24 LAB — I-STAT CHEM 8, ED
BUN: 73 mg/dL — AB (ref 6–20)
CALCIUM ION: 1.15 mmol/L (ref 1.15–1.40)
CHLORIDE: 98 mmol/L — AB (ref 101–111)
Creatinine, Ser: 1.4 mg/dL — ABNORMAL HIGH (ref 0.44–1.00)
GLUCOSE: 300 mg/dL — AB (ref 65–99)
HCT: 34 % — ABNORMAL LOW (ref 36.0–46.0)
Hemoglobin: 11.6 g/dL — ABNORMAL LOW (ref 12.0–15.0)
Potassium: 5.1 mmol/L (ref 3.5–5.1)
SODIUM: 135 mmol/L (ref 135–145)
TCO2: 30 mmol/L (ref 0–100)

## 2016-09-24 LAB — I-STAT VENOUS BLOOD GAS, ED
ACID-BASE EXCESS: 6 mmol/L — AB (ref 0.0–2.0)
BICARBONATE: 33.8 mmol/L — AB (ref 20.0–28.0)
O2 Saturation: 32 %
PH VEN: 7.319 (ref 7.250–7.430)
TCO2: 36 mmol/L (ref 0–100)
pCO2, Ven: 65.8 mmHg — ABNORMAL HIGH (ref 44.0–60.0)
pO2, Ven: 23 mmHg — CL (ref 32.0–45.0)

## 2016-09-24 LAB — COMPREHENSIVE METABOLIC PANEL
ALT: 22 U/L (ref 14–54)
AST: 31 U/L (ref 15–41)
Albumin: 3.2 g/dL — ABNORMAL LOW (ref 3.5–5.0)
Alkaline Phosphatase: 80 U/L (ref 38–126)
Anion gap: 10 (ref 5–15)
BILIRUBIN TOTAL: 0.7 mg/dL (ref 0.3–1.2)
BUN: 67 mg/dL — AB (ref 6–20)
CO2: 27 mmol/L (ref 22–32)
CREATININE: 1.27 mg/dL — AB (ref 0.44–1.00)
Calcium: 9.3 mg/dL (ref 8.9–10.3)
Chloride: 99 mmol/L — ABNORMAL LOW (ref 101–111)
GFR calc Af Amer: 52 mL/min — ABNORMAL LOW (ref >60)
GFR, EST NON AFRICAN AMERICAN: 45 mL/min — AB (ref >60)
Glucose, Bld: 302 mg/dL — ABNORMAL HIGH (ref 65–99)
Potassium: 5.2 mmol/L — ABNORMAL HIGH (ref 3.5–5.1)
Sodium: 136 mmol/L (ref 135–145)
TOTAL PROTEIN: 6.5 g/dL (ref 6.5–8.1)

## 2016-09-24 LAB — LIPASE, BLOOD
LIPASE: 31 U/L (ref 11–51)
Lipase: 38 U/L (ref 11–51)

## 2016-09-24 LAB — I-STAT CG4 LACTIC ACID, ED: Lactic Acid, Venous: 1.46 mmol/L (ref 0.5–1.9)

## 2016-09-24 LAB — MAGNESIUM: Magnesium: 2.2 mg/dL (ref 1.7–2.4)

## 2016-09-24 LAB — I-STAT TROPONIN, ED: TROPONIN I, POC: 0.01 ng/mL (ref 0.00–0.08)

## 2016-09-24 LAB — GLUCOSE, CAPILLARY: Glucose-Capillary: 232 mg/dL — ABNORMAL HIGH (ref 65–99)

## 2016-09-24 MED ORDER — TRAMADOL HCL 50 MG PO TABS
50.0000 mg | ORAL_TABLET | Freq: Two times a day (BID) | ORAL | Status: DC | PRN
Start: 2016-09-24 — End: 2016-09-27
  Administered 2016-09-24 – 2016-09-27 (×5): 50 mg via ORAL
  Filled 2016-09-24 (×5): qty 1

## 2016-09-24 MED ORDER — TRAMADOL HCL 50 MG PO TABS
50.0000 mg | ORAL_TABLET | Freq: Once | ORAL | Status: AC
  Administered 2016-09-24: 50 mg via ORAL
  Filled 2016-09-24: qty 1

## 2016-09-24 MED ORDER — ASPIRIN EC 325 MG PO TBEC
325.0000 mg | DELAYED_RELEASE_TABLET | Freq: Every day | ORAL | Status: DC
  Administered 2016-09-24 – 2016-09-27 (×4): 325 mg via ORAL
  Filled 2016-09-24 (×4): qty 1

## 2016-09-24 MED ORDER — VANCOMYCIN HCL 10 G IV SOLR
2500.0000 mg | Freq: Once | INTRAVENOUS | Status: AC
  Administered 2016-09-24: 2500 mg via INTRAVENOUS
  Filled 2016-09-24: qty 2500

## 2016-09-24 MED ORDER — DEXTROSE 5 % IV SOLN
2.0000 g | Freq: Once | INTRAVENOUS | Status: AC
  Administered 2016-09-24: 2 g via INTRAVENOUS
  Filled 2016-09-24: qty 2

## 2016-09-24 MED ORDER — SODIUM CHLORIDE 0.9 % IV BOLUS (SEPSIS)
1000.0000 mL | Freq: Once | INTRAVENOUS | Status: AC
  Administered 2016-09-24: 1000 mL via INTRAVENOUS

## 2016-09-24 MED ORDER — OXYBUTYNIN CHLORIDE ER 5 MG PO TB24
5.0000 mg | ORAL_TABLET | Freq: Every day | ORAL | Status: DC
  Administered 2016-09-24 – 2016-09-26 (×3): 5 mg via ORAL
  Filled 2016-09-24 (×4): qty 1

## 2016-09-24 MED ORDER — SODIUM CHLORIDE 0.9 % IV SOLN
INTRAVENOUS | Status: DC
  Administered 2016-09-24 – 2016-09-25 (×3): via INTRAVENOUS

## 2016-09-24 MED ORDER — GABAPENTIN 300 MG PO CAPS
300.0000 mg | ORAL_CAPSULE | Freq: Three times a day (TID) | ORAL | Status: DC
  Administered 2016-09-24 – 2016-09-25 (×3): 300 mg via ORAL
  Filled 2016-09-24 (×3): qty 1

## 2016-09-24 MED ORDER — DIAZEPAM 5 MG PO TABS
5.0000 mg | ORAL_TABLET | Freq: Two times a day (BID) | ORAL | Status: DC | PRN
  Administered 2016-09-24 – 2016-09-27 (×5): 5 mg via ORAL
  Filled 2016-09-24 (×5): qty 1

## 2016-09-24 MED ORDER — INSULIN ASPART 100 UNIT/ML ~~LOC~~ SOLN
0.0000 [IU] | Freq: Three times a day (TID) | SUBCUTANEOUS | Status: DC
  Administered 2016-09-25: 4 [IU] via SUBCUTANEOUS
  Administered 2016-09-25: 7 [IU] via SUBCUTANEOUS
  Administered 2016-09-25: 3 [IU] via SUBCUTANEOUS
  Administered 2016-09-26: 11 [IU] via SUBCUTANEOUS
  Administered 2016-09-26 – 2016-09-27 (×2): 7 [IU] via SUBCUTANEOUS
  Administered 2016-09-27: 3 [IU] via SUBCUTANEOUS

## 2016-09-24 MED ORDER — FENTANYL CITRATE (PF) 100 MCG/2ML IJ SOLN
INTRAMUSCULAR | Status: AC
  Administered 2016-09-24: 50 ug via INTRAVENOUS
  Filled 2016-09-24: qty 2

## 2016-09-24 MED ORDER — IOPAMIDOL (ISOVUE-300) INJECTION 61%
INTRAVENOUS | Status: AC
  Administered 2016-09-25: 30 mL
  Filled 2016-09-24: qty 30

## 2016-09-24 MED ORDER — METHOCARBAMOL 500 MG PO TABS
750.0000 mg | ORAL_TABLET | Freq: Three times a day (TID) | ORAL | Status: DC | PRN
  Administered 2016-09-24 – 2016-09-27 (×8): 750 mg via ORAL
  Filled 2016-09-24 (×8): qty 2

## 2016-09-24 MED ORDER — FENTANYL CITRATE (PF) 100 MCG/2ML IJ SOLN
50.0000 ug | INTRAMUSCULAR | Status: DC | PRN
  Administered 2016-09-24: 50 ug via INTRAVENOUS

## 2016-09-24 MED ORDER — VANCOMYCIN HCL 10 G IV SOLR: 1500.0000 mg | INTRAVENOUS | Status: DC

## 2016-09-24 MED ORDER — ENOXAPARIN SODIUM 40 MG/0.4ML ~~LOC~~ SOLN
40.0000 mg | SUBCUTANEOUS | Status: DC
  Administered 2016-09-24 – 2016-09-25 (×2): 40 mg via SUBCUTANEOUS
  Filled 2016-09-24 (×2): qty 0.4

## 2016-09-24 NOTE — H&P (Signed)
Family Medicine Teaching [facility] Admission History and Physical Service Pager: [phone]  Patient name: Barbara [name] Medical record number: [id number] Date of birth: 1955/08/08 Age: 61 y.o. Gender: female  Primary Care Provider: Moses Manners, MD Consultants: none Code Status: DNR (obtained admission)  Chief Complaint: Abdominal pain, fogginess  Assessment and Plan: Barbara [name] is a 61 y.o. female presenting with abdominal pain, "fogginess".  PMH is significant for multiple sclerosis with spastic paraplegia, chronic prednisone use, indwelling chronic Foley, left lower extremity cellulitis, HTN, T2DM.  Abdominal pain/nausea: Unclear etiology. She is diffusely tender to palpation. No rebound. No Murphy signs. Has fever, mild leukocytosis, nausea and ?diarrhea suggestive for gastroenteritis. Denies melena or hematochezia. She also have chronic indwelling Foley and UA concerning for UTI/pyelonephritis. Others on differential diagnosis are cholecystitis/cholelithiasis (but LFT and alkaline phosphatase within normal limits and she has no Murphy sign either), pancreatitis (less likely but will check lipase), appendicitis and colitis. She also had history of multiple sclerosis which could be contributing to her pain. Received vancomycin and ceftriaxone in ED for cellulitis and UTI respectively.  -Admit to MedSurg. Attending Dr. Jennette Kettle. -Add lipase to rule out pancreatitis -CT abdomen/pelvis with oral contrast -Manage cellulitis and UTI below -Continue Vanc and ceftriaxone  LLE Cellulitis:  patient with fever, mild leukocytosis (although on the steroid), LLE skin erythema and lesion. Surprisingly no increased warmth to touch. She also have some right lower extremity skin erythema and swelling. - We'll continue vancomycin and ceftriaxone for now.  - Follow up cultures and narrow antibiotics - Wound care consult for LLE wound - Continue IV fluid resuscitation - stress dose steroids  as adrenal insufficiency may be contributing to hypotension.  UTI/? Pyelonephritis: patient with chronic indwelling Foley catheter for urinary retention. UA suggestive for UTI. She has fever, leukocytosis (on steroid), abdominal pain and new back pain concerning for pyelonephritis. -CT abdomen as above -Continue ceftriaxone -Follow up urine culture  Multiple Sclerosis with spastic paraplegia on chronic steroids:  followed by [facility] neurology. Was previously on immune modulator, but was unable to afford this after becoming disabled.  - Continue home Valium, gabapentin,, Robaxin, Tramadol - Continue home oxybutynin for neurogenic bladder - Continue prednisone 30 mg daily  HTN: Currently normotensive.  - hold home lasix, lisinopril - Continue home aspirin  ? Adrenal insufficiency: History of this. She is normotensive here. She is on chronic steroid. She has mildly elevated potassium as well -Continue prednisone 30 mg daily as above  T2DM:  on Lantus 10 units nightly and Novolog 5 units TID ac at home  -SSI-resistant -check A1c  FEN/GI:  -Carb modified  Prophylaxis: Lovenox  Disposition: Admit to MedSurg for further evaluation and management  History of Present Illness:  Barbara [name] is a 61 y.o. female presenting with abdominal pain, fogginess, back pain and leg pain.   Patient reports abdominal pain for 3 days and feeling "foggy" for about 4 days. Abdominal pain is achy and diffuse. She also reports nausea but denies emesis. She admits some loose bowels. Denies melena or hematochezia. Admits to dysuria. She is also on chronic indwelling Foley. Reports back pain that is different from her chronic pain.  She will also have chronic venous insufficiency/swelling of her lower extremities. She admits left leg ulcer. She was treated with doxycycline for this.   She denies fever, headache, vision changes, chest pain, shortness of breath. She is on 1.5 L oxygen at home as needed for  shortness of breath. Denies new medications.   She  lives alone. She has a home Geneticist, molecular. She uses hoyer to get around. Denies smoking, drinking alcohol or recreational drug use  ED course: Vital signs remarkable for fever to 100.4. Tachycardic to 100s. Blood pressure within normal limits. CBC with mild leukocytosis at 12.8. CMP with hyperkalemia to 5.2. Serum creatinine 1.27. Glucose 302. Otherwise CMP within normal limits. ABG suggestive for respiratory acidosis. Lactic acid negative. Initial troponin negative. EKG was sinus tachycardia but no ischemic changes. UA suggestive for UTI but obtained from indwelling Foley catheter. CXR without acute cardiopulmonary finding.   Review Of Systems: Per HPI with the following additions  Review of Systems  Constitutional: Positive for fever and malaise/fatigue. Negative for chills and diaphoresis.  HENT: Negative for sore throat.   Eyes: Negative for blurred vision.  Respiratory: Negative for cough and shortness of breath.   Cardiovascular: Positive for leg swelling. Negative for chest pain, palpitations and PND.  Gastrointestinal: Positive for diarrhea and nausea. Negative for blood in stool, melena and vomiting.  Genitourinary: Positive for dysuria.  Musculoskeletal: Positive for back pain.  Neurological: Negative for focal weakness and headaches.  Psychiatric/Behavioral: Negative for substance abuse.    Patient Active Problem List   Diagnosis Date Noted  . Cellulitis 09/24/2016  . Shortness of breath   . Hypoxia   . Acute cystitis without hematuria   . Septic shock (HCC)   . Sepsis due to cellulitis (HCC) 07/16/2016  . Cellulitis of left lower extremity   . Lower extremity weakness 04/02/2016  . Decubitus ulcer of sacral region   . Impaired mobility and activities of daily living 03/07/2016  . Palliative care encounter 03/07/2016  . Current chronic use of systemic steroids 03/07/2016  . DM type 2, not at goal [facility]) 03/06/2016  .  Cellulitis and abscess of leg 08/28/2015  . MS (multiple sclerosis) (HCC)   . Weakness of both lower extremities   . Absence of bladder continence   . Essential hypertension   . Spastic paraplegia   . Weakness 07/12/2015  . Encounter for chronic pain management 04/21/2014  . Chronic venous insufficiency 11/05/2012  . Spinal stenosis in cervical region 06/03/2011  . Multiple sclerosis (HCC) 12/07/2010  . 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 Spinal stenosis of lumbar region 12/07/2010  . Hypertension 12/07/2010  . Obesity 12/07/2010  . Chronic gout 12/07/2010    Past Medical History: Past Medical History:  Diagnosis Date  . Diabetes mellitus without complication (HCC)   . Gout   . Hypertension   . Lower back pain   . Lumbar spondylosis   . MS (multiple sclerosis) (HCC)   . Numbness   . Spinal stenosis   . Weakness     Past Surgical History: Past Surgical History:  Procedure Laterality Date  . CERVICAL DISC SURGERY     Qusetions C5-6 C6-7 and C7-T1    Social History: Social History  Substance Use Topics  . Smoking status: Never Smoker  . Smokeless tobacco: Never Used  . Alcohol use No   Additional social history:   Please also refer to relevant sections of EMR.  Family History: Family History  Problem Relation Age of Onset  . Heart disease Mother   . Diabetes Mother   . Cancer Father    (If not completed, MUST add something in)  Allergies and Medications: Allergies  Allergen Reactions  . Metformin And Related Diarrhea and Nausea And Vomiting    GI intolerance   No current facility-administered medications on file prior to  encounter.    Current Outpatient Prescriptions on File Prior to Encounter  Medication Sig Dispense Refill  . aspirin EC 325 MG tablet Take 325 mg by mouth daily.    . calcium carbonate (TUMS - DOSED IN MG ELEMENTAL CALCIUM) 500 MG chewable tablet Chew 1 tablet by mouth as needed for indigestion or heartburn.    . Cholecalciferol (VITAMIN  D3) 1000 units CAPS Take 4,000 Units by mouth daily.    . Coenzyme Q10 (CO Q 10 PO) Take 1 capsule by mouth daily.     . colchicine 0.6 MG tablet Take 0.6 mg by mouth daily as needed (gout).     . Cyanocobalamin (VITAMIN B-12 PO) Take 1 tablet by mouth daily.    . diazepam (VALIUM) 5 MG tablet TAKE 1 TABLET BY MOUTH EVERY 6 HOURS AS NEEDED FOR LEG SPASMS 90 tablet 5  . furosemide (LASIX) 40 MG tablet Take 1 tablet (40 mg total) by mouth daily. 30 tablet 0  . gabapentin (NEURONTIN) 300 MG capsule TAKE 1 CAPSULE BY MOUTH 3 TIMES DAILY. 270 capsule 3  . glucose blood test strip Test blood sugar three times per day. 100 each 12  . insulin aspart (NOVOLOG) 100 UNIT/ML FlexPen Inject 5 Units into the skin 3 (three) times daily with meals as needed for high blood sugar (if CBG > 200). (Patient taking differently: Inject 5 Units into the skin 3 (three) times daily as needed for high blood sugar (if BGL is 200 or greater). ) 15 mL 11  . Insulin Glargine (LANTUS) 100 UNIT/ML Solostar Pen Inject 10 Units into the skin daily at 10 pm. (Patient taking differently: Inject 10 Units into the skin at bedtime. ) 15 mL 11  . Insulin Pen Needle 31G X 8 MM MISC Use to inject insulin 4x/day 300 each 6  . lisinopril (PRINIVIL,ZESTRIL) 5 MG tablet Take 1 tablet (5 mg total) by mouth daily. 30 tablet 1  . Magnesium 250 MG TABS Take 250 mg by mouth daily.    . methocarbamol (ROBAXIN) 750 MG tablet TAKE 2 TABLETS (1,500 MG TOTAL) BY MOUTH 3 (THREE) TIMES DAILY. (Patient taking differently: Take 750 mg by mouth 6 (six) times daily. ) 540 tablet 3  . Multiple Vitamins-Minerals (MULTIVITAMIN PO) Take 1 tablet by mouth daily.    Marland Kitchen oxybutynin (DITROPAN-XL) 5 MG 24 hr tablet Take 1 tablet (5 mg total) by mouth at bedtime. 90 tablet 4  . predniSONE (DELTASONE) 10 MG tablet Take 3 tablets (30 mg total) by mouth daily with breakfast. Take 40mg  3/20-22, then 30mg  every day. (Patient taking differently: Take 20 mg by mouth daily with  breakfast. ) 270 tablet 3  . traMADol (ULTRAM) 50 MG tablet TAKE 1 TABLET BY MOUTH 4 TIMES DAILY 360 tablet 1  . trolamine salicylate (ASPERCREME) 10 % cream Apply 1 application topically as needed for muscle pain.    Marland Kitchen acetaminophen (TYLENOL) 325 MG tablet Take 2 tablets (650 mg total) by mouth every 6 (six) hours as needed (mild pain, fever >100.4). (Patient not taking: Reported on 09/24/2016) 30 tablet 0  . collagenase (SANTYL) ointment Apply topically daily. (Patient not taking: Reported on 07/16/2016) 15 g 0  . dicyclomine (BENTYL) 20 MG tablet Take 1 tablet (20 mg total) by mouth 3 (three) times daily as needed for spasms (abdominal pain). (Patient not taking: Reported on 09/24/2016) 20 tablet 0  . docusate sodium (COLACE) 100 MG capsule Take 1 capsule (100 mg total) by mouth 2 (two) times daily. (  Patient not taking: Reported on 08/06/2016) 10 capsule 0  . doxycycline (VIBRA-TABS) 100 MG tablet Take 1 tablet (100 mg total) by mouth 2 (two) times daily. (Patient not taking: Reported on 09/24/2016) 28 tablet 0  . ibuprofen (ADVIL,MOTRIN) 400 MG tablet Take 1 tablet (400 mg total) by mouth every 6 (six) hours as needed for headache or moderate pain. 30 tablet 0  . polyethylene glycol (MIRALAX / GLYCOLAX) packet Take 17 g by mouth daily. (Patient not taking: Reported on 08/06/2016) 14 each 0    Objective: BP 126/88   Pulse (!) 108   Temp (!) 100.4 F (38 C) (Rectal)   Resp 11   Ht 5\' 3"  (1.6 m)   Wt 279 lb (126.6 kg)   LMP 10/07/2010   SpO2 95%   BMI 49.42 kg/m  Exam: GEN: Morbidly obese, lying in bed, no apparent distress. Head: normocephalic and atraumatic  Eyes: conjunctiva without injection, sclera anicteri CVS: RRR, nl s1 & s2, no murmurs, no edema RESP: speaks in full sentence, no IWOB, limited exam due to body habitus GI: BS present & normal, soft, diffusely tender to palpation all over, no rebound, no guarding, no Murphy's sign GU: Foley catheter in place. Clear looking urine in a  bag MSK: Bilateral lower extremity swelling SKIN: Some erythema in her LE's (left > right), no increased warmth to touch, some superficial skin lesion on the posterior calf in the left and over her left big toe. See picture for more       NEURO: alert and oiented appropriately, not able to move her lower extremities PSYCH: euthymic mood with congruent affect  Labs and Imaging: CBC BMET   Recent Labs Lab 09/24/16 1520 09/24/16 1536  WBC 12.8*  --   HGB 11.6* 11.6*  HCT 36.2 34.0*  PLT 204  --     Recent Labs Lab 09/24/16 1520 09/24/16 1536  NA 136 135  K 5.2* 5.1  CL 99* 98*  CO2 27  --   BUN 67* 73*  CREATININE 1.27* 1.40*  GLUCOSE 302* 300*  CALCIUM 9.3  --      Dg Chest Port 1 View  Result Date: 09/24/2016 CLINICAL DATA:  Severe upper abdominal pain. EXAM: PORTABLE CHEST 1 VIEW COMPARISON:  07/22/2016 FINDINGS: Chronic cardiomegaly and vascular pedicle widening. Mediastinal widening is from hypertrophic fat by recent CT. There is no edema, consolidation, effusion, or pneumothorax. No visible pneumoperitoneum. IMPRESSION: Stable from prior.  No evidence of acute disease. Electronically Signed   By: Marnee Spring M.D.   On: 09/24/2016 16:25    Almon Hercules, MD 09/24/2016, 8:10 PM PGY-2, [place] [facility] FPTS Intern pager: [phone], text pages welcome

## 2016-09-24 NOTE — Progress Notes (Signed)
New Admission Note:  Arrival Method: Stretcher from ED Mental Orientation: A&O x4 Telemetry: Box 7, CCMD notified.  Assessment: Completed Skin: Assessed with Burley Saver, RN, check flowsheets IV: R and L AC Pain: 0/10 Tubes: Foley Catheter Safety Measures: Safety Fall Prevention Plan was given, discussed and signed by patient Admission: Completed [address] Orientation: Patient has been orientated to the room, unit and the staff. Family: Sister at bedside.  Orders have been reviewed and implemented. Will continue to monitor the patient. Call light has been placed within reach and bed alarm has been activated.   Rivka Barbara BSN, RN  Phone Number: [phone]

## 2016-09-24 NOTE — ED Notes (Signed)
Yellow DNR form sent upstairs with patient.

## 2016-09-24 NOTE — Progress Notes (Signed)
Pharmacy Antibiotic Note  Barbara [name] is a 61 y.o. female admitted on 09/24/2016 with sepsis 2/2 cellulitis.  Pharmacy has been consulted for vancomycin dosing. Patient is afebrile, WBC 12.8, normalized CrCl ~ 49 ml/min  Plan: Vancomycin 2500 mg IV x1 then 1500 mg IV every 24 hours.  Goal trough 15-20 mcg/mL.  Ceftriaxone 2g IV x1 per MD Monitor renal function, cultures, vanc trough at steady state  Height: 5\' 3"  (160 cm) Weight: 279 lb (126.6 kg) IBW/kg (Calculated) : 52.4  Temp (24hrs), Avg:98.6 F (37 C), Min:98.6 F (37 C), Max:98.6 F (37 C)   Recent Labs Lab 09/24/16 1520 09/24/16 1536 09/24/16 1537  WBC 12.8*  --   --   CREATININE 1.27* 1.40*  --   LATICACIDVEN  --   --  1.46    Estimated Creatinine Clearance: 55.4 mL/min (A) (by C-G formula based on SCr of 1.4 mg/dL (H)).    Allergies  Allergen Reactions  . Metformin And Related Diarrhea and Nausea And Vomiting    GI intolerance    Antimicrobials this admission: 5/22 vanc >>  5/22 ceftriaxone x1  Dose adjustments this admission: n/a  Microbiology results: 5/22 BCx: sent  Thank you for allowing pharmacy to be a part of this patient's care.  Mackie Pai, PharmD PGY1 Pharmacy Resident Rx ED [phone] 09/24/2016 4:48 PM

## 2016-09-24 NOTE — ED Triage Notes (Signed)
Pt arrives from home via [facility] reporting BLE and abd pain increasing over past 2-3 days.  EMS reports darker than normal urine, cath in place.  Pt reports sores on L posterior calf, BLE wrapped in wound dressing from toes to knees.  EMS reports O2 82% RA, improved to 93% on 4L.  Pt in distress, c/o pain in BLE.

## 2016-09-24 NOTE — ED Notes (Signed)
Patient's sister notified this RN that pt has urethral catheters changed at home and the one done before this was done wrong, according to the home RN who had changed it yesterday. Upon taking that catheter out, the RN told the pt and her sister that it wasn't put in far enough and may have caused some trauma. He then put another catheter in yesterday. Per MD, plan to hold temp foley based on this information and pending labs.

## 2016-09-24 NOTE — ED Notes (Signed)
Dr. Rhunette Croft not in office gave Lactic result to RN Chrislyn

## 2016-09-24 NOTE — ED Provider Notes (Signed)
[facility]-EMER[facility]ENCY DEPT Provider Note   CSN: [id number] Arrival date & time: 09/24/16  1446     History   Chief Complaint Chief Complaint  Patient presents with  . Abdominal Pain  . Leg Pain    HPI Barbara [name] is a 61 y.o. female.  Patient with chronic foley catheter and chronic ulcers on legs and low back. Patient with home nurse who helps with wound changes. Patient just finished course of antibiotics with doxycycline.    The history is provided by the patient.  Abdominal Pain   This is a new problem. The current episode started 2 days ago. The problem occurs daily. The problem has not changed since onset.The pain is associated with an unknown factor. The pain is located in the generalized abdominal region. The quality of the pain is aching and dull. The pain is at a severity of 5/10. The pain is moderate. Associated symptoms include nausea. Pertinent negatives include anorexia, fever, belching, diarrhea, hematochezia, melena, vomiting, constipation, dysuria, frequency, hematuria, headaches and arthralgias. The symptoms are aggravated by palpation. Nothing relieves the symptoms. Past medical history comments: MS and bed bound, DM, chronic skin ulcers.    Past Medical History:  Diagnosis Date  . Diabetes mellitus without complication (HCC)   . Gout   . Hypertension   . Lower back pain   . Lumbar spondylosis   . MS (multiple sclerosis) (HCC)   . Numbness   . Spinal stenosis   . Weakness     Patient Active Problem List   Diagnosis Date Noted  . Cellulitis 09/24/2016  . Shortness of breath   . Hypoxia   . Acute cystitis without hematuria   . Septic shock (HCC)   . Sepsis due to cellulitis (HCC) 07/16/2016  . Cellulitis of left lower extremity   . Lower extremity weakness 04/02/2016  . Decubitus ulcer of sacral region   . Impaired mobility and activities of daily living 03/07/2016  . Palliative care encounter 03/07/2016  . Current chronic use of systemic steroids  03/07/2016  . DM type 2, not at goal [facility]) 03/06/2016  . Cellulitis and abscess of leg 08/28/2015  . MS (multiple sclerosis) (HCC)   . Weakness of both lower extremities   . Absence of bladder continence   . Essential hypertension   . Spastic paraplegia   . Weakness 07/12/2015  . Encounter for chronic pain management 04/21/2014  . Chronic venous insufficiency 11/05/2012  . Spinal stenosis in cervical region 06/03/2011  . Multiple sclerosis (HCC) 12/07/2010  . 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 Spinal stenosis of lumbar region 12/07/2010  . Hypertension 12/07/2010  . Obesity 12/07/2010  . Chronic gout 12/07/2010    Past Surgical History:  Procedure Laterality Date  . CERVICAL DISC SURGERY     Qusetions C5-6 C6-7 and C7-T1    OB History    No data available       Home Medications    Prior to Admission medications   Medication Sig Start Date End Date Taking? Authorizing Provider  aspirin EC 325 MG tablet Take 325 mg by mouth daily.   Yes [provider]  calcium carbonate (TUMS - DOSED IN MG ELEMENTAL CALCIUM) 500 MG chewable tablet Chew 1 tablet by mouth as needed for indigestion or heartburn.   Yes [provider]  Cholecalciferol (VITAMIN D3) 1000 units CAPS Take 4,000 Units by mouth daily.   Yes [provider]  Coenzyme Q10 (CO Q 10 PO) Take 1 capsule by mouth daily.  Yes [provider]  colchicine 0.6 MG tablet Take 0.6 mg by mouth daily as needed (gout).    Yes [provider]  Cyanocobalamin (VITAMIN B-12 PO) Take 1 tablet by mouth daily.   Yes [provider]  diazepam (VALIUM) 5 MG tablet TAKE 1 TABLET BY MOUTH EVERY 6 HOURS AS NEEDED FOR LEG SPASMS 05/30/16  Yes Hensel, Santiago Bumpers, MD  furosemide (LASIX) 40 MG tablet Take 1 tablet (40 mg total) by mouth daily. 07/23/16  Yes Garth Bigness, MD  gabapentin (NEURONTIN) 300 MG capsule TAKE 1 CAPSULE BY MOUTH 3 TIMES DAILY. 06/10/16  Yes Hensel, Santiago Bumpers, MD  glucose  blood test strip Test blood sugar three times per day. 05/14/16  Yes Hensel, Santiago Bumpers, MD  insulin aspart (NOVOLOG) 100 UNIT/ML FlexPen Inject 5 Units into the skin 3 (three) times daily with meals as needed for high blood sugar (if CBG > 200). Patient taking differently: Inject 5 Units into the skin 3 (three) times daily as needed for high blood sugar (if BGL is 200 or greater).  07/22/16  Yes Garth Bigness, MD  Insulin Glargine (LANTUS) 100 UNIT/ML Solostar Pen Inject 10 Units into the skin daily at 10 pm. Patient taking differently: Inject 10 Units into the skin at bedtime.  09/16/16  Yes Hensel, Santiago Bumpers, MD  Insulin Pen Needle 31G X 8 MM MISC Use to inject insulin 4x/day 05/16/16  Yes Hensel, Santiago Bumpers, MD  lisinopril (PRINIVIL,ZESTRIL) 5 MG tablet Take 1 tablet (5 mg total) by mouth daily. 07/22/16  Yes Garth Bigness, MD  Magnesium 250 MG TABS Take 250 mg by mouth daily.   Yes [provider]  methocarbamol (ROBAXIN) 750 MG tablet TAKE 2 TABLETS (1,500 MG TOTAL) BY MOUTH 3 (THREE) TIMES DAILY. Patient taking differently: Take 750 mg by mouth 6 (six) times daily.  12/14/15  Yes Hensel, Santiago Bumpers, MD  Multiple Vitamins-Minerals (MULTIVITAMIN PO) Take 1 tablet by mouth daily.   Yes [provider]  oxybutynin (DITROPAN-XL) 5 MG 24 hr tablet Take 1 tablet (5 mg total) by mouth at bedtime. 12/05/15  Yes Penumalli, Glenford Bayley, MD  predniSONE (DELTASONE) 10 MG tablet Take 3 tablets (30 mg total) by mouth daily with breakfast. Take 40mg  3/20-22, then 30mg  every day. Patient taking differently: Take 20 mg by mouth daily with breakfast.  07/22/16  Yes Garth Bigness, MD  traMADol (ULTRAM) 50 MG tablet TAKE 1 TABLET BY MOUTH 4 TIMES DAILY 07/25/16  Yes Hensel, Santiago Bumpers, MD  trolamine salicylate (ASPERCREME) 10 % cream Apply 1 application topically as needed for muscle pain.   Yes [provider]  acetaminophen (TYLENOL) 325 MG tablet Take 2 tablets (650 mg total) by mouth  every 6 (six) hours as needed (mild pain, fever >100.4). Patient not taking: Reported on 09/24/2016 04/05/16   Tillman Sers, DO  collagenase (SANTYL) ointment Apply topically daily. Patient not taking: Reported on 07/16/2016 04/05/16   Tillman Sers, DO  dicyclomine (BENTYL) 20 MG tablet Take 1 tablet (20 mg total) by mouth 3 (three) times daily as needed for spasms (abdominal pain). Patient not taking: Reported on 09/24/2016 03/15/16   Trixie Dredge, PA-C  docusate sodium (COLACE) 100 MG capsule Take 1 capsule (100 mg total) by mouth 2 (two) times daily. Patient not taking: Reported on 08/06/2016 07/19/16   Garth Bigness, MD  doxycycline (VIBRA-TABS) 100 MG tablet Take 1 tablet (100 mg total) by mouth 2 (two) times daily. Patient not taking: Reported on  09/24/2016 09/02/16   Moses Manners, MD  ibuprofen (ADVIL,MOTRIN) 400 MG tablet Take 1 tablet (400 mg total) by mouth every 6 (six) hours as needed for headache or moderate pain. 04/05/16   Tillman Sers, DO  polyethylene glycol (MIRALAX / GLYCOLAX) packet Take 17 g by mouth daily. Patient not taking: Reported on 08/06/2016 07/20/16   Garth Bigness, MD    Family History Family History  Problem Relation Age of Onset  . Heart disease Mother   . Diabetes Mother   . Cancer Father     Social History Social History  Substance Use Topics  . Smoking status: Never Smoker  . Smokeless tobacco: Never Used  . Alcohol use No     Allergies   Metformin and related   Review of Systems Review of Systems  Constitutional: Negative for chills and fever.  HENT: Negative for ear pain and sore throat.   Eyes: Negative for pain and visual disturbance.  Respiratory: Negative for cough and shortness of breath.   Cardiovascular: Positive for leg swelling (and pain). Negative for chest pain and palpitations.  Gastrointestinal: Positive for abdominal pain and nausea. Negative for abdominal distention, anorexia, constipation, diarrhea,  hematochezia, melena and vomiting.  Genitourinary: Negative for dysuria, frequency and hematuria.  Musculoskeletal: Positive for gait problem. Negative for arthralgias and back pain.  Skin: Positive for wound. Negative for color change and rash.  Neurological: Positive for weakness (in legs, chronic). Negative for seizures, syncope and headaches.  All other systems reviewed and are negative.    Physical Exam Updated Vital Signs  ED Triage Vitals  Enc Vitals Group     BP --      Pulse --      Resp --      Temp 09/24/16 1452 98.6 F (37 C)     Temp Source 09/24/16 1452 Oral     SpO2 09/24/16 1447 93 %     Weight 09/24/16 1452 279 lb (126.6 kg)     Height 09/24/16 1452 5\' 3"  (1.6 m)     Head Circumference --      Peak Flow --      Pain Score 09/24/16 1450 10     Pain Loc --      Pain Edu? --      Excl. in GC? --     Physical Exam  Constitutional: She is oriented to person, place, and time. She appears distressed.  Morbidly obese  HENT:  Head: Normocephalic and atraumatic.  Eyes: Conjunctivae and EOM are normal. Pupils are equal, round, and reactive to light.  Neck: Normal range of motion. Neck supple.  Cardiovascular: Normal rate, regular rhythm, normal heart sounds and intact distal pulses.   Pulmonary/Chest: Effort normal and breath sounds normal. No respiratory distress.  Abdominal: Soft. She exhibits no mass. There is tenderness (diffusely). There is no guarding.  B/l CVA tenderness  Musculoskeletal: She exhibits edema (2+ edema in b/l LE).  Neurological: She is alert and oriented to person, place, and time.  Skin: Skin is warm and dry.  Skin tear to back of left calf  Psychiatric: She has a normal mood and affect.  Nursing note and vitals reviewed.    ED Treatments / Results  Labs (all labs ordered are listed, but only abnormal results are displayed) Labs Reviewed  COMPREHENSIVE METABOLIC PANEL - Abnormal; Notable for the following:       Result Value    Potassium 5.2 (*)    Chloride 99 (*)  Glucose, Bld 302 (*)    BUN 67 (*)    Creatinine, Ser 1.27 (*)    Albumin 3.2 (*)    GFR calc non Af Amer 45 (*)    GFR calc Af Amer 52 (*)    All other components within normal limits  CBC WITH DIFFERENTIAL/PLATELET - Abnormal; Notable for the following:    WBC 12.8 (*)    Hemoglobin 11.6 (*)    RDW 18.3 (*)    Neutro Abs 11.3 (*)    All other components within normal limits  URINALYSIS, ROUTINE W REFLEX MICROSCOPIC - Abnormal; Notable for the following:    APPearance CLOUDY (*)    pH 9.0 (*)    Hgb urine dipstick MODERATE (*)    Nitrite POSITIVE (*)    Leukocytes, UA SMALL (*)    Bacteria, UA MANY (*)    All other components within normal limits  GLUCOSE, CAPILLARY - Abnormal; Notable for the following:    Glucose-Capillary 232 (*)    All other components within normal limits  I-STAT CHEM 8, ED - Abnormal; Notable for the following:    Chloride 98 (*)    BUN 73 (*)    Creatinine, Ser 1.40 (*)    Glucose, Bld 300 (*)    Hemoglobin 11.6 (*)    HCT 34.0 (*)    All other components within normal limits  I-STAT VENOUS BLOOD GAS, ED - Abnormal; Notable for the following:    pCO2, Ven 65.8 (*)    pO2, Ven 23.0 (*)    Bicarbonate 33.8 (*)    Acid-Base Excess 6.0 (*)    All other components within normal limits  CULTURE, BLOOD (ROUTINE X 2)  CULTURE, BLOOD (ROUTINE X 2)  URINE CULTURE  LIPASE, BLOOD  MAGNESIUM  LIPASE, BLOOD  COMPREHENSIVE METABOLIC PANEL  CBC  HEMOGLOBIN A1C  I-STAT CG4 LACTIC ACID, ED  I-STAT TROPOININ, ED    EKG  EKG Interpretation  Date/Time:  Tuesday Sep 24 2016 14:46:43 EDT Ventricular Rate:  111 PR Interval:    QRS Duration: 90 QT Interval:  279 QTC Calculation: 379 R Axis:   87 Text Interpretation:  Sinus tachycardia Low voltage, extremity and precordial leads Consider anterior infarct Minimal ST depression, lateral leads No acute changes No significant change since last tracing Confirmed by  Derwood Kaplan ([id number]) on 09/24/2016 3:32:10 PM       Radiology Dg Chest Port 1 View  Result Date: 09/24/2016 CLINICAL DATA:  Severe upper abdominal pain. EXAM: PORTABLE CHEST 1 VIEW COMPARISON:  07/22/2016 FINDINGS: Chronic cardiomegaly and vascular pedicle widening. Mediastinal widening is from hypertrophic fat by recent CT. There is no edema, consolidation, effusion, or pneumothorax. No visible pneumoperitoneum. IMPRESSION: Stable from prior.  No evidence of acute disease. Electronically Signed   By: Marnee Spring M.D.   On: 09/24/2016 16:25    Procedures Procedures (including critical care time)  Medications Ordered in ED Medications  vancomycin (VANCOCIN) 1,500 mg in sodium chloride 0.9 % 500 mL IVPB (not administered)  traMADol (ULTRAM) tablet 50 mg (not administered)  gabapentin (NEURONTIN) capsule 300 mg (300 mg Oral Given 09/24/16 2114)  diazepam (VALIUM) tablet 5 mg (5 mg Oral Given 09/24/16 1955)  methocarbamol (ROBAXIN) tablet 750 mg (750 mg Oral Given 09/24/16 2235)  oxybutynin (DITROPAN-XL) 24 hr tablet 5 mg (5 mg Oral Given 09/24/16 2114)  aspirin EC tablet 325 mg (325 mg Oral Given 09/24/16 2052)  enoxaparin (LOVENOX) injection 40 mg (40 mg Subcutaneous Given 09/24/16 2114)  0.9 %  sodium chloride infusion ( Intravenous New Bag/Given 09/24/16 2052)  insulin aspart (novoLOG) injection 0-20 Units (not administered)  iopamidol (ISOVUE-300) 61 % injection (not administered)  sodium chloride 0.9 % bolus 1,000 mL (0 mLs Intravenous Stopped 09/24/16 1748)  cefTRIAXone (ROCEPHIN) 2 g in dextrose 5 % 50 mL IVPB (0 g Intravenous Stopped 09/24/16 1744)  vancomycin (VANCOCIN) 2,500 mg in sodium chloride 0.9 % 500 mL IVPB (2,500 mg Intravenous Transfusing/Transfer 09/24/16 2012)  traMADol (ULTRAM) tablet 50 mg (50 mg Oral Given 09/24/16 1826)     Initial Impression / Assessment and Plan / ED Course  I have reviewed the triage vital signs and the nursing notes.  Pertinent labs & imaging  results that were available during my care of the patient were reviewed by me and considered in my medical decision making (see chart for details).     Fiza Nation is a 61 year old female with history of MS, diabetes, hypertension, chronic indwelling Foley catheter, history of lower leg cellulitis and just finish a course of doxycycline. Patient's vitals at time of arrival to the ED are significant for tachycardia and fever. Concern for sepsis given chronic indwelling Foley catheter and recent treatment of cellulitis. Patient states that she wears about 2 L of oxygen at times for comfort which she has been placed on. Patient states that she has diffuse abdominal pain and bilateral lower leg pain. Patient has prior cellulitis to left foot and has known sacral ulcer. Patient denies cough, sputum production. Patient has had multiple urinary tract infections in the past. Exam is significant for diffuse abdominal tenderness, patient appears to have well-healing ulcers of left foot and sacrum with no real breakage of skin. They appear to be abrasions at this point. Bilateral lower extremities are edematous. Sepsis workup ordered including blood culture, urinalysis, chest x-ray. Patient given normal saline bolus. Likely urinary source given chronic foley catheter.  Patient with positive nitrates and leukocytes and urinalysis. Patient started on vancomycin and rocephin for UTI and to cover for any ongoing cellulitis as well. Patient with chest x-ray with no signs of pneumonia, no pneumothorax, no pleural effusion. EKG shows sinus tachycardia with no signs of ischemic changes and tachycardia improved following normal saline bolus. Patient with troponin within normal limits and doubt any cardiac issues. Patient with mild elevation in kidney function likely from dehydration. Otherwise patient with unremarkable electrolytes with no significant anemia or electrolyte abnormality. Patient with elevated glucose but no  signs on labs to suggest DKA. Patient did have mild leukocytosis but no lactic acidosis and patient with sepsis from UTI. Patient admitted to medicine service and transfered to floor in stable condition.  Final Clinical Impressions(s) / ED Diagnoses   Final diagnoses:  Cystitis  Sepsis, due to unspecified organism [facility])  AKI (acute kidney injury) (HCC)  Leukocytosis, unspecified type    New Prescriptions Current Discharge Medication List       Virgina Norfolk, DO 09/24/16 2246    Derwood Kaplan, MD 09/25/16 [phone]

## 2016-09-24 NOTE — ED Notes (Signed)
RN went into patient's room to update on bed status and patient is crying while her sister attempts to make her more comfortable. RN attempts repositioning without relief. Paged admitting MD regarding pain medication.

## 2016-09-25 ENCOUNTER — Inpatient Hospital Stay (HOSPITAL_COMMUNITY): Payer: PPO

## 2016-09-25 DIAGNOSIS — D72829 Elevated white blood cell count, unspecified: Secondary | ICD-10-CM

## 2016-09-25 DIAGNOSIS — R10819 Abdominal tenderness, unspecified site: Secondary | ICD-10-CM

## 2016-09-25 DIAGNOSIS — L03116 Cellulitis of left lower limb: Secondary | ICD-10-CM

## 2016-09-25 DIAGNOSIS — R109 Unspecified abdominal pain: Secondary | ICD-10-CM

## 2016-09-25 DIAGNOSIS — N179 Acute kidney failure, unspecified: Secondary | ICD-10-CM

## 2016-09-25 LAB — COMPREHENSIVE METABOLIC PANEL
ALT: 20 U/L (ref 14–54)
ANION GAP: 11 (ref 5–15)
AST: 29 U/L (ref 15–41)
Albumin: 2.7 g/dL — ABNORMAL LOW (ref 3.5–5.0)
Alkaline Phosphatase: 63 U/L (ref 38–126)
BUN: 59 mg/dL — ABNORMAL HIGH (ref 6–20)
CHLORIDE: 100 mmol/L — AB (ref 101–111)
CO2: 27 mmol/L (ref 22–32)
Calcium: 8.8 mg/dL — ABNORMAL LOW (ref 8.9–10.3)
Creatinine, Ser: 0.98 mg/dL (ref 0.44–1.00)
GFR calc non Af Amer: >60 mL/min (ref >60)
Glucose, Bld: 125 mg/dL — ABNORMAL HIGH (ref 65–99)
Potassium: 4.1 mmol/L (ref 3.5–5.1)
SODIUM: 138 mmol/L (ref 135–145)
Total Bilirubin: 0.8 mg/dL (ref 0.3–1.2)
Total Protein: 5.7 g/dL — ABNORMAL LOW (ref 6.5–8.1)

## 2016-09-25 LAB — URINE CULTURE

## 2016-09-25 LAB — CBC
HEMATOCRIT: 33.8 % — AB (ref 36.0–46.0)
Hemoglobin: 10.6 g/dL — ABNORMAL LOW (ref 12.0–15.0)
MCH: 29.1 pg (ref 26.0–34.0)
MCHC: 31.4 g/dL (ref 30.0–36.0)
MCV: 92.9 fL (ref 78.0–100.0)
Platelets: 186 10*3/uL (ref 150–400)
RBC: 3.64 MIL/uL — AB (ref 3.87–5.11)
RDW: 18.6 % — ABNORMAL HIGH (ref 11.5–15.5)
WBC: 10.5 10*3/uL (ref 4.0–10.5)

## 2016-09-25 LAB — GLUCOSE, CAPILLARY
GLUCOSE-CAPILLARY: 154 mg/dL — AB (ref 65–99)
GLUCOSE-CAPILLARY: 214 mg/dL — AB (ref 65–99)
GLUCOSE-CAPILLARY: 150 mg/dL — AB (ref 65–99)
Glucose-Capillary: 178 mg/dL — ABNORMAL HIGH (ref 65–99)

## 2016-09-25 MED ORDER — IOPAMIDOL (ISOVUE-300) INJECTION 61%
INTRAVENOUS | Status: AC
  Administered 2016-09-25: 100 mL
  Filled 2016-09-25: qty 100

## 2016-09-25 MED ORDER — GABAPENTIN 300 MG PO CAPS: 600.0000 mg | ORAL_CAPSULE | Freq: Three times a day (TID) | ORAL | Status: DC

## 2016-09-25 MED ORDER — PREDNISONE 20 MG PO TABS
30.0000 mg | ORAL_TABLET | Freq: Every day | ORAL | Status: DC
  Administered 2016-09-26: 10:00:00 30 mg via ORAL
  Filled 2016-09-25: qty 1

## 2016-09-25 MED ORDER — DEXTROSE 5 % IV SOLN
1.0000 g | INTRAVENOUS | Status: DC
  Administered 2016-09-25: 1 g via INTRAVENOUS
  Filled 2016-09-25 (×2): qty 10

## 2016-09-25 MED ORDER — GABAPENTIN 600 MG PO TABS
600.0000 mg | ORAL_TABLET | Freq: Three times a day (TID) | ORAL | Status: DC
Start: 2016-09-25 — End: 2016-09-27
  Administered 2016-09-25 – 2016-09-27 (×7): 600 mg via ORAL
  Filled 2016-09-25 (×7): qty 1

## 2016-09-25 MED ORDER — VANCOMYCIN HCL 10 G IV SOLR
1250.0000 mg | Freq: Two times a day (BID) | INTRAVENOUS | Status: DC
  Administered 2016-09-25 (×2): 1250 mg via INTRAVENOUS
  Filled 2016-09-25 (×3): qty 1250

## 2016-09-25 NOTE — Progress Notes (Signed)
Family Medicine Teaching Service Daily Progress Note Intern Pager: [phone]  Patient name: Barbara [name] Medical record number: [id number] Date of birth: 08-20-1955 Age: 61 y.o. Gender: female  Primary Care Provider: Moses Manners, MD Consultants: none Code Status: DNR  Pt Overview and Major Events to Date:  5/22 admitted with UTI, ?LLE cellulitis  Assessment and Plan: Barbara Thompsonis a 61 y.o.femalepresenting with abdominal pain, "fogginess".  PMH is significant for multiple sclerosis with spastic paraplegia, chronic prednisone use, indwelling chronic Foley, left lower extremity cellulitis, HTN, T2DM.  Abdominal pain/nausea: Unclear etiology. Has fever, mild leukocytosis (though on chronic steroids), nausea and ?diarrhea suggestive for gastroenteritis. She also have chronic indwelling Foley and UA concerning for UTI/pyelonephritis. Additional possible etiology includes constipation as pain is over LLQ and patient hesitant to take bowel regimen at home due to immobility. Received vancomycin and ceftriaxone in ED for cellulitis and UTI respectively. CT abd/pelvis with nonobstructing gallstones and diffuse stool.  -Manage cellulitis and UTI below -Continue Vanc and ceftriaxone  LLE Cellulitis: patient with fever, mild leukocytosis (although on the steroid), LLE skin erythema and lesion. Surprisingly no increased warmth to touch. She also have some right lower extremity skin erythema and swelling. - continue vancomycin and ceftriaxone - Follow up cultures   UTI/? Pyelonephritis: patient with chronic indwelling Foley catheter for urinary retention. UA suggestive for UTI. She has fever, leukocytosis (on steroid), abdominal pain and new back pain concerning for pyelonephritis. -Continue ceftriaxone -Follow up urine culture  Multiple Sclerosis with spastic paraplegia on chronic steroids:followed by [facility] neurology. Was previously on immune modulator, but was unable to afford  this after becoming disabled.  - Continue home Valium, Robaxin, Tramadol - increase gabapentin to 600mg  TID  - Continue home oxybutynin for neurogenic bladder - Continue prednisone 30 mg daily  HTN: Currently normotensive.  - hold home lasix, lisinopril - Continue home aspirin  ? Adrenal insufficiency: History of this. She is normotensive here. She is on chronic steroid. She has mildly elevated potassium as well -Continue prednisone 30 mg daily as above  T2DM: on Lantus 10 units nightly and Novolog 5 units TID ac at home  -SSI-resistant -check A1c  FEN/GI:  -Carb modified  Prophylaxis: Lovenox  Disposition: continued inpatient management of infections  Subjective:  Patient talkative this morning. She reports just generally feeling unwell prior to admission. Home health RN was concerned with both possible UTI and possible cellulitis over LLE. She states she will consider discussions about placement.   Objective: Temp:  [97.5 F (36.4 C)-100.4 F (38 C)] 97.5 F (36.4 C) (05/23 1117) Pulse Rate:  [61-108] 81 (05/23 1117) Resp:  [10-25] 18 (05/23 1117) BP: (98-126)/(50-98) 103/51 (05/23 1117) SpO2:  [90 %-100 %] 93 % (05/23 1117) Weight:  [279 lb (126.6 kg)-334 lb 8 oz (151.7 kg)] 334 lb 8 oz (151.7 kg) (05/22 2026) Physical Exam: General: Obese female sitting in bed in NAD.  Cardiovascular: RRR, no murmur Respiratory: CTAB, distant breath sounds due to body habitus Abdomen: soft, protuberant, +BS, TTP over LLQ. Bruising on upper abdomen consistent with insulin dosing. No inflammation or redness of pannus.  Extremities: minimal sensation over bilateral lower extremities. LLE weeping serous fluid. Erythematous LLE as pictured previously.   Laboratory:  Recent Labs Lab 09/24/16 1520 09/24/16 1536 09/25/16 0619  WBC 12.8*  --  10.5  HGB 11.6* 11.6* 10.6*  HCT 36.2 34.0* 33.8*  PLT 204  --  186    Recent Labs Lab 09/24/16 1520 09/24/16 1536 09/25/16 7846  NA 136 135 138  K 5.2* 5.1 4.1  CL 99* 98* 100*  CO2 27  --  27  BUN 67* 73* 59*  CREATININE 1.27* 1.40* 0.98  CALCIUM 9.3  --  8.8*  PROT 6.5  --  5.7*  BILITOT 0.7  --  0.8  ALKPHOS 80  --  63  ALT 22  --  20  AST 31  --  29  GLUCOSE 302* 300* 125*   Lipase WNL  Imaging/Diagnostic Tests: Ct Abdomen Pelvis W Contrast  Result Date: 09/25/2016 CLINICAL DATA:  Abdominal pain.  History of diabetes. EXAM: CT ABDOMEN AND PELVIS WITH CONTRAST TECHNIQUE: Multidetector CT imaging of the abdomen and pelvis was performed using the standard protocol following bolus administration of intravenous contrast. CONTRAST:  ISOVUE-300 IOPAMIDOL (ISOVUE-300) INJECTION 61% COMPARISON:  07/16/2016 FINDINGS: Lower chest: Atelectasis in the lung bases. Hepatobiliary: Gas containing stones in the gallbladder. No gallbladder wall thickening or infiltration. Mild diffuse fatty infiltration of the liver. No focal liver lesions. No bile duct dilatation. Pancreas: Unremarkable. No pancreatic ductal dilatation or surrounding inflammatory changes. Spleen: Normal in size without focal abnormality. Adrenals/Urinary Tract: No adrenal gland nodules. Kidneys are symmetrical. Renal nephrograms are homogeneous. No hydronephrosis or hydroureter. Bladder is decompressed with a Foley catheter. Stomach/Bowel: Stomach, small bowel, and colon are not abnormally distended. Contrast material flows through to the colon without evidence of bowel obstruction. Diffusely stool-filled colon. No inflammatory changes. Appendix is not identified. Vascular/Lymphatic: Aortic atherosclerosis. No enlarged abdominal or pelvic lymph nodes. Reproductive: Uterus and bilateral adnexa are unremarkable. Other: Infiltration in the subcutaneous fat likely due to edema. Prominent adipose tissues. No free air or free fluid in the abdomen. Musculoskeletal: Degenerative changes in the spine. Wall no destructive bone lesions. IMPRESSION: Cholelithiasis. Foley  catheter in the bladder. No evidence of bowel obstruction or inflammation. Soft tissue edema in the subcutaneous fat. Electronically Signed   By: Burman Nieves M.D.   On: 09/25/2016 06:26   Dg Chest Port 1 View  Result Date: 09/24/2016 CLINICAL DATA:  Severe upper abdominal pain. EXAM: PORTABLE CHEST 1 VIEW COMPARISON:  07/22/2016 FINDINGS: Chronic cardiomegaly and vascular pedicle widening. Mediastinal widening is from hypertrophic fat by recent CT. There is no edema, consolidation, effusion, or pneumothorax. No visible pneumoperitoneum. IMPRESSION: Stable from prior.  No evidence of acute disease. Electronically Signed   By: Marnee Spring M.D.   On: 09/24/2016 16:25    Garth Bigness, MD 09/25/2016, 12:15 PM PGY-1, [facility] Family Medicine FPTS Intern pager: [phone], text pages welcome

## 2016-09-25 NOTE — Consult Note (Addendum)
   [facility] CM Inpatient Consult   09/25/2016  Jenae Woolen 06/13/1955 [id number]   Patient is currently active with [facility] for chronic disease management services as a member in [employer] [facility].   Patient has been engaged by a RN Community Care Coordinator. Our community based plan of care has focused on disease management and community resource support.  Met with the patient at the bedside.  Patient grimacing in pain.  Patient states she just can't get comfortable. Patient is active with [facility] for home health.  She does live alone and her transportation is with ambulance for appointments. She is bedbound and wheelchair bound until someone comes from home health. Visit cut short due to severe pain. Notified nurse, Matthew of pain.   Made Inpatient Case Manager aware that [facility] following and home alone.   Of note, [facility] services does not replace or interfere with any services that are needed or arranged by inpatient case management or social work.  For additional questions or referrals please contact:  Victoria Brewer, RN BSN CCM [facility] Liaison  [phone] business mobile phone Toll free office [phone]   *corrected   

## 2016-09-25 NOTE — Consult Note (Signed)
WOC Nurse wound consult note Reason for Consult: LLE cellulitis Wound type:partial thickness from ruptured blister, full thickness top of great toe Pressure Injury POA: N/A Measurement: posterior calf 3.5cm x 5cm x 0.1cm, 100% pink, moderate clear drainage, (weeping from entire leg), entire lower leg is red with cellulitis. Top of Left great toe: 0.75cm x 1.25cm, 0.1cm, 75% yellow slough, moderate clear drainage, periwound intact. Wound bed:see above Drainage (amount, consistency, odor) see above Periwound: see above Dressing procedure/placement/frequency: I have provided nurses with orders for To LLE Cleanse ruptured blister area (on posterior calf) with NS, pat gently dry, cover with vaseline gauze, ABD, wrap with kerlix, adhere without placing tape on skin. Change BID. To Left great toe, cleanse with NS, pat dry, apply NS moistened 2x2 gauze, cover with dry gauze, adhere with tape. Keep extremity elevated. We will not follow, but will remain available to this patient, to nursing, and the medical and/or surgical teams.  Please re-consult if we need to assist further.    Barnett Hatter, RN-C, WTA-C Wound Treatment Associate

## 2016-09-25 NOTE — Telephone Encounter (Signed)
Attempted x2 to call patient yesterday and got voice mail.  Now in hosp with abd pain.

## 2016-09-25 NOTE — Progress Notes (Signed)
Inpatient Diabetes Program Recommendations  AACE/ADA: New Consensus Statement on Inpatient Glycemic Control (2015)  Target Ranges:  Prepandial:   less than 140 mg/dL      Peak postprandial:   less than 180 mg/dL (1-2 hours)      Critically ill patients:  140 - 180 mg/dL   Lab Results  Component Value Date   GLUCAP 214 (H) 09/25/2016   HGBA1C 6.6 (H) 07/16/2016    Review of Glycemic Control  On Lantus 10 units QHS at home. Post-prandial blood sugars > 180 mg/dL [id number] of 0.4% indicates good glycemic control PTA.  Inpatient Diabetes Program Recommendations:    Add Lantus 5 units QHS (1/2 home dose) Add Novolog 3 units tidwc for meal coverage insulin if pt eats > 50% meal.  Will continue to follow.  Thank you. Ailene Ards, RD, LDN, CDE Inpatient Diabetes Coordinator [phone]

## 2016-09-25 NOTE — Progress Notes (Signed)
Pharmacy Antibiotic Note  Barbara [name] is a 61 y.o. female admitted on 09/24/2016 with sepsis 2/2 LLE cellulitis.  Pharmacy has been consulted for vancomycin dosing. Renal function has returned to normal, normalized CrCl ~69 ml/min.  Plan: Change Vancomycin 1250 mg IV every 12 hours.  Goal trough 15-20 mcg/mL.  Ceftriaxone per MD dosing Monitor renal function, cultures, vanc trough at steady state  Height: 5\' 3"  (160 cm) Weight: (!) 334 lb 8 oz (151.7 kg) IBW/kg (Calculated) : 52.4  Temp (24hrs), Avg:98.7 F (37.1 C), Min:97.9 F (36.6 C), Max:100.4 F (38 C)   Recent Labs Lab 09/24/16 1520 09/24/16 1536 09/24/16 1537 09/25/16 0619  WBC 12.8*  --   --  10.5  CREATININE 1.27* 1.40*  --  0.98  LATICACIDVEN  --   --  1.46  --     Estimated Creatinine Clearance: 88.8 mL/min (by C-G formula based on SCr of 0.98 mg/dL).    Allergies  Allergen Reactions  . Metformin And Related Diarrhea and Nausea And Vomiting    GI intolerance    Antimicrobials this admission: vanc5/22 >> ceftriaxone 5/22 >>  Dose adjustments this admission: n/a  Microbiology results: 5/22BCx: sent 5/22 UCx: sent (chronic foley)  Thank you for allowing pharmacy to be a part of this patient's care.  Loura Back, PharmD, BCPS Clinical Pharmacist Phone for today [phone] Main pharmacy - [phone] 09/25/2016 10:30 AM

## 2016-09-26 ENCOUNTER — Inpatient Hospital Stay (HOSPITAL_COMMUNITY): Payer: PPO

## 2016-09-26 DIAGNOSIS — R109 Unspecified abdominal pain: Secondary | ICD-10-CM

## 2016-09-26 DIAGNOSIS — R1032 Left lower quadrant pain: Secondary | ICD-10-CM

## 2016-09-26 DIAGNOSIS — L0390 Cellulitis, unspecified: Secondary | ICD-10-CM

## 2016-09-26 LAB — BASIC METABOLIC PANEL
ANION GAP: 9 (ref 5–15)
BUN: 37 mg/dL — ABNORMAL HIGH (ref 6–20)
CO2: 27 mmol/L (ref 22–32)
Calcium: 9.2 mg/dL (ref 8.9–10.3)
Chloride: 104 mmol/L (ref 101–111)
Creatinine, Ser: 0.8 mg/dL (ref 0.44–1.00)
GFR calc Af Amer: >60 mL/min (ref >60)
GFR calc non Af Amer: >60 mL/min (ref >60)
GLUCOSE: 162 mg/dL — AB (ref 65–99)
POTASSIUM: 4.1 mmol/L (ref 3.5–5.1)
Sodium: 140 mmol/L (ref 135–145)

## 2016-09-26 LAB — HEMOGLOBIN A1C
HEMOGLOBIN A1C: 7.5 % — AB (ref 4.8–5.6)
MEAN PLASMA GLUCOSE: 169 mg/dL

## 2016-09-26 LAB — GLUCOSE, CAPILLARY
GLUCOSE-CAPILLARY: 259 mg/dL — AB (ref 65–99)
Glucose-Capillary: 161 mg/dL — ABNORMAL HIGH (ref 65–99)
Glucose-Capillary: 208 mg/dL — ABNORMAL HIGH (ref 65–99)
Glucose-Capillary: 207 mg/dL — ABNORMAL HIGH (ref 65–99)

## 2016-09-26 LAB — CBC
HCT: 37 % (ref 36.0–46.0)
HEMOGLOBIN: 11.5 g/dL — AB (ref 12.0–15.0)
MCH: 29.3 pg (ref 26.0–34.0)
MCHC: 31.1 g/dL (ref 30.0–36.0)
MCV: 94.4 fL (ref 78.0–100.0)
Platelets: 196 10*3/uL (ref 150–400)
RBC: 3.92 MIL/uL (ref 3.87–5.11)
RDW: 18.9 % — AB (ref 11.5–15.5)
WBC: 10.3 10*3/uL (ref 4.0–10.5)

## 2016-09-26 MED ORDER — DOXYCYCLINE HYCLATE 100 MG PO TABS
100.0000 mg | ORAL_TABLET | Freq: Two times a day (BID) | ORAL | Status: DC
  Administered 2016-09-26 – 2016-09-27 (×3): 100 mg via ORAL
  Filled 2016-09-26 (×3): qty 1

## 2016-09-26 MED ORDER — ENOXAPARIN SODIUM 80 MG/0.8ML ~~LOC~~ SOLN
75.0000 mg | SUBCUTANEOUS | Status: DC
  Administered 2016-09-26: 22:00:00 75 mg via SUBCUTANEOUS
  Filled 2016-09-26: qty 0.8

## 2016-09-26 MED ORDER — TRAMADOL HCL 50 MG PO TABS
50.0000 mg | ORAL_TABLET | Freq: Once | ORAL | Status: AC
  Administered 2016-09-26: 50 mg via ORAL
  Filled 2016-09-26: qty 1

## 2016-09-26 MED ORDER — MICONAZOLE NITRATE 2 % EX CREA
TOPICAL_CREAM | Freq: Two times a day (BID) | CUTANEOUS | Status: DC
  Administered 2016-09-26 – 2016-09-27 (×2): via TOPICAL
  Filled 2016-09-26: qty 14

## 2016-09-26 MED ORDER — PREDNISONE 50 MG PO TABS
60.0000 mg | ORAL_TABLET | Freq: Every day | ORAL | Status: DC
  Administered 2016-09-27: 60 mg via ORAL
  Filled 2016-09-26: qty 1

## 2016-09-26 MED ORDER — INSULIN GLARGINE 100 UNIT/ML ~~LOC~~ SOLN
5.0000 [IU] | Freq: Every day | SUBCUTANEOUS | Status: DC
  Administered 2016-09-26 – 2016-09-27 (×2): 5 [IU] via SUBCUTANEOUS
  Filled 2016-09-26 (×2): qty 0.05

## 2016-09-26 NOTE — Progress Notes (Signed)
VASCULAR LAB PRELIMINARY  ARTERIAL  ABI completed: Right ABI of 1.09 is suggestive of arterial flow within normal limits at rest. Unable to obtain left ABI due to open wounds and bandages on the calf. Waveforms obtained from the left dorsalis pedis, and posterior tibial arteries appear triphasic. Unable to obtain left TBI due to toe ulcer.   RIGHT    LEFT    PRESSURE WAVEFORM  PRESSURE WAVEFORM  BRACHIAL 155 Triphasic BRACHIAL 148 Triphasic  DP 169 Triphasic DP  Triphasic  PT 157 Triphasic PT  Triphasic    RIGHT LEFT  ABI 1.09      Barbara [name], RVT 09/26/2016, 2:11 PM

## 2016-09-26 NOTE — Progress Notes (Signed)
Family Medicine Teaching Service Daily Progress Note Intern Pager: [phone]  Patient name: Barbara [name] Medical record number: [id number] Date of birth: [birth year] Age: 61 y.o. Gender: female  Primary Care Provider: Moses Manners, MD Consultants: none Code Status: DNR  Pt Overview and Major Events to Date:  5/22 admitted with UTI, ?LLE cellulitis  Assessment and Plan: Barbara Thompsonis a 61 y.o.femalepresenting with abdominal pain, "fogginess".  PMH is significant for multiple sclerosis with spastic paraplegia, chronic prednisone use, indwelling chronic Foley, left lower extremity cellulitis, HTN, T2DM.  Abdominal pain/nausea: Unclear etiology, resolved. Has fever, mild leukocytosis (though on chronic steroids), nausea and ?diarrhea suggestive for gastroenteritis. She also have chronic indwelling Foley and UA concerning for UTI/pyelonephritis. Additional possible etiology includes constipation as pain is over LLQ and patient hesitant to take bowel regimen at home due to immobility. Received vancomycin and ceftriaxone in ED for cellulitis and UTI respectively. CT abd/pelvis with nonobstructing gallstones and diffuse stool.  -Manage cellulitis -narrow antibiotics to doxy  LLE Cellulitis: patient with fever, mild leukocytosis (although on the steroid), LLE skin erythema and lesion. Surprisingly no increased warmth to touch. She also have some right lower extremity skin erythema and swelling. - Follow up cultures   Multiple Sclerosis with spastic paraplegia on chronic steroids:followed by [facility] neurology. Was previously on immune modulator, but was unable to afford this after becoming disabled.  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  - hold home lasix, lisinopril - Continue home aspirin  ? Adrenal insufficiency: History of  this. She is normotensive here. She is on chronic steroid. She has mildly elevated potassium as well -increase prednisone 60mg  QD  T2DM: on Lantus 10 units nightly and Novolog 5 units TID ac at home.   -SSI-resistant  FEN/GI:  -Carb modified  Prophylaxis: Lovenox  Disposition: continued inpatient management of infections  Subjective:  Patient feels well this morning. She had a bowel movement and feels no abdominal pain now.   Objective: Temp:  [97.5 F (36.4 C)-99.5 F (37.5 C)] 98.1 F (36.7 C) (05/24 0506) Pulse Rate:  [81-104] 104 (05/24 0506) Resp:  [18-19] 18 (05/24 0506) BP: (102-104)/(50-58) 102/58 (05/24 0506) SpO2:  [90 %-93 %] 93 % (05/24 0506) Weight:  [335 lb 1.6 oz (152 kg)] 335 lb 1.6 oz (152 kg) (05/23 2218) Physical Exam: General: Obese female sitting in bed in NAD.  Cardiovascular: RRR, no murmur Respiratory: CTAB, distant breath sounds due to body habitus Abdomen: soft, protuberant, +BS, TTP over LLQ. Bruising on upper abdomen consistent with insulin dosing. No inflammation or redness of pannus.  Extremities: minimal sensation over bilateral lower extremities. LLE weeping serous fluid. Erythematous LLE as pictured previously.   Laboratory:  Recent Labs Lab 09/24/16 1520 09/24/16 1536 09/25/16 0619 09/26/16 0501  WBC 12.8*  --  10.5 10.3  HGB 11.6* 11.6* 10.6* 11.5*  HCT 36.2 34.0* 33.8* 37.0  PLT 204  --  186 196    Recent Labs Lab 09/24/16 1520 09/24/16 1536 09/25/16 0619 09/26/16 0501  NA 136 135 138 140  K 5.2* 5.1 4.1 4.1  CL 99* 98* 100* 104  CO2 27  --  27 27  BUN 67* 73* 59* 37*  CREATININE 1.27* 1.40* 0.98 0.80  CALCIUM 9.3  --  8.8* 9.2  PROT 6.5  --  5.7*  --   BILITOT 0.7  --  0.8  --  ALKPHOS 80  --  63  --   ALT 22  --  20  --   AST 31  --  29  --   GLUCOSE 302* 300* 125* 162*   Lipase WNL  Imaging/Diagnostic Tests: Ct Abdomen Pelvis W Contrast  Result Date: 09/25/2016 CLINICAL DATA:  Abdominal pain.  History  of diabetes. EXAM: CT ABDOMEN AND PELVIS WITH CONTRAST TECHNIQUE: Multidetector CT imaging of the abdomen and pelvis was performed using the standard protocol following bolus administration of intravenous contrast. CONTRAST:  ISOVUE-300 IOPAMIDOL (ISOVUE-300) INJECTION 61% COMPARISON:  07/16/2016 FINDINGS: Lower chest: Atelectasis in the lung bases. Hepatobiliary: Gas containing stones in the gallbladder. No gallbladder wall thickening or infiltration. Mild diffuse fatty infiltration of the liver. No focal liver lesions. No bile duct dilatation. Pancreas: Unremarkable. No pancreatic ductal dilatation or surrounding inflammatory changes. Spleen: Normal in size without focal abnormality. Adrenals/Urinary Tract: No adrenal gland nodules. Kidneys are symmetrical. Renal nephrograms are homogeneous. No hydronephrosis or hydroureter. Bladder is decompressed with a Foley catheter. Stomach/Bowel: Stomach, small bowel, and colon are not abnormally distended. Contrast material flows through to the colon without evidence of bowel obstruction. Diffusely stool-filled colon. No inflammatory changes. Appendix is not identified. Vascular/Lymphatic: Aortic atherosclerosis. No enlarged abdominal or pelvic lymph nodes. Reproductive: Uterus and bilateral adnexa are unremarkable. Other: Infiltration in the subcutaneous fat likely due to edema. Prominent adipose tissues. No free air or free fluid in the abdomen. Musculoskeletal: Degenerative changes in the spine. Wall no destructive bone lesions. IMPRESSION: Cholelithiasis. Foley catheter in the bladder. No evidence of bowel obstruction or inflammation. Soft tissue edema in the subcutaneous fat. Electronically Signed   By: Burman Nieves M.D.   On: 09/25/2016 06:26   Dg Chest Port 1 View  Result Date: 09/24/2016 CLINICAL DATA:  Severe upper abdominal pain. EXAM: PORTABLE CHEST 1 VIEW COMPARISON:  07/22/2016 FINDINGS: Chronic cardiomegaly and vascular pedicle widening.  Mediastinal widening is from hypertrophic fat by recent CT. There is no edema, consolidation, effusion, or pneumothorax. No visible pneumoperitoneum. IMPRESSION: Stable from prior.  No evidence of acute disease. Electronically Signed   By: Marnee Spring M.D.   On: 09/24/2016 16:25    Garth Bigness, MD 09/26/2016, 9:50 AM PGY-1, [facility] Family Medicine FPTS Intern pager: [phone], text pages welcome

## 2016-09-26 NOTE — Care Management Note (Signed)
Case Management Note  Patient Details  Name: Barbara [name] MRN: [id number] Date of Birth: 10-07-1955  Subjective/Objective:        CM following for progression and d/c planning.             Action/Plan: Pt active with [facility] for [facility] and HH aide, order entered for services to be resumed. [facility] rep [place] Dec notified to clarify services available to this pt , orders entered.   Expected Discharge Date:                  Expected Discharge Plan:  Home w Home Health Services  In-House Referral:  NA  Discharge planning Services  CM Consult  Post Acute Care Choice:  Home Health Choice offered to:  Patient  DME Arranged:  N/A DME Agency:  NA  HH Arranged:  RN, Nurse's Aide HH Agency:  [facility] (now [facility])  Status of Service:  Completed, signed off  If discussed at Long Length of Stay Meetings, dates discussed:    Additional Comments:  Starlyn Skeans, RN 09/26/2016, 2:35 PM

## 2016-09-26 NOTE — Progress Notes (Signed)
Discussed with Cardiology Fellow, PVC in the bigeminy patten noted on patient's EKG. There were no acute need to treat given patient's other current medical problems, suggested starting patient on a low dose Beta Blocker (metoprolol) once she is more stable with outpatient follow up as needed to evaluate for possible ablation. Appreciate Cardiology input.  Lovena Neighbours, MD Family Medicine, PGY-1

## 2016-09-27 DIAGNOSIS — A419 Sepsis, unspecified organism: Principal | ICD-10-CM

## 2016-09-27 DIAGNOSIS — Z7952 Long term (current) use of systemic steroids: Secondary | ICD-10-CM

## 2016-09-27 DIAGNOSIS — I959 Hypotension, unspecified: Secondary | ICD-10-CM

## 2016-09-27 DIAGNOSIS — R1013 Epigastric pain: Secondary | ICD-10-CM

## 2016-09-27 DIAGNOSIS — I9589 Other hypotension: Secondary | ICD-10-CM

## 2016-09-27 DIAGNOSIS — G35 Multiple sclerosis: Secondary | ICD-10-CM

## 2016-09-27 LAB — GLUCOSE, CAPILLARY
GLUCOSE-CAPILLARY: 137 mg/dL — AB (ref 65–99)
Glucose-Capillary: 241 mg/dL — ABNORMAL HIGH (ref 65–99)

## 2016-09-27 MED ORDER — TRAMADOL HCL 50 MG PO TABS
100.0000 mg | ORAL_TABLET | Freq: Two times a day (BID) | ORAL | Status: DC | PRN
  Administered 2016-09-27: 100 mg via ORAL
  Filled 2016-09-27: qty 2

## 2016-09-27 MED ORDER — MICONAZOLE NITRATE 2 % EX CREA: TOPICAL_CREAM | Freq: Two times a day (BID) | CUTANEOUS | 0 refills | Status: DC

## 2016-09-27 MED ORDER — FUROSEMIDE 40 MG PO TABS
40.0000 mg | ORAL_TABLET | Freq: Every day | ORAL | Status: DC
  Administered 2016-09-27: 40 mg via ORAL
  Filled 2016-09-27: qty 1

## 2016-09-27 MED ORDER — PREDNISONE 20 MG PO TABS: 60.0000 mg | ORAL_TABLET | Freq: Every day | ORAL | 0 refills | Status: DC

## 2016-09-27 MED ORDER — POLYETHYLENE GLYCOL 3350 17 G PO PACK: 17.0000 g | PACK | Freq: Every day | ORAL | 0 refills | Status: DC | PRN

## 2016-09-27 MED ORDER — DOXYCYCLINE HYCLATE 100 MG PO TABS: 100.0000 mg | ORAL_TABLET | Freq: Two times a day (BID) | ORAL | 0 refills | Status: DC

## 2016-09-27 MED ORDER — ENOXAPARIN SODIUM 80 MG/0.8ML ~~LOC~~ SOLN: 0.5000 mg/kg | SUBCUTANEOUS | Status: DC

## 2016-09-27 MED ORDER — GABAPENTIN 600 MG PO TABS: 600.0000 mg | ORAL_TABLET | Freq: Three times a day (TID) | ORAL | 2 refills | Status: DC

## 2016-09-27 MED FILL — predniSONE 20 MG TABS: 20 | 89 days supply | Qty: 180 | Fill #0

## 2016-09-27 MED FILL — POLYETHYLENE GLYCOL 3350 PO: 31 days supply | Qty: 527 | Fill #0

## 2016-09-27 MED FILL — DOXYCYCLINE HYCLATE 100 MG: 100 | 5 days supply | Qty: 10 | Fill #0

## 2016-09-27 NOTE — Progress Notes (Signed)
Family Medicine Teaching Service Daily Progress Note Intern Pager: [phone]  Patient name: Barbara [name] Medical record number: [id number] Date of birth: Sep 22, 1955 Age: 61 y.o. Gender: female  Primary Care Provider: Moses Manners, MD Consultants: none Code Status: DNR  Pt Overview and Major Events to Date:  5/22 admitted with UTI, ?LLE cellulitis  Assessment and Plan: Barbara Thompsonis a 61 y.o.femalepresenting with abdominal pain, "fogginess".  PMH is significant for multiple sclerosis with spastic paraplegia, chronic prednisone use, indwelling chronic Foley, left lower extremity cellulitis, HTN, T2DM.  LLE Cellulitis: patient with fever, mild leukocytosis (although on the steroid), LLE skin erythema and lesion. Surprisingly no increased warmth to touch. She also have some right lower extremity skin erythema and swelling. - Follow up cultures   Multiple Sclerosis with spastic paraplegia on chronic steroids:followed by Barbara [name] neurology. Was previously on immune modulator, but was unable to afford this after becoming disabled.  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  - hold home lasix, lisinopril - Continue home aspirin  ? Adrenal insufficiency: History of this. She is normotensive here. She is on chronic steroid. She has mildly elevated potassium as well -increase prednisone 60mg  QD  T2DM: on Lantus 10 units nightly and Novolog 5 units TID ac at home.   -SSI-resistant  FEN/GI:  -Carb modified  Prophylaxis: Lovenox  Disposition: continued inpatient management of infections  Subjective:  [name] feels well today. She is thrilled to go home. She asks that we increase her tramadol back to her home dose, which is totally reasonable.   Objective: Temp:  [97.7 F (36.5 C)-98.8 F (37.1 C)] 97.7 F (36.5 C)  (05/25 0645) Pulse Rate:  [55-105] 88 (05/25 0645) Resp:  [18] 18 (05/25 0645) BP: (98-124)/(57-77) 124/65 (05/25 0645) SpO2:  [91 %-96 %] 91 % (05/25 0645) Weight:  [304 lb 8 oz (138.1 kg)] 304 lb 8 oz (138.1 kg) (05/24 2014) Physical Exam: General: Obese female sitting in bed in NAD.  Cardiovascular: RRR, no murmur Respiratory: CTAB, distant breath sounds due to body habitus Abdomen: soft, protuberant, +BS, TTP over LLQ. Bruising on upper abdomen consistent with insulin dosing. No inflammation or redness of pannus.  Extremities: minimal sensation over bilateral lower extremities. LLE weeping serous fluid. Erythematous LLE as pictured previously.   Laboratory:  Recent Labs Lab 09/24/16 1520 09/24/16 1536 09/25/16 0619 09/26/16 0501  WBC 12.8*  --  10.5 10.3  HGB 11.6* 11.6* 10.6* 11.5*  HCT 36.2 34.0* 33.8* 37.0  PLT 204  --  186 196    Recent Labs Lab 09/24/16 1520 09/24/16 1536 09/25/16 0619 09/26/16 0501  NA 136 135 138 140  K 5.2* 5.1 4.1 4.1  CL 99* 98* 100* 104  CO2 27  --  27 27  BUN 67* 73* 59* 37*  CREATININE 1.27* 1.40* 0.98 0.80  CALCIUM 9.3  --  8.8* 9.2  PROT 6.5  --  5.7*  --   BILITOT 0.7  --  0.8  --   ALKPHOS 80  --  63  --   ALT 22  --  20  --   AST 31  --  29  --   GLUCOSE 302* 300* 125* 162*   Lipase WNL  Imaging/Diagnostic Tests: Ct Abdomen Pelvis W Contrast  Result Date: 09/25/2016 CLINICAL DATA:  Abdominal pain.  History of diabetes. EXAM: CT ABDOMEN AND PELVIS WITH  CONTRAST TECHNIQUE: Multidetector CT imaging of the abdomen and pelvis was performed using the standard protocol following bolus administration of intravenous contrast. CONTRAST:  ISOVUE-300 IOPAMIDOL (ISOVUE-300) INJECTION 61% COMPARISON:  07/16/2016 FINDINGS: Lower chest: Atelectasis in the lung bases. Hepatobiliary: Gas containing stones in the gallbladder. No gallbladder wall thickening or infiltration. Mild diffuse fatty infiltration of the liver. No focal liver  lesions. No bile duct dilatation. Pancreas: Unremarkable. No pancreatic ductal dilatation or surrounding inflammatory changes. Spleen: Normal in size without focal abnormality. Adrenals/Urinary Tract: No adrenal gland nodules. Kidneys are symmetrical. Renal nephrograms are homogeneous. No hydronephrosis or hydroureter. Bladder is decompressed with a Foley catheter. Stomach/Bowel: Stomach, small bowel, and colon are not abnormally distended. Contrast material flows through to the colon without evidence of bowel obstruction. Diffusely stool-filled colon. No inflammatory changes. Appendix is not identified. Vascular/Lymphatic: Aortic atherosclerosis. No enlarged abdominal or pelvic lymph nodes. Reproductive: Uterus and bilateral adnexa are unremarkable. Other: Infiltration in the subcutaneous fat likely due to edema. Prominent adipose tissues. No free air or free fluid in the abdomen. Musculoskeletal: Degenerative changes in the spine. Wall no destructive bone lesions. IMPRESSION: Cholelithiasis. Foley catheter in the bladder. No evidence of bowel obstruction or inflammation. Soft tissue edema in the subcutaneous fat. Electronically Signed   By: Burman Nieves M.D.   On: 09/25/2016 06:26   Dg Chest Port 1 View  Result Date: 09/24/2016 CLINICAL DATA:  Severe upper abdominal pain. EXAM: PORTABLE CHEST 1 VIEW COMPARISON:  07/22/2016 FINDINGS: Chronic cardiomegaly and vascular pedicle widening. Mediastinal widening is from hypertrophic fat by recent CT. There is no edema, consolidation, effusion, or pneumothorax. No visible pneumoperitoneum. IMPRESSION: Stable from prior.  No evidence of acute disease. Electronically Signed   By: Marnee Spring M.D.   On: 09/24/2016 16:25    Garth Bigness, MD 09/27/2016, 7:32 AM PGY-1, [facility] Family Medicine FPTS Intern pager: [phone], text pages welcome

## 2016-09-27 NOTE — Progress Notes (Signed)
Pt states she has prevalon  boots at home and stated she did not need more ordered.  Leonia Reeves, RN

## 2016-09-27 NOTE — Progress Notes (Signed)
Paged Dr. Lilia Argue re: pt. with increase pain and Ultram and Robaxin not due- orders given.

## 2016-09-27 NOTE — Progress Notes (Signed)
Paged Dr. Nelson Chimes re: pt. having bigeminy PVC's for approx. . And HR 56 BP 112/57; RTC 2037 and orders given.

## 2016-09-27 NOTE — Care Management Note (Signed)
Case Management Note  Patient Details  Name: Barbara [name] MRN: [id number] Date of Birth: 09-May-1955  Subjective/Objective:                    Action/Plan: 09/27/2016 Met with pt this am, [facility] to resume [facility] services and [facility] continues to follow this pt. Per pt she has not further DME needs or HH needs. She will ask her [facility] RN to follow up in the  [facility] re a program she has heard about for pt over 60 with ADL needs.  Pt will transport to home by ambulance.   Expected Discharge Date:    09/27/2016               Expected Discharge Plan:  [place]  In-House Referral:  NA  Discharge planning Services  CM Consult  Post Acute Care Choice:  Home Health Choice offered to:  Patient  DME Arranged:  N/A DME Agency:  NA  HH Arranged:  RN, Nurse's Aide [place] Agency:  [facility] (now [facility])  Status of Service:  Completed, signed off  If discussed at [place] of Stay Meetings, dates discussed:    Additional Comments:  Reika Callanan, Rory Percy, RN Case Manager Signed CASE MANAGEMENT Care Management Note Date of Service: 09/26/2016 2:35 PM      '[]' Hide copied text '[]' Hover for attribution information Case Management Note  Patient Details  Name: Barbara [name] MRN: [id number] Date of Birth: 11-01-1955  Subjective/Objective:        CM following for progression and d/c planning.             Action/Plan: Pt active with [facility] for [facility] and [place] aide, order entered for services to be resumed. [facility] rep Clarise Cruz notified to clarify services available to this pt , orders entered.   Expected Discharge Date:                         Expected Discharge Plan:  [place]  In-House Referral:  NA  Discharge planning Services  CM Consult  Post Acute Care Choice:  Home Health Choice offered to:  Patient  DME Arranged:  N/A DME Agency:  NA  HH Arranged:  RN, Nurse's Aide [place] Agency:  [facility] (now  [facility])  Status of Service:  Completed, signed off  If discussed at [place] of Stay Meetings, dates discussed:    Additional Comments:  Adron Bene, RN 09/26/2016, 2:35 PM                Darren Caldron, Rory Percy, RN 09/27/2016, 1:58 PM

## 2016-09-27 NOTE — Progress Notes (Signed)
[name] to be D/C'd Home per MD order.  Discussed prescriptions and follow up appointments with the patient. Prescriptions given to patient, medication list explained in detail. Pt verbalized understanding.  Allergies as of 09/27/2016      Reactions   Metformin And Related Diarrhea, Nausea And Vomiting   GI intolerance      Medication List    STOP taking these medications   dicyclomine 20 MG tablet Commonly known as:  BENTYL   gabapentin 300 MG capsule Commonly known as:  NEURONTIN Replaced by:  gabapentin 600 MG tablet   lisinopril 5 MG tablet Commonly known as:  PRINIVIL,ZESTRIL     TAKE these medications   acetaminophen 325 MG tablet Commonly known as:  TYLENOL Take 2 tablets (650 mg total) by mouth every 6 (six) hours as needed (mild pain, fever >100.4).   aspirin EC 325 MG tablet Take 325 mg by mouth daily.   calcium carbonate 500 MG chewable tablet Commonly known as:  TUMS - dosed in mg elemental calcium Chew 1 tablet by mouth as needed for indigestion or heartburn.   CO Q 10 PO Take 1 capsule by mouth daily.   colchicine 0.6 MG tablet Take 0.6 mg by mouth daily as needed (gout).   collagenase ointment Commonly known as:  SANTYL Apply topically daily.   diazepam 5 MG tablet Commonly known as:  VALIUM TAKE 1 TABLET BY MOUTH EVERY 6 HOURS AS NEEDED FOR LEG SPASMS   docusate sodium 100 MG capsule Commonly known as:  COLACE Take 1 capsule (100 mg total) by mouth 2 (two) times daily.   doxycycline 100 MG tablet Commonly known as:  VIBRA-TABS Take 1 tablet (100 mg total) by mouth every 12 (twelve) hours. What changed:  when to take this   furosemide 40 MG tablet Commonly known as:  LASIX Take 1 tablet (40 mg total) by mouth daily.   gabapentin 600 MG tablet Commonly known as:  NEURONTIN Take 1 tablet (600 mg total) by mouth 3 (three) times daily. Replaces:  gabapentin 300 MG capsule   glucose blood test strip Test blood sugar three times per  day.   ibuprofen 400 MG tablet Commonly known as:  ADVIL,MOTRIN Take 1 tablet (400 mg total) by mouth every 6 (six) hours as needed for headache or moderate pain.   insulin aspart 100 UNIT/ML FlexPen Commonly known as:  NOVOLOG Inject 5 Units into the skin 3 (three) times daily with meals as needed for high blood sugar (if CBG > 200). What changed:  how much to take  how to take this  when to take this  reasons to take this  additional instructions   Insulin Glargine 100 UNIT/ML Solostar Pen Commonly known as:  LANTUS Inject 10 Units into the skin daily at 10 pm. What changed:  when to take this   Insulin Pen Needle 31G X 8 MM Misc Use to inject insulin 4x/day   Magnesium 250 MG Tabs Take 250 mg by mouth daily.   methocarbamol 750 MG tablet Commonly known as:  ROBAXIN TAKE 2 TABLETS (1,500 MG TOTAL) BY MOUTH 3 (THREE) TIMES DAILY. What changed:  how much to take  when to take this   miconazole 2 % cream Commonly known as:  MICOTIN Apply topically 2 (two) times daily.   MULTIVITAMIN PO Take 1 tablet by mouth daily.   oxybutynin 5 MG 24 hr tablet Commonly known as:  DITROPAN-XL Take 1 tablet (5 mg total) by mouth at bedtime.  polyethylene glycol packet Commonly known as:  MIRALAX / GLYCOLAX Take 17 g by mouth daily as needed. What changed:  when to take this  reasons to take this   predniSONE 20 MG tablet Commonly known as:  DELTASONE Take 3 tablets (60 mg total) by mouth daily with breakfast. 60mg  x 5 more days, 50mg  x 1 week, 40mg  x 1 week, then 30mg  indefinitely Start taking on:  09/28/2016 What changed:  medication strength  how much to take  additional instructions   traMADol 50 MG tablet Commonly known as:  ULTRAM TAKE 1 TABLET BY MOUTH 4 TIMES DAILY   trolamine salicylate 10 % cream Commonly known as:  ASPERCREME Apply 1 application topically as needed for muscle pain.   VITAMIN B-12 PO Take 1 tablet by mouth daily.   Vitamin D3  1000 units Caps Take 4,000 Units by mouth daily.       Vitals:   09/27/16 0645 09/27/16 0912  BP: 124/65 137/80  Pulse: 88 90  Resp: 18 18  Temp: 97.7 F (36.5 C) 97.7 F (36.5 C)    Skin clean, dry and intact without evidence of skin break down, no evidence of skin tears noted. IV catheter discontinued intact. Site without signs and symptoms of complications. Dressing and pressure applied. Pt denies pain at this time. No complaints noted.  An After Visit Summary was printed and given to the patient. Patient escorted via stretcher , and D/C home via [facility].  Nelma Rothman, RN [facility] Phone [phone]

## 2016-09-27 NOTE — Progress Notes (Addendum)
CCMD called to report 21 beats of V-tach that the patient was having.  This RN assessed the pt and the pt was asymptomatic.  Alerted MD Chanetta Marshall    [id number] MD Chanetta Marshall- would monitor pt on tele.   Leonia Reeves, RN

## 2016-09-27 NOTE — Plan of Care (Signed)
Problem: Education: Goal: Knowledge of [facility] Education information/materials will improve Outcome: Progressing POC reviewed with pt.; and consulted MD about pain management.

## 2016-09-27 NOTE — Progress Notes (Signed)
Inpatient Diabetes Program Recommendations  AACE/ADA: New Consensus Statement on Inpatient Glycemic Control (2015)  Target Ranges:  Prepandial:   less than 140 mg/dL      Peak postprandial:   less than 180 mg/dL (1-2 hours)      Critically ill patients:  140 - 180 mg/dL   Lab Results  Component Value Date   GLUCAP 241 (H) 09/27/2016   HGBA1C 7.5 (H) 09/24/2016    Review of Glycemic Control  Post-prandial blood sugars elevated. Needs meal coverage insulin.  Inpatient Diabetes Program Recommendations:    Add Novolog 3 units tidwc for meal coverage insulin.  Will continue to follow.  Thank you. Ailene Ards, RD, LDN, CDE Inpatient Diabetes Coordinator [phone]

## 2016-09-27 NOTE — Care Management Important Message (Signed)
Important Message  Patient Details  Name: Barbara [name] MRN: [id number] Date of Birth: 1955-10-25   Medicare Important Message Given:  Yes    Almin Livingstone 09/27/2016, 1:33 PM

## 2016-09-27 NOTE — Progress Notes (Signed)
FPTS Interim Progress Note:   Called to assess possible bleeding around the foley vs vaginal bleeding by nurse. On my exam, no bleeding noted at all. Suspect bleeding was likely due to irritation of foley catheter. Will have staff apply vaseline.   Loni Muse, MD

## 2016-09-27 NOTE — Progress Notes (Signed)
Alerted MD of pt complaints of burning sensation at foley catheter site. Cleaned and assessed vaginal area. There was some serosanguinous vaginal  Discharge.   After cleaning the pt stated she felt somewhat better.   Leonia Reeves, RN

## 2016-09-27 NOTE — Discharge Summary (Signed)
Family Medicine Teaching [facility] Discharge Summary  Patient name: Barbara [name] Medical record number: [id number] Date of birth: 1956/01/20 Age: 61 y.o. 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 Abdominal pain was achy and diffuse with nausea and no emesis. In the ED, febrile to 100.4, normotensive. CBC with mild leukocytosis at 12.8. CMP with hyperkalemia to 5.2. Serum creatinine 1.27. Glucose 302. Otherwise CMP within normal limits. ABG suggestive for respiratory acidosis. Lactic acid negative. Initial troponin negative. EKG was sinus tachycardia but no ischemic changes. UA suggestive for UTI but obtained from indwelling Foley catheter. CXR without acute cardiopulmonary finding. Patient treated with think and ceftriaxone for possible UTI versus left lower charity cellulitis. On hospital day 2, patient felt significantly improved after having a bowel movement. CT abdomen and pelvis was obtained due to abdominal pain, no acute abnormal findings. Urine culture resulted with multiple species. Blood cultures negative 48 hours and patient was transitioned to doxycycline to cover for possible left largely cellulitis. Notably, blood pressures were low at 90s over 50s, and patient was given increase steroids at 60 mg of prednisone daily. Prevalon boots added to help offload pressure on lower extremities.   Issues for Follow Up:  1. Tapered steroids  (60mg  daily given due to low BPs) 60mg  x 1 week, 50mg  x 1 week, 40mg  x 1 week, then 30mg  indefinitely.  2. Doxycycline x 7 days for possible LLE cellulitis. End date 5/30.  3. Patient reports some burning around foley. Instructed RN to use vaseline around this area. Curious whether premarin could help with this in the long term.  4. Patient with bigeminy and   Significant Procedures: none  Significant Labs and Imaging:   Recent Labs Lab 09/24/16 1520 09/24/16 1536 09/25/16 0619 09/26/16 0501  WBC 12.8*  --  10.5 10.3  HGB 11.6* 11.6* 10.6* 11.5*  HCT 36.2 34.0* 33.8* 37.0  PLT 204  --  186 196    Recent Labs Lab 09/24/16 1520 09/24/16 1536 09/25/16 0619 09/26/16 0501  NA 136 135 138 140  K 5.2* 5.1 4.1 4.1  CL 99* 98* 100* 104  CO2 27  --  27 27  GLUCOSE 302* 300* 125* 162*  BUN 67* 73* 59* 37*  CREATININE 1.27* 1.40* 0.98 0.80  CALCIUM 9.3  --  8.8* 9.2  MG 2.2  --   --   --   ALKPHOS 80  --  63  --   AST 31  --  29  --   ALT 22  --  20  --   ALBUMIN 3.2*  --  2.7*  --     Hemoglobin A1c 7.5 Lipase 31 Urine culture multiple species  Results/Tests Pending at Time of Discharge: final blood cultures  Discharge Medications:  Allergies as of 09/27/2016      Reactions   Metformin And Related Diarrhea, Nausea And Vomiting   GI intolerance      Medication List    STOP taking these medications   dicyclomine 20 MG tablet  Commonly known as:  BENTYL   gabapentin 300 MG capsule Commonly known as:  NEURONTIN Replaced by:  gabapentin 600 MG tablet   lisinopril 5 MG tablet Commonly known as:  PRINIVIL,ZESTRIL     TAKE these medications   acetaminophen 325 MG tablet Commonly known as:  TYLENOL Take 2 tablets (650 mg total) by mouth every 6 (six) hours as needed (mild pain, fever >100.4).   aspirin EC 325 MG tablet Take 325 mg by mouth daily.   calcium carbonate 500 MG chewable tablet Commonly known as:  TUMS - dosed in mg elemental calcium Chew 1 tablet  by mouth as needed for indigestion or heartburn.   CO Q 10 PO Take 1 capsule by mouth daily.   colchicine 0.6 MG tablet Take 0.6 mg by mouth daily as needed (gout).   collagenase ointment Commonly known as:  SANTYL Apply topically daily.   diazepam 5 MG tablet Commonly known as:  VALIUM TAKE 1 TABLET BY MOUTH EVERY 6 HOURS AS NEEDED FOR LEG SPASMS   docusate sodium 100 MG capsule Commonly known as:  COLACE Take 1 capsule (100 mg total) by mouth 2 (two) times daily.   doxycycline 100 MG tablet Commonly known as:  VIBRA-TABS Take 1 tablet (100 mg total) by mouth every 12 (twelve) hours. What changed:  when to take this   furosemide 40 MG tablet Commonly known as:  LASIX Take 1 tablet (40 mg total) by mouth daily.   gabapentin 600 MG tablet Commonly known as:  NEURONTIN Take 1 tablet (600 mg total) by mouth 3 (three) times daily. Replaces:  gabapentin 300 MG capsule   glucose blood test strip Test blood sugar three times per day.   ibuprofen 400 MG tablet Commonly known as:  ADVIL,MOTRIN Take 1 tablet (400 mg total) by mouth every 6 (six) hours as needed for headache or moderate pain.   insulin aspart 100 UNIT/ML FlexPen Commonly known as:  NOVOLOG Inject 5 Units into the skin 3 (three) times daily with meals as needed for high blood sugar (if CBG > 200). What changed:  how much to take  how to take this  when to take this  reasons to take this  additional instructions   Insulin Glargine 100 UNIT/ML Solostar Pen Commonly known as:  LANTUS Inject 10 Units into the skin daily at 10 pm. What changed:  when to take this   Insulin Pen Needle 31G X 8 MM Misc Use to inject insulin 4x/day   Magnesium 250 MG Tabs Take 250 mg by mouth daily.   methocarbamol 750 MG tablet Commonly known as:  ROBAXIN TAKE 2 TABLETS (1,500 MG TOTAL) BY MOUTH 3 (THREE) TIMES DAILY. What changed:  how much to take  when to take this   miconazole 2 % cream Commonly known as:   MICOTIN Apply topically 2 (two) times daily.   MULTIVITAMIN PO Take 1 tablet by mouth daily.   oxybutynin 5 MG 24 hr tablet Commonly known as:  DITROPAN-XL Take 1 tablet (5 mg total) by mouth at bedtime.   polyethylene glycol packet Commonly known as:  MIRALAX / GLYCOLAX Take 17 g by mouth daily as needed. What changed:  when to take this  reasons to take this   predniSONE 20 MG tablet Commonly known as:  DELTASONE Take 3 tablets (60 mg total) by mouth daily with breakfast. 60mg  x 5 more days, 50mg  x 1 week, 40mg  x 1 week, then 30mg  indefinitely Start taking on:  09/28/2016  What changed:  medication strength  how much to take  additional instructions   traMADol 50 MG tablet Commonly known as:  ULTRAM TAKE 1 TABLET BY MOUTH 4 TIMES DAILY   trolamine salicylate 10 % cream Commonly known as:  ASPERCREME Apply 1 application topically as needed for muscle pain.   VITAMIN B-12 PO Take 1 tablet by mouth daily.   Vitamin D3 1000 units Caps Take 4,000 Units by mouth daily.       Discharge Instructions: Please refer to Patient Instructions section of EMR for full details.  Patient was counseled important signs and symptoms that should prompt return to medical care, changes in medications, dietary instructions, activity restrictions, and follow up appointments.   Follow-Up Appointments: Patient's PCP will follow up with her for home visit given need to transport via ambulance.   Garth Bigness, MD 09/27/2016, 2:28 PM PGY-1, [facility]

## 2016-09-28 DIAGNOSIS — E119 Type 2 diabetes mellitus without complications: Secondary | ICD-10-CM | POA: Diagnosis not present

## 2016-09-28 DIAGNOSIS — M4802 Spinal stenosis, cervical region: Secondary | ICD-10-CM | POA: Diagnosis not present

## 2016-09-28 DIAGNOSIS — R531 Weakness: Secondary | ICD-10-CM | POA: Diagnosis not present

## 2016-09-28 DIAGNOSIS — G35 Multiple sclerosis: Secondary | ICD-10-CM | POA: Diagnosis not present

## 2016-09-29 LAB — CULTURE, BLOOD (ROUTINE X 2)
Culture: NO GROWTH
Culture: NO GROWTH
Special Requests: ADEQUATE
Special Requests: ADEQUATE

## 2016-10-01 ENCOUNTER — Encounter: Payer: Self-pay | Admitting: *Deleted

## 2016-10-01 ENCOUNTER — Ambulatory Visit: Payer: Self-pay | Admitting: *Deleted

## 2016-10-01 ENCOUNTER — Other Ambulatory Visit: Payer: Self-pay | Admitting: *Deleted

## 2016-10-01 NOTE — Patient Outreach (Signed)
[facility]) Care Management  10/01/2016  Barbara [name] 1956/04/06 [id number]   Transition of care call  Patient discharged on 5/22 , DX lower leg cellulitis    Spoke with patient reports she is doing okay but feeling tired on today, but better than earlier last week.  Patient reports home health visited her on Sunday. Patient denies any new concerns . Reports her blood sugars are running in the high 90 range, she has not required novolog insulin. Patient able to state how to take tapering dose as prescribed, states she is taking medication as prescribed, request to review full list at home visit.   Spoke with patient regarding previous conversation about [facility]   Patient denies any new concerns at this time.    Plan Originally had scheduled home visit on today patient agreeable to proceed as planned , then she returned call to request to change to another day this week, will plan transition of care home visit on this week.  Placed call to [facility] spoke with Aurther Loft  and placed referral for personal care services including housekeeping.   Egbert Garibaldi, RN, [facility] Care Management [phone]- Mobile [phone]- Toll Free Main Office

## 2016-10-03 ENCOUNTER — Other Ambulatory Visit: Payer: Self-pay | Admitting: *Deleted

## 2016-10-03 DIAGNOSIS — L97222 Non-pressure chronic ulcer of left calf with fat layer exposed: Secondary | ICD-10-CM | POA: Diagnosis not present

## 2016-10-03 DIAGNOSIS — M6281 Muscle weakness (generalized): Secondary | ICD-10-CM | POA: Diagnosis not present

## 2016-10-03 DIAGNOSIS — Z466 Encounter for fitting and adjustment of urinary device: Secondary | ICD-10-CM | POA: Diagnosis not present

## 2016-10-03 DIAGNOSIS — M75101 Unspecified rotator cuff tear or rupture of right shoulder, not specified as traumatic: Secondary | ICD-10-CM | POA: Diagnosis not present

## 2016-10-03 DIAGNOSIS — M48061 Spinal stenosis, lumbar region without neurogenic claudication: Secondary | ICD-10-CM | POA: Diagnosis not present

## 2016-10-03 DIAGNOSIS — I10 Essential (primary) hypertension: Secondary | ICD-10-CM | POA: Diagnosis not present

## 2016-10-03 DIAGNOSIS — L03116 Cellulitis of left lower limb: Secondary | ICD-10-CM | POA: Diagnosis not present

## 2016-10-03 DIAGNOSIS — E119 Type 2 diabetes mellitus without complications: Secondary | ICD-10-CM | POA: Diagnosis not present

## 2016-10-03 DIAGNOSIS — G35 Multiple sclerosis: Secondary | ICD-10-CM | POA: Diagnosis not present

## 2016-10-03 NOTE — Patient Outreach (Signed)
[facility]) Care Management   10/04/2016  Barbara [name] 1955-07-29 [id number]  Barbara [name] is an 61 y.o. female  Subjective:  Patient reports feeling better on today , compared to yesterday.  Patient discussed home health RN has visited on today for wound care dressing change .   Patient discussed she now has hired someone to come to her help a hour a day for 4 days a week, with light housekeeping and she is able to use the hoyer lift to change her position.    Objective: BP 128/68 (BP Location: Right Arm, Patient Position: Sitting, Cuff Size: Large)   Pulse 93   Resp 18   LMP 10/07/2010   SpO2 93%  Review of Systems  Constitutional: Negative.   HENT: Negative.   Eyes: Negative.   Respiratory: Negative.   Cardiovascular: Positive for leg swelling.  Gastrointestinal: Negative.  Negative for abdominal pain, constipation and nausea.  Genitourinary: Negative.  Negative for flank pain and hematuria.       Foley catheter, light amber urine   Musculoskeletal: Positive for back pain.  Skin: Negative.   Neurological: Negative.  Negative for dizziness.  Endo/Heme/Allergies: Negative.   Psychiatric/Behavioral: Negative.     Physical Exam  Constitutional: She is oriented to person, place, and time. She appears well-developed and well-nourished.  Cardiovascular: Normal rate, regular rhythm and normal heart sounds.   Respiratory: Effort normal. She has decreased breath sounds.  GI: Soft. Bowel sounds are normal.  Neurological: She is alert and oriented to person, place, and time.  Unable to move lower extremities   Skin: Skin is warm and dry.     Psychiatric: She has a normal mood and affect. Her behavior is normal. Judgment and thought content normal.    Encounter Medications:   Outpatient Encounter Prescriptions as of 10/03/2016  Medication Sig Note  . aspirin EC 325 MG tablet Take 325 mg by mouth daily.   . calcium carbonate (TUMS - DOSED IN MG  ELEMENTAL CALCIUM) 500 MG chewable tablet Chew 1 tablet by mouth as needed for indigestion or heartburn.   . Cholecalciferol (VITAMIN D3) 1000 units CAPS Take 4,000 Units by mouth daily.   . Coenzyme Q10 (CO Q 10 PO) Take 1 capsule by mouth daily.    . Cyanocobalamin (VITAMIN B-12 PO) Take 1 tablet by mouth daily.   . diazepam (VALIUM) 5 MG tablet TAKE 1 TABLET BY MOUTH EVERY 6 HOURS AS NEEDED FOR LEG SPASMS   . furosemide (LASIX) 40 MG tablet Take 1 tablet (40 mg total) by mouth daily.   Marland Kitchen gabapentin (NEURONTIN) 600 MG tablet Take 1 tablet (600 mg total) by mouth 3 (three) times daily.   Marland Kitchen glucose blood test strip Test blood sugar three times per day.   . ibuprofen (ADVIL,MOTRIN) 400 MG tablet Take 1 tablet (400 mg total) by mouth every 6 (six) hours as needed for headache or moderate pain.   Marland Kitchen insulin aspart (NOVOLOG) 100 UNIT/ML FlexPen Inject 5 Units into the skin 3 (three) times daily with meals as needed for high blood sugar (if CBG > 200). (Patient taking differently: Inject 5 Units into the skin 3 (three) times daily as needed for high blood sugar (if BGL is 200 or greater). ) 09/24/2016: Dosage verified by the patient  . Insulin Glargine (LANTUS) 100 UNIT/ML Solostar Pen Inject 10 Units into the skin daily at 10 pm. (Patient taking differently: Inject 10 Units into the skin at bedtime. ) 09/24/2016: Regimen verified by the  patient  . Insulin Pen Needle 31G X 8 MM MISC Use to inject insulin 4x/day   . Magnesium 250 MG TABS Take 250 mg by mouth daily.   . methocarbamol (ROBAXIN) 750 MG tablet TAKE 2 TABLETS (1,500 MG TOTAL) BY MOUTH 3 (THREE) TIMES DAILY. (Patient taking differently: Take 750 mg by mouth 6 (six) times daily. )   . miconazole (MICOTIN) 2 % cream Apply topically 2 (two) times daily.   . Multiple Vitamins-Minerals (MULTIVITAMIN PO) Take 1 tablet by mouth daily.   Marland Kitchen oxybutynin (DITROPAN-XL) 5 MG 24 hr tablet Take 1 tablet (5 mg total) by mouth at bedtime.   . polyethylene glycol  (MIRALAX / GLYCOLAX) packet Take 17 g by mouth daily as needed.   . predniSONE (DELTASONE) 20 MG tablet Take 3 tablets (60 mg total) by mouth daily with breakfast. 60mg  x 5 more days, 50mg  x 1 week, 40mg  x 1 week, then 30mg  indefinitely   . Probiotic Product (PROBIOTIC COLON SUPPORT) CAPS Take 1 capsule by mouth.   . traMADol (ULTRAM) 50 MG tablet TAKE 1 TABLET BY MOUTH 4 TIMES DAILY   . trolamine salicylate (ASPERCREME) 10 % cream Apply 1 application topically as needed for muscle pain.   Marland Kitchen acetaminophen (TYLENOL) 325 MG tablet Take 2 tablets (650 mg total) by mouth every 6 (six) hours as needed (mild pain, fever >100.4). (Patient not taking: Reported on 09/24/2016)   . colchicine 0.6 MG tablet Take 0.6 mg by mouth daily as needed (gout).  09/24/2016: Taken rarely  . collagenase (SANTYL) ointment Apply topically daily. (Patient not taking: Reported on 07/16/2016)   . docusate sodium (COLACE) 100 MG capsule Take 1 capsule (100 mg total) by mouth 2 (two) times daily. (Patient not taking: Reported on 08/06/2016)   . doxycycline (VIBRA-TABS) 100 MG tablet Take 1 tablet (100 mg total) by mouth every 12 (twelve) hours. (Patient not taking: Reported on 10/03/2016) 10/03/2016: completed   No facility-administered encounter medications on file as of 10/03/2016.   Patient was recently discharged from hospital and all medications have been reviewed.  Functional Status:   In your present state of health, do you have any difficulty performing the following activities: 10/01/2016 09/24/2016  Hearing? N N  Vision? N N  Difficulty concentrating or making decisions? N N  Walking or climbing stairs? Y Y  Dressing or bathing? Y Y  Doing errands, shopping? Malvin Johns  Preparing Food and eating ? Y -  Using the Toilet? Y -  In the past six months, have you accidently leaked urine? Y -  Do you have problems with loss of bowel control? Y -  Managing your Medications? N -  Managing your Finances? N -  Housekeeping or managing  your Housekeeping? Y -  Some recent data might be hidden    Fall/Depression Screening:    Fall Risk  10/03/2016 07/23/2016 05/01/2016  Falls in the past year? Yes Yes Yes  Number falls in past yr: 1 1 1   Injury with Fall? Yes Yes Yes  Risk Factor Category  High Fall Risk High Fall Risk -  Risk for fall due to : Impaired balance/gait;Impaired mobility Impaired balance/gait;Impaired mobility;History of fall(s) Impaired balance/gait;History of fall(s);Impaired mobility  Follow up Falls prevention discussed - Falls prevention discussed   PHQ 2/9 Scores 10/03/2016 08/06/2016 05/14/2016 03/06/2016 02/09/2015 04/20/2014 11/08/2013  PHQ - 2 Score 0 0 0 1 0 0 0    Assessment:   Transition of care home visit  Recent Admission for  Lower leg cellulitis. Continued wound care by home health . She has completed antibiotic   Chronic Condition- MS Patient taking medications as prescribed, voiced understanding of tapering prednisone dose when reviewed. Tolerating being out of bed sitting in recliner most day, with family or home health assisting her with use of hoyer lift.   Chronic Foley No complaints of discomfort, urine, light amber colored  Diabetes Recent Alc 7.5, patient continues to monitor her blood sugar,take insulin as prescribed, denies hypoglycemia symptoms. Reviewed meter last 30 day average 229.   Social  Lives home alone with family support, she still has home health bath aid 2 days a week and hired assistance Will benefit from additional support, awaiting referral from [facility] regarding housekeeper services/bath aide with no cost if approved.   Discussed medical alert system, patient okay with just her cell phone for now , reports she  thinks about future needs she may have  but is managing okay  with current situation at present.   Patient has decided not to schedule post hospital discharge office visit , her plan is to notify RN, PCP if new concerns arise and  communicate with MD and 911 for emergencies.   [facility] CM Care Plan Problem One     Most Recent Value  Care Plan Problem One  Recent hospitalization related to cellulitis LLE  Role Documenting the Problem One  Care Management Coordinator  Care Plan for Problem One  Active  [facility]N Long Term Goal   Patient will not experience a hospitalization in the next 60 days   [facility]N Long Term Goal Start Date  10/01/16  Interventions for Problem One Long Term Goal  transition of care visit completed   [facility] CM Short Term Goal #1   Patient will be able to report symptoms of worsening signs of infection to report to PCP in the next 30 days   [facility] CM Short Term Goal #1 Start Date  10/01/16  Interventions for Short Term Goal #1  Reinforced  importance of notifying RN,MD of signs of infection sooner, elevated temperature, drainage or odor at wound sites, burning at catheter site, odor or change in color of urine   [facility] CM Short Term Goal #2   Patient will be able to report increase knowledge of community resource options in the next 30 days   [facility] CM Short Term Goal #2 Start Date  10/01/16  Interventions for Short Term Goal #2  Reviewed [facility] of housekeeping and bath aide, referral call placed   [facility] CM Short Term Goal #3  Patient will continue with  upper body exercise with bands as previously instructed by therapist  at least 5 days a week in the next 30 days   [facility] CM Short Term Goal #3 Start Date  10/03/16  Interventions for Short Tern Goal #3  Discussed benefits of continued excerise for strength.       Plan:  Will continue weekly outreach for transition of care, next call in a week. Will send PCP this visit note   Egbert Garibaldi, RN, [facility] Care Management [phone]- Mobile [phone]- Toll Free Main Office

## 2016-10-06 DIAGNOSIS — L97222 Non-pressure chronic ulcer of left calf with fat layer exposed: Secondary | ICD-10-CM | POA: Diagnosis not present

## 2016-10-06 DIAGNOSIS — M6281 Muscle weakness (generalized): Secondary | ICD-10-CM | POA: Diagnosis not present

## 2016-10-06 DIAGNOSIS — G35 Multiple sclerosis: Secondary | ICD-10-CM | POA: Diagnosis not present

## 2016-10-06 DIAGNOSIS — M48061 Spinal stenosis, lumbar region without neurogenic claudication: Secondary | ICD-10-CM | POA: Diagnosis not present

## 2016-10-06 DIAGNOSIS — I10 Essential (primary) hypertension: Secondary | ICD-10-CM | POA: Diagnosis not present

## 2016-10-06 DIAGNOSIS — Z466 Encounter for fitting and adjustment of urinary device: Secondary | ICD-10-CM | POA: Diagnosis not present

## 2016-10-06 DIAGNOSIS — L03116 Cellulitis of left lower limb: Secondary | ICD-10-CM | POA: Diagnosis not present

## 2016-10-06 DIAGNOSIS — M75101 Unspecified rotator cuff tear or rupture of right shoulder, not specified as traumatic: Secondary | ICD-10-CM | POA: Diagnosis not present

## 2016-10-06 DIAGNOSIS — E119 Type 2 diabetes mellitus without complications: Secondary | ICD-10-CM | POA: Diagnosis not present

## 2016-10-07 DIAGNOSIS — I10 Essential (primary) hypertension: Secondary | ICD-10-CM | POA: Diagnosis not present

## 2016-10-07 DIAGNOSIS — Z466 Encounter for fitting and adjustment of urinary device: Secondary | ICD-10-CM | POA: Diagnosis not present

## 2016-10-07 DIAGNOSIS — L03116 Cellulitis of left lower limb: Secondary | ICD-10-CM | POA: Diagnosis not present

## 2016-10-07 DIAGNOSIS — G35 Multiple sclerosis: Secondary | ICD-10-CM | POA: Diagnosis not present

## 2016-10-07 DIAGNOSIS — L97222 Non-pressure chronic ulcer of left calf with fat layer exposed: Secondary | ICD-10-CM | POA: Diagnosis not present

## 2016-10-07 DIAGNOSIS — E119 Type 2 diabetes mellitus without complications: Secondary | ICD-10-CM | POA: Diagnosis not present

## 2016-10-07 DIAGNOSIS — M75101 Unspecified rotator cuff tear or rupture of right shoulder, not specified as traumatic: Secondary | ICD-10-CM | POA: Diagnosis not present

## 2016-10-07 DIAGNOSIS — M48061 Spinal stenosis, lumbar region without neurogenic claudication: Secondary | ICD-10-CM | POA: Diagnosis not present

## 2016-10-07 DIAGNOSIS — M6281 Muscle weakness (generalized): Secondary | ICD-10-CM | POA: Diagnosis not present

## 2016-10-08 ENCOUNTER — Other Ambulatory Visit: Payer: Self-pay | Admitting: Family Medicine

## 2016-10-08 MED FILL — FUROSEMIDE 40 MG TABLET: 40 | 90 days supply | Qty: 90 | Fill #0

## 2016-10-08 MED FILL — diazePAM 5 MG TABS: 5 | 22 days supply | Qty: 90 | Fill #2

## 2016-10-10 ENCOUNTER — Other Ambulatory Visit: Payer: Self-pay | Admitting: *Deleted

## 2016-10-10 NOTE — Patient Outreach (Signed)
[facility]) Care Management  10/10/2016  Diondra Haar [birth year] [id number]  Transition of care   Placed call to patient as part of transition of care, no answer able to leave a hipaa compliant message requesting a return call.  Plan Will await return call, if no response will attempt call in the next week.   Egbert Garibaldi, RN, [facility] Care Management [phone]- Mobile [phone]- Toll Free Main Office

## 2016-10-14 ENCOUNTER — Ambulatory Visit: Payer: Self-pay | Admitting: Pharmacist

## 2016-10-15 ENCOUNTER — Telehealth: Payer: Self-pay | Admitting: *Deleted

## 2016-10-15 ENCOUNTER — Other Ambulatory Visit: Payer: Self-pay | Admitting: *Deleted

## 2016-10-15 DIAGNOSIS — Z466 Encounter for fitting and adjustment of urinary device: Secondary | ICD-10-CM | POA: Diagnosis not present

## 2016-10-15 DIAGNOSIS — L97222 Non-pressure chronic ulcer of left calf with fat layer exposed: Secondary | ICD-10-CM | POA: Diagnosis not present

## 2016-10-15 DIAGNOSIS — M48061 Spinal stenosis, lumbar region without neurogenic claudication: Secondary | ICD-10-CM | POA: Diagnosis not present

## 2016-10-15 DIAGNOSIS — L03116 Cellulitis of left lower limb: Secondary | ICD-10-CM | POA: Diagnosis not present

## 2016-10-15 DIAGNOSIS — I10 Essential (primary) hypertension: Secondary | ICD-10-CM | POA: Diagnosis not present

## 2016-10-15 DIAGNOSIS — G35 Multiple sclerosis: Secondary | ICD-10-CM | POA: Diagnosis not present

## 2016-10-15 DIAGNOSIS — M6281 Muscle weakness (generalized): Secondary | ICD-10-CM | POA: Diagnosis not present

## 2016-10-15 DIAGNOSIS — M75101 Unspecified rotator cuff tear or rupture of right shoulder, not specified as traumatic: Secondary | ICD-10-CM | POA: Diagnosis not present

## 2016-10-15 DIAGNOSIS — E119 Type 2 diabetes mellitus without complications: Secondary | ICD-10-CM | POA: Diagnosis not present

## 2016-10-15 NOTE — Telephone Encounter (Signed)
Patient's catheter has been becoming blocked and having more visible sediment in her tubing.  Patient does complain of pain when there is a blockage and with retention.  When [place] with [facility] shows up to remove the catheter, which is becoming more frequent, patient urinates in large amounts.  He is wondering if patient would benefit from long term abx for UTI.  He would like to speak with [name] on treatment options for this patient.  He can be reached at [phone] and it is ok to leave a message if he doesn't answer. Arcadio Cope,CMA

## 2016-10-15 NOTE — Patient Outreach (Signed)
[facility]) Care Management  10/15/2016  Mindee Creppel 04/30/1956 [id number]  Transition of care call  Spoke with patient reports that she is doing fine. She discussed problem she has been having with foley clotting up due to sediment requiring flushing ,prior to normal foley catheter change date. She reports home health RN has contacted PCP office and catheter change today and she feels better.  Patient discussed being up in her mobility wheel chair on yesterday and getting around in kitchen area.Patient has support of neighbors that check on her as assist with feeding her pets. She has personal assist that comes 2 days a week along with her sister that helps her in home and shopping errands.  Patient denies any new concerns at this time. She is still awaiting call from [facility].   Plan Will continue with transition of care program, and weekly outreach , next call in a week   Egbert Garibaldi, RN, [facility] Care Management [phone]- Mobile [phone]- Toll Free Main Office

## 2016-10-16 ENCOUNTER — Other Ambulatory Visit: Payer: Self-pay | Admitting: Pharmacist

## 2016-10-16 MED ORDER — NITROFURANTOIN MACROCRYSTAL 50 MG PO CAPS: 50.0000 mg | ORAL_CAPSULE | Freq: Two times a day (BID) | ORAL | 12 refills | Status: DC

## 2016-10-16 MED FILL — NITROFURANTOIN MCR 50 MG CA: 50 | 30 days supply | Qty: 60 | Fill #0

## 2016-10-16 NOTE — Telephone Encounter (Signed)
Called and discussed.  Having to change catheter every ~48 hours due to sediment causing obstruction.  Best practice would be urology appointment (for possible suprapubic catheter?).  Unfortunately, this is impractical due to cost and difficulty with transport.  Despite a lack of evidence, I will try suppressive macrodantin to alleviate the problem.  I hope it is helpful.

## 2016-10-16 NOTE — Patient Outreach (Signed)
Call to follow up with Ms. Cera about her current out of pocket expense for the year for her patient assistance application. Called and spoke with patient. HIPAA identifiers verified and verbal consent received.  Ms. Ariaz reports that she has not yet reached the $1000 out of pocket expense requirement. Ms. Bonis asks that I call to follow up with her again next month, as she believes that she should be close to this amount at that time.  Will call to follow up again next month.  [name], PharmD, [facility] Clinical Pharmacist [facility] Care Management [phone]

## 2016-10-22 DIAGNOSIS — M6281 Muscle weakness (generalized): Secondary | ICD-10-CM | POA: Diagnosis not present

## 2016-10-22 DIAGNOSIS — G35 Multiple sclerosis: Secondary | ICD-10-CM | POA: Diagnosis not present

## 2016-10-22 DIAGNOSIS — E119 Type 2 diabetes mellitus without complications: Secondary | ICD-10-CM | POA: Diagnosis not present

## 2016-10-22 DIAGNOSIS — I5032 Chronic diastolic (congestive) heart failure: Secondary | ICD-10-CM | POA: Diagnosis not present

## 2016-10-22 DIAGNOSIS — R531 Weakness: Secondary | ICD-10-CM | POA: Diagnosis not present

## 2016-10-22 DIAGNOSIS — M4802 Spinal stenosis, cervical region: Secondary | ICD-10-CM | POA: Diagnosis not present

## 2016-10-22 DIAGNOSIS — L89152 Pressure ulcer of sacral region, stage 2: Secondary | ICD-10-CM | POA: Diagnosis not present

## 2016-10-22 MED FILL — METHOCARBAMOL 750 MG TABLET: 750 | 30 days supply | Qty: 180 | Fill #3

## 2016-10-23 ENCOUNTER — Encounter (HOSPITAL_COMMUNITY): Payer: Self-pay | Admitting: Student

## 2016-10-23 ENCOUNTER — Telehealth: Payer: Self-pay | Admitting: Family Medicine

## 2016-10-23 ENCOUNTER — Emergency Department (HOSPITAL_COMMUNITY)
Admission: EM | Admit: 2016-10-23 | Discharge: 2016-10-23 | Disposition: A | Discharge status: Home / Self Care | Payer: PPO | Source: Home / Self Care | Attending: Emergency Medicine | Admitting: Emergency Medicine

## 2016-10-23 DIAGNOSIS — R030 Elevated blood-pressure reading, without diagnosis of hypertension: Secondary | ICD-10-CM | POA: Diagnosis not present

## 2016-10-23 DIAGNOSIS — Z6841 Body Mass Index (BMI) 40.0 and over, adult: Secondary | ICD-10-CM | POA: Diagnosis not present

## 2016-10-23 DIAGNOSIS — Z79899 Other long term (current) drug therapy: Secondary | ICD-10-CM

## 2016-10-23 DIAGNOSIS — L97222 Non-pressure chronic ulcer of left calf with fat layer exposed: Secondary | ICD-10-CM | POA: Diagnosis not present

## 2016-10-23 DIAGNOSIS — S3720XA Unspecified injury of bladder, initial encounter: Secondary | ICD-10-CM

## 2016-10-23 DIAGNOSIS — L03116 Cellulitis of left lower limb: Secondary | ICD-10-CM | POA: Diagnosis not present

## 2016-10-23 DIAGNOSIS — T83511A Infection and inflammatory reaction due to indwelling urethral catheter, initial encounter: Secondary | ICD-10-CM | POA: Diagnosis not present

## 2016-10-23 DIAGNOSIS — M6281 Muscle weakness (generalized): Secondary | ICD-10-CM | POA: Diagnosis not present

## 2016-10-23 DIAGNOSIS — Y939 Activity, unspecified: Secondary | ICD-10-CM | POA: Insufficient documentation

## 2016-10-23 DIAGNOSIS — Y828 Other medical devices associated with adverse incidents: Secondary | ICD-10-CM

## 2016-10-23 DIAGNOSIS — Z794 Long term (current) use of insulin: Secondary | ICD-10-CM | POA: Insufficient documentation

## 2016-10-23 DIAGNOSIS — A419 Sepsis, unspecified organism: Secondary | ICD-10-CM | POA: Diagnosis not present

## 2016-10-23 DIAGNOSIS — E119 Type 2 diabetes mellitus without complications: Secondary | ICD-10-CM

## 2016-10-23 DIAGNOSIS — Z7982 Long term (current) use of aspirin: Secondary | ICD-10-CM

## 2016-10-23 DIAGNOSIS — T83021A Displacement of indwelling urethral catheter, initial encounter: Secondary | ICD-10-CM | POA: Insufficient documentation

## 2016-10-23 DIAGNOSIS — M75101 Unspecified rotator cuff tear or rupture of right shoulder, not specified as traumatic: Secondary | ICD-10-CM | POA: Diagnosis not present

## 2016-10-23 DIAGNOSIS — Y999 Unspecified external cause status: Secondary | ICD-10-CM

## 2016-10-23 DIAGNOSIS — T839XXA Unspecified complication of genitourinary prosthetic device, implant and graft, initial encounter: Secondary | ICD-10-CM

## 2016-10-23 DIAGNOSIS — R531 Weakness: Secondary | ICD-10-CM | POA: Diagnosis not present

## 2016-10-23 DIAGNOSIS — Z466 Encounter for fitting and adjustment of urinary device: Secondary | ICD-10-CM | POA: Diagnosis not present

## 2016-10-23 DIAGNOSIS — G8220 Paraplegia, unspecified: Secondary | ICD-10-CM | POA: Diagnosis not present

## 2016-10-23 DIAGNOSIS — G35 Multiple sclerosis: Secondary | ICD-10-CM

## 2016-10-23 DIAGNOSIS — Y929 Unspecified place or not applicable: Secondary | ICD-10-CM | POA: Insufficient documentation

## 2016-10-23 DIAGNOSIS — S3730XA Unspecified injury of urethra, initial encounter: Secondary | ICD-10-CM | POA: Diagnosis not present

## 2016-10-23 DIAGNOSIS — E2740 Unspecified adrenocortical insufficiency: Secondary | ICD-10-CM | POA: Diagnosis not present

## 2016-10-23 DIAGNOSIS — M48061 Spinal stenosis, lumbar region without neurogenic claudication: Secondary | ICD-10-CM | POA: Diagnosis not present

## 2016-10-23 DIAGNOSIS — E872 Acidosis: Secondary | ICD-10-CM | POA: Diagnosis not present

## 2016-10-23 DIAGNOSIS — N390 Urinary tract infection, site not specified: Secondary | ICD-10-CM | POA: Diagnosis not present

## 2016-10-23 DIAGNOSIS — Z66 Do not resuscitate: Secondary | ICD-10-CM | POA: Diagnosis not present

## 2016-10-23 DIAGNOSIS — N179 Acute kidney failure, unspecified: Secondary | ICD-10-CM | POA: Diagnosis not present

## 2016-10-23 DIAGNOSIS — S3790XA Unspecified injury of unspecified urinary and pelvic organ, initial encounter: Secondary | ICD-10-CM | POA: Insufficient documentation

## 2016-10-23 DIAGNOSIS — T83098A Other mechanical complication of other indwelling urethral catheter, initial encounter: Secondary | ICD-10-CM | POA: Diagnosis not present

## 2016-10-23 DIAGNOSIS — Z7952 Long term (current) use of systemic steroids: Secondary | ICD-10-CM | POA: Diagnosis not present

## 2016-10-23 DIAGNOSIS — I10 Essential (primary) hypertension: Secondary | ICD-10-CM

## 2016-10-23 DIAGNOSIS — I878 Other specified disorders of veins: Secondary | ICD-10-CM | POA: Diagnosis not present

## 2016-10-23 DIAGNOSIS — N319 Neuromuscular dysfunction of bladder, unspecified: Secondary | ICD-10-CM | POA: Diagnosis not present

## 2016-10-23 DIAGNOSIS — K5900 Constipation, unspecified: Secondary | ICD-10-CM | POA: Diagnosis not present

## 2016-10-23 DIAGNOSIS — E1165 Type 2 diabetes mellitus with hyperglycemia: Secondary | ICD-10-CM | POA: Diagnosis not present

## 2016-10-23 DIAGNOSIS — N939 Abnormal uterine and vaginal bleeding, unspecified: Secondary | ICD-10-CM | POA: Diagnosis not present

## 2016-10-23 LAB — URINALYSIS, ROUTINE W REFLEX MICROSCOPIC: SQUAMOUS EPITHELIAL / LPF: NONE SEEN

## 2016-10-23 LAB — BASIC METABOLIC PANEL
Anion gap: 14 (ref 5–15)
BUN: 29 mg/dL — AB (ref 6–20)
CHLORIDE: 92 mmol/L — AB (ref 101–111)
CO2: 33 mmol/L — AB (ref 22–32)
CREATININE: 0.79 mg/dL (ref 0.44–1.00)
Calcium: 9.5 mg/dL (ref 8.9–10.3)
GFR calc Af Amer: >60 mL/min (ref >60)
GFR calc non Af Amer: >60 mL/min (ref >60)
GLUCOSE: 310 mg/dL — AB (ref 65–99)
POTASSIUM: 4.3 mmol/L (ref 3.5–5.1)
Sodium: 139 mmol/L (ref 135–145)

## 2016-10-23 LAB — CBC
HEMATOCRIT: 43.3 % (ref 36.0–46.0)
HEMOGLOBIN: 13.8 g/dL (ref 12.0–15.0)
MCH: 29.2 pg (ref 26.0–34.0)
MCHC: 31.9 g/dL (ref 30.0–36.0)
MCV: 91.7 fL (ref 78.0–100.0)
Platelets: 220 10*3/uL (ref 150–400)
RBC: 4.72 MIL/uL (ref 3.87–5.11)
RDW: 17.4 % — ABNORMAL HIGH (ref 11.5–15.5)
WBC: 14.2 10*3/uL — ABNORMAL HIGH (ref 4.0–10.5)

## 2016-10-23 MED ORDER — TRAMADOL HCL 50 MG PO TABS
50.0000 mg | ORAL_TABLET | Freq: Once | ORAL | Status: AC
  Administered 2016-10-23: 50 mg via ORAL
  Filled 2016-10-23: qty 1

## 2016-10-23 NOTE — ED Notes (Signed)
Bed: OV70 Expected date: 10/23/16 Expected time: 5:16 PM Means of arrival: Ambulance Comments: 61 yo vag bleeding, foley, unable to pass urine

## 2016-10-23 NOTE — ED Triage Notes (Signed)
Per EMS, pt from home, [facility] nurse reported that pt is having heavy vaginal bleeding and urinary retention today.  Pt has a foley catheter-last time the foley was emptied was last night and it was full.  Pt is A&O x 4.  Pt is incontinent d/t MS.

## 2016-10-23 NOTE — Telephone Encounter (Signed)
Approved the requested order.

## 2016-10-23 NOTE — ED Notes (Signed)
[facility] Called for transport 

## 2016-10-23 NOTE — ED Provider Notes (Signed)
[facility]-EMERGENCY DEPT Provider Note   CSN: [id number] Arrival date & time: 10/23/16  1728     History   Chief Complaint Chief Complaint  Patient presents with  . Vaginal Bleeding  . Urinary Retention    HPI Barbara [name] is a 61 y.o. female presenting with one-day history of urinary retention.   Patient with past medical history of MS in which she cannot feel from the waist down. She is nonambulatory, and has a Foley at baseline. She has had recent issues with her Foley including increased buildup requiring frequent Foley changes. She was put on prophylactic nitrofurantoin 3 days ago, and told that if this does not help the sediment buildup, she will need follow-up with urology.  Patient states she woke up this morning, and noticed that her Foley bag was less full than normal and appeared slightly darker than normal. She emptied her Foley bag, but had minimal return to the day. She was injected to drink more water, but this did not improve her output. Today she felt the increase sensation of needing to urinate, and leaked around her Foley. This leakage was reddish in color. She denies any trauma, talking or other injury to the Foley or urethra. She denies fever, chills, chest pain, shortness of breath, or flank pain.  HPI  Past Medical History:  Diagnosis Date  . Diabetes mellitus without complication (HCC)   . Gout   . Hypertension   . Lower back pain   . Lumbar spondylosis   . MS (multiple sclerosis) (HCC)   . Numbness   . Spinal stenosis   . Weakness     Patient Active Problem List   Diagnosis Date Noted  . Arterial hypotension   . Abdominal pain   . AKI (acute kidney injury) (HCC)   . Leukocytosis   . Cellulitis 09/24/2016  . Shortness of breath   . Hypoxia   . Acute cystitis without hematuria   . Septic shock (HCC)   . Sepsis (HCC) 07/16/2016  . Cellulitis of left lower extremity   . Lower extremity weakness 04/02/2016  . Decubitus ulcer of sacral region   .  Impaired mobility and activities of daily living 03/07/2016  . Palliative care encounter 03/07/2016  . Current chronic use of systemic steroids 03/07/2016  . DM type 2, not at goal [facility]) 03/06/2016  . Cellulitis and abscess of leg 08/28/2015  . MS (multiple sclerosis) (HCC)   . Weakness of both lower extremities   . Absence of bladder continence   . Essential hypertension   . Spastic paraplegia   . Weakness 07/12/2015  . Encounter for chronic pain management 04/21/2014  . Chronic venous insufficiency 11/05/2012  . Spinal stenosis in cervical region 06/03/2011  . Multiple sclerosis (HCC) 12/07/2010  . 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 Spinal stenosis of lumbar region 12/07/2010  . Hypertension 12/07/2010  . Obesity 12/07/2010  . Chronic gout 12/07/2010    Past Surgical History:  Procedure Laterality Date  . CERVICAL DISC SURGERY     Qusetions C5-6 C6-7 and C7-T1    OB History    No data available       Home Medications    Prior to Admission medications   Medication Sig Start Date End Date Taking? Authorizing Provider  acidophilus (RISAQUAD) CAPS capsule Take 1 capsule by mouth every evening.   Yes [provider]  aspirin EC 325 MG tablet Take 325 mg by mouth every evening.    Yes [provider]  Cholecalciferol (VITAMIN D3) 1000 units CAPS Take 4,000 Units by mouth every evening.    Yes [provider]  Coenzyme Q10 (COQ10) 100 MG CAPS Take 100 mg by mouth every evening.   Yes [provider]  colchicine 0.6 MG tablet Take 0.6-1.2 mg by mouth 2 (two) times daily as needed (for gout flares).    Yes [provider]  diazepam (VALIUM) 5 MG tablet TAKE 1 TABLET BY MOUTH EVERY 6 HOURS AS NEEDED FOR LEG SPASMS Patient taking differently: Take 5 mg by mouth every 6 (six) hours as needed for muscle spasms.  05/30/16  Yes Hensel, Santiago Bumpers, MD  furosemide (LASIX) 40 MG tablet TAKE 1 TABLET (40 MG TOTAL) BY MOUTH DAILY. 10/08/16  Yes Hensel,  Santiago Bumpers, MD  gabapentin (NEURONTIN) 600 MG tablet Take 1 tablet (600 mg total) by mouth 3 (three) times daily. 09/27/16  Yes Garth Bigness, MD  ibuprofen (ADVIL,MOTRIN) 400 MG tablet Take 1 tablet (400 mg total) by mouth every 6 (six) hours as needed for headache or moderate pain. 04/05/16  Yes Riccio, Angela C, DO  insulin aspart (NOVOLOG) 100 UNIT/ML FlexPen Inject 5 Units into the skin 3 (three) times daily with meals as needed for high blood sugar (if CBG > 200). Patient taking differently: Inject 5 Units into the skin 3 (three) times daily with meals. Pt also uses as needed for BS greater than 200. 07/22/16  Yes Garth Bigness, MD  Insulin Glargine (LANTUS) 100 UNIT/ML Solostar Pen Inject 10 Units into the skin daily at 10 pm. Patient taking differently: Inject 10 Units into the skin at bedtime.  09/16/16  Yes Hensel, Santiago Bumpers, MD  Magnesium 250 MG TABS Take 250 mg by mouth every evening.    Yes [provider]  methocarbamol (ROBAXIN) 750 MG tablet TAKE 2 TABLETS (1,500 MG TOTAL) BY MOUTH 3 (THREE) TIMES DAILY. 12/14/15  Yes Hensel, Santiago Bumpers, MD  miconazole (MICOTIN) 2 % cream Apply topically 2 (two) times daily. Patient taking differently: Apply 1 application topically 2 (two) times a week. Pt applies to legs on Mondays and Thursdays. 09/27/16  Yes Garth Bigness, MD  Multiple Vitamin (MULTIVITAMIN WITH MINERALS) TABS tablet Take 1 tablet by mouth every evening.   Yes [provider]  nitrofurantoin (MACRODANTIN) 50 MG capsule Take 1 capsule (50 mg total) by mouth 2 (two) times daily. 10/16/16  Yes Hensel, Santiago Bumpers, MD  oxybutynin (DITROPAN-XL) 5 MG 24 hr tablet Take 1 tablet (5 mg total) by mouth at bedtime. 12/05/15  Yes Penumalli, Glenford Bayley, MD  predniSONE (DELTASONE) 20 MG tablet Take 30 mg by mouth daily with breakfast.   Yes [provider]  traMADol (ULTRAM) 50 MG tablet TAKE 1 TABLET BY MOUTH 4 TIMES DAILY 07/25/16  Yes Hensel, Santiago Bumpers, MD    vitamin B-12 (CYANOCOBALAMIN) 1000 MCG tablet Take 1,000 mcg by mouth every evening.   Yes [provider]    Family History Family History  Problem Relation Age of Onset  . Heart disease Mother   . Diabetes Mother   . Cancer Father     Social History Social History  Substance Use Topics  . Smoking status: Never Smoker  . Smokeless tobacco: Never Used  . Alcohol use No     Allergies   Metformin and related   Review of Systems Review of Systems  All other systems reviewed and are negative.    Physical Exam Updated Vital Signs BP 119/71   Pulse 84  Temp 98.2 F (36.8 C) (Oral)   Resp 18   LMP 10/07/2010   SpO2 94%   Physical Exam  Constitutional: She is oriented to person, place, and time. She appears well-developed and well-nourished. No distress.  HENT:  Head: Normocephalic and atraumatic.  Eyes: Conjunctivae and EOM are normal. Pupils are equal, round, and reactive to light.  Neck: Normal range of motion. Neck supple.  Cardiovascular: Normal rate, regular rhythm, normal heart sounds and intact distal pulses.   Pulmonary/Chest: Effort normal and breath sounds normal. No respiratory distress. She has no wheezes.  Abdominal: Soft. Bowel sounds are normal. She exhibits no distension. There is no tenderness.  Genitourinary:  Genitourinary Comments: Initial presentation patient with bleeding and clots, appearing from her urethral canal. A new Foley insertion initially expressed reddish colored urine and as it was advanced, urine was called. As Foley was pulled back and settled place, urine was red again.  Musculoskeletal: Normal range of motion.  Neurological: She is alert and oriented to person, place, and time.  Skin: Skin is warm and dry. She is not diaphoretic.  Psychiatric: She has a normal mood and affect.  Nursing note and vitals reviewed.    ED Treatments / Results  Labs (all labs ordered are listed, but only abnormal results are  displayed) Labs Reviewed  URINALYSIS, ROUTINE W REFLEX MICROSCOPIC - Abnormal; Notable for the following:       Result Value   Color, Urine RED (*)    APPearance TURBID (*)    Glucose, UA   (*)    Value: TEST NOT REPORTED DUE TO COLOR INTERFERENCE OF URINE PIGMENT   Hgb urine dipstick   (*)    Value: TEST NOT REPORTED DUE TO COLOR INTERFERENCE OF URINE PIGMENT   Bilirubin Urine   (*)    Value: TEST NOT REPORTED DUE TO COLOR INTERFERENCE OF URINE PIGMENT   Ketones, ur   (*)    Value: TEST NOT REPORTED DUE TO COLOR INTERFERENCE OF URINE PIGMENT   Protein, ur   (*)    Value: TEST NOT REPORTED DUE TO COLOR INTERFERENCE OF URINE PIGMENT   Nitrite   (*)    Value: TEST NOT REPORTED DUE TO COLOR INTERFERENCE OF URINE PIGMENT   Leukocytes, UA   (*)    Value: TEST NOT REPORTED DUE TO COLOR INTERFERENCE OF URINE PIGMENT   Bacteria, UA FEW (*)    All other components within normal limits  CBC - Abnormal; Notable for the following:    WBC 14.2 (*)    RDW 17.4 (*)    All other components within normal limits  BASIC METABOLIC PANEL - Abnormal; Notable for the following:    Chloride 92 (*)    CO2 33 (*)    Glucose, Bld 310 (*)    BUN 29 (*)    All other components within normal limits  URINE CULTURE    EKG  EKG Interpretation None       Radiology No results found.  Procedures Procedures (including critical care time)  Medications Ordered in ED Medications  traMADol (ULTRAM) tablet 50 mg (50 mg Oral Given 10/23/16 2024)     Initial Impression / Assessment and Plan / ED Course  I have reviewed the triage vital signs and the nursing notes.  Pertinent labs & imaging results that were available during my care of the patient were reviewed by me and considered in my medical decision making (see chart for details).     Patient  with long-term Foley use with recent history of Foley issues including increased sediment causing blockages. When Foley was inserted in the ED, initially  saw red colored urine, then clear urine, and as it settled, red urine. Foley was draining freely. It was clamped, but patient experienced some leakage around the Foley, so it was unclamped. Urine was flowing freely into the foley bag. No evidence that foley created a false lumen. Pt states she no longer fel the uncomfortable sensation that she needed to urinate. Will check basic labs and send urine to culture.  Labs reassuring that there is no systemic infection. Pt's BGL elevated, and pt states her PCP has noted this too. She will f/u with PCP regarding this. UA unable to process due to blood, but few bacteria seen. As pt is on nitrofurantoin and has no flank pain, nausea, or fevers, low suspicion for UTI, kidney stone, or pyelo at this time. Likely urethral damage due to foley.   Pt takes tramadol normally at this time at home, and would like some for her chronic back pain. Will give at home med dosing.  Discussed finding with pt. Urged pt to f/u with urology as soon as possible. Strict return precautions given. Pt states she understands and agrees to plan.    Final Clinical Impressions(s) / ED Diagnoses   Final diagnoses:  Problem with Foley catheter, initial encounter [facility])  Traumatic injury of the bladder and urethra, initial encounter    New Prescriptions New Prescriptions   No medications on file     Alveria Apley, PA-C 10/23/16 2122    Alveria Apley, PA-C 10/23/16 2124    Shaune Pollack, MD 10/24/16 0111

## 2016-10-23 NOTE — Telephone Encounter (Signed)
Barbara [name] would like 5 PRN orders for foley catheter. ep

## 2016-10-23 NOTE — ED Notes (Signed)
Bed: [facility] Expected date:  Expected time:  Means of arrival:  Comments: 

## 2016-10-23 NOTE — Discharge Instructions (Signed)
It is very important that you follow-up with urology as soon as possible. Continue to take the nitrofurantoin. Monitor Foley output over the next several days. Return to the ED if you develop fever, chills, chest pain, shortness of breath, nausea, flank pain, or blood clots passing through the Foley.

## 2016-10-24 ENCOUNTER — Other Ambulatory Visit: Payer: Self-pay | Admitting: *Deleted

## 2016-10-24 DIAGNOSIS — I10 Essential (primary) hypertension: Secondary | ICD-10-CM | POA: Diagnosis not present

## 2016-10-24 DIAGNOSIS — M75101 Unspecified rotator cuff tear or rupture of right shoulder, not specified as traumatic: Secondary | ICD-10-CM | POA: Diagnosis not present

## 2016-10-24 DIAGNOSIS — L97222 Non-pressure chronic ulcer of left calf with fat layer exposed: Secondary | ICD-10-CM | POA: Diagnosis not present

## 2016-10-24 DIAGNOSIS — L03116 Cellulitis of left lower limb: Secondary | ICD-10-CM | POA: Diagnosis not present

## 2016-10-24 DIAGNOSIS — M48061 Spinal stenosis, lumbar region without neurogenic claudication: Secondary | ICD-10-CM | POA: Diagnosis not present

## 2016-10-24 DIAGNOSIS — G35 Multiple sclerosis: Secondary | ICD-10-CM | POA: Diagnosis not present

## 2016-10-24 DIAGNOSIS — M6281 Muscle weakness (generalized): Secondary | ICD-10-CM | POA: Diagnosis not present

## 2016-10-24 DIAGNOSIS — E119 Type 2 diabetes mellitus without complications: Secondary | ICD-10-CM | POA: Diagnosis not present

## 2016-10-24 DIAGNOSIS — Z466 Encounter for fitting and adjustment of urinary device: Secondary | ICD-10-CM | POA: Diagnosis not present

## 2016-10-24 NOTE — Patient Outreach (Signed)
[facility]) Care Management  10/24/2016  Barbara [name] 12-05-1955 [id number]  Transition of care call  Spoke briefly with patient , reports home health bath is present , she requested a return call on next day.  Plan  Will schedule return call to patient on next day.   Egbert Garibaldi, RN, [facility] Care Management [phone] Mobile [phone]- Toll Free Main Office

## 2016-10-25 ENCOUNTER — Other Ambulatory Visit: Payer: Self-pay | Admitting: *Deleted

## 2016-10-25 ENCOUNTER — Inpatient Hospital Stay (HOSPITAL_COMMUNITY)
Admission: EM | Admit: 2016-10-25 | Discharge: 2016-10-29 | DRG: 698 | Disposition: A | Discharge status: Home / Self Care | Payer: PPO | Attending: Family Medicine | Admitting: Family Medicine

## 2016-10-25 ENCOUNTER — Emergency Department (HOSPITAL_COMMUNITY): Payer: PPO

## 2016-10-25 DIAGNOSIS — Z7982 Long term (current) use of aspirin: Secondary | ICD-10-CM | POA: Diagnosis not present

## 2016-10-25 DIAGNOSIS — Z7952 Long term (current) use of systemic steroids: Secondary | ICD-10-CM

## 2016-10-25 DIAGNOSIS — K5900 Constipation, unspecified: Secondary | ICD-10-CM | POA: Diagnosis not present

## 2016-10-25 DIAGNOSIS — I10 Essential (primary) hypertension: Secondary | ICD-10-CM | POA: Diagnosis not present

## 2016-10-25 DIAGNOSIS — N319 Neuromuscular dysfunction of bladder, unspecified: Secondary | ICD-10-CM | POA: Diagnosis not present

## 2016-10-25 DIAGNOSIS — T83511A Infection and inflammatory reaction due to indwelling urethral catheter, initial encounter: Principal | ICD-10-CM | POA: Diagnosis present

## 2016-10-25 DIAGNOSIS — Z794 Long term (current) use of insulin: Secondary | ICD-10-CM

## 2016-10-25 DIAGNOSIS — G35 Multiple sclerosis: Secondary | ICD-10-CM | POA: Diagnosis not present

## 2016-10-25 DIAGNOSIS — Z6841 Body Mass Index (BMI) 40.0 and over, adult: Secondary | ICD-10-CM

## 2016-10-25 DIAGNOSIS — J9600 Acute respiratory failure, unspecified whether with hypoxia or hypercapnia: Secondary | ICD-10-CM

## 2016-10-25 DIAGNOSIS — R0602 Shortness of breath: Secondary | ICD-10-CM | POA: Diagnosis not present

## 2016-10-25 DIAGNOSIS — Y828 Other medical devices associated with adverse incidents: Secondary | ICD-10-CM | POA: Diagnosis present

## 2016-10-25 DIAGNOSIS — A419 Sepsis, unspecified organism: Secondary | ICD-10-CM

## 2016-10-25 DIAGNOSIS — R10819 Abdominal tenderness, unspecified site: Secondary | ICD-10-CM | POA: Diagnosis not present

## 2016-10-25 DIAGNOSIS — R531 Weakness: Secondary | ICD-10-CM

## 2016-10-25 DIAGNOSIS — E872 Acidosis: Secondary | ICD-10-CM | POA: Diagnosis present

## 2016-10-25 DIAGNOSIS — R7309 Other abnormal glucose: Secondary | ICD-10-CM | POA: Diagnosis not present

## 2016-10-25 DIAGNOSIS — N179 Acute kidney failure, unspecified: Secondary | ICD-10-CM | POA: Diagnosis present

## 2016-10-25 DIAGNOSIS — N939 Abnormal uterine and vaginal bleeding, unspecified: Secondary | ICD-10-CM | POA: Diagnosis not present

## 2016-10-25 DIAGNOSIS — G8220 Paraplegia, unspecified: Secondary | ICD-10-CM | POA: Diagnosis not present

## 2016-10-25 DIAGNOSIS — Z79899 Other long term (current) drug therapy: Secondary | ICD-10-CM

## 2016-10-25 DIAGNOSIS — E2740 Unspecified adrenocortical insufficiency: Secondary | ICD-10-CM | POA: Diagnosis not present

## 2016-10-25 DIAGNOSIS — M4802 Spinal stenosis, cervical region: Secondary | ICD-10-CM | POA: Diagnosis not present

## 2016-10-25 DIAGNOSIS — N390 Urinary tract infection, site not specified: Secondary | ICD-10-CM | POA: Diagnosis not present

## 2016-10-25 DIAGNOSIS — E119 Type 2 diabetes mellitus without complications: Secondary | ICD-10-CM | POA: Diagnosis not present

## 2016-10-25 DIAGNOSIS — R1030 Lower abdominal pain, unspecified: Secondary | ICD-10-CM | POA: Diagnosis not present

## 2016-10-25 DIAGNOSIS — I878 Other specified disorders of veins: Secondary | ICD-10-CM | POA: Diagnosis not present

## 2016-10-25 DIAGNOSIS — E1165 Type 2 diabetes mellitus with hyperglycemia: Secondary | ICD-10-CM | POA: Diagnosis present

## 2016-10-25 DIAGNOSIS — Z515 Encounter for palliative care: Secondary | ICD-10-CM | POA: Diagnosis not present

## 2016-10-25 DIAGNOSIS — E669 Obesity, unspecified: Secondary | ICD-10-CM | POA: Diagnosis not present

## 2016-10-25 DIAGNOSIS — Z66 Do not resuscitate: Secondary | ICD-10-CM | POA: Diagnosis not present

## 2016-10-25 DIAGNOSIS — I517 Cardiomegaly: Secondary | ICD-10-CM | POA: Diagnosis not present

## 2016-10-25 DIAGNOSIS — E108 Type 1 diabetes mellitus with unspecified complications: Secondary | ICD-10-CM | POA: Diagnosis not present

## 2016-10-25 LAB — BASIC METABOLIC PANEL
ANION GAP: 12 (ref 5–15)
BUN: 21 mg/dL — ABNORMAL HIGH (ref 6–20)
CHLORIDE: 92 mmol/L — AB (ref 101–111)
CO2: 31 mmol/L (ref 22–32)
Calcium: 8.7 mg/dL — ABNORMAL LOW (ref 8.9–10.3)
Creatinine, Ser: 1.04 mg/dL — ABNORMAL HIGH (ref 0.44–1.00)
GFR calc non Af Amer: 57 mL/min — ABNORMAL LOW (ref >60)
Glucose, Bld: 453 mg/dL — ABNORMAL HIGH (ref 65–99)
Potassium: 4.1 mmol/L (ref 3.5–5.1)
Sodium: 135 mmol/L (ref 135–145)

## 2016-10-25 LAB — CBC WITH DIFFERENTIAL/PLATELET
BASOS PCT: 0 %
Basophils Absolute: 0 10*3/uL (ref 0.0–0.1)
Eosinophils Absolute: 0 10*3/uL (ref 0.0–0.7)
Eosinophils Relative: 0 %
HCT: 36.9 % (ref 36.0–46.0)
HEMOGLOBIN: 11.6 g/dL — AB (ref 12.0–15.0)
LYMPHS ABS: 0.5 10*3/uL — AB (ref 0.7–4.0)
Lymphocytes Relative: 4 %
MCH: 29 pg (ref 26.0–34.0)
MCHC: 31.4 g/dL (ref 30.0–36.0)
MCV: 92.3 fL (ref 78.0–100.0)
MONOS PCT: 3 %
Monocytes Absolute: 0.4 10*3/uL (ref 0.1–1.0)
NEUTROS PCT: 93 %
Neutro Abs: 12.9 10*3/uL — ABNORMAL HIGH (ref 1.7–7.7)
Platelets: 170 10*3/uL (ref 150–400)
RBC: 4 MIL/uL (ref 3.87–5.11)
RDW: 18.1 % — ABNORMAL HIGH (ref 11.5–15.5)
WBC: 13.8 10*3/uL — ABNORMAL HIGH (ref 4.0–10.5)

## 2016-10-25 LAB — URINALYSIS, ROUTINE W REFLEX MICROSCOPIC
Bilirubin Urine: NEGATIVE
Glucose, UA: >=500 mg/dL — AB
Ketones, ur: 20 mg/dL — AB
NITRITE: NEGATIVE
PROTEIN: 100 mg/dL — AB
SPECIFIC GRAVITY, URINE: 1.011 (ref 1.005–1.030)
SQUAMOUS EPITHELIAL / LPF: NONE SEEN
pH: 8 (ref 5.0–8.0)

## 2016-10-25 LAB — CBG MONITORING, ED: GLUCOSE-CAPILLARY: 466 mg/dL — AB (ref 65–99)

## 2016-10-25 LAB — I-STAT CG4 LACTIC ACID, ED: Lactic Acid, Venous: 2.61 mmol/L (ref 0.5–1.9)

## 2016-10-25 LAB — LACTIC ACID, PLASMA: Lactic Acid, Venous: 2.1 mmol/L (ref 0.5–1.9)

## 2016-10-25 LAB — GLUCOSE, CAPILLARY: GLUCOSE-CAPILLARY: 322 mg/dL — AB (ref 65–99)

## 2016-10-25 MED ORDER — GABAPENTIN 600 MG PO TABS
600.0000 mg | ORAL_TABLET | Freq: Three times a day (TID) | ORAL | Status: DC
  Administered 2016-10-25 – 2016-10-29 (×12): 600 mg via ORAL
  Filled 2016-10-25 (×12): qty 1

## 2016-10-25 MED ORDER — DIAZEPAM 5 MG PO TABS
5.0000 mg | ORAL_TABLET | Freq: Four times a day (QID) | ORAL | Status: DC | PRN
  Administered 2016-10-26 – 2016-10-28 (×2): 5 mg via ORAL
  Filled 2016-10-25 (×2): qty 1

## 2016-10-25 MED ORDER — OXYBUTYNIN CHLORIDE ER 5 MG PO TB24
5.0000 mg | ORAL_TABLET | Freq: Every day | ORAL | Status: DC
  Administered 2016-10-25 – 2016-10-28 (×3): 5 mg via ORAL
  Filled 2016-10-25 (×5): qty 1

## 2016-10-25 MED ORDER — PREDNISONE 20 MG PO TABS: 30.0000 mg | ORAL_TABLET | Freq: Every day | ORAL | Status: DC

## 2016-10-25 MED ORDER — INSULIN GLARGINE 100 UNIT/ML ~~LOC~~ SOLN
5.0000 [IU] | Freq: Every day | SUBCUTANEOUS | Status: DC
  Administered 2016-10-25: 5 [IU] via SUBCUTANEOUS
  Filled 2016-10-25: qty 0.05

## 2016-10-25 MED ORDER — IBUPROFEN 400 MG PO TABS
400.0000 mg | ORAL_TABLET | Freq: Four times a day (QID) | ORAL | Status: DC | PRN
  Administered 2016-10-26: 400 mg via ORAL
  Filled 2016-10-25 (×2): qty 1

## 2016-10-25 MED ORDER — PREDNISONE 50 MG PO TABS
60.0000 mg | ORAL_TABLET | Freq: Every day | ORAL | Status: DC
Start: 2016-10-26 — End: 2016-10-29
  Administered 2016-10-26 – 2016-10-29 (×4): 60 mg via ORAL
  Filled 2016-10-25 (×3): qty 1
  Filled 2016-10-25: qty 3

## 2016-10-25 MED ORDER — SODIUM CHLORIDE 0.9% FLUSH
3.0000 mL | Freq: Two times a day (BID) | INTRAVENOUS | Status: DC
  Administered 2016-10-25 – 2016-10-29 (×7): 3 mL via INTRAVENOUS

## 2016-10-25 MED ORDER — RISAQUAD PO CAPS
1.0000 | ORAL_CAPSULE | Freq: Every evening | ORAL | Status: DC
  Administered 2016-10-25 – 2016-10-28 (×4): 1 via ORAL
  Filled 2016-10-25 (×4): qty 1

## 2016-10-25 MED ORDER — DEXTROSE 5 % IV SOLN: 2.0000 g | Freq: Two times a day (BID) | INTRAVENOUS | Status: DC

## 2016-10-25 MED ORDER — TRAMADOL HCL 50 MG PO TABS
50.0000 mg | ORAL_TABLET | Freq: Four times a day (QID) | ORAL | Status: DC | PRN
  Administered 2016-10-25 – 2016-10-29 (×5): 50 mg via ORAL
  Filled 2016-10-25 (×5): qty 1

## 2016-10-25 MED ORDER — DEXTROSE 5 % IV SOLN
2.0000 g | Freq: Two times a day (BID) | INTRAVENOUS | Status: DC
  Administered 2016-10-26 – 2016-10-28 (×5): 2 g via INTRAVENOUS
  Filled 2016-10-25 (×7): qty 2

## 2016-10-25 MED ORDER — ENOXAPARIN SODIUM 40 MG/0.4ML ~~LOC~~ SOLN
40.0000 mg | SUBCUTANEOUS | Status: DC
  Administered 2016-10-25: 40 mg via SUBCUTANEOUS
  Filled 2016-10-25: qty 0.4

## 2016-10-25 MED ORDER — ACETAMINOPHEN 500 MG PO TABS
1000.0000 mg | ORAL_TABLET | Freq: Once | ORAL | Status: AC
  Administered 2016-10-25: 1000 mg via ORAL
  Filled 2016-10-25: qty 2

## 2016-10-25 MED ORDER — VITAMIN B-12 1000 MCG PO TABS
1000.0000 ug | ORAL_TABLET | Freq: Every evening | ORAL | Status: DC
  Administered 2016-10-25 – 2016-10-28 (×4): 1000 ug via ORAL
  Filled 2016-10-25 (×4): qty 1

## 2016-10-25 MED ORDER — ONDANSETRON HCL 4 MG/2ML IJ SOLN: 4.0000 mg | Freq: Four times a day (QID) | INTRAMUSCULAR | Status: DC | PRN

## 2016-10-25 MED ORDER — VANCOMYCIN HCL 10 G IV SOLR
2500.0000 mg | Freq: Once | INTRAVENOUS | Status: AC
  Administered 2016-10-25: 2500 mg via INTRAVENOUS
  Filled 2016-10-25: qty 2500

## 2016-10-25 MED ORDER — VANCOMYCIN HCL IN DEXTROSE 1-5 GM/200ML-% IV SOLN
1000.0000 mg | Freq: Two times a day (BID) | INTRAVENOUS | Status: DC
  Administered 2016-10-26 (×2): 1000 mg via INTRAVENOUS
  Filled 2016-10-25 (×3): qty 200

## 2016-10-25 MED ORDER — COQ10 100 MG PO CAPS: 100.0000 mg | ORAL_CAPSULE | Freq: Every evening | ORAL | Status: DC

## 2016-10-25 MED ORDER — ADULT MULTIVITAMIN W/MINERALS CH
1.0000 | ORAL_TABLET | Freq: Every evening | ORAL | Status: DC
  Administered 2016-10-25 – 2016-10-28 (×4): 1 via ORAL
  Filled 2016-10-25 (×4): qty 1

## 2016-10-25 MED ORDER — VITAMIN D 1000 UNITS PO TABS
4000.0000 [IU] | ORAL_TABLET | Freq: Every evening | ORAL | Status: DC
  Administered 2016-10-26 – 2016-10-28 (×3): 4000 [IU] via ORAL
  Filled 2016-10-25 (×3): qty 4

## 2016-10-25 MED ORDER — MAGNESIUM 200 MG PO TABS
250.0000 mg | ORAL_TABLET | Freq: Every evening | ORAL | Status: DC
  Filled 2016-10-25: qty 2

## 2016-10-25 MED ORDER — ASPIRIN EC 325 MG PO TBEC
325.0000 mg | DELAYED_RELEASE_TABLET | Freq: Every evening | ORAL | Status: DC
  Administered 2016-10-25 – 2016-10-28 (×4): 325 mg via ORAL
  Filled 2016-10-25 (×4): qty 1

## 2016-10-25 MED ORDER — ORAL CARE MOUTH RINSE
15.0000 mL | Freq: Two times a day (BID) | OROMUCOSAL | Status: DC
  Administered 2016-10-25 – 2016-10-29 (×6): 15 mL via OROMUCOSAL

## 2016-10-25 MED ORDER — SODIUM CHLORIDE 0.9 % IV SOLN
INTRAVENOUS | Status: DC
  Administered 2016-10-25: 22:00:00 via INTRAVENOUS

## 2016-10-25 MED ORDER — INSULIN ASPART 100 UNIT/ML ~~LOC~~ SOLN
0.0000 [IU] | Freq: Three times a day (TID) | SUBCUTANEOUS | Status: DC
  Administered 2016-10-26 (×2): 15 [IU] via SUBCUTANEOUS
  Administered 2016-10-27: 2 [IU] via SUBCUTANEOUS
  Administered 2016-10-27: 15 [IU] via SUBCUTANEOUS
  Administered 2016-10-27 – 2016-10-28 (×2): 5 [IU] via SUBCUTANEOUS
  Administered 2016-10-28: 3 [IU] via SUBCUTANEOUS
  Administered 2016-10-28: 8 [IU] via SUBCUTANEOUS
  Administered 2016-10-29: 5 [IU] via SUBCUTANEOUS
  Administered 2016-10-29: 2 [IU] via SUBCUTANEOUS

## 2016-10-25 MED ORDER — SODIUM CHLORIDE 0.9 % IV BOLUS (SEPSIS)
1000.0000 mL | Freq: Once | INTRAVENOUS | Status: AC
  Administered 2016-10-25: 1000 mL via INTRAVENOUS

## 2016-10-25 MED ORDER — METHOCARBAMOL 750 MG PO TABS
1500.0000 mg | ORAL_TABLET | Freq: Three times a day (TID) | ORAL | Status: DC
  Administered 2016-10-25 – 2016-10-29 (×12): 1500 mg via ORAL
  Filled 2016-10-25: qty 2
  Filled 2016-10-25: qty 3
  Filled 2016-10-25 (×5): qty 2
  Filled 2016-10-25 (×2): qty 3
  Filled 2016-10-25 (×3): qty 2

## 2016-10-25 MED ORDER — VANCOMYCIN HCL 10 G IV SOLR
2500.0000 mg | Freq: Two times a day (BID) | INTRAVENOUS | Status: DC
  Filled 2016-10-25 (×2): qty 2500

## 2016-10-25 MED ORDER — ONDANSETRON HCL 4 MG PO TABS: 4.0000 mg | ORAL_TABLET | Freq: Four times a day (QID) | ORAL | Status: DC | PRN

## 2016-10-25 MED ORDER — INSULIN ASPART 100 UNIT/ML ~~LOC~~ SOLN
7.0000 [IU] | Freq: Once | SUBCUTANEOUS | Status: AC
  Administered 2016-10-25: 7 [IU] via SUBCUTANEOUS
  Filled 2016-10-25: qty 1

## 2016-10-25 MED ORDER — INSULIN ASPART 100 UNIT/ML ~~LOC~~ SOLN
5.0000 [IU] | Freq: Three times a day (TID) | SUBCUTANEOUS | Status: DC
  Administered 2016-10-26 – 2016-10-29 (×11): 5 [IU] via SUBCUTANEOUS

## 2016-10-25 MED ORDER — COLCHICINE 0.6 MG PO TABS: 0.6000 mg | ORAL_TABLET | Freq: Two times a day (BID) | ORAL | Status: DC | PRN

## 2016-10-25 MED ORDER — DEXTROSE 5 % IV SOLN
2.0000 g | Freq: Once | INTRAVENOUS | Status: AC
  Administered 2016-10-25: 2 g via INTRAVENOUS
  Filled 2016-10-25: qty 2

## 2016-10-25 NOTE — ED Provider Notes (Signed)
[facility]-EMERGENCY DEPT Provider Note   CSN: [id number] Arrival date & time: 10/25/16  1357     History   Chief Complaint Chief Complaint  Patient presents with  . Urinary Tract Infection  . Hyperglycemia    HPI Barbara [name] is a 61 y.o. female.  HPI   61 yo F with PMHx DM, MS, h/o recurrent UTis with chronic indwelling foley here with fever, AMS. Pt seen 2 days ago for foley bag exchange due to increased urinary sediment. She started macrobid per her Urologist and was d/c'ed as she was o/w afebrile and well. Since then, she has had increasing confusion, weakness, and urinary sediment. She has had poor appetite, no nausea or vomiting. No diarrhea. She began to have increasing BG at home and felt more tired and fatigued, so she presents back to the ED. Denies any pain other than mild suprapubic pain. She does note some blood in her urine as well. No flank pain. No cough or SOB, though she is on O2 at all times at home. She has not been as awake as usual per family.  Past Medical History:  Diagnosis Date  . Diabetes mellitus without complication (HCC)   . Gout   . Hypertension   . Lower back pain   . Lumbar spondylosis   . MS (multiple sclerosis) (HCC)   . Numbness   . Spinal stenosis   . Weakness     Patient Active Problem List   Diagnosis Date Noted  . Arterial hypotension   . Abdominal pain   . AKI (acute kidney injury) (HCC)   . Leukocytosis   . Cellulitis 09/24/2016  . Shortness of breath   . Hypoxia   . Acute cystitis without hematuria   . Septic shock (HCC)   . Sepsis (HCC) 07/16/2016  . Cellulitis of left lower extremity   . Lower extremity weakness 04/02/2016  . Decubitus ulcer of sacral region   . Impaired mobility and activities of daily living 03/07/2016  . Palliative care encounter 03/07/2016  . Current chronic use of systemic steroids 03/07/2016  . DM type 2, not at goal [facility]) 03/06/2016  . Cellulitis and abscess of leg 08/28/2015  . MS (multiple  sclerosis) (HCC)   . Weakness of both lower extremities   . Absence of bladder continence   . Essential hypertension   . Spastic paraplegia   . Weakness 07/12/2015  . Encounter for chronic pain management 04/21/2014  . Chronic venous insufficiency 11/05/2012  . Spinal stenosis in cervical region 06/03/2011  . Multiple sclerosis (HCC) 12/07/2010  . 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 Spinal stenosis of lumbar region 12/07/2010  . Hypertension 12/07/2010  . Obesity 12/07/2010  . Chronic gout 12/07/2010    Past Surgical History:  Procedure Laterality Date  . CERVICAL DISC SURGERY     Qusetions C5-6 C6-7 and C7-T1    OB History    No data available       Home Medications    Prior to Admission medications   Medication Sig Start Date End Date Taking? Authorizing Provider  acidophilus (RISAQUAD) CAPS capsule Take 1 capsule by mouth every evening.   Yes [provider]  aspirin EC 325 MG tablet Take 325 mg by mouth every evening.    Yes [provider]  Cholecalciferol (VITAMIN D3) 1000 units CAPS Take 4,000 Units by mouth every evening.    Yes [provider]  Coenzyme Q10 (COQ10) 100 MG CAPS Take 100 mg by mouth every evening.  Yes [provider]  diazepam (VALIUM) 5 MG tablet TAKE 1 TABLET BY MOUTH EVERY 6 HOURS AS NEEDED FOR LEG SPASMS Patient taking differently: Take 5 mg by mouth every 6 (six) hours as needed for muscle spasms.  05/30/16  Yes Hensel, Santiago Bumpers, MD  furosemide (LASIX) 40 MG tablet TAKE 1 TABLET (40 MG TOTAL) BY MOUTH DAILY. 10/08/16  Yes Hensel, Santiago Bumpers, MD  gabapentin (NEURONTIN) 600 MG tablet Take 1 tablet (600 mg total) by mouth 3 (three) times daily. 09/27/16  Yes Garth Bigness, MD  insulin aspart (NOVOLOG) 100 UNIT/ML FlexPen Inject 5 Units into the skin 3 (three) times daily with meals as needed for high blood sugar (if CBG > 200). Patient taking differently: Inject 5 Units into the skin 3 (three) times daily with  meals. Pt also uses as needed for BS greater than 200. 07/22/16  Yes Garth Bigness, MD  Insulin Glargine (LANTUS) 100 UNIT/ML Solostar Pen Inject 10 Units into the skin daily at 10 pm. Patient taking differently: Inject 10 Units into the skin at bedtime.  09/16/16  Yes Hensel, Santiago Bumpers, MD  Magnesium 250 MG TABS Take 250 mg by mouth every evening.    Yes [provider]  methocarbamol (ROBAXIN) 750 MG tablet TAKE 2 TABLETS (1,500 MG TOTAL) BY MOUTH 3 (THREE) TIMES DAILY. 12/14/15  Yes Hensel, Santiago Bumpers, MD  miconazole (MICOTIN) 2 % cream Apply topically 2 (two) times daily. Patient taking differently: Apply 1 application topically 2 (two) times a week. Pt applies to legs on Mondays and Thursdays. 09/27/16  Yes Garth Bigness, MD  Multiple Vitamin (MULTIVITAMIN WITH MINERALS) TABS tablet Take 1 tablet by mouth every evening.   Yes [provider]  nitrofurantoin (MACRODANTIN) 50 MG capsule Take 1 capsule (50 mg total) by mouth 2 (two) times daily. 10/16/16  Yes Hensel, Santiago Bumpers, MD  oxybutynin (DITROPAN-XL) 5 MG 24 hr tablet Take 1 tablet (5 mg total) by mouth at bedtime. 12/05/15  Yes Penumalli, Glenford Bayley, MD  predniSONE (DELTASONE) 20 MG tablet Take 30 mg by mouth daily with breakfast.   Yes [provider]  traMADol (ULTRAM) 50 MG tablet TAKE 1 TABLET BY MOUTH 4 TIMES DAILY 07/25/16  Yes Hensel, Santiago Bumpers, MD  vitamin B-12 (CYANOCOBALAMIN) 1000 MCG tablet Take 1,000 mcg by mouth every evening.   Yes [provider]  colchicine 0.6 MG tablet Take 0.6-1.2 mg by mouth 2 (two) times daily as needed (for gout flares).     [provider]  ibuprofen (ADVIL,MOTRIN) 400 MG tablet Take 1 tablet (400 mg total) by mouth every 6 (six) hours as needed for headache or moderate pain. 04/05/16   Tillman Sers, DO    Family History Family History  Problem Relation Age of Onset  . Heart disease Mother   . Diabetes Mother   . Cancer Father     Social  History Social History  Substance Use Topics  . Smoking status: Never Smoker  . Smokeless tobacco: Never Used  . Alcohol use No     Allergies   Metformin and related   Review of Systems Review of Systems  Constitutional: Positive for fatigue.  Genitourinary: Positive for dysuria and frequency.  Neurological: Positive for weakness.  Psychiatric/Behavioral: Positive for confusion.  All other systems reviewed and are negative.    Physical Exam Updated Vital Signs BP 102/78 (BP Location: Right Arm)   Pulse (!) 104   Temp (!) 100.4 F (38 C) (Rectal)  Resp 15   LMP 10/07/2010   SpO2 92%   Physical Exam  Constitutional: She is oriented to person, place, and time. She appears well-developed and well-nourished.  Chronically ill appearing, fatigued  HENT:  Head: Normocephalic and atraumatic.  Moderately dry MM  Eyes: Conjunctivae are normal.  Neck: Neck supple.  Cardiovascular: Normal rate, regular rhythm and normal heart sounds.  Exam reveals no friction rub.   No murmur heard. Pulmonary/Chest: Effort normal. No respiratory distress. She has no wheezes. She has rales (Left basilar).  Abdominal: She exhibits no distension. There is tenderness (moderate suprapubic TTP).  Musculoskeletal: She exhibits no edema.  Neurological: She is alert and oriented to person, place, and time. She exhibits normal muscle tone.  Skin: Skin is warm. Capillary refill takes less than 2 seconds. No rash noted.  Psychiatric: She has a normal mood and affect.  Nursing note and vitals reviewed.    ED Treatments / Results  Labs (all labs ordered are listed, but only abnormal results are displayed) Labs Reviewed  BASIC METABOLIC PANEL - Abnormal; Notable for the following:       Result Value   Chloride 92 (*)    Glucose, Bld 453 (*)    BUN 21 (*)    Creatinine, Ser 1.04 (*)    Calcium 8.7 (*)    GFR calc non Af Amer 57 (*)    All other components within normal limits  CBC WITH  DIFFERENTIAL/PLATELET - Abnormal; Notable for the following:    WBC 13.8 (*)    Hemoglobin 11.6 (*)    RDW 18.1 (*)    Neutro Abs 12.9 (*)    Lymphs Abs 0.5 (*)    All other components within normal limits  URINALYSIS, ROUTINE W REFLEX MICROSCOPIC - Abnormal; Notable for the following:    APPearance CLOUDY (*)    Glucose, UA >=500 (*)    Hgb urine dipstick MODERATE (*)    Ketones, ur 20 (*)    Protein, ur 100 (*)    Leukocytes, UA LARGE (*)    Bacteria, UA MANY (*)    All other components within normal limits  CBG MONITORING, ED - Abnormal; Notable for the following:    Glucose-Capillary 466 (*)    All other components within normal limits  I-STAT CG4 LACTIC ACID, ED - Abnormal; Notable for the following:    Lactic Acid, Venous 2.61 (*)    All other components within normal limits  CULTURE, BLOOD (ROUTINE X 2)  CULTURE, BLOOD (ROUTINE X 2)  URINE CULTURE    EKG  EKG Interpretation  Date/Time:  Friday October 25 2016 14:57:09 EDT Ventricular Rate:  101 PR Interval:    QRS Duration: 85 QT Interval:  384 QTC Calculation: 498 R Axis:   84 Text Interpretation:  Sinus tachycardia Borderline right axis deviation Low voltage, precordial leads Since last EKG, bigeminy has improved Confirmed by Shaune Pollack [phone]) on 10/25/2016 3:24:52 PM       Radiology No results found.  Procedures .Critical Care Performed by: Shaune Pollack Authorized by: Shaune Pollack      (including critical care time)  CRITICAL CARE Performed by: Dollene Cleveland   Total critical care time: 35 minutes  Critical care time was exclusive of separately billable procedures and treating other patients.  Critical care was necessary to treat or prevent imminent or life-threatening deterioration.  Critical care was time spent personally by me on the following activities: development of treatment plan with patient and/or surrogate as well  as nursing, discussions with consultants, evaluation of  patient's response to treatment, examination of patient, obtaining history from patient or surrogate, ordering and performing treatments and interventions, ordering and review of laboratory studies, ordering and review of radiographic studies, pulse oximetry and re-evaluation of patient's condition.    Medications Ordered in ED Medications  sodium chloride 0.9 % bolus 1,000 mL (1,000 mLs Intravenous New Bag/Given 10/25/16 1554)  sodium chloride 0.9 % bolus 1,000 mL (1,000 mLs Intravenous New Bag/Given 10/25/16 1554)    And  sodium chloride 0.9 % bolus 1,000 mL (not administered)  ceFEPIme (MAXIPIME) 2 g in dextrose 5 % 50 mL IVPB (not administered)  acetaminophen (TYLENOL) tablet 1,000 mg (1,000 mg Oral Given 10/25/16 1546)  insulin aspart (novoLOG) injection 7 Units (7 Units Subcutaneous Given 10/25/16 1546)     Initial Impression / Assessment and Plan / ED Course  I have reviewed the triage vital signs and the nursing notes.  Pertinent labs & imaging results that were available during my care of the patient were reviewed by me and considered in my medical decision making (see chart for details).     62 yo F with PMHx MS, indwelling foley, morbid obesity here with sepsis, likely 2/2 UTI. On arrival, pt is tachycardic, febrile, mildly hypotensive. Labs show marked leukocytosis, hyperglycemia, and rpe-renal AKI c/w dehydration. Will initiate CODE SEPSIS. Per review of UCx, pt has grown MDR Klebsiella and Proetus in the past - will place on Vanc/Cefepime, continue fluids, and plan for admission.  Additional labs show +UTI, ketonuria c/w dehydration, as well as LA 2.61. Normal CO2, however, so do not suspect DKA. Pt given IVF, insulin, and will admit. She feels improved on exam, BP >110. Given LA<4 with no hypotension, and given morbid obesity with tenuous resp status, will continue cautious fluids and hold on 30 cc/kg as this would be >4L.  Final Clinical Impressions(s) / ED Diagnoses   Final  diagnoses:  Sepsis due to urinary tract infection [facility])    New Prescriptions New Prescriptions   No medications on file     Shaune Pollack, MD 10/25/16 [phone]

## 2016-10-25 NOTE — ED Triage Notes (Addendum)
Pt arrived via [facility] from home c/o tingling in her hands and arms. Pt recently seen at [facility] hospital where her permanent foley bag was changed. EMS CBG 457. Pt has hx of DM2 but typically runs in upper 100's, per pt. Last insulin taken last night; unable to assess her glucose level this morning and has not taken insulin today. Pt is on oxygen at home but was SP02 was in the upper 80's upon EMS arrival on scene, per EMS. SP02 in low 90's upon arrival to ED. Pt is alert and oriented x4.

## 2016-10-25 NOTE — Patient Outreach (Signed)
[facility]) Care Management  10/25/2016  Barbara [name] [birth year] [id number]  Transition of care call  Placed call to patient , no answer unable to leave a message.  Plan Will plan follow up on next business day.   Egbert Garibaldi, RN, [facility] Care Management [phone]- Mobile [phone]- Toll Free Main Office

## 2016-10-25 NOTE — Progress Notes (Signed)
Pharmacy Antibiotic Note Makaelynn Hersman is a 61 y.o. female admitted on 10/25/2016 with fever and AMS. Pt has MS and has a chronic foley and hx of recurrent UTIs. Planning to start broad spectrum antibiotics. WBC 13.8, SCr 1, baseline appears to be 0.7-1, LA 2.6.   Plan: -Cefepime 2 g IV x1 then 2g/12h -Vancomycin 2500 mg IV x1 then 1g/12h -Monitor renal fx, cultures, duration of therapy -Obtain VT at Css    Recent Labs Lab 10/23/16 1857 10/25/16 1448 10/25/16 1620  WBC 14.2* 13.8*  --   CREATININE 0.79 1.04*  --   LATICACIDVEN  --   --  2.61*    CrCl cannot be calculated (Unknown ideal weight.).    Allergies  Allergen Reactions  . Metformin And Related Diarrhea and Nausea And Vomiting    Antimicrobials this admission: 6/22 cefepime > 6/22 vancomycin >  Dose adjustments this admission: N/A  Microbiology results: 6/22 blood cx: 6/22 urine cx:   Baldemar Friday 10/25/2016 6:01 PM

## 2016-10-25 NOTE — H&P (Signed)
Family Medicine Teaching [facility] Admission History and Physical Service Pager: [phone]  Patient name: Barbara [name] Medical record number: [id number] Date of birth: 07/15/1955 Age: 61 y.o. Gender: female  Primary Care Provider: Moses Manners, MD Consultants: None Code Status: DNR (confirmed on admission)  Chief Complaint: chills, clogged indwelling Foley  Assessment and Plan: [name] is a 61 y.o. female presenting with chills and clogged indwelling Foley. PMH is significant for multiple sclerosis with spastic paraplegia, chronic prednisone use, indwelling chronic Foley, left lower extremity cellulitis, HTN, T2DM.  Sepsis: Likely 2/2 UTI. HR 104, temp 100.4, and BP 97/62 on admission. O2 sat 80% on RA. Also with leukocytosis to 13.8, hyperglycemia with CBG 453, and likely pre-renal AKI (Cr 1.04, baseline 0.8-0.9). Code Sepsis initiated in ED. Initial LA 2.61 prior to IVF. UA showed many bacteria, moderate hemoglobin, and large leukocytes, however patient does have chronic indwelling Foley catheter. UA also with ketonuria consistent with suspected dehydration.  - Admit to stepdown, attending Dr. Lum Babe - Telemetry - Continue vanc/cefepime (patient has grown MDR Klebsiella and Proteus in the past) - Continuous pulse ox - Blood and urine cultures pending - Trend lactic acid - AM CBC, BMP - Continue IVF at 75 cc/hr, but will monitor fluid status closely given current LE edema - Holding home Macrobid for UTI prophylaxis - I/Os to measure urinary output  AKI: Cr 1.04 on admission, up from baseline of 0.8-0.9. UA consistent with dehydration, so likely prerenal and 2/2 sepsis.  - AM BMP - Continue IVF  Multiple Sclerosis with spastic paraplegia on chronic steroids:Followed by [facility] neurology. Was previously on immune modulator, but was unable to afford this after becoming disabled. Currently taking 30mg  prednisone at home. - Continue home Valium, gabapentin,  Robaxin, Tramadol - Continue home oxybutynin for neurogenic bladder - Increase prednisone to 60mg  qd for stress dosing  HTN: Currently hypotensive. On Lasix at home.  - Hold home Lasix given hypotension and AKI  Hx of adrenal insufficiency: Hypotensive here though likely due to sepsis rather than adrenal insufficiency. On prednisone 30mg  qd at home.  - Increase prednisone to 60mg  qd for stress-dosing  T2DM: On Lantus 10 units qhs and Novolog 5 units TID AC at home. Last A1C 7.5 in 05/18. CBG elevated to 453 on admission.  - Decrease Lantus to 5U qhs during admission - mSSI - CBG qACHS  FEN/GI: carb-modified heart healthy diet Prophylaxis: Lovenox  Disposition: Admit to stepdown  History of Present Illness:  Barbara [name] is a 61 y.o. female presenting with chills and clogged indwelling Foley catheter.   Patient initially presented to [facility] ED two days ago when she noticed her chronic indwelling Foley catheter was clogged. It was replaced in [facility] ED and patient was sent home. Yesterday, her [facility] nurse was unable to flush the catheter reportedly due to "blood clots," so he replaced it again. Last night patient experienced chills and muscle spasms all night, and this morning noted that newly replaced catheter still was not draining properly, so presented to ED.  In ED, patient is reporting generalized abdominal pain but no other symptoms. Her family member at bedside says patient has not been mentating normally today, and patient agrees. Patient says she feels as if she is thinking slowly, and family member thinks she is speaking more slowly than usual. Denies nausea, vomiting. Denies fevers last night or otherwise. Endorses hematuria (has seen clots in Foley). Denies constipation, diarrhea. Patient was started on daily Macrobid five days ago for UTI prophylaxis  by her PCP [name] and has been taking this as prescribed. Denies any respiratory difficulty. Was previously on home O2 but was told  at last admission one month ago that she no longer required it. Endorses current generalized HA but denies vision changes.   In ED, patient meeting sepsis criteria. She was started on vanc/cefepime given history of UTIs growing MDR Klebsiella and Proteus, and bolused. Patient reporting feeling much improved after IVF administration, and vitals also improved. FMTS was consulted for admission and further sepsis management.   Review Of Systems: Per HPI with the following additions:   Review of Systems  Constitutional: Positive for chills. Negative for fever.  Eyes: Negative for blurred vision and double vision.  Respiratory: Negative for shortness of breath and wheezing.   Cardiovascular: Negative for chest pain.  Gastrointestinal: Positive for abdominal pain. Negative for constipation, diarrhea, nausea and vomiting.  Genitourinary: Positive for hematuria.  Musculoskeletal: Positive for back pain.       Leg pain  Neurological: Positive for sensory change, weakness and headaches.    Patient Active Problem List   Diagnosis Date Noted  . Arterial hypotension   . Abdominal pain   . AKI (acute kidney injury) (HCC)   . Leukocytosis   . Cellulitis 09/24/2016  . Shortness of breath   . Hypoxia   . Acute cystitis without hematuria   . Septic shock (HCC)   . Sepsis (HCC) 07/16/2016  . Cellulitis of left lower extremity   . Lower extremity weakness 04/02/2016  . Decubitus ulcer of sacral region   . Impaired mobility and activities of daily living 03/07/2016  . Palliative care encounter 03/07/2016  . Current chronic use of systemic steroids 03/07/2016  . DM type 2, not at goal [facility]) 03/06/2016  . Cellulitis and abscess of leg 08/28/2015  . MS (multiple sclerosis) (HCC)   . Weakness of both lower extremities   . Absence of bladder continence   . Essential hypertension   . Spastic paraplegia   . Weakness 07/12/2015  . Encounter for chronic pain management 04/21/2014  . Chronic venous  insufficiency 11/05/2012  . Spinal stenosis in cervical region 06/03/2011  . Multiple sclerosis (HCC) 12/07/2010  . 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 Spinal stenosis of lumbar region 12/07/2010  . Hypertension 12/07/2010  . Obesity 12/07/2010  . Chronic gout 12/07/2010    Past Medical History: Past Medical History:  Diagnosis Date  . Diabetes mellitus without complication (HCC)   . Gout   . Hypertension   . Lower back pain   . Lumbar spondylosis   . MS (multiple sclerosis) (HCC)   . Numbness   . Spinal stenosis   . Weakness     Past Surgical History: Past Surgical History:  Procedure Laterality Date  . CERVICAL DISC SURGERY     Qusetions C5-6 C6-7 and C7-T1    Social History: Social History  Substance Use Topics  . Smoking status: Never Smoker  . Smokeless tobacco: Never Used  . Alcohol use No   Please also refer to relevant sections of EMR.  Family History: Family History  Problem Relation Age of Onset  . Heart disease Mother   . Diabetes Mother   . Cancer Father     Allergies and Medications: Allergies  Allergen Reactions  . Metformin And Related Diarrhea and Nausea And Vomiting   No current facility-administered medications on file prior to encounter.    Current Outpatient Prescriptions on File Prior to Encounter  Medication Sig Dispense  Refill  . acidophilus (RISAQUAD) CAPS capsule Take 1 capsule by mouth every evening.    Marland Kitchen aspirin EC 325 MG tablet Take 325 mg by mouth every evening.     . Cholecalciferol (VITAMIN D3) 1000 units CAPS Take 4,000 Units by mouth every evening.     . Coenzyme Q10 (COQ10) 100 MG CAPS Take 100 mg by mouth every evening.    . diazepam (VALIUM) 5 MG tablet TAKE 1 TABLET BY MOUTH EVERY 6 HOURS AS NEEDED FOR LEG SPASMS (Patient taking differently: Take 5 mg by mouth every 6 (six) hours as needed for muscle spasms. ) 90 tablet 5  . furosemide (LASIX) 40 MG tablet TAKE 1 TABLET (40 MG TOTAL) BY MOUTH DAILY. 90 tablet 3  .  gabapentin (NEURONTIN) 600 MG tablet Take 1 tablet (600 mg total) by mouth 3 (three) times daily. 180 tablet 2  . insulin aspart (NOVOLOG) 100 UNIT/ML FlexPen Inject 5 Units into the skin 3 (three) times daily with meals as needed for high blood sugar (if CBG > 200). (Patient taking differently: Inject 5 Units into the skin 3 (three) times daily with meals. Pt also uses as needed for BS greater than 200.) 15 mL 11  . Insulin Glargine (LANTUS) 100 UNIT/ML Solostar Pen Inject 10 Units into the skin daily at 10 pm. (Patient taking differently: Inject 10 Units into the skin at bedtime. ) 15 mL 11  . Magnesium 250 MG TABS Take 250 mg by mouth every evening.     . methocarbamol (ROBAXIN) 750 MG tablet TAKE 2 TABLETS (1,500 MG TOTAL) BY MOUTH 3 (THREE) TIMES DAILY. 540 tablet 3  . miconazole (MICOTIN) 2 % cream Apply topically 2 (two) times daily. (Patient taking differently: Apply 1 application topically 2 (two) times a week. Pt applies to legs on Mondays and Thursdays.) 28.35 g 0  . Multiple Vitamin (MULTIVITAMIN WITH MINERALS) TABS tablet Take 1 tablet by mouth every evening.    . nitrofurantoin (MACRODANTIN) 50 MG capsule Take 1 capsule (50 mg total) by mouth 2 (two) times daily. 60 capsule 12  . oxybutynin (DITROPAN-XL) 5 MG 24 hr tablet Take 1 tablet (5 mg total) by mouth at bedtime. 90 tablet 4  . predniSONE (DELTASONE) 20 MG tablet Take 30 mg by mouth daily with breakfast.    . traMADol (ULTRAM) 50 MG tablet TAKE 1 TABLET BY MOUTH 4 TIMES DAILY 360 tablet 1  . vitamin B-12 (CYANOCOBALAMIN) 1000 MCG tablet Take 1,000 mcg by mouth every evening.    . colchicine 0.6 MG tablet Take 0.6-1.2 mg by mouth 2 (two) times daily as needed (for gout flares).     Marland Kitchen ibuprofen (ADVIL,MOTRIN) 400 MG tablet Take 1 tablet (400 mg total) by mouth every 6 (six) hours as needed for headache or moderate pain. 30 tablet 0    Objective: BP 108/69   Pulse 76   Temp (!) 100.4 F (38 C) (Rectal)   Resp 12   Wt 300 lb  (136.1 kg)   LMP 10/07/2010   SpO2 96%   BMI 53.14 kg/m  Exam: General: sitting up in bed, in NAD Eyes: PERRLA, EOMI, no scleral icterus, no discharge ENTM: MMM, no oropharyngeal erythema or exudates Neck: supple Cardiovascular: RRR, no murmurs appreciated Respiratory: CTAB, no wheezes, normal WOB on [place] Gastrointestinal: obese, mild TTP diffusely, soft, non-distended, +BS MSK: 5/5 strength upper extremities bilaterally, paraplegia of lower extremities; 2_ pitting edema lower extremities bilaterally Derm: warm and dry; chronic wounds on LE bandaged with  gauze, dressing clean and dry Neuro: A&Ox4 though does speak slowly, CN II-XII grossly intact, no focal deficits Psych: appropriate mood and affect  Labs and Imaging: CBC BMET   Recent Labs Lab 10/25/16 1448  WBC 13.8*  HGB 11.6*  HCT 36.9  PLT 170    Recent Labs Lab 10/25/16 1448  NA 135  K 4.1  CL 92*  CO2 31  BUN 21*  CREATININE 1.04*  GLUCOSE 453*  CALCIUM 8.7*     No results found.   Marquette Saa, MD 10/25/2016, 7:49 PM PGY-2, [place] [facility] FPTS Intern pager: [phone], text pages welcome

## 2016-10-25 NOTE — ED Notes (Signed)
Lactic acid result given to Dr. Erma Heritage

## 2016-10-25 NOTE — Progress Notes (Signed)
eLink Physician-Brief Progress Note Patient Name: Barbara [name] DOB: [birth year] MRN: [id number]   Date of Service  10/25/2016  HPI/Events of Note  Patient admitted by family medicine with sepsis per admission H&P. Broad-spectrum antibiotics ordered. Lactic acid elevated mildly.   eICU Interventions  Repeat lactic acid ordered as per protocol      Intervention Category Major Interventions: Sepsis - evaluation and management  Lawanda Cousins 10/25/2016, 8:25 PM

## 2016-10-26 LAB — BASIC METABOLIC PANEL
ANION GAP: 10 (ref 5–15)
BUN: 22 mg/dL — ABNORMAL HIGH (ref 6–20)
CALCIUM: 8.2 mg/dL — AB (ref 8.9–10.3)
CO2: 32 mmol/L (ref 22–32)
Chloride: 95 mmol/L — ABNORMAL LOW (ref 101–111)
Creatinine, Ser: 0.92 mg/dL (ref 0.44–1.00)
Glucose, Bld: 277 mg/dL — ABNORMAL HIGH (ref 65–99)
POTASSIUM: 3.9 mmol/L (ref 3.5–5.1)
SODIUM: 137 mmol/L (ref 135–145)

## 2016-10-26 LAB — GLUCOSE, CAPILLARY
GLUCOSE-CAPILLARY: 220 mg/dL — AB (ref 65–99)
GLUCOSE-CAPILLARY: 374 mg/dL — AB (ref 65–99)
GLUCOSE-CAPILLARY: 361 mg/dL — AB (ref 65–99)
Glucose-Capillary: 253 mg/dL — ABNORMAL HIGH (ref 65–99)

## 2016-10-26 LAB — CBC
HEMATOCRIT: 36 % (ref 36.0–46.0)
HEMOGLOBIN: 11 g/dL — AB (ref 12.0–15.0)
MCH: 28.4 pg (ref 26.0–34.0)
MCHC: 30.6 g/dL (ref 30.0–36.0)
MCV: 93 fL (ref 78.0–100.0)
Platelets: 171 10*3/uL (ref 150–400)
RBC: 3.87 MIL/uL (ref 3.87–5.11)
RDW: 18.6 % — ABNORMAL HIGH (ref 11.5–15.5)
WBC: 10.6 10*3/uL — ABNORMAL HIGH (ref 4.0–10.5)

## 2016-10-26 LAB — LACTIC ACID, PLASMA
Lactic Acid, Venous: 2 mmol/L (ref 0.5–1.9)
Lactic Acid, Venous: 1.3 mmol/L (ref 0.5–1.9)

## 2016-10-26 LAB — MRSA PCR SCREENING: MRSA BY PCR: NEGATIVE

## 2016-10-26 LAB — URINE CULTURE: Culture: >=100000 — AB

## 2016-10-26 MED ORDER — FUROSEMIDE 20 MG PO TABS
20.0000 mg | ORAL_TABLET | Freq: Every day | ORAL | Status: DC
  Administered 2016-10-26 – 2016-10-27 (×2): 20 mg via ORAL
  Filled 2016-10-26 (×2): qty 1

## 2016-10-26 MED ORDER — INSULIN GLARGINE 100 UNIT/ML ~~LOC~~ SOLN
9.0000 [IU] | Freq: Every day | SUBCUTANEOUS | Status: DC
  Filled 2016-10-26: qty 0.09

## 2016-10-26 MED ORDER — INSULIN GLARGINE 100 UNIT/ML ~~LOC~~ SOLN
7.0000 [IU] | Freq: Every day | SUBCUTANEOUS | Status: DC
  Filled 2016-10-26: qty 0.07

## 2016-10-26 MED ORDER — ENOXAPARIN SODIUM 60 MG/0.6ML ~~LOC~~ SOLN
60.0000 mg | SUBCUTANEOUS | Status: DC
  Administered 2016-10-26 – 2016-10-28 (×3): 60 mg via SUBCUTANEOUS
  Filled 2016-10-26 (×3): qty 0.6

## 2016-10-26 MED ORDER — INSULIN GLARGINE 100 UNIT/ML ~~LOC~~ SOLN
10.0000 [IU] | Freq: Every day | SUBCUTANEOUS | Status: DC
  Administered 2016-10-27 (×2): 10 [IU] via SUBCUTANEOUS
  Filled 2016-10-26 (×4): qty 0.1

## 2016-10-26 MED ORDER — MAGNESIUM OXIDE 400 (241.3 MG) MG PO TABS
200.0000 mg | ORAL_TABLET | Freq: Every evening | ORAL | Status: DC
  Administered 2016-10-26 – 2016-10-28 (×3): 200 mg via ORAL
  Filled 2016-10-26 (×3): qty 1

## 2016-10-26 NOTE — Progress Notes (Signed)
Family Medicine Teaching Service Daily Progress Note Intern Pager: [phone]  Patient name: Elliott Dilauro Medical record number: [id number] Date of birth: December 10, 1955 Age: 61 y.o. Gender: female  Primary Care Provider: Moses Manners, MD Consultants: none Code Status: DNR  Pt Overview and Major Events to Date:  6/23: admit for sepsis likely 2/2 UTI  Assessment and Plan: Taylor Jerez is a 61 y.o. female presenting with chills and clogged indwelling Foley. PMH is significant for multiple sclerosis with spastic paraplegia, chronic prednisone use, indwelling chronic Foley, left lower extremity cellulitis, HTN, T2DM.  Sepsis likely 2/2 UTI, improved: O2 sat 80% on RA. Afebrile overnight. Vitals stable this morning. Lactic acidosis resolved. WBC improved to 10.6 from 13.8.  - Telemetry - Continue vanc/cefepime (patient has grown MDR Klebsiella and Proteus in the past) - Continuous pulse ox - Blood and urine cultures pending - dc IVF due to improved PO intake and LE edema  - Holding home Macrobid for UTI prophylaxis - I/Os to measure urinary output  AK, resolved: Cr 0.9 today.  Cr 1.04 on admission,  from baseline of 0.8-0.9. Likely prerenal and 2/2 sepsis.  - monitor - avoid nephrotoxic meds  Shortness of Breath: Noticed last night with dry cough.  CXR done but not read. Nursing notes of desaturation last night.  - will ask radiology to read CXR - will start Lasix 20 mg daily (at reduced dose)  - repeat CXR tomorrow   Multiple Sclerosis with spastic paraplegia on chronic steroids:Followed by [facility] neurology. Was previously on immunemodulator, but was unable to afford this after becoming disabled. Currently taking 30mg  prednisone at home. - Continue home Valium, gabapentin, Robaxin, Tramadol - Continue home oxybutynin for neurogenic bladder - continue increased dose prednisone to 60mg  qd for stress dosing  HTN: Stable. On Lasix at home.  -Lasix 20mg  (reduced dose):  monitor BP   Hx of adrenal insufficiency: Hypotensive here though likely due to sepsis rather than adrenal insufficiency. On prednisone 30mg  qd at home.  - continue increased dose prednisone to 60mg  qd for stress-dosing   T2DM:On Lantus 10 units qhsand Novolog 5units TID AC at home. Last A1C 7.5 in 05/18. - increase Lantus to 7U qhs (anticipate increased requirement in the setting of stress dose steroid)  - ordered home Novolog 5 units TID starting this morning - m SSI  - CBG qACHS  Chronic Lower Extremity Wounds: no signs of infection - Wound Care consult   FEN/GI: carb-modified heart healthy diet Prophylaxis: Lovenox  Disposition: transfer to medsurg  Subjective:  Doing better. No more chills or aches. Reports she did notice a dry cough yesterday and notices mild shortness of breath with speaking.    Objective: Temp:  [97.8 F (36.6 C)-100.4 F (38 C)] 98.4 F (36.9 C) (06/23 0834) Pulse Rate:  [39-107] 96 (06/23 0834) Resp:  [7-20] 18 (06/23 0834) BP: (53-134)/(39-108) 131/87 (06/23 0834) SpO2:  [80 %-98 %] 97 % (06/23 0834) Weight:  [300 lb (136.1 kg)] 300 lb (136.1 kg) (06/22 1800) Physical Exam: General: NAD, obese woman in bed Cardiovascular: RRR, no m/r/g  Respiratory: normal effort when resting. When speaking does seem slightly short of breath. Crackles noted bilaterally with L > R.  Abdomen: abdominal obesity. Mild tenderness to palpation in the suprapubic region. No guarding or rebound.  Extremities: LE edema bilaterally, chronic wounds on the posterior lower extremities bilaterally without sings of infection.   Laboratory:  Recent Labs Lab 10/23/16 1857 10/25/16 1448 10/26/16 0049  WBC 14.2* 13.8* 10.6*  HGB 13.8 11.6* 11.0*  HCT 43.3 36.9 36.0  PLT 220 170 171    Recent Labs Lab 10/23/16 1857 10/25/16 1448 10/26/16 0049  NA 139 135 137  K 4.3 4.1 3.9  CL 92* 92* 95*  CO2 33* 31 32  BUN 29* 21* 22*  CREATININE 0.79 1.04* 0.92  CALCIUM  9.5 8.7* 8.2*  GLUCOSE 310* 453* 277*    Palma Holter, MD 10/26/2016, 9:07 AM PGY-2, [place] [facility] FPTS Intern pager: [phone], text pages welcome

## 2016-10-27 ENCOUNTER — Inpatient Hospital Stay (HOSPITAL_COMMUNITY): Payer: PPO

## 2016-10-27 ENCOUNTER — Telehealth: Payer: Self-pay

## 2016-10-27 DIAGNOSIS — R531 Weakness: Secondary | ICD-10-CM

## 2016-10-27 LAB — BASIC METABOLIC PANEL
Anion gap: 7 (ref 5–15)
BUN: 19 mg/dL (ref 6–20)
CO2: 33 mmol/L — AB (ref 22–32)
Calcium: 8.9 mg/dL (ref 8.9–10.3)
Chloride: 98 mmol/L — ABNORMAL LOW (ref 101–111)
Creatinine, Ser: 0.91 mg/dL (ref 0.44–1.00)
GFR calc Af Amer: >60 mL/min (ref >60)
GFR calc non Af Amer: >60 mL/min (ref >60)
GLUCOSE: 235 mg/dL — AB (ref 65–99)
POTASSIUM: 3.5 mmol/L (ref 3.5–5.1)
SODIUM: 138 mmol/L (ref 135–145)

## 2016-10-27 LAB — CBC
HEMATOCRIT: 35.5 % — AB (ref 36.0–46.0)
HEMOGLOBIN: 11 g/dL — AB (ref 12.0–15.0)
MCH: 28.1 pg (ref 26.0–34.0)
MCHC: 31 g/dL (ref 30.0–36.0)
MCV: 90.8 fL (ref 78.0–100.0)
Platelets: 158 10*3/uL (ref 150–400)
RBC: 3.91 MIL/uL (ref 3.87–5.11)
RDW: 17.7 % — ABNORMAL HIGH (ref 11.5–15.5)
WBC: 8.9 10*3/uL (ref 4.0–10.5)

## 2016-10-27 LAB — TROPONIN I

## 2016-10-27 LAB — GLUCOSE, CAPILLARY
GLUCOSE-CAPILLARY: 179 mg/dL — AB (ref 65–99)
GLUCOSE-CAPILLARY: 241 mg/dL — AB (ref 65–99)
GLUCOSE-CAPILLARY: 382 mg/dL — AB (ref 65–99)
Glucose-Capillary: 397 mg/dL — ABNORMAL HIGH (ref 65–99)

## 2016-10-27 MED ORDER — FUROSEMIDE 40 MG PO TABS
40.0000 mg | ORAL_TABLET | Freq: Every day | ORAL | Status: DC
  Administered 2016-10-28 – 2016-10-29 (×2): 40 mg via ORAL
  Filled 2016-10-27 (×2): qty 1

## 2016-10-27 MED ORDER — FUROSEMIDE 10 MG/ML IJ SOLN
20.0000 mg | Freq: Once | INTRAMUSCULAR | Status: AC
  Administered 2016-10-27: 20 mg via INTRAVENOUS
  Filled 2016-10-27: qty 2

## 2016-10-27 MED ORDER — POLYETHYLENE GLYCOL 3350 17 G PO PACK
17.0000 g | PACK | Freq: Every day | ORAL | Status: DC
  Administered 2016-10-27: 17 g via ORAL
  Filled 2016-10-27: qty 1

## 2016-10-27 MED ORDER — POTASSIUM CHLORIDE CRYS ER 20 MEQ PO TBCR
40.0000 meq | EXTENDED_RELEASE_TABLET | Freq: Once | ORAL | Status: AC
  Administered 2016-10-27: 40 meq via ORAL
  Filled 2016-10-27: qty 2

## 2016-10-27 MED ORDER — SENNA 8.6 MG PO TABS
1.0000 | ORAL_TABLET | Freq: Every day | ORAL | Status: DC
  Administered 2016-10-27 – 2016-10-28 (×2): 8.6 mg via ORAL
  Filled 2016-10-27: qty 1

## 2016-10-27 MED ORDER — PHENAZOPYRIDINE HCL 100 MG PO TABS
100.0000 mg | ORAL_TABLET | Freq: Three times a day (TID) | ORAL | Status: DC
  Administered 2016-10-27 – 2016-10-29 (×7): 100 mg via ORAL
  Filled 2016-10-27 (×7): qty 1

## 2016-10-27 MED ORDER — FUROSEMIDE 20 MG PO TABS
20.0000 mg | ORAL_TABLET | Freq: Once | ORAL | Status: AC
  Administered 2016-10-27: 20 mg via ORAL
  Filled 2016-10-27: qty 1

## 2016-10-27 NOTE — Progress Notes (Signed)
Family Medicine Teaching Service Daily Progress Note Intern Pager: [phone]  Patient name: Barbara [name] Medical record number: [id number] Date of birth: 1956-03-31 Age: 61 y.o. Gender: female  Primary Care Provider: Moses Manners, MD Consultants: none Code Status: DNR  Pt Overview and Major Events to Date:  6/23: admit for sepsis likely 2/2 UTI  Assessment and Plan: Barbara [name] is a 61 y.o. female presenting with chills and clogged indwelling Foley. PMH is significant for multiple sclerosis with spastic paraplegia, chronic prednisone use, indwelling chronic Foley, left lower extremity cellulitis, HTN, T2DM.  Sepsis likely 2/2 UTI, improved: O2 sat 80% on RA. Afebrile overnight. Vitals stable this morning.  Leukocytosis resolved. Urine culture with >100,000 colonies of proteus, sensitive to cephalosporins. Blood culture NG x 2 days - Telemetry - Continue cefepime (clinically she does not feel better today)  - discontinue Vanc - Continuous pulse ox - Blood culture  - urine culture sensitivities   - Holding home Macrobid for UTI prophylaxis - I/Os to measure urinary output  Lower Abdominal Tenderness: TTP mainly in the RLQ and suprapubic region. No rebound. Positive bowel sounds. Unclear in etiology.  - x-ray abdomen   Patient concern for vaginal bleeding: patient reports that she went to the ED because she had what she thought was vaginal bleeding. Per chart review, when foley was re-inserted in the ED initially saw red colored urine, then clear urine. But patient reports it seemed to come from her vagina. No bleeding noted on external exam and no bloody urine.   - monitor   AKI, resolved: Cr 0.9 today.  Cr 1.04 on admission,  from baseline of 0.8-0.9. Likely prerenal and 2/2 sepsis.  - monitor - avoid nephrotoxic meds  Shortness of Breath: Noticed last night with dry cough.  CXR  On 6/22 with no acute findings. On 1.5L [place].  - Lasix 40mg  (increase to home dose)     Multiple Sclerosis with spastic paraplegia on chronic steroids:Followed by [facility] neurology. Was previously on immunemodulator, but was unable to afford this after becoming disabled. Currently taking 30mg  prednisone at home. - Continue home Valium, gabapentin, Robaxin, Tramadol - Continue home oxybutynin for neurogenic bladder - continue increased dose prednisone to 60mg  qd for stress dosing  HTN: Stable. On Lasix at home.  -Lasix 40: monitor BP   Hx of adrenal insufficiency: Hypotensive here though likely due to sepsis rather than adrenal insufficiency. On prednisone 30mg  qd at home.  - continue increased dose prednisone to 60mg  qd for stress-dosing   T2DM:Elevated CBGs in the setting of stress dose steroid. On Lantus 10 units qhsand Novolog 5units TID AC at home. Last A1C 7.5 in 05/18. - Lantus to 10U qhs (anticipate increased requirement in the setting of stress dose steroid)  - ordered home Novolog 5 units TID starting this morning - m SSI  - CBG qACHS  Chronic Lower Extremity Wounds: no signs of infection - Wound Care consult   FEN/GI: carb-modified heart healthy diet Prophylaxis: Lovenox  Disposition: pending sensitivities to transition to PO antibiotics   Subjective:  Reports she doesn't feel as well as yesterday but cannot specifically tell me how. Her breathing has not improved from yesterday. She reports she continues to have lower abdominal pain but this is stable.    Objective: Temp:  [97.6 F (36.4 C)-98.4 F (36.9 C)] 97.8 F (36.6 C) (06/23 2343) Pulse Rate:  [80-96] 80 (06/23 2343) Resp:  [13-18] 18 (06/23 2343) BP: (117-131)/(70-87) 127/85 (06/23 2343) SpO2:  [91 %-97 %]  94 % (06/23 2343) Physical Exam: General: NAD, obese woman in bed Cardiovascular: RRR, no m/r/g  Respiratory: normal effort when resting. When speaking does seem slightly short of breath. Crackles bilaterally seems slightly improved from yesterday.  Abdomen: abdominal  obesity. Mild tenderness to palpation in the suprapubic region and the RLQ.   Extremities: LE edema bilaterally, chronic wounds on the posterior lower extremities bilaterally without sings of infection.   Laboratory:  Recent Labs Lab 10/25/16 1448 10/26/16 0049 10/27/16 0321  WBC 13.8* 10.6* 8.9  HGB 11.6* 11.0* 11.0*  HCT 36.9 36.0 35.5*  PLT 170 171 158    Recent Labs Lab 10/25/16 1448 10/26/16 0049 10/27/16 0321  NA 135 137 138  K 4.1 3.9 3.5  CL 92* 95* 98*  CO2 31 32 33*  BUN 21* 22* 19  CREATININE 1.04* 0.92 0.91  CALCIUM 8.7* 8.2* 8.9  GLUCOSE 453* 277* 235*    Palma Holter, MD 10/27/2016, 6:01 AM PGY-2, [place] [facility] FPTS Intern pager: [phone], text pages welcome

## 2016-10-27 NOTE — Plan of Care (Signed)
Problem: Safety: Goal: Ability to remain free from injury will improve Outcome: Progressing Importance of calling for assistance stressed to patient.  Patient verbalized understanding.  Correct use of call bell demonstrated throughout pm shift.  Patient progressing towards goal.

## 2016-10-27 NOTE — Progress Notes (Signed)
Paged MD concerning patient complaints of not feeling well all day. Very short of breath when trying to talk. MD responded and wants to give one time dose of Lasix 20mg .

## 2016-10-27 NOTE — Telephone Encounter (Signed)
Pt with + UC admitted to hospital and on Cepfepine  OK per [facility] Pharm D

## 2016-10-28 LAB — BASIC METABOLIC PANEL
Anion gap: 11 (ref 5–15)
BUN: 18 mg/dL (ref 6–20)
CALCIUM: 9.1 mg/dL (ref 8.9–10.3)
CO2: 32 mmol/L (ref 22–32)
CREATININE: 0.87 mg/dL (ref 0.44–1.00)
Chloride: 95 mmol/L — ABNORMAL LOW (ref 101–111)
GFR calc non Af Amer: >60 mL/min (ref >60)
GLUCOSE: 259 mg/dL — AB (ref 65–99)
Potassium: 3.6 mmol/L (ref 3.5–5.1)
Sodium: 138 mmol/L (ref 135–145)

## 2016-10-28 LAB — CBC
HEMATOCRIT: 35.6 % — AB (ref 36.0–46.0)
Hemoglobin: 11.2 g/dL — ABNORMAL LOW (ref 12.0–15.0)
MCH: 28.6 pg (ref 26.0–34.0)
MCHC: 31.5 g/dL (ref 30.0–36.0)
MCV: 90.8 fL (ref 78.0–100.0)
Platelets: 189 10*3/uL (ref 150–400)
RBC: 3.92 MIL/uL (ref 3.87–5.11)
RDW: 17.3 % — AB (ref 11.5–15.5)
WBC: 9.3 10*3/uL (ref 4.0–10.5)

## 2016-10-28 LAB — GLUCOSE, CAPILLARY
Glucose-Capillary: 178 mg/dL — ABNORMAL HIGH (ref 65–99)
Glucose-Capillary: 241 mg/dL — ABNORMAL HIGH (ref 65–99)
Glucose-Capillary: 282 mg/dL — ABNORMAL HIGH (ref 65–99)
Glucose-Capillary: 224 mg/dL — ABNORMAL HIGH (ref 65–99)

## 2016-10-28 LAB — URINE CULTURE: Special Requests: NORMAL

## 2016-10-28 LAB — TROPONIN I
Troponin I: <0.03 ng/mL (ref <0.03)
Troponin I: <0.03 ng/mL (ref <0.03)

## 2016-10-28 MED ORDER — SENNOSIDES-DOCUSATE SODIUM 8.6-50 MG PO TABS
1.0000 | ORAL_TABLET | Freq: Every day | ORAL | Status: DC
  Administered 2016-10-28: 1 via ORAL
  Filled 2016-10-28: qty 1

## 2016-10-28 MED ORDER — CIPROFLOXACIN HCL 500 MG PO TABS
500.0000 mg | ORAL_TABLET | Freq: Two times a day (BID) | ORAL | Status: DC
  Administered 2016-10-28 – 2016-10-29 (×2): 500 mg via ORAL
  Filled 2016-10-28 (×3): qty 1

## 2016-10-28 MED ORDER — INSULIN GLARGINE 100 UNIT/ML ~~LOC~~ SOLN
15.0000 [IU] | Freq: Every day | SUBCUTANEOUS | Status: DC
  Administered 2016-10-28: 15 [IU] via SUBCUTANEOUS
  Filled 2016-10-28 (×2): qty 0.15

## 2016-10-28 MED ORDER — AMOXICILLIN 500 MG PO CAPS
500.0000 mg | ORAL_CAPSULE | Freq: Two times a day (BID) | ORAL | Status: DC
  Administered 2016-10-28 – 2016-10-29 (×2): 500 mg via ORAL
  Filled 2016-10-28 (×3): qty 1

## 2016-10-28 NOTE — Progress Notes (Signed)
Inpatient Diabetes Program Recommendations  AACE/ADA: New Consensus Statement on Inpatient Glycemic Control (2015)  Target Ranges:  Prepandial:   less than 140 mg/dL      Peak postprandial:   less than 180 mg/dL (1-2 hours)      Critically ill patients:  140 - 180 mg/dL   Results for Barbara [name], Barbara [name] (MRN [id number]) as of 10/28/2016 12:04  Ref. Range 10/27/2016 08:38 10/27/2016 12:03 10/27/2016 17:23 10/27/2016 20:35 10/28/2016 08:02  Glucose-Capillary Latest Ref Range: 65 - 99 mg/dL 715 (H) 7 units given 953 (H) 10 units given 397 (H) 20 units given 382 (H) 178 (H)   Review of Glycemic Control  Diabetes history: DM 2 Outpatient Diabetes medications: Lantus 10 units QHS, Novolog 5 units TID, Prednisone 30 mg Daily Current orders for Inpatient glycemic control: Lantus 10 units, Novolog Moderate tid + Novolog 5 units tid meal coverage  Inpatient Diabetes Program Recommendations:     Glucose significantly after meals due to steroids. Please consider increase in Novolog Meal Coverage 10 units tid.   Thanks,  Christena Deem RN, MSN, [facility] Inpatient Diabetes Coordinator Team Pager [phone] (8a-5p)

## 2016-10-28 NOTE — Consult Note (Signed)
   [facility] Inpatient Consult   10/28/2016  Barbara [name] 1956/02/05 [id number]   Patient is currently active with [facility] Care Management for chronic disease management services.  Patient has been engaged by a [employer].  Our community based plan of care has focused on disease management and community resource support.  Patient is currently admitted for sepsis likely due to UTI, HX of T2DM, Multiple Sclerosis with spastic paraplegia.   Made Inpatient Case Manager aware that [facility] Care Management following. Chart review of Dr. Cyndia Skeeters notes reveals the patient  Could benefit from a palliative care consult.  Will follow for progress.   Of note, [facility] Care Management services does not replace or interfere with any services that are needed or arranged by inpatient case management or social work.  For additional questions or referrals please contact:  Charlesetta Shanks, RN BSN CCM [facility]  [phone] business mobile phone Toll free office [phone]

## 2016-10-28 NOTE — Consult Note (Signed)
WOC Nurse wound consult note Reason for Consult: bilateral LE ulcers Patient known to this WOC nurse from previous admissions. She has bilateral LE edema and venous stasis. Managed at home with Unna's boots per [facility] Wound type: ruptured blisters, bilateral posterior calf. Pressure Injury POA: No Measurement: RLE: 3cm x 3cm x 0.1cm;partial thickness LLE: 5cm x 6cm x 0.1cm; partial thickness  Wound KPT:WSFK are clean, pink, moist.  Appear to be ruptured blisters with loss of blister roof Drainage (amount, consistency, odor) serous Periwound: intact, edematous, with some brawny skin changes  Dressing procedure/placement/frequency: Foam dressings to the open areas to protect and absorb drainage. Ortho techs to place bilateral Unna's boots and change every Mon/Thursday. Will need to resume HHRN for management of LE edema and wound care.   Discussed POC with patient and bedside nurse.  Re consult if needed, will not follow at this time. Thanks  Sariya Trickey [employer], RN,CWOCN, CNS, CWON-AP [phone])

## 2016-10-28 NOTE — Progress Notes (Signed)
Family Medicine Teaching Service Daily Progress Note Intern Pager: [phone]  Patient name: Barbara [name] Medical record number: [id number] Date of birth: 1955-06-29 Age: 61 y.o. Gender: female  Primary Care Provider: Moses Manners, MD Consultants: none Code Status: DNR  Pt Overview and Major Events to Date:  6/23: admit for sepsis likely 2/2 UTI  Assessment and Plan: Barbara [name] is a 61 y.o. female presenting with chills and clogged indwelling Foley. PMH is significant for multiple sclerosis with spastic paraplegia, chronic prednisone use, indwelling chronic Foley, left lower extremity cellulitis, HTN, T2DM.  Sepsis likely 2/2 UTI, improved: O2 sat 80% on RA, 97% on 2.5L [place]. Afebrile and VSS  Leukocytosis resolved. Urine culture with >100K colonies of proteus, sensitive to amoxicillin but resistance to macrobid/cipro, as well as 50K colonies of pseudomonas sensitive to ciprofloxacin. - Telemetry  - Deescalate from cefepime (6/22-6/25) to po amoxicillin and ciprofloxacin. Will need 7 day total of ABX, last dose 10/31/16 - Continuous pulse ox - follow Blood cultures NG x 2 days - Holding home Macrobid for UTI prophylaxis - I/Os to measure UOP, 1.9 cc/kg/hr over last 24h  Lower Abdominal Tenderness: Likely 2/2 constipation. TTP mainly in suprapubic region. No rebound. Positive bowel sounds. Abd Xray nonobstructive gas pattern with mod fecal retention over the transverse and descending colon - Continue miralax and senna, add docusate today  Patient concern for vaginal bleeding: patient reports that she went to the ED because she had what she thought was vaginal bleeding. Per chart review, when foley was re-inserted in the ED initially saw red colored urine, then clear urine. But patient reports it seemed to come from her vagina. No bleeding noted on external exam and no bloody urine.   - monitor   AKI, resolved: Cr 0.9 today.  Cr 1.04 on admission,  from baseline of 0.8-0.9.  Likely prerenal and 2/2 sepsis.  - monitor - avoid nephrotoxic meds  Shortness of Breath, resolved: Noticed last night with dry cough. CXR showing mild pulm vascular congestion. On 1.5L [place], not on O2 at home - Lasix po 40mg  qd  - wean to room air    Multiple Sclerosis with spastic paraplegia on chronic steroids:Followed by [facility] neurology. Was previously on immunemodulator, but was unable to afford this after becoming disabled. Currently taking 30mg  prednisone at home. - Continue home Valium, gabapentin, Robaxin, Tramadol - Continue home oxybutynin for neurogenic bladder - continue increased dose prednisone to 60mg  qd for stress dosing  HTN: Stable. On Lasix at home.  -Lasix 40: monitor BP   Hx of adrenal insufficiency: Hypotensive here though likely due to sepsis rather than adrenal insufficiency. On prednisone 30mg  qd at home.  - continue increased dose prednisone to 60mg  qd for stress-dosing   T2DM:Elevated CBGs in the setting of stress dose steroid. On Lantus 10 units qhsand Novolog 5units TID AC at home. Last A1C 7.5 in 05/18. - Lantus to 10U qhs (anticipate increased requirement in the setting of stress dose steroid)  - ordered home Novolog 5 units TID starting this morning - m SSI  - CBG qACHS  Chronic Lower Extremity Wounds: no signs of infection - Wound Care consult   FEN/GI: carb-modified heart healthy diet Prophylaxis: Lovenox  Disposition: monitor overnight on po ABX, home tomorrow   Subjective:  Reports she doesn't feel as well as yesterday but cannot specifically tell me how. Her breathing has not improved from yesterday. She reports she continues to have lower abdominal pain but this is stable.  Objective: Temp:  [97.8 F (36.6 C)-98 F (36.7 C)] 98 F (36.7 C) (06/24 2040) Pulse Rate:  [84-88] 84 (06/24 2040) Resp:  [16-18] 18 (06/24 2040) BP: (138-152)/(84-85) 138/85 (06/24 2040) SpO2:  [92 %-97 %] 97 % (06/24 2040) Physical  Exam: General: NAD, obese woman in bed Cardiovascular: RRR, no m/r/g  Respiratory:  CTAB, normal effort. No crackles or wheezes Abdomen: abdominal obesity. Mild tenderness to palpation in the suprapubic region but soft, + bowel sounds Extremities: LE edema bilaterally, chronic wounds on the posterior lower extremities bilaterally without sings of infection.   Laboratory:  Recent Labs Lab 10/26/16 0049 10/27/16 0321 10/28/16 0031  WBC 10.6* 8.9 9.3  HGB 11.0* 11.0* 11.2*  HCT 36.0 35.5* 35.6*  PLT 171 158 189    Recent Labs Lab 10/26/16 0049 10/27/16 0321 10/28/16 0031  NA 137 138 138  K 3.9 3.5 3.6  CL 95* 98* 95*  CO2 32 33* 32  BUN 22* 19 18  CREATININE 0.92 0.91 0.87  CALCIUM 8.2* 8.9 9.1  GLUCOSE 277* 235* 259*    Dg Abd 1 View  Result Date: 10/27/2016 CLINICAL DATA:  Lower abdominal pain and tenderness. EXAM: ABDOMEN - 1 VIEW COMPARISON:  CT 09/25/2016 FINDINGS: Bowel gas pattern is nonobstructive with moderate fecal retention over the transverse and descending colon. Small caliber rectal catheter noted. No free peritoneal air. Degenerative changes of the spine and hips. IMPRESSION: Nonobstructive bowel gas pattern with moderate fecal retention over the transverse and descending colon. Electronically Signed   By: Elberta Fortis M.D.   On: 10/27/2016 14:46    Leland Her, DO 10/28/2016, 7:14 AM PGY-1, [facility] FPTS Intern pager: [phone], text pages welcome

## 2016-10-28 NOTE — Progress Notes (Signed)
What started out as a social visit morphed into a goals of care discussion.  Barbara [name] is now 53 and bedridden due mostly to multiple sclerosis with morbid obesity contributing.   Briefly, Barbara [name] is not having any fun.  She recognizes that she has entered the last chapter of her life.  Her husband is dead and she has no children.  She does have siblings who she loves and who love her.  She lives at home with support but not 24 hour assistance.    She is already a "no code blue."  She knows that we recommend SNF as the safest dispo for her.  She has been in the nursing home x 2 and really does not want to go back.  Her only reason for going would be to rehab and gain some improved independence.    She has a strong faith and is certain a better place awaits.  I appreciate her humility as she struggles to understand God's will.    We started a nice discussion about her prerogatives up to and including refusing further antibiotic therapy.  We reached no firm decisions today except that we would meet again tomorrow and together complete a MOST form.  I will do so with her tomorrow.

## 2016-10-28 NOTE — Progress Notes (Signed)
Orthopedic Tech Progress Note Patient Details:  Barbara [name] 11/23/1955 [id number]  Ortho Devices Type of Ortho Device: Roland Rack boot Ortho Device/Splint Location: Bilateral unna boots Ortho Device/Splint Interventions: Application   Saul Fordyce 10/28/2016, 2:55 PM

## 2016-10-29 DIAGNOSIS — Z515 Encounter for palliative care: Secondary | ICD-10-CM

## 2016-10-29 LAB — BASIC METABOLIC PANEL
Anion gap: 10 (ref 5–15)
BUN: 16 mg/dL (ref 6–20)
CHLORIDE: 98 mmol/L — AB (ref 101–111)
CO2: 34 mmol/L — ABNORMAL HIGH (ref 22–32)
CREATININE: 0.79 mg/dL (ref 0.44–1.00)
Calcium: 9.4 mg/dL (ref 8.9–10.3)
GFR calc Af Amer: >60 mL/min (ref >60)
GFR calc non Af Amer: >60 mL/min (ref >60)
GLUCOSE: 132 mg/dL — AB (ref 65–99)
Potassium: 3.3 mmol/L — ABNORMAL LOW (ref 3.5–5.1)
Sodium: 142 mmol/L (ref 135–145)

## 2016-10-29 LAB — CBC
HCT: 36.4 % (ref 36.0–46.0)
Hemoglobin: 11.1 g/dL — ABNORMAL LOW (ref 12.0–15.0)
MCH: 28.3 pg (ref 26.0–34.0)
MCHC: 30.5 g/dL (ref 30.0–36.0)
MCV: 92.9 fL (ref 78.0–100.0)
Platelets: 177 10*3/uL (ref 150–400)
RBC: 3.92 MIL/uL (ref 3.87–5.11)
RDW: 17.2 % — ABNORMAL HIGH (ref 11.5–15.5)
WBC: 9.3 10*3/uL (ref 4.0–10.5)

## 2016-10-29 LAB — GLUCOSE, CAPILLARY
Glucose-Capillary: 126 mg/dL — ABNORMAL HIGH (ref 65–99)
Glucose-Capillary: 228 mg/dL — ABNORMAL HIGH (ref 65–99)

## 2016-10-29 MED ORDER — POTASSIUM CHLORIDE CRYS ER 20 MEQ PO TBCR
40.0000 meq | EXTENDED_RELEASE_TABLET | Freq: Once | ORAL | Status: AC
  Administered 2016-10-29: 40 meq via ORAL
  Filled 2016-10-29: qty 2

## 2016-10-29 MED ORDER — CIPROFLOXACIN HCL 500 MG PO TABS: 500.0000 mg | ORAL_TABLET | Freq: Two times a day (BID) | ORAL | 0 refills | Status: AC

## 2016-10-29 MED ORDER — AMOXICILLIN 500 MG PO CAPS: 500.0000 mg | ORAL_CAPSULE | Freq: Two times a day (BID) | ORAL | 0 refills | Status: AC

## 2016-10-29 MED FILL — CIPROFLOXACIN HCL 500 MG TA: 500 | 5 days supply | Qty: 10 | Fill #0

## 2016-10-29 MED FILL — AMOXICILLIN 500 MG CAPSULE: 500 | 5 days supply | Qty: 10 | Fill #0

## 2016-10-29 NOTE — Progress Notes (Signed)
Follow up of yesterday's goals of care discussion.  A tangible result of this encounter is that the patient and I completed a MOST form.  Given to her RN to scan to the chart with the original going back to the patient.  Note: She told me that she is having increasing problems with choking on food.  This is an ominous sign for any progressive neurologic disease.  Regardless of Barbara [name] choices around goals of care, aspiration can mark the end stage.  We shall see.  Discussion: In a pattern similar to other patients with chronic, progressive illnesses, Barbara [name] is traveling down the continuum from full aggressive medical care to full comfort care.  At present, she is in the middle of that continuum.  Barbara [name] MOST form check boxes reflect that middle ground.  She is firm on the following:  No code/DNR  DNI - do not intubate and no mechanical ventolation.  No feeding tube  No dialysis.  Avoid ICU care  For now, she is OK with hospitalization, antibiotics and iv fluids.  She does want to go home and ponder these points further.  She is fully aware that she can change Barbara [name] mind at any time.  She foresees limiting care even further as time progresses.  She is not yet ready to make the leap to comfort care only.

## 2016-10-29 NOTE — Care Management Important Message (Signed)
Important Message  Patient Details  Name: Barbara [name] MRN: [id number] Date of Birth: 30-May-1955   Medicare Important Message Given:  Yes    Kyla Balzarine 10/29/2016, 9:52 AM

## 2016-10-29 NOTE — Progress Notes (Signed)
Family Medicine Teaching Service Daily Progress Note Intern Pager: [phone]  Patient name: Barbara [name] Medical record number: [id number] Date of birth: [birth year] Age: 61 y.o. Gender: female  Primary Care Provider: Moses Manners, MD Consultants: none Code Status: DNR  Pt Overview and Major Events to Date:  6/23: admit for sepsis likely 2/2 UTI  Assessment and Plan: Barbara [name] is a 61 y.o. female presenting with chills and clogged indwelling Foley. PMH is significant for multiple sclerosis with spastic paraplegia, chronic prednisone use, indwelling chronic Foley, left lower extremity cellulitis, HTN, T2DM.  Sepsis 2/2 UTI, improved: Afebrile and VSS. Leukocytosis resolved. Urine culture with >100K colonies proteus, sensitive to amoxicillin but resistance to macrobid/cipro, as well as 50K colonies pseudomonas sensitive to ciprofloxacin. - Cefepime (6/22-6/25) to po amoxicillin and ciprofloxacin(6/25-). Will need 10 day total of ABX, last dose 11/03/16 - follow Blood cultures NG x 3 days - Holding home Macrobid for UTI prophylaxis, may benefit from suprapubic catheter as opposed to ABX ppx  Lower Abdominal Tenderness, resolved: Likely 2/2 constipation. Nontender to palpation. Positive bowel sounds. Abd Xray nonobstructive gas pattern with mod fecal retention over the transverse and descending colon - Continue miralax and senna-docusate   Patient concern for vaginal bleeding: patient reports that she went to the ED because she had what she thought was vaginal bleeding. Per chart review, when foley was re-inserted in the ED initially saw red colored urine, then clear urine. But patient reports it seemed to come from her vagina. No bleeding noted on external exam and no bloody urine.   - monitor, has ha no further episodes  AKI, resolved: Cr at baseline of 0.8-0.9. Likely prerenal and 2/2 sepsis.  - monitor - avoid nephrotoxic meds  Shortness of Breath, resolved: CXR mild pulm  vascular congestion. Improved with diuresis. Back to room air, not on O2 at home - Lasix po 40mg  qd     Multiple Sclerosis with spastic paraplegia on chronic steroids:Followed by [facility] neurology. Was previously on immunemodulator, but was unable to afford this after becoming disabled. Currently taking 30mg  prednisone at home. - Continue home Valium, gabapentin, Robaxin, Tramadol - Continue home oxybutynin for neurogenic bladder - decrease back to home dose prednisone to 30mg  qd since infection resolving  HTN: Stable. On Lasix at home.  -At home dose Lasix 40mg  po qd: monitor BP   Hx of adrenal insufficiency: On prednisone 30mg  qd at home.  - resume home dosing prednisone  T2DM:Elevated CBGs in the setting of stress dose steroid. On Lantus 10 units qhsand Novolog 5units TID AC at home. Last A1C 7.5 in 05/18. - Lantus 15U qhs (titrated in the setting of stress dose steroid, will DC on home dose)  - home Novolog 5 units TID  - mSSI  - CBG qACHS  Chronic Lower Extremity Wounds: no signs of infection - Wound Care consult   FEN/GI: carb-modified heart healthy diet Prophylaxis: Lovenox  Disposition: home today  Subjective:  States feels well this morning. Abd pain has resolved. No concerns today other than wanting to go home.    Objective: Temp:  [97.7 F (36.5 C)-98.5 F (36.9 C)] 98.5 F (36.9 C) (06/26 0622) Pulse Rate:  [70-80] 73 (06/26 0622) Resp:  [18-20] 18 (06/26 0622) BP: (109-126)/(63-83) 125/83 (06/26 0622) SpO2:  [93 %-97 %] 97 % (06/26 0622) Physical Exam: General: NAD, obese woman in bed Cardiovascular: RRR, no m/r/g  Respiratory:  CTAB, normal effort. No crackles or wheezes Abdomen: abdominal obesity. Nontender, soft, + bowel sounds  Extremities: LE edema bilaterally, chronic wounds on the posterior lower extremities bilaterally without sings of infection.   Laboratory:  Recent Labs Lab 10/26/16 0049 10/27/16 0321 10/28/16 0031  WBC 10.6*  8.9 9.3  HGB 11.0* 11.0* 11.2*  HCT 36.0 35.5* 35.6*  PLT 171 158 189    Recent Labs Lab 10/26/16 0049 10/27/16 0321 10/28/16 0031  NA 137 138 138  K 3.9 3.5 3.6  CL 95* 98* 95*  CO2 32 33* 32  BUN 22* 19 18  CREATININE 0.92 0.91 0.87  CALCIUM 8.2* 8.9 9.1  GLUCOSE 277* 235* 259*    No results found.  Leland Her, DO 10/29/2016, 7:33 AM PGY-1, [place] [facility] FPTS Intern pager: [phone], text pages welcome

## 2016-10-29 NOTE — Progress Notes (Signed)
Barbara [name] to be D/C'd Home per MD order.  Discussed with the patient and all questions fully answered.  VSS, Skin clean, dry and intact without evidence of skin break down, no evidence of skin tears noted. IV catheter discontinued intact. Site without signs and symptoms of complications. Dressing and pressure applied.  An After Visit Summary was printed and given to the patient. Patient received prescription.  D/c education completed with patient/family including follow up instructions, medication list, d/c activities limitations if indicated, with other d/c instructions as indicated by MD - patient able to verbalize understanding, all questions fully answered.   Patient instructed to return to ED, call 911, or call MD for any changes in condition.   Patient escorted via WC, and D/C home via private auto.  Eligah East 10/29/2016 4:09 PM

## 2016-10-29 NOTE — Discharge Instructions (Signed)
It has been a pleasure taking care of you! You were admitted due to a urinary tract infection. We have treated you with antibiotics. With that your symptoms improved to the point we think it is safe to let you go home and follow up with your primary care doctor. We are discharging you on amoxicillin and ciprofloxacin that you need to continue taking after you leave the hospital, your last dose will be on 11/03/16. Then resume your macrobid afterwards. There could be some changes made to your home medications during this hospitalization. Please, make sure to read the directions before you take them. The names and directions on how to take these medications are found on this discharge paper under medication section.  Please follow up at your primary care doctor's office. The address, date and time are found on the discharge paper under follow up section.

## 2016-10-29 NOTE — Discharge Summary (Signed)
Barbara [name] Discharge Summary  Patient name: Barbara [name] Medical record number: [id number] Date of birth: 1955-12-17 Age: 61 y.o. Gender: female Date of Admission: 10/25/2016  Date of Discharge: 10/29/16 Admitting Physician: Kinnie Feil, MD  Primary Care Provider: Zenia Resides, MD Consultants: none  Indication for Hospitalization: Sepsis 2/2 UTI  Discharge Diagnoses/Problem List:  Patient Active Problem List   Diagnosis Date Noted  . Arterial hypotension   . Lower abdominal tenderness   . AKI (acute kidney injury) ([place])   . Leukocytosis   . Cellulitis 09/24/2016  . Shortness of breath   . Hypoxia   . Acute cystitis without hematuria   . Septic shock ([place])   . Sepsis due to urinary tract infection ([place]) 07/16/2016  . Cellulitis of left lower extremity   . Lower extremity weakness 04/02/2016  . Decubitus ulcer of sacral region   . Impaired mobility and activities of daily living 03/07/2016  . Palliative care encounter 03/07/2016  . Current chronic use of systemic steroids 03/07/2016  . DM type 2, not at goal [facility]) 03/06/2016  . Cellulitis and abscess of leg 08/28/2015  . MS (multiple sclerosis) ([place])   . Weakness of both lower extremities   . Absence of bladder continence   . Essential hypertension   . Spastic paraplegia   . Weakness 07/12/2015  . Encounter for chronic pain management 04/21/2014  . Chronic venous insufficiency 11/05/2012  . Spinal stenosis in cervical region 06/03/2011  . Multiple sclerosis ([place]) 12/07/2010  . 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 Spinal stenosis of lumbar region 12/07/2010  . Hypertension 12/07/2010  . Obesity 12/07/2010  . Chronic gout 12/07/2010    Disposition: Home  Discharge Condition: Stable, improved  Discharge Exam:  General: NAD, obese woman in bed Cardiovascular: RRR, no m/r/g  Respiratory:  CTAB, normal effort. No crackles or wheezes Abdomen: abdominal obesity. Nontender, soft, +  bowel sounds Extremities: LE edema bilaterally, chronic wounds on the posterior lower extremities bilaterally without signs of infection.   Brief [name] Course:  Barbara [name] a 61 y.o.femalePMH significant for multiple sclerosis with spastic paraplegia, chronic prednisone use, indwelling chronic Foley, left lower extremity cellulitis, HTN, T2DM who presented with chills and clogged indwelling Foley with recent initiation of macrobid ppx as outpatient. She met sepsis criteria 2/2 UTI and was started on vancomycin and cefepime. Patient was known to have MDR Klebsiella and Proteus in the past. Due to her h/o of adrenal insufficiency, her home prednisone was increased to stress dosing and given her corresponding increased insulin requirement was adjusted. She improved clinically and her urine culture grew >100K colonies proteus, sensitive to amoxicillin but resistance to macrobid/cipro, as well as 50K colonies pseudomonas sensitive to ciprofloxacin. She was therefore deescalated to both amoxicillin and ciprofloxacin to complete a 10 day total course of ABX. She was then instructed to resume her prophylactic macrobid because she had only had a few days before this admission. Of note, during her [name] stay, she did develop some shortness of breath with mild pulmonary edema on CXR. She received one dose of IV lasix and improved significantly and did well during the remainder of her [name] stay on her home dose of po lasix.  Issues for Follow Up:  1. Patient should finish 10 day total course of antibiotics for UTI. Last dose of both po amoxicillin and ciprofloxacin is 11/03/16. 2. If patient has repeated UTIs, may benefit from suprapubic catheter rather than prophylatic ABX.  3. On discharge, patient's  stress dose steroids were reduced back to her home dose and her home insulin regimen was resumed as well, please follow up on blood glucose control.  Significant Procedures: none  Significant Labs  and Imaging:   Recent Labs Lab 10/27/16 0321 10/28/16 0031 10/29/16 0756  WBC 8.9 9.3 9.3  HGB 11.0* 11.2* 11.1*  HCT 35.5* 35.6* 36.4  PLT 158 189 177    Recent Labs Lab 10/25/16 1448 10/26/16 0049 10/27/16 0321 10/28/16 0031 10/29/16 0756  NA 135 137 138 138 142  K 4.1 3.9 3.5 3.6 3.3*  CL 92* 95* 98* 95* 98*  CO2 31 32 33* 32 34*  GLUCOSE 453* 277* 235* 259* 132*  BUN 21* 22* _0 CREATININE 1.04* 0.92 0.91 0.87 0.79  CALCIUM 8.7* 8.2* 8.9 9.1 9.4     Results/Tests Pending at Time of Discharge: none  Discharge Medications:  Allergies as of 10/29/2016      Reactions   Metformin And Related Diarrhea, Nausea And Vomiting      Medication List    TAKE these medications   acidophilus Caps capsule Take 1 capsule by mouth every evening.   amoxicillin 500 MG capsule Commonly known as:  AMOXIL Take 1 capsule (500 mg total) by mouth every 12 (twelve) hours.   aspirin EC 325 MG tablet Take 325 mg by mouth every evening.   ciprofloxacin 500 MG tablet Commonly known as:  CIPRO Take 1 tablet (500 mg total) by mouth 2 (two) times daily.   colchicine 0.6 MG tablet Take 0.6-1.2 mg by mouth 2 (two) times daily as needed (for gout flares).   CoQ10 100 MG Caps Take 100 mg by mouth every evening.   diazepam 5 MG tablet Commonly known as:  VALIUM TAKE 1 TABLET BY MOUTH EVERY 6 HOURS AS NEEDED FOR LEG SPASMS What changed:  how much to take  how to take this  when to take this  reasons to take this  additional instructions   furosemide 40 MG tablet Commonly known as:  LASIX TAKE 1 TABLET (40 MG TOTAL) BY MOUTH DAILY.   gabapentin 600 MG tablet Commonly known as:  NEURONTIN Take 1 tablet (600 mg total) by mouth 3 (three) times daily.   ibuprofen 400 MG tablet Commonly known as:  ADVIL,MOTRIN Take 1 tablet (400 mg total) by mouth every 6 (six) hours as needed for headache or moderate pain.   insulin aspart 100 UNIT/ML FlexPen Commonly known as:   NOVOLOG Inject 5 Units into the skin 3 (three) times daily with meals as needed for high blood sugar (if CBG > 200). What changed:  how much to take  how to take this  when to take this  additional instructions   Insulin Glargine 100 UNIT/ML Solostar Pen Commonly known as:  LANTUS Inject 10 Units into the skin daily at 10 pm. What changed:  when to take this   Magnesium 250 MG Tabs Take 250 mg by mouth every evening.   methocarbamol 750 MG tablet Commonly known as:  ROBAXIN TAKE 2 TABLETS (1,500 MG TOTAL) BY MOUTH 3 (THREE) TIMES DAILY.   miconazole 2 % cream Commonly known as:  MICOTIN Apply topically 2 (two) times daily. What changed:  how much to take  when to take this  additional instructions   multivitamin with minerals Tabs tablet Take 1 tablet by mouth every evening.   nitrofurantoin 50 MG capsule Commonly known as:  MACRODANTIN Take 1 capsule (50 mg total) by mouth 2 (  two) times daily.   oxybutynin 5 MG 24 hr tablet Commonly known as:  DITROPAN-XL Take 1 tablet (5 mg total) by mouth at bedtime.   predniSONE 20 MG tablet Commonly known as:  DELTASONE Take 30 mg by mouth daily with breakfast.   traMADol 50 MG tablet Commonly known as:  ULTRAM TAKE 1 TABLET BY MOUTH 4 TIMES DAILY   vitamin B-12 1000 MCG tablet Commonly known as:  CYANOCOBALAMIN Take 1,000 mcg by mouth every evening.   Vitamin D3 1000 units Caps Take 4,000 Units by mouth every evening.       Discharge Instructions: Please refer to Patient Instructions section of EMR for full details.  Patient was counseled important signs and symptoms that should prompt return to medical care, changes in medications, dietary instructions, activity restrictions, and follow up appointments.   Follow-Up Appointments: Follow-up Information    Hensel, Jamal Collin, MD. Go on 11/07/2016.   Specialty:  Family Medicine Why:  Please talk with your doctor about a [name] follow up. You are currently  scheduled for 11/07/16 at 8:50am. Contact information: [place] [address] [place], [place], DO 10/29/2016, 12:25 PM PGY-1, [place]

## 2016-10-29 NOTE — Care Management Note (Signed)
Case Management Note  Patient Details  Name: Barbara [name] MRN: [id number] Date of Birth: 04-01-1956  Subjective/Objective:    Presented with chills and clogged indwelling Foley. PMH is significant for multiple sclerosis with spastic paraplegia, chronic prednisone use, indwelling chronic Foley, left lower extremity cellulitis, HTN, T2DM.               [name] (Sister) Jana Half (Sister)    [phone]      Action/Plan: Plan is to d/c to home with the resumption of home health services (RN,NA).  Active with [facility]. [facility], [phone], will provide transportation to home. Sister, Corrie Dandy, to received pt from [facility].  Expected Discharge Date:  10/29/16               Expected Discharge Plan:  Home w Home Health Services  In-House Referral:     Discharge planning Services  CM Consult  Post Acute Care Choice:  Resumption of Svcs/PTA Provider, Home Health Choice offered to:  Patient  DME Arranged:    DME Agency:     HH Arranged:  RN, Nurse's Aide HH Agency:  [facility], Referral made with Marcelino Duster @ [phone] for resumption of home health services.  Status of Service:  Completed, signed off  If discussed at Long Length of Stay Meetings, dates discussed:    Additional Comments:  Epifanio Lesches, RN 10/29/2016, 4:44 PM

## 2016-10-29 NOTE — Progress Notes (Signed)
Inpatient Diabetes Program Recommendations  AACE/ADA: New Consensus Statement on Inpatient Glycemic Control (2015)  Target Ranges:  Prepandial:   less than 140 mg/dL      Peak postprandial:   less than 180 mg/dL (1-2 hours)      Critically ill patients:  140 - 180 mg/dL   Results for AZEL, KELCH (MRN [id number]) as of 10/29/2016 11:15  Ref. Range 10/28/2016 08:02 10/28/2016 12:45 10/28/2016 17:44 10/28/2016 21:57  Glucose-Capillary Latest Ref Range: 65 - 99 mg/dL 680 (H) 881 (H) 103 (H) 224 (H)    Home DM Meds: Lantus 10 units QHS       Novolog 5 units TID  Current Insulin Orders: Lantus 15 units QHS      Novolog Moderate Correction Scale/ SSI (0-15 units) TID AC      Novolog 5 units TID with meals      CBGs in the afternoon likely elevated due to Prednisone 60 mg daily  MD- Please consider the following in-hospital insulin adjustments:  Increase Novolog Meal Coverage to: Novolog 8 units TID with meals (hold if pt eats <50% of meal)    --Will follow patient during hospitalization--  Ambrose Finland RN, MSN, CDE Diabetes Coordinator Inpatient Glycemic Control Team Team Pager: [phone] (8a-5p)

## 2016-10-30 LAB — CULTURE, BLOOD (ROUTINE X 2)
Culture: NO GROWTH
Culture: NO GROWTH
SPECIAL REQUESTS: ADEQUATE
Special Requests: ADEQUATE

## 2016-10-31 ENCOUNTER — Other Ambulatory Visit: Payer: Self-pay | Admitting: *Deleted

## 2016-10-31 DIAGNOSIS — G35 Multiple sclerosis: Secondary | ICD-10-CM | POA: Diagnosis not present

## 2016-10-31 DIAGNOSIS — I10 Essential (primary) hypertension: Secondary | ICD-10-CM | POA: Diagnosis not present

## 2016-10-31 DIAGNOSIS — M75101 Unspecified rotator cuff tear or rupture of right shoulder, not specified as traumatic: Secondary | ICD-10-CM | POA: Diagnosis not present

## 2016-10-31 DIAGNOSIS — N390 Urinary tract infection, site not specified: Secondary | ICD-10-CM | POA: Diagnosis not present

## 2016-10-31 DIAGNOSIS — L97222 Non-pressure chronic ulcer of left calf with fat layer exposed: Secondary | ICD-10-CM | POA: Diagnosis not present

## 2016-10-31 DIAGNOSIS — I872 Venous insufficiency (chronic) (peripheral): Secondary | ICD-10-CM | POA: Diagnosis not present

## 2016-10-31 DIAGNOSIS — Z466 Encounter for fitting and adjustment of urinary device: Secondary | ICD-10-CM | POA: Diagnosis not present

## 2016-10-31 DIAGNOSIS — M48061 Spinal stenosis, lumbar region without neurogenic claudication: Secondary | ICD-10-CM | POA: Diagnosis not present

## 2016-10-31 DIAGNOSIS — L97212 Non-pressure chronic ulcer of right calf with fat layer exposed: Secondary | ICD-10-CM | POA: Diagnosis not present

## 2016-10-31 DIAGNOSIS — E119 Type 2 diabetes mellitus without complications: Secondary | ICD-10-CM | POA: Diagnosis not present

## 2016-10-31 NOTE — Patient Outreach (Signed)
[facility]) Care Management  10/31/2016  Barbara [name] 06/12/1955 [id number]   Transition of care call #1  Placed call to patient as part of transition of care follow up, no answer able to leave a hipaa compliant telephone message requesting a return call.    Plan Will await return call if no response plan return call on  next business day .   Egbert Garibaldi, RN, [facility] Care Management [phone]- Mobile [phone]- Toll Free Main Office

## 2016-11-01 ENCOUNTER — Other Ambulatory Visit: Payer: Self-pay | Admitting: *Deleted

## 2016-11-01 NOTE — Patient Outreach (Addendum)
[facility]) Care Management  11/01/2016  Mirage Pfefferkorn July 05, 1955 [id number]   Transition of care call #2 Patient discharged home on 6/27 from [facility] health admission related to UTI.   Placed call to patient as part of transition of care , no answer able to leave a hipaa compliant  message on preferred phone number contact as well as mobile number.  Plan Will await return call if no response will schedule follow up call in the next business day.  [facility]) Care Management  [place] [facility] Care Manager  11/01/2016   Patrece Tallie 07-12-1955 [id number]   Patient returned call    Subjective: Brief conversation with patient .   Patient reports she is doing okay.  She reports that home health has visited her 2 times since discharge home. Patient reports no new concerns related to foley catheter or drainage no blood noted.   Patient reports she is taking all medications as prescribed.  Patient has scheduled PCP post hospital visit per discharge instructions, unsure if she will attend as previous plan to follow up as needed.  Patient denies any new concerns at this time. Patient agreeable to follow up call in the next week.   Encounter Medications:  Outpatient Encounter Prescriptions as of 11/01/2016  Medication Sig Note  . acidophilus (RISAQUAD) CAPS capsule Take 1 capsule by mouth every evening.   Marland Kitchen amoxicillin (AMOXIL) 500 MG capsule Take 1 capsule (500 mg total) by mouth every 12 (twelve) hours.   Marland Kitchen aspirin EC 325 MG tablet Take 325 mg by mouth every evening.    . Cholecalciferol (VITAMIN D3) 1000 units CAPS Take 4,000 Units by mouth every evening.    . ciprofloxacin (CIPRO) 500 MG tablet Take 1 tablet (500 mg total) by mouth 2 (two) times daily.   . Coenzyme Q10 (COQ10) 100 MG CAPS Take 100 mg by mouth every evening.   . colchicine 0.6 MG tablet Take 0.6-1.2 mg by mouth 2 (two) times daily as needed (for gout flares).    . diazepam (VALIUM) 5 MG  tablet TAKE 1 TABLET BY MOUTH EVERY 6 HOURS AS NEEDED FOR LEG SPASMS (Patient taking differently: Take 5 mg by mouth every 6 (six) hours as needed for muscle spasms. )   . furosemide (LASIX) 40 MG tablet TAKE 1 TABLET (40 MG TOTAL) BY MOUTH DAILY.   Marland Kitchen gabapentin (NEURONTIN) 600 MG tablet Take 1 tablet (600 mg total) by mouth 3 (three) times daily.   Marland Kitchen ibuprofen (ADVIL,MOTRIN) 400 MG tablet Take 1 tablet (400 mg total) by mouth every 6 (six) hours as needed for headache or moderate pain.   Marland Kitchen insulin aspart (NOVOLOG) 100 UNIT/ML FlexPen Inject 5 Units into the skin 3 (three) times daily with meals as needed for high blood sugar (if CBG > 200). (Patient taking differently: Inject 5 Units into the skin 3 (three) times daily with meals. Pt also uses as needed for BS greater than 200.)   . Insulin Glargine (LANTUS) 100 UNIT/ML Solostar Pen Inject 10 Units into the skin daily at 10 pm. (Patient taking differently: Inject 10 Units into the skin at bedtime. )   . Magnesium 250 MG TABS Take 250 mg by mouth every evening.    . methocarbamol (ROBAXIN) 750 MG tablet TAKE 2 TABLETS (1,500 MG TOTAL) BY MOUTH 3 (THREE) TIMES DAILY.   . miconazole (MICOTIN) 2 % cream Apply topically 2 (two) times daily. (Patient taking differently: Apply 1 application topically 2 (two) times a week. Pt  applies to legs on Mondays and Thursdays.)   . Multiple Vitamin (MULTIVITAMIN WITH MINERALS) TABS tablet Take 1 tablet by mouth every evening.   . nitrofurantoin (MACRODANTIN) 50 MG capsule Take 1 capsule (50 mg total) by mouth 2 (two) times daily. 10/23/2016: Currently has no end date.   Marland Kitchen oxybutynin (DITROPAN-XL) 5 MG 24 hr tablet Take 1 tablet (5 mg total) by mouth at bedtime.   . predniSONE (DELTASONE) 20 MG tablet Take 30 mg by mouth daily with breakfast.   . traMADol (ULTRAM) 50 MG tablet TAKE 1 TABLET BY MOUTH 4 TIMES DAILY   . vitamin B-12 (CYANOCOBALAMIN) 1000 MCG tablet Take 1,000 mcg by mouth every evening.    No  facility-administered encounter medications on file as of 11/01/2016.   Patient was recently discharged from hospital and all medications have been reviewed.  Functional Status:  In your present state of health, do you have any difficulty performing the following activities: 11/01/2016 10/01/2016  Hearing? N N  Vision? N N  Difficulty concentrating or making decisions? N N  Walking or climbing stairs? Y Y  Dressing or bathing? Y Y  Doing errands, shopping? Malvin Johns  Preparing Food and eating ? N Y  Using the Toilet? Y Y  In the past six months, have you accidently leaked urine? Y Y  Do you have problems with loss of bowel control? Y Y  Managing your Medications? N N  Managing your Finances? N N  Housekeeping or managing your Housekeeping? Y Y  Some recent data might be hidden    Fall/Depression Screening: Fall Risk  11/01/2016 10/03/2016 07/23/2016  Falls in the past year? Yes Yes Yes  Number falls in past yr: 1 1 1   Injury with Fall? Yes Yes Yes  Risk Factor Category  High Fall Risk High Fall Risk High Fall Risk  Risk for fall due to : History of fall(s);Other (Comment) Impaired balance/gait;Impaired mobility Impaired balance/gait;Impaired mobility;History of fall(s)  Risk for fall due to (comments): spastic paraplegic  - -  Follow up Falls prevention discussed Falls prevention discussed -   PHQ 2/9 Scores 10/03/2016 08/06/2016 05/14/2016 03/06/2016 02/09/2015 04/20/2014 11/08/2013  PHQ - 2 Score 0 0 0 1 0 0 0     Plan:  Will continue weekly transition of care call, next call in a week.   Egbert Garibaldi, RN, [facility] Care Management [phone]- Mobile [phone]- Toll Free Main Office

## 2016-11-04 DIAGNOSIS — Z9981 Dependence on supplemental oxygen: Secondary | ICD-10-CM | POA: Diagnosis not present

## 2016-11-04 DIAGNOSIS — L97212 Non-pressure chronic ulcer of right calf with fat layer exposed: Secondary | ICD-10-CM | POA: Diagnosis not present

## 2016-11-04 DIAGNOSIS — I10 Essential (primary) hypertension: Secondary | ICD-10-CM | POA: Diagnosis not present

## 2016-11-04 DIAGNOSIS — E119 Type 2 diabetes mellitus without complications: Secondary | ICD-10-CM | POA: Diagnosis not present

## 2016-11-04 DIAGNOSIS — M4802 Spinal stenosis, cervical region: Secondary | ICD-10-CM | POA: Diagnosis not present

## 2016-11-04 DIAGNOSIS — N390 Urinary tract infection, site not specified: Secondary | ICD-10-CM | POA: Diagnosis not present

## 2016-11-04 DIAGNOSIS — M75101 Unspecified rotator cuff tear or rupture of right shoulder, not specified as traumatic: Secondary | ICD-10-CM | POA: Diagnosis not present

## 2016-11-04 DIAGNOSIS — G35 Multiple sclerosis: Secondary | ICD-10-CM | POA: Diagnosis not present

## 2016-11-04 DIAGNOSIS — Z7982 Long term (current) use of aspirin: Secondary | ICD-10-CM | POA: Diagnosis not present

## 2016-11-04 DIAGNOSIS — Z466 Encounter for fitting and adjustment of urinary device: Secondary | ICD-10-CM | POA: Diagnosis not present

## 2016-11-04 DIAGNOSIS — L97222 Non-pressure chronic ulcer of left calf with fat layer exposed: Secondary | ICD-10-CM | POA: Diagnosis not present

## 2016-11-04 DIAGNOSIS — M48061 Spinal stenosis, lumbar region without neurogenic claudication: Secondary | ICD-10-CM | POA: Diagnosis not present

## 2016-11-04 DIAGNOSIS — I872 Venous insufficiency (chronic) (peripheral): Secondary | ICD-10-CM | POA: Diagnosis not present

## 2016-11-04 DIAGNOSIS — Z794 Long term (current) use of insulin: Secondary | ICD-10-CM | POA: Diagnosis not present

## 2016-11-07 ENCOUNTER — Other Ambulatory Visit: Payer: Self-pay | Admitting: *Deleted

## 2016-11-07 ENCOUNTER — Telehealth: Payer: Self-pay | Admitting: Family Medicine

## 2016-11-07 ENCOUNTER — Inpatient Hospital Stay: Payer: PPO | Admitting: Family Medicine

## 2016-11-07 MED FILL — NOVOLOG FLEXPEN SYRINGE: 100 | 90 days supply | Qty: 15 | Fill #0

## 2016-11-07 MED FILL — GABAPENTIN 600 MG TABLET: 600 | 60 days supply | Qty: 180 | Fill #0

## 2016-11-07 NOTE — Patient Outreach (Signed)
[facility]) Care Management  11/07/2016  Malery Pellerito [birth year] [id number]   Patient case has been reviewed in a  multidisciplinary case discussion on today.  Plan follow up transition of care call  on next business day.  Care coordination call to [facility] practice center to follow up  Regarding if center has program for providers to visit patient in home,receptionist transferred call,  able to leave a message with contact information on voice mail requesting return call .    Egbert Garibaldi, RN, [facility] Care Management [phone]- Mobile [phone]- Toll Free Main Office

## 2016-11-07 NOTE — Telephone Encounter (Signed)
Spoke with Gavin Pound.  They are already doing una boots.  Leg is weeping badly Left>right.  Will increase lasix to 40 bid x 5 days.  Gavin Pound told me what I already know.  Patient does not have 24 hour supervision.  She was in her wheelchair overnight with legs not elevated.  She was incontinent of stool in the chair.  She said that if the patient is rehospitalized, [facility] would not take her back because she exceeds the level of care they can provide.

## 2016-11-07 NOTE — Telephone Encounter (Signed)
[facility] home [facility]: at home visit yesterday, the lower left extremeity has saturated the pillow. She had been in the chair all night. Her feet were strapped down to the lift chair. She has open areas on both feet.  Please advise.  She may need more lasix and uniboot.

## 2016-11-08 ENCOUNTER — Other Ambulatory Visit: Payer: Self-pay | Admitting: *Deleted

## 2016-11-08 ENCOUNTER — Telehealth: Payer: Self-pay | Admitting: Family Medicine

## 2016-11-08 NOTE — Telephone Encounter (Signed)
Spoke with [name].  Like my partners at home health, [name] has grave concerns with current situation.  I will meet Barbara [name] to do home visit on [facility] Monday.  My message will be: 1. Current living situation is not a safe, viable option. 2. She has the choice.  A. Continue typical medical treatment and accept SNF placement  B Continue to live at home and embrace comfort care as the natural extension of that decision.

## 2016-11-08 NOTE — Patient Outreach (Signed)
[facility]) Care Management  11/08/2016  [place] Athanas 1955-11-29 [id number]  Transition of care/care coordination.  Placed call to [facility] regarding patient home visits, Spoke with Gavin Pound she discussed concern regarding patient increase in swelling and  leaking at leg areas, states it does not look infected, MD was notified and new orders for lasix.  She again discussed concern regarding patient safety of staying at home alone. Patient is followed by home health RN 3 days a week and bath aide 2 days a week, patient sister   Placed call to Dr.Hensel office to discuss patient concerns and follow up plan for office visits. Spoke with Dr.Hensel regarding concerns for patient safety of not having 24 hour care, and canceling follow up hospital visits, preferring to communicate by  e-mail , phone and if needed  emergency room visits as her plan due to cost of nonemergency transportation. .Discussed options of transportation service of [facility] that is  wheelchair/scooter assess ible  that has been offered to patient as an option of getting to visits other than cost of  ambulance  and patient concern of needing assistance from family/ home health  to get in and out of wheelchair by hoyer lift.  Dr.Hensel is agreeable to visit patient at her home.   Transition of care call to patient  Placed call to patient, reports she is doing fine, discussed she is tolerating her usual diet, blood sugar 215 this morning it was checked shortly after eating a banana . Patient reports she believes her legs are better today , she is expecting home health visit later today for follow up. Patient states she empties her own foley daily and   Patient discussed she cancelled upcoming follow up post hospital visit with PCP , reports she did not have transportation, discussed cost of $200 for ambulance ride. Discussed with patient importance of MD follow up after hospital discharge, discussed  Dr.Hensel is agreeable to visit her at home if she is agreeable, she states that would be fine, she is also agreeable that I can attend at visit also.    Plan Will plan visit at patient home, with Dr.Hensel on Monday, July 9 at 3pm.  Will send Dr.Hensel in basket message that patient is agreeable to visit.   Egbert Garibaldi, RN, [facility] Care Management [phone]- Mobile [phone]- Toll Free Main Office

## 2016-11-08 NOTE — Telephone Encounter (Signed)
Kim who is the case Production designer, theatre/television/film at [facility]. She work with the patient and would like to speak to [name] about some concerns she is having. Please call her at [phone]. jw

## 2016-11-11 ENCOUNTER — Other Ambulatory Visit: Payer: Self-pay | Admitting: *Deleted

## 2016-11-11 ENCOUNTER — Other Ambulatory Visit: Payer: Self-pay | Admitting: Family Medicine

## 2016-11-11 ENCOUNTER — Encounter: Payer: Self-pay | Admitting: *Deleted

## 2016-11-11 DIAGNOSIS — Z515 Encounter for palliative care: Secondary | ICD-10-CM

## 2016-11-11 DIAGNOSIS — I872 Venous insufficiency (chronic) (peripheral): Secondary | ICD-10-CM

## 2016-11-11 DIAGNOSIS — G35 Multiple sclerosis: Secondary | ICD-10-CM

## 2016-11-11 MED FILL — ONE TOUCH ULTRA TEST STRIPS: 50 days supply | Qty: 100 | Fill #2

## 2016-11-11 NOTE — Patient Outreach (Addendum)
[facility]) Care Management   11/11/2016  Barbara [name] [birth year] [id number]  Barbara [name] is an 61 y.o. female  Subjective:   Patient discussed current support that she has coming to assist in the home during the day.  Patient has supportive family, one sister visits at least 3 days a week and assist as needed  Neighbor checks on patient daily feeds her bird, dog, cat.  Patient has someone  to come 3 days a week for a few hours during the evening to help with few housekeeping chores and is able to assist with movement from chair to bed with hoyer lift. Patient discussed her goal of being able to stay at home as long as possible . She states she has started a list of pro and cons of being cared in nursing home vs her home. Patient discussed her daily pattern  at times she sleeps in recliner chair at night depending on time of day she gets up, on the mornings  that bath aid comes she usually in the bed on arrival and then gets hoyer lift to recliner chair. Patient discussed at times she has called her sister to come and assist her at night to change position.                  Objective: BP 140/80 (BP Location: Right Arm, Patient Position: Sitting, Cuff Size: Normal)   Pulse 90   Resp 18   LMP 10/07/2010   SpO2 92%   Patient sitting up in recliner chair, keeps soft light rope around legs while in chair.  Review of Systems  Constitutional: Negative.   HENT: Negative.   Eyes: Negative.   Respiratory: Negative.   Cardiovascular: Positive for leg swelling.  Gastrointestinal: Negative.   Genitourinary:       Foley   Skin: Negative.   Neurological:       Paraplegic   Endo/Heme/Allergies: Negative.   Psychiatric/Behavioral: Negative.     Physical Exam  Constitutional: She is oriented to person, place, and time. She appears well-developed and well-nourished.  Cardiovascular: Normal rate, normal heart sounds and intact distal pulses.   Respiratory: Effort normal  and breath sounds normal.  GI: Soft. Bowel sounds are normal.  Neurological: She is alert and oriented to person, place, and time.  Skin: Skin is warm and dry.     Psychiatric: She has a normal mood and affect. Her behavior is normal. Judgment and thought content normal.    Encounter Medications:   Outpatient Encounter Prescriptions as of 11/11/2016  Medication Sig Note  . acidophilus (RISAQUAD) CAPS capsule Take 1 capsule by mouth every evening.   Marland Kitchen aspirin EC 325 MG tablet Take 325 mg by mouth every evening.    . Cholecalciferol (VITAMIN D3) 1000 units CAPS Take 4,000 Units by mouth every evening.    . Coenzyme Q10 (COQ10) 100 MG CAPS Take 100 mg by mouth every evening.   . colchicine 0.6 MG tablet Take 0.6-1.2 mg by mouth 2 (two) times daily as needed (for gout flares).    . diazepam (VALIUM) 5 MG tablet TAKE 1 TABLET BY MOUTH EVERY 6 HOURS AS NEEDED FOR LEG SPASMS (Patient taking differently: Take 5 mg by mouth every 6 (six) hours as needed for muscle spasms. )   . furosemide (LASIX) 40 MG tablet TAKE 1 TABLET (40 MG TOTAL) BY MOUTH DAILY.   Marland Kitchen gabapentin (NEURONTIN) 600 MG tablet Take 1 tablet (600 mg total) by mouth 3 (three) times daily.   Marland Kitchen  ibuprofen (ADVIL,MOTRIN) 400 MG tablet Take 1 tablet (400 mg total) by mouth every 6 (six) hours as needed for headache or moderate pain.   Marland Kitchen insulin aspart (NOVOLOG) 100 UNIT/ML FlexPen Inject 5 Units into the skin 3 (three) times daily with meals as needed for high blood sugar (if CBG > 200). (Patient taking differently: Inject 5 Units into the skin 3 (three) times daily with meals. Pt also uses as needed for BS greater than 200.)   . Insulin Glargine (LANTUS) 100 UNIT/ML Solostar Pen Inject 10 Units into the skin daily at 10 pm. (Patient taking differently: Inject 10 Units into the skin at bedtime. )   . Magnesium 250 MG TABS Take 250 mg by mouth every evening.    . methocarbamol (ROBAXIN) 750 MG tablet TAKE 2 TABLETS (1,500 MG TOTAL) BY MOUTH 3  (THREE) TIMES DAILY.   . Multiple Vitamin (MULTIVITAMIN WITH MINERALS) TABS tablet Take 1 tablet by mouth every evening.   . nitrofurantoin (MACRODANTIN) 50 MG capsule Take 1 capsule (50 mg total) by mouth 2 (two) times daily. 10/23/2016: Currently has no end date.   Marland Kitchen oxybutynin (DITROPAN-XL) 5 MG 24 hr tablet Take 1 tablet (5 mg total) by mouth at bedtime.   . predniSONE (DELTASONE) 20 MG tablet Take 30 mg by mouth daily with breakfast.   . traMADol (ULTRAM) 50 MG tablet TAKE 1 TABLET BY MOUTH 4 TIMES DAILY   . vitamin B-12 (CYANOCOBALAMIN) 1000 MCG tablet Take 1,000 mcg by mouth every evening.   . miconazole (MICOTIN) 2 % cream Apply topically 2 (two) times daily. (Patient taking differently: Apply 1 application topically 2 (two) times a week. Pt applies to legs on Mondays and Thursdays.)    No facility-administered encounter medications on file as of 11/11/2016.   Patient was recently discharged from hospital and all medications have been reviewed.  Functional Status:   In your present state of health, do you have any difficulty performing the following activities: 11/01/2016 10/01/2016  Hearing? N N  Vision? N N  Difficulty concentrating or making decisions? N N  Walking or climbing stairs? Y Y  Dressing or bathing? Y Y  Doing errands, shopping? Malvin Johns  Preparing Food and eating ? N Y  Using the Toilet? Y Y  In the past six months, have you accidently leaked urine? Y Y  Do you have problems with loss of bowel control? Y Y  Managing your Medications? N N  Managing your Finances? N N  Housekeeping or managing your Housekeeping? Y Y  Some recent data might be hidden    Fall/Depression Screening:    Fall Risk  11/01/2016 10/03/2016 07/23/2016  Falls in the past year? Yes Yes Yes  Number falls in past yr: 1 1 1   Injury with Fall? 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   Recent UTI  Urine in foley catheter clear , keeping foley bag hanging below bladder. Taking medications as prescribed. Home  health RN visit 3 days a week , with foley catheter flush and Unna booth  dressing change. Need reinforcement on early MD notification of symptoms .   Social/ safety concerns MD discussion with patient on options safe plan for care Safest medical plan for care is nursing facility, for 24 hour support related to medical problems. Discussed limitations in home health agencies available to meet her needs . Patient goal is to remain in her home at this time, and agreeable to options of increasing support by : increase support in home by hiring someone to assist her for longer periods of time, exploring getting medical alert system.  Patient has understanding that as medical  condition changes she will make decision she "will know when it is time for comfort measures plan  Patient has concerns related to finances cost of hiring someone in home, and future nursing home cost concerns.     Lower leg swelling and weeping MD changes to increase lasix, patient agreeable to work on keeping legs elevated more while sitting in recliner chair.   Diabetes  Checking blood sugars, and report taking insulin did not check today, out of strips sister to pick up prescription on today. Patient denies any episodes of low blood sugar symptoms. Patient has changed to limited sugar juices.  Plan:  Patient will remain active with transition of care weekly outreaches , next call in a  week. Patient will notify MD/RN of new symptoms/signs of UTI i Will place social worker consult to assist with resources for hiring increased help in home.  Pl[facility] call to [facility] regarding personal care assistance, spoke with Aurther Loft and she plans to contact patient by telephone to set up meeting with her.  Will send PCP visit note   [facility] CM Care Plan Problem One     Most Recent Value  Care Plan Problem One  Recent hospital admission related UTI  Role Documenting the Problem One  Care Management Coordinator  Care Plan for Problem One  Active  [facility] Long Term Goal   Patient will not experience a hospital admission in the next 31 days   [facility] Long Term Goal Start Date  11/01/16  Interventions for Problem One Long Term Goal  Discussed continuing to follow MD instructions for medications, notifying MD/HH/CM of new symptoms to address symptoms sooner   [facility] CM Short Term Goal #1   Patient will be able to report symptoms of worsening signs of infection to report to PCP in the next 30 days   [facility] CM Short Term Goal #1 Start Date  11/01/16  Interventions for Short Term Goal #1  Provided and reviewed EMMI handout on managing foley catheter, signs of concern to notify MD of   [facility] CM Short Term Goal #2   Patient will be able to report visit with PCP in the next 14 days   [facility] CM Short Term Goal #2 Start Date  11/08/16  [facility] CM Short Term Goal #3  Patient will report making contact with her telephone company regarding  medical alert system in the next 30 days   [facility] CM Short Term Goal #3 Start Date  11/11/16  Interventions for Short Tern Goal #3  Advised regarding the benefits of having medical alert system in home for safety   [facility] CM Short Term Goal #4  Patient will report working toward elevating legs at least every 2 hours for at least 30 minutes while sitting in chair in the next 30 days  [facility] CM Short Term Goal #4 Start Date  11/11/16  Interventions for Short Term Goal #4  Instructed  regarding the benefits of elevating legs as much as possible while sitting in the chair.       Egbert Garibaldi, RN, [facility] Care Management Coordinator  [facility] Care Management [phone]- Mobile [phone]- Toll Free Main Office

## 2016-11-12 ENCOUNTER — Encounter: Payer: Self-pay | Admitting: *Deleted

## 2016-11-12 MED ORDER — FUROSEMIDE 40 MG PO TABS: 40.0000 mg | ORAL_TABLET | Freq: Two times a day (BID) | ORAL | 3 refills | Status: DC

## 2016-11-12 MED ORDER — HYDROCHLOROTHIAZIDE 25 MG PO TABS: 25.0000 mg | ORAL_TABLET | Freq: Every day | ORAL | 3 refills | Status: DC

## 2016-11-12 NOTE — Progress Notes (Signed)
   Subjective:    Patient ID: Barbara [name], female    DOB: 14-Dec-1955, 61 y.o.   MRN: [id number]  HPI Patient unable to come to office except via ambulance.  Recent hospital discharge for leg edema and cellulitis.  She has been getting [facility] care from [facility].  Nurses are concerned about her leg edema, weeping legs and home situation.  She cannot do transfers.  She has lots of support but does not have 24 hour support.  She often sleeps in the care.  She has a foley.  She is continent of stool but must wait until someone arrives to help.  She cannot transfer to a bedside commode. No fever or systemic symptoms of infection. Denies abd pain or cva pain  I had recently increased lasix to 80 mg tid now back to bid.   She elevates her legs but suboptimally.  Most of visit was spent on goals of care.  Greater than 50% of visit was counseling.    Review of Systems     Objective:   Physical Exam  Abd benign No CVA tenderness Legs wrapped bilaterally in unaboots which are dry.  No erythema above bandages.  3+ edema.  Duration of visit was 1 hour.        Assessment & Plan:

## 2016-11-12 NOTE — Assessment & Plan Note (Addendum)
Severe predisposing patient to cellulitis.  Will push hard on diuresis at the risk of kidney injury in an attempt to keep her at home.

## 2016-11-12 NOTE — Assessment & Plan Note (Signed)
She is further down the road on her journey to comfort care.  She strongly wants to remain at home.  (She has pet parrot, dog and cat.)  She recognizes the risk of staying at home and is at peace with death "Anytime God wants to take me is fine."    She also recognizes that at some point staying at home will not be an option.    We decided on: 1. She will try to hire more support at home. 2. I will more aggressively diurese - recognizing the risk of kidney injury.  With my approval, she makes this choice because recurrent leg cellulitis is the most likely thing to bring her to the hospital. 3. She recognizes that the next time she comes to the hospital, she is likely to be DCed to SNF.  Therefore, she strongly wants to avoid the ER and hospital and is willing to take risks to avoid.  Also see care coordination note.

## 2016-11-12 NOTE — Assessment & Plan Note (Signed)
Has left her non mobile.

## 2016-11-13 ENCOUNTER — Encounter: Payer: Self-pay | Admitting: Family Medicine

## 2016-11-13 ENCOUNTER — Telehealth: Payer: Self-pay | Admitting: *Deleted

## 2016-11-13 DIAGNOSIS — L03119 Cellulitis of unspecified part of limb: Secondary | ICD-10-CM

## 2016-11-13 MED ORDER — DOXYCYCLINE HYCLATE 100 MG PO TABS: 100.0000 mg | ORAL_TABLET | Freq: Two times a day (BID) | ORAL | 0 refills | Status: DC

## 2016-11-13 NOTE — Progress Notes (Signed)
Phone call.  Now running a fever per nurse and legs more red.  Will Rx as cellulitis..  Of course, the fever could also be from a urinary source.

## 2016-11-13 NOTE — Telephone Encounter (Signed)
See my separate documentation note.  Doxy Rx sent in.

## 2016-11-13 NOTE — Telephone Encounter (Signed)
Tiffany form [facility] called to say that [name] came out to pt house on Monday and increased pt fluid pill and she stated that today pt is running a fever of 101 and wanted to know if [name] would be willing to call in an antibiotic for the pt and he said he would send it to her pharmacy.  Routing to PCP as an FYI that this has been documented. Contacted Tiffany and informed her that he would send in something. [name], Marqueze Ramcharan D, [place]

## 2016-11-14 ENCOUNTER — Other Ambulatory Visit: Payer: Self-pay | Admitting: *Deleted

## 2016-11-14 MED FILL — DOXYCYCLINE HYCLATE 100 MG: 100 | 10 days supply | Qty: 20 | Fill #0

## 2016-11-14 NOTE — Patient Outreach (Signed)
[facility]) Care Management  11/14/2016  Barbara [name] 17-Aug-1955 [id number]   Patient case  was reviewed in a  multidisciplinary case discussion .   Plan  Will continue transition of care follow up, address  patient centered goals .   Egbert Garibaldi, RN, [facility] Care Management [phone]- Mobile [phone]- Toll Free Main Office

## 2016-11-15 ENCOUNTER — Other Ambulatory Visit: Payer: Self-pay | Admitting: Pharmacist

## 2016-11-15 ENCOUNTER — Other Ambulatory Visit: Payer: Self-pay | Admitting: *Deleted

## 2016-11-15 NOTE — Patient Outreach (Signed)
Call to follow up with Barbara [name] about her current out of pocket expense for the year for her patient assistance application. Called and spoke with patient. HIPAA identifiers verified and verbal consent received.  [name] reports that she has not yet reached the $1000 out of pocket expense requirement. Reports that she has spent about $800 out of pocket now for the calendar year. [name] asks that I call to follow up with her again next month.  Will call to follow up again next month.  [name], PharmD, [facility] Clinical Pharmacist [facility] Care Management [phone]

## 2016-11-15 NOTE — Patient Outreach (Signed)
[facility]) Care Management  11/14/2016  Barbara [name] Dec 19, 1955 [id number]   CSW received referral to assist patient with caregiver support in the home. CSW contacted patient by phone, confirmed identity and explained reason for referral/contact. CSW is familiar with patient from previous SNF stay. Patient is hoping to hire some additional support at home to aide in her comfort and ability to remain home and, "stay out of a nursing home". CSW will plan follow up call next week with updates on potential caregiver support options for her to review and determine choice as a private pay venture.   Patient was positive and appreciative of call and support.   F/U call 11/18/16.  Reece Levy, MSW, LCSW Clinical Social Worker  [facility] [phone]

## 2016-11-18 ENCOUNTER — Other Ambulatory Visit: Payer: Self-pay | Admitting: *Deleted

## 2016-11-19 ENCOUNTER — Other Ambulatory Visit: Payer: Self-pay | Admitting: *Deleted

## 2016-11-19 NOTE — Patient Outreach (Signed)
[facility]) Care Management  [facility] Social Work  11/19/2016  Barbara [name] 1955/05/16 [id number]  Subjective:  "I need some additional help at home to care for my needs". Objective: CSW to assist patient with commuity based resources to aide in her well-being, quality of life and overall safety/needs.   Encounter Medications:  Outpatient Encounter Prescriptions as of 11/18/2016  Medication Sig Note  . acidophilus (RISAQUAD) CAPS capsule Take 1 capsule by mouth every evening.   Marland Kitchen aspirin EC 325 MG tablet Take 325 mg by mouth every evening.    . Cholecalciferol (VITAMIN D3) 1000 units CAPS Take 4,000 Units by mouth every evening.    . Coenzyme Q10 (COQ10) 100 MG CAPS Take 100 mg by mouth every evening.   . colchicine 0.6 MG tablet Take 0.6-1.2 mg by mouth 2 (two) times daily as needed (for gout flares).    . diazepam (VALIUM) 5 MG tablet TAKE 1 TABLET BY MOUTH EVERY 6 HOURS AS NEEDED FOR LEG SPASMS (Patient taking differently: Take 5 mg by mouth every 6 (six) hours as needed for muscle spasms. )   . doxycycline (VIBRA-TABS) 100 MG tablet Take 1 tablet (100 mg total) by mouth 2 (two) times daily.   . furosemide (LASIX) 40 MG tablet Take 1 tablet (40 mg total) by mouth 2 (two) times daily.   Marland Kitchen gabapentin (NEURONTIN) 600 MG tablet Take 1 tablet (600 mg total) by mouth 3 (three) times daily.   . hydrochlorothiazide (HYDRODIURIL) 25 MG tablet Take 1 tablet (25 mg total) by mouth daily.   Marland Kitchen ibuprofen (ADVIL,MOTRIN) 400 MG tablet Take 1 tablet (400 mg total) by mouth every 6 (six) hours as needed for headache or moderate pain.   Marland Kitchen insulin aspart (NOVOLOG) 100 UNIT/ML FlexPen Inject 5 Units into the skin 3 (three) times daily with meals as needed for high blood sugar (if CBG > 200). (Patient taking differently: Inject 5 Units into the skin 3 (three) times daily with meals. Pt also uses as needed for BS greater than 200.)   . Insulin Glargine (LANTUS) 100 UNIT/ML Solostar Pen Inject  10 Units into the skin daily at 10 pm. (Patient taking differently: Inject 10 Units into the skin at bedtime. )   . Magnesium 250 MG TABS Take 250 mg by mouth every evening.    . methocarbamol (ROBAXIN) 750 MG tablet TAKE 2 TABLETS (1,500 MG TOTAL) BY MOUTH 3 (THREE) TIMES DAILY.   . miconazole (MICOTIN) 2 % cream Apply topically 2 (two) times daily. (Patient taking differently: Apply 1 application topically 2 (two) times a week. Pt applies to legs on Mondays and Thursdays.)   . Multiple Vitamin (MULTIVITAMIN WITH MINERALS) TABS tablet Take 1 tablet by mouth every evening.   . nitrofurantoin (MACRODANTIN) 50 MG capsule Take 1 capsule (50 mg total) by mouth 2 (two) times daily. 10/23/2016: Currently has no end date.   Marland Kitchen oxybutynin (DITROPAN-XL) 5 MG 24 hr tablet Take 1 tablet (5 mg total) by mouth at bedtime.   . predniSONE (DELTASONE) 20 MG tablet Take 30 mg by mouth daily with breakfast.   . traMADol (ULTRAM) 50 MG tablet TAKE 1 TABLET BY MOUTH 4 TIMES DAILY   . vitamin B-12 (CYANOCOBALAMIN) 1000 MCG tablet Take 1,000 mcg by mouth every evening.    No facility-administered encounter medications on file as of 11/18/2016.     Functional Status:  In your present state of health, do you have any difficulty performing the following activities: 11/01/2016 10/01/2016  Hearing? N N  Vision? N N  Difficulty concentrating or making decisions? N N  Walking or climbing stairs? Y Y  Dressing or bathing? Y Y  Doing errands, shopping? Malvin Johns  Preparing Food and eating ? N Y  Using the Toilet? Y Y  In the past six months, have you accidently leaked urine? Y Y  Do you have problems with loss of bowel control? Y Y  Managing your Medications? N N  Managing your Finances? N N  Housekeeping or managing your Housekeeping? Malvin Johns  Some recent data might be hidden    Fall/Depression Screening:  PHQ 2/9 Scores 10/03/2016 08/06/2016 05/14/2016 03/06/2016 02/09/2015 04/20/2014 11/08/2013  PHQ - 2 Score 0 0 0 1 0 0 0     Assessment: CSW spoke with patient by phone today and discussed her need/request for hiring some additional help in the home to support her physical needs at nighttime. Patient reports that with the progression of her MS she is less able to maneuver shifting/rolling over in the bed during the night- "I end up in so much pain because I cannot roll over- just like you may change positions during the night, I can't". Patient has some family, friends and caregiver assistance during the day and has very limited opportunities to go out of the home; using EMS to get to appointments; costing her $200 each way.   Patient with obvious need of more support and services; does not want to move given she has a mortgage and prefers to stay in her community.   Plan: CSW will investigate possible resources and support options and follow up with patient later this week.       Reece Levy, MSW, LCSW Clinical Social Worker  [facility] [employer] [phone]

## 2016-11-20 ENCOUNTER — Other Ambulatory Visit: Payer: Self-pay

## 2016-11-20 NOTE — Patient Outreach (Signed)
Transition of care: Placed call to home number---no answer Placed call to cell phone. Left a message requesting a call back.  PLAN: Will await a call back. Will update assigned case manager.  Rowe Pavy, RN, BSN, CEN [facility] [employer] [phone]

## 2016-11-21 ENCOUNTER — Other Ambulatory Visit: Payer: Self-pay

## 2016-11-21 ENCOUNTER — Ambulatory Visit: Payer: Self-pay | Admitting: *Deleted

## 2016-11-21 ENCOUNTER — Other Ambulatory Visit: Payer: Self-pay | Admitting: *Deleted

## 2016-11-21 MED FILL — NITROFURANTOIN MCR 50 MG CA: 50 | 30 days supply | Qty: 60 | Fill #1

## 2016-11-21 NOTE — Patient Outreach (Signed)
Transition of care: Placed call to patient for transition of care. Patient reports that she is doing "fine". States that she is did not monitor her CBG today. Reports home health nurse changed her catheter on 11/20/2016.  Reports unna boots doing well. Denies any new concerns today.  PLAN: patient will continue to be outreached weekly. Will provide update to assigned case manager.  Rowe Pavy, RN, BSN, CEN [facility] [employer] [phone]

## 2016-11-25 ENCOUNTER — Telehealth: Payer: Self-pay | Admitting: Family Medicine

## 2016-11-25 ENCOUNTER — Other Ambulatory Visit: Payer: Self-pay | Admitting: *Deleted

## 2016-11-25 NOTE — Telephone Encounter (Signed)
Cathy from [facility] called to follow up on the patient requesting hospice. Please call Lynden Ang back at [phone] and ask

## 2016-11-25 NOTE — Patient Outreach (Signed)
[facility]) Care Management  11/25/2016  Barbara [name] 02-17-1956 [id number]   Transition of care call  Placed call to home number no answer, placed call on cell number, able to leave a message requesting a return call back.  Plan Will await return call , if no response will attempt contact again within the week.   Egbert Garibaldi, RN, [facility] Care Management [phone]- Mobile [phone]- Toll Free Main Office

## 2016-11-25 NOTE — Telephone Encounter (Signed)
Discussed that not yet on hospice.  She still wants active treatments.  As such, she likely has a prognosis of > 6 months.

## 2016-11-25 NOTE — Telephone Encounter (Signed)
Will forward to MD to advise on if this was ordered for the patient.  Millee Denise,CMA

## 2016-11-25 NOTE — Patient Outreach (Signed)
[facility]) Care Management  11/25/2016  Barbara [name] March 19, 1956 [id number]   CSW spoke with patient again about potential support services in the home. She is hopeful that a new caregiver may be sufficient.  CSW will check in again later this week for updates.   Reece Levy, MSW, LCSW Clinical Social Worker  [facility] [employer] [phone]

## 2016-11-27 ENCOUNTER — Telehealth: Payer: Self-pay | Admitting: Internal Medicine

## 2016-11-27 NOTE — Telephone Encounter (Signed)
Called patient regarding home visit with geriatric team. Reports she is interested. She is okay with 2PM tomorrow 7/26

## 2016-11-28 ENCOUNTER — Other Ambulatory Visit: Payer: Self-pay | Admitting: Internal Medicine

## 2016-11-28 ENCOUNTER — Other Ambulatory Visit: Payer: Self-pay | Admitting: *Deleted

## 2016-11-28 ENCOUNTER — Non-Acute Institutional Stay (INDEPENDENT_AMBULATORY_CARE_PROVIDER_SITE_OTHER): Payer: PPO | Admitting: Internal Medicine

## 2016-11-28 DIAGNOSIS — E1165 Type 2 diabetes mellitus with hyperglycemia: Secondary | ICD-10-CM

## 2016-11-28 DIAGNOSIS — IMO0002 Reserved for concepts with insufficient information to code with codable children: Secondary | ICD-10-CM

## 2016-11-28 DIAGNOSIS — R531 Weakness: Secondary | ICD-10-CM | POA: Diagnosis not present

## 2016-11-28 DIAGNOSIS — R252 Cramp and spasm: Secondary | ICD-10-CM

## 2016-11-28 DIAGNOSIS — Z0389 Encounter for observation for other suspected diseases and conditions ruled out: Secondary | ICD-10-CM

## 2016-11-28 DIAGNOSIS — F489 Nonpsychotic mental disorder, unspecified: Secondary | ICD-10-CM

## 2016-11-28 DIAGNOSIS — E119 Type 2 diabetes mellitus without complications: Secondary | ICD-10-CM | POA: Diagnosis not present

## 2016-11-28 DIAGNOSIS — G35 Multiple sclerosis: Secondary | ICD-10-CM | POA: Diagnosis not present

## 2016-11-28 DIAGNOSIS — Z978 Presence of other specified devices: Secondary | ICD-10-CM

## 2016-11-28 DIAGNOSIS — M4802 Spinal stenosis, cervical region: Secondary | ICD-10-CM | POA: Diagnosis not present

## 2016-11-28 DIAGNOSIS — Z7952 Long term (current) use of systemic steroids: Secondary | ICD-10-CM

## 2016-11-28 DIAGNOSIS — Z794 Long term (current) use of insulin: Secondary | ICD-10-CM

## 2016-11-28 DIAGNOSIS — E118 Type 2 diabetes mellitus with unspecified complications: Secondary | ICD-10-CM

## 2016-11-28 DIAGNOSIS — Z960 Presence of urogenital implants: Secondary | ICD-10-CM

## 2016-11-28 NOTE — Patient Outreach (Signed)
[facility]) Care Management  11/28/2016  Barbara [name] 12-Nov-1955 [id number]   [facility] CSW spoke to patient today by phone- she reports she has hired someone to stay overnight at her home (5 nights weekly) to assist with her care. "It just started but is going well". She is feeling confident with this plan and encouraged it will allow her to be able to remain in her home.  CSW updated patient about her name on the waiting list for [employer] in home assistance- she is leaning towards not using them because of several factors and feels with her hired helpers and the [facility] agency visits from RN and bath aide, she will be "better off".  Patient advised CSW of plans for her a home visit today from her PCP office- "Dr. McDiarmid and another doctor are coming today".  CSW inquired with patient about other needs, concerns or support that CSW can provide- she denies any further needs at this time.   CSW plans f/u call next week and possible case closure-   Reece Levy, MSW, LCSW Clinical Social Worker  [facility] [employer] [phone]

## 2016-11-28 NOTE — Progress Notes (Signed)
Opened in error

## 2016-11-29 ENCOUNTER — Other Ambulatory Visit: Payer: Self-pay | Admitting: *Deleted

## 2016-11-29 ENCOUNTER — Telehealth: Payer: Self-pay | Admitting: Internal Medicine

## 2016-11-29 ENCOUNTER — Encounter: Payer: Self-pay | Admitting: Internal Medicine

## 2016-11-29 DIAGNOSIS — Z978 Presence of other specified devices: Secondary | ICD-10-CM | POA: Insufficient documentation

## 2016-11-29 DIAGNOSIS — Z960 Presence of urogenital implants: Secondary | ICD-10-CM

## 2016-11-29 HISTORY — DX: Presence of other specified devices: Z97.8

## 2016-11-29 NOTE — Progress Notes (Signed)
[facility] Phone: [phone]   Date of Visit: 11/28/2016   HPI: Home Visit  Visited Ms. Rewerts at her home as she has progressive MS, is bedbound, and has difficulty coming to clinic for visits.  Ms. Wilmarth has a few questions/concerns today.   Cramping hands:  - patient reports that since her lasix was increased, she has been having bilateral hand cramping.  - she has tried apple cider vinegar tablets but has found most relief with eating a spoon of mustard.   Hyperglycemia in the setting of DM2:  - patient reports that her cbgs have been significantly elevated in the last two weeks. She thinks this might be due to the decreased dose of Lantus that was prescribed at discharge from her last hospitalization. Per chart review this was in last June. She was discharged with Lantus 10 at bedtime (prior to this she was on Lantus 20 U per patient, but looking at the H&P from that admission her home dose was 10 units). Looking back in her chart, it looks like she was on 20 units around March 2018.  - she reports she checks her cbgs once a day in the morning (fasting) and has been getting in the upper 400s to 500s. Prior to this, she was well controlled (167-170s) - she takes Novolog as a sliding scale only in the morning before breakfast. Reports she takes 20 units of novolog for every 100 above 200 for cbg. As an example, she states that if her cbg is 500, then she would take 60 units of Novolog.  - no nausea or abdominal pain  - she has not changed her diet  - reports that she was on Doxycycline for 10 days which she just finished (cellulitis; prescribed 7/11)  Below is her cbg record from her meter:  7/25: HI 7/24: HI 7/22: HI 7/21: 545 7/20: HI 7/19: 491 7/17: HI 7/14: HI 7/13: 350  7/12:515 7/6: 220 7/5:215 7/3: 234 7/2: 345 7/1:227 6/30: 352    Chronic Prednisone Use:  - per chart review, patient has a history of adrenal insufficiency  - of note, she reports  that she is unable to afford other agents of treatment for her MS - she wonders if she would be able to come off of her prednisone. She does report that once she attempted to self-titrate off her steroids which led to a hospitalization.   We also discussed mood and memory. Reports that she does at times have difficulty finding the right words. We completed a MOCA and Geriatric Depression Scale. MOCA score 23/30. Geriatric Depression Scale score: 2 positives (negative screen)   IADLS:  - patient is able to use the telephone, is able to make a shopping list and shops online, is unable to prepare her food (family members and friends provide meals), needs help with home maintenance tasks, unable to do laundry, does not travel very much (need ambulance), and is able to handle finances independently.  ADLS: Dependent of all ADLs except she is able to feed herself - patient does have a motorized wheelchair and a hoyer lift - she does have home health and reports that she was recently able to get night coverage from 10pm to 8am.   ROS: See HPI.  PMFSH:  PMH: Progressive MS with spastic paraplegia  DM2 Chronic Venous Insuffiencey with history of recurrent cellulitis  Chronic Foley Catheter with history of UTIs  PHYSICAL EXAM: BP (!) 148/88   Temp 98.2 F (36.8  C)   LMP 10/07/2010  GEN: NAD, pleasant, sitting on sofa chair  HEENT:  EOMI, sclera clear  CV: RRR, no murmurs, rubs, or gallops PULM: CTAB, normal effort ABD: Soft, nontender, nondistended, NABS EXTR: lower extremities wrapped in una boot (changed today) PSYCH: Mood and affect euthymic, normal rate and volume of speech NEURO: Awake, alert, normal speech  ASSESSMENT/PLAN:  1. Hand cramps Possibly related to lasix use. She may need potassium supplements but we need to check BMP. Patient already has home health services. Will need to ask Ms. Tia Hill about the possibility of arranging a blood draw.   2. Uncontrolled type 2 diabetes  mellitus with complication, with long-term current use of insulin (HCC) Patient's novolog dosing is not consistent with the prescription instructions in her med list. But she reports of controlled cbgs a few weeks ago with the same regimen. Wonder if there is another infectious process that may be causing this cbg elevation (should we repeat UA and look at her lower extremities for signs of cellulitis?) or if this is just due to inadequate coverage. Will discuss with PCP on how to change this regimen.   3. On prednisone therapy History of adrenal insuffiencey. Patient would like to come off of prednisone. Will discuss with PCP.  She will need a long and very slow taper.    Palma Holter, MD PGY 3 [facility]

## 2016-11-29 NOTE — Addendum Note (Signed)
Addended by: Acquanetta Belling D on: 11/29/2016 03:34 PM   Modules accepted: Orders, Level of Service

## 2016-11-29 NOTE — Telephone Encounter (Signed)
Home Visit Follow Up Telephone Conversation:   Called Ms. Zuelke to discuss the following:   1. I am in the process of figuring out if [facility] can do a blood draw  2. For her diabetes, she has only been checking once a day and has only been using the sliding scale once in the morning. We decided to keep her insulin regimen the same, but decided to check cbg before lunch and dinner and use sliding scale if indicated. Patient to call me in 4 days to review cbgs (or sooner if she is having lows). We reviewed the symptoms of hypoglycemia and how to treat hypoglycemia.   3. [name] is okay with tapering off Prednisone. She is currently on 30mg  daily. We will do the following:  Prednisone 20mg  daily for 2 weeks, then 10mg  for 4 weeks, then 5mg  (duration TBD).  Patient to call immediately if she has symptoms.  Patient reports that she has plenty of Prednisone (and a pill cutter)  Will forward to [name] to review.   Palma Holter, MD PGY 3 Family Medicine

## 2016-11-29 NOTE — Patient Outreach (Signed)
[facility]) Care Management  11/29/2016  Barbara [name] 12-07-1955 [id number]   Transition of care call  Spoke with patient reports she is doing" fine".  Patient discussed that home health reports her legs wounds are healed and continuing with Unna boot changes twice weekly Tuesday and Thursday.  Patients urine is flowing well out of foley, she denies any new concerns with this, reports home health flushes twice weekly.  Asked about today's blood sugar reading reports she has not checked it yet for "various reasons", but she will. Verified she has needed supplies and reports she is taking insulin, denies having hypoglycemia symptoms. Patient continues to drink cranberry juice uses the low sugar options.  Reports yesterday blood sugar was near 500 began to discuss prescribed insulin regimen  she was taking it, patient states she has discussed elevated blood sugar concerns with Family MD that visited her at home on yesterday and they follow up with her regarding recommended changes.   Patient discussed she now has someone staying at night with her and that is working out okay,and because she is paying out expense she will not be able to afford to pursue medical alert system.   Plan Will plan transition of care follow up call in the next week Patient will continue to monitor blood sugars and administer insulin as prescribed. Reinforced importance of notifying MD of consistent elevated blood sugars.  Egbert Garibaldi, RN, [facility] Care Management,Care Management Coordinator  [phone]- Mobile [phone]- Toll Free Main Office

## 2016-11-29 NOTE — Progress Notes (Addendum)
Patient ID: Barbara [name], female   DOB: 03/02/1956, 61 y.o.   MRN: [id number] I have interviewed and examined the patient with Dr Ottie Glazier.  I agree with their assessments and plans as documented in their home visit note.   Years of formal education: 13 years Montreal Cognitive Assessment  11/29/2016  Visuospatial/ Executive (0/5) 3  Naming (0/3) 3  Attention: Read list of digits (0/2) 1  Attention: Read list of letters (0/1) 1  Attention: Serial 7 subtraction starting at 100 (0/3) 3  Language: Repeat phrase (0/2) 1  Language : Fluency (0/1) 0  Abstraction (0/2) 1  Delayed Recall (0/5) 5  Orientation (0/6) 5  Total 23  Adjusted Score (based on education) 23  MRI Brain 2012:  Focal T2 hyperintensity and subtle enhancement in the right optic nerve.  One other focal T2 hyperintensity which is adjacent to the left lateral ventricle and does reach the callosal septal margin. MRI Brain 2016: No new hyperintensity lesions.  TSH 2.13 (07/2016)    HIV 2016  SH: No smoking, 13 years of formal education A/  1) Mild Cognitive Impairment - MoCA < 24 but no dependence in complex instrumental activities of daily living.  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  3) Cramping of hands  - Possible origins Loop diuretics, electrolyte abnormality, and muscle spasms of MS  4) Reduced Glycemic Control in DMT2 - Possible origins include reduction in basal insulin daily dose after last hospitalization; Use of prandial Insulin only once a day; chronic systemic corticosteroid therapy, ongoing venous stasis dermatitis inflammation +/- leg cellulitis  Recommendations - Increase basal insulin therapy to higher dose prior to last hospitalization - Increase dosing of prandial insulin to more than once a day - Gradual  titration down of chronic prednisone - Request Home Health RN to draw serum electrolytes. - Trial of scheduled baclofen in addition to scheduled methocarbamol if hand spasms/cramps persist and disabiling with r/o of abnormal serum electrolytes and reduction in Loop diuretic dose.

## 2016-11-30 NOTE — Telephone Encounter (Signed)
Reviewed and agree.

## 2016-12-02 DIAGNOSIS — Z794 Long term (current) use of insulin: Secondary | ICD-10-CM | POA: Diagnosis not present

## 2016-12-02 DIAGNOSIS — Z7982 Long term (current) use of aspirin: Secondary | ICD-10-CM | POA: Diagnosis not present

## 2016-12-02 DIAGNOSIS — M48061 Spinal stenosis, lumbar region without neurogenic claudication: Secondary | ICD-10-CM | POA: Diagnosis not present

## 2016-12-02 DIAGNOSIS — M4802 Spinal stenosis, cervical region: Secondary | ICD-10-CM | POA: Diagnosis not present

## 2016-12-02 DIAGNOSIS — Z9981 Dependence on supplemental oxygen: Secondary | ICD-10-CM | POA: Diagnosis not present

## 2016-12-02 DIAGNOSIS — M75101 Unspecified rotator cuff tear or rupture of right shoulder, not specified as traumatic: Secondary | ICD-10-CM | POA: Diagnosis not present

## 2016-12-02 DIAGNOSIS — I10 Essential (primary) hypertension: Secondary | ICD-10-CM | POA: Diagnosis not present

## 2016-12-02 DIAGNOSIS — I872 Venous insufficiency (chronic) (peripheral): Secondary | ICD-10-CM | POA: Diagnosis not present

## 2016-12-02 DIAGNOSIS — L97212 Non-pressure chronic ulcer of right calf with fat layer exposed: Secondary | ICD-10-CM | POA: Diagnosis not present

## 2016-12-02 DIAGNOSIS — L97222 Non-pressure chronic ulcer of left calf with fat layer exposed: Secondary | ICD-10-CM | POA: Diagnosis not present

## 2016-12-02 DIAGNOSIS — E119 Type 2 diabetes mellitus without complications: Secondary | ICD-10-CM | POA: Diagnosis not present

## 2016-12-02 DIAGNOSIS — N390 Urinary tract infection, site not specified: Secondary | ICD-10-CM | POA: Diagnosis not present

## 2016-12-02 DIAGNOSIS — Z466 Encounter for fitting and adjustment of urinary device: Secondary | ICD-10-CM | POA: Diagnosis not present

## 2016-12-02 DIAGNOSIS — G35 Multiple sclerosis: Secondary | ICD-10-CM | POA: Diagnosis not present

## 2016-12-03 ENCOUNTER — Other Ambulatory Visit: Payer: Self-pay | Admitting: Internal Medicine

## 2016-12-03 DIAGNOSIS — G35 Multiple sclerosis: Secondary | ICD-10-CM

## 2016-12-04 ENCOUNTER — Other Ambulatory Visit: Payer: Self-pay | Admitting: *Deleted

## 2016-12-04 NOTE — Patient Outreach (Signed)
[facility]) Care Management  12/04/2016  Barbara [name] 05-02-1956 [id number]   Transition of care call  Placed call to patient, no answer able to leave a hipaa compliant message , providing return call number.  Transition of care 31 day program has been completed patient has not completed hospital readmission.    Plan  Await return call if no response will plan outreach call in the next week.   Egbert Garibaldi, RN, [facility] Care Management,Care Management Coordinator  [phone]- Mobile [phone]- Toll Free Main Office

## 2016-12-05 ENCOUNTER — Other Ambulatory Visit: Payer: Self-pay | Admitting: *Deleted

## 2016-12-05 ENCOUNTER — Ambulatory Visit: Payer: Self-pay | Admitting: *Deleted

## 2016-12-05 ENCOUNTER — Telehealth: Payer: Self-pay | Admitting: Internal Medicine

## 2016-12-05 ENCOUNTER — Telehealth: Payer: Self-pay | Admitting: *Deleted

## 2016-12-05 DIAGNOSIS — M75101 Unspecified rotator cuff tear or rupture of right shoulder, not specified as traumatic: Secondary | ICD-10-CM | POA: Diagnosis not present

## 2016-12-05 DIAGNOSIS — L97212 Non-pressure chronic ulcer of right calf with fat layer exposed: Secondary | ICD-10-CM | POA: Diagnosis not present

## 2016-12-05 DIAGNOSIS — Z466 Encounter for fitting and adjustment of urinary device: Secondary | ICD-10-CM | POA: Diagnosis not present

## 2016-12-05 DIAGNOSIS — Z794 Long term (current) use of insulin: Secondary | ICD-10-CM | POA: Diagnosis not present

## 2016-12-05 DIAGNOSIS — Z9981 Dependence on supplemental oxygen: Secondary | ICD-10-CM | POA: Diagnosis not present

## 2016-12-05 DIAGNOSIS — E119 Type 2 diabetes mellitus without complications: Secondary | ICD-10-CM | POA: Diagnosis not present

## 2016-12-05 DIAGNOSIS — Z7982 Long term (current) use of aspirin: Secondary | ICD-10-CM | POA: Diagnosis not present

## 2016-12-05 DIAGNOSIS — L97222 Non-pressure chronic ulcer of left calf with fat layer exposed: Secondary | ICD-10-CM | POA: Diagnosis not present

## 2016-12-05 DIAGNOSIS — G35 Multiple sclerosis: Secondary | ICD-10-CM | POA: Diagnosis not present

## 2016-12-05 DIAGNOSIS — I10 Essential (primary) hypertension: Secondary | ICD-10-CM | POA: Diagnosis not present

## 2016-12-05 DIAGNOSIS — M48061 Spinal stenosis, lumbar region without neurogenic claudication: Secondary | ICD-10-CM | POA: Diagnosis not present

## 2016-12-05 DIAGNOSIS — N390 Urinary tract infection, site not specified: Secondary | ICD-10-CM | POA: Diagnosis not present

## 2016-12-05 DIAGNOSIS — M4802 Spinal stenosis, cervical region: Secondary | ICD-10-CM | POA: Diagnosis not present

## 2016-12-05 DIAGNOSIS — I872 Venous insufficiency (chronic) (peripheral): Secondary | ICD-10-CM | POA: Diagnosis not present

## 2016-12-05 NOTE — Telephone Encounter (Signed)
Called my Kintigh to discuss cbgs. Reports they are still in the 500s. We decided to go up on Lantus to 13 units, then increase nightly dose of Lantus by 1 unit with AM fasting sugar is > 200. I will call patient in 1 week, but patient to call clinic sooner if numbers are not going down.

## 2016-12-05 NOTE — Telephone Encounter (Signed)
[facility] calling patient speak with her regarding there catheter. No one answered.  Clovis Pu, RN

## 2016-12-05 NOTE — Patient Outreach (Signed)
[facility]) Care Management  12/05/2016  Barbara [name] 1956-02-10 [id number]   CSW has attempted to reach patient again today for follow up and to determine her interest in CSW assessment/visit.  CSW left HIPPA compliant message for her on both #'s. CSW will try again next week if no return call is received.    Reece Levy, MSW, LCSW Clinical Social Worker  [facility] [employer] [phone]

## 2016-12-05 NOTE — Patient Outreach (Signed)
[facility]) Care Management  12/05/2016  Taysha Majewski Jun 16, 1955 [id number]   Received incoming returned call from patient, after missed call on yesterday. Patient left voice mail message  identified herself, reports things are going well, consider this our weekly phone call and don't try to call back for this week.  Patient states everything is fine no new assistance needed .  Plan Will honor patient request for this week and  follow up with patient by telephone in the next week.   Egbert Garibaldi, RN, [facility] Care Management,Care Management Coordinator  [phone]- Mobile [phone]- Toll Free Main Office

## 2016-12-05 NOTE — Telephone Encounter (Signed)
Victorino Dike, RN with [facility] home health called to triage patient's foley catheter.  Patient catheter was clogged yesterday and home health nurse went out and placed a new cath. There was moderate amount of blood and clots noticed.  Jennifer at the home this morning about 7:30 AM and noticed that the catheter still had blood and clots.  She advised patient that she needed to be seen, however patient declined. Patient reported to [employer] that PCP was not going to call in any antibiotics. If the problem was continued she would be placed in nursing home.  Precept with Dr. Gwendolyn Grant, agreed patient needed to be seen here at [facility] or local urgent care. Patient still refused.  Victorino Dike was going to call patient again to see if she would go to ED by EMS due to her history of MS.  Nurse at [facility] was also going to call the patient to see if she could talk to patient about going to ED.  Clovis Pu, RN

## 2016-12-07 ENCOUNTER — Inpatient Hospital Stay (HOSPITAL_COMMUNITY)
Admission: EM | Admit: 2016-12-07 | Discharge: 2016-12-16 | DRG: 698 | Disposition: A | Discharge status: Hospice | Payer: PPO | Attending: Family Medicine | Admitting: Family Medicine

## 2016-12-07 ENCOUNTER — Emergency Department (HOSPITAL_COMMUNITY): Payer: PPO

## 2016-12-07 ENCOUNTER — Encounter (HOSPITAL_COMMUNITY): Payer: Self-pay | Admitting: Radiology

## 2016-12-07 DIAGNOSIS — J189 Pneumonia, unspecified organism: Secondary | ICD-10-CM | POA: Diagnosis not present

## 2016-12-07 DIAGNOSIS — E662 Morbid (severe) obesity with alveolar hypoventilation: Secondary | ICD-10-CM | POA: Diagnosis not present

## 2016-12-07 DIAGNOSIS — E2749 Other adrenocortical insufficiency: Secondary | ICD-10-CM | POA: Diagnosis not present

## 2016-12-07 DIAGNOSIS — T3695XA Adverse effect of unspecified systemic antibiotic, initial encounter: Secondary | ICD-10-CM | POA: Diagnosis not present

## 2016-12-07 DIAGNOSIS — R319 Hematuria, unspecified: Secondary | ICD-10-CM

## 2016-12-07 DIAGNOSIS — G43109 Migraine with aura, not intractable, without status migrainosus: Secondary | ICD-10-CM | POA: Diagnosis present

## 2016-12-07 DIAGNOSIS — Z978 Presence of other specified devices: Secondary | ICD-10-CM

## 2016-12-07 DIAGNOSIS — Z7952 Long term (current) use of systemic steroids: Secondary | ICD-10-CM

## 2016-12-07 DIAGNOSIS — I872 Venous insufficiency (chronic) (peripheral): Secondary | ICD-10-CM | POA: Diagnosis present

## 2016-12-07 DIAGNOSIS — T83098A Other mechanical complication of other indwelling urethral catheter, initial encounter: Secondary | ICD-10-CM | POA: Diagnosis not present

## 2016-12-07 DIAGNOSIS — G35 Multiple sclerosis: Secondary | ICD-10-CM

## 2016-12-07 DIAGNOSIS — I10 Essential (primary) hypertension: Secondary | ICD-10-CM | POA: Diagnosis not present

## 2016-12-07 DIAGNOSIS — Z833 Family history of diabetes mellitus: Secondary | ICD-10-CM

## 2016-12-07 DIAGNOSIS — T368X5A Adverse effect of other systemic antibiotics, initial encounter: Secondary | ICD-10-CM | POA: Diagnosis not present

## 2016-12-07 DIAGNOSIS — E1136 Type 2 diabetes mellitus with diabetic cataract: Secondary | ICD-10-CM | POA: Diagnosis present

## 2016-12-07 DIAGNOSIS — R0902 Hypoxemia: Secondary | ICD-10-CM | POA: Diagnosis not present

## 2016-12-07 DIAGNOSIS — N939 Abnormal uterine and vaginal bleeding, unspecified: Secondary | ICD-10-CM | POA: Diagnosis not present

## 2016-12-07 DIAGNOSIS — Z7982 Long term (current) use of aspirin: Secondary | ICD-10-CM

## 2016-12-07 DIAGNOSIS — R928 Other abnormal and inconclusive findings on diagnostic imaging of breast: Secondary | ICD-10-CM | POA: Diagnosis not present

## 2016-12-07 DIAGNOSIS — T83091A Other mechanical complication of indwelling urethral catheter, initial encounter: Secondary | ICD-10-CM | POA: Diagnosis not present

## 2016-12-07 DIAGNOSIS — N3001 Acute cystitis with hematuria: Secondary | ICD-10-CM | POA: Diagnosis present

## 2016-12-07 DIAGNOSIS — T83510D Infection and inflammatory reaction due to cystostomy catheter, subsequent encounter: Secondary | ICD-10-CM | POA: Diagnosis not present

## 2016-12-07 DIAGNOSIS — J9602 Acute respiratory failure with hypercapnia: Secondary | ICD-10-CM | POA: Diagnosis not present

## 2016-12-07 DIAGNOSIS — L03116 Cellulitis of left lower limb: Secondary | ICD-10-CM | POA: Diagnosis present

## 2016-12-07 DIAGNOSIS — T83518A Infection and inflammatory reaction due to other urinary catheter, initial encounter: Principal | ICD-10-CM | POA: Diagnosis present

## 2016-12-07 DIAGNOSIS — N319 Neuromuscular dysfunction of bladder, unspecified: Secondary | ICD-10-CM | POA: Diagnosis not present

## 2016-12-07 DIAGNOSIS — I272 Pulmonary hypertension, unspecified: Secondary | ICD-10-CM | POA: Diagnosis not present

## 2016-12-07 DIAGNOSIS — E1165 Type 2 diabetes mellitus with hyperglycemia: Secondary | ICD-10-CM | POA: Diagnosis present

## 2016-12-07 DIAGNOSIS — M4802 Spinal stenosis, cervical region: Secondary | ICD-10-CM | POA: Diagnosis not present

## 2016-12-07 DIAGNOSIS — Z888 Allergy status to other drugs, medicaments and biological substances status: Secondary | ICD-10-CM

## 2016-12-07 DIAGNOSIS — B999 Unspecified infectious disease: Secondary | ICD-10-CM | POA: Diagnosis not present

## 2016-12-07 DIAGNOSIS — E876 Hypokalemia: Secondary | ICD-10-CM | POA: Diagnosis not present

## 2016-12-07 DIAGNOSIS — E119 Type 2 diabetes mellitus without complications: Secondary | ICD-10-CM

## 2016-12-07 DIAGNOSIS — R531 Weakness: Secondary | ICD-10-CM | POA: Diagnosis not present

## 2016-12-07 DIAGNOSIS — T3695XD Adverse effect of unspecified systemic antibiotic, subsequent encounter: Secondary | ICD-10-CM | POA: Diagnosis not present

## 2016-12-07 DIAGNOSIS — N3000 Acute cystitis without hematuria: Secondary | ICD-10-CM

## 2016-12-07 DIAGNOSIS — N390 Urinary tract infection, site not specified: Secondary | ICD-10-CM | POA: Diagnosis present

## 2016-12-07 DIAGNOSIS — H5461 Unqualified visual loss, right eye, normal vision left eye: Secondary | ICD-10-CM | POA: Diagnosis present

## 2016-12-07 DIAGNOSIS — I5033 Acute on chronic diastolic (congestive) heart failure: Secondary | ICD-10-CM | POA: Diagnosis not present

## 2016-12-07 DIAGNOSIS — R739 Hyperglycemia, unspecified: Secondary | ICD-10-CM | POA: Diagnosis not present

## 2016-12-07 DIAGNOSIS — E11649 Type 2 diabetes mellitus with hypoglycemia without coma: Secondary | ICD-10-CM | POA: Diagnosis not present

## 2016-12-07 DIAGNOSIS — Y846 Urinary catheterization as the cause of abnormal reaction of the patient, or of later complication, without mention of misadventure at the time of the procedure: Secondary | ICD-10-CM | POA: Diagnosis present

## 2016-12-07 DIAGNOSIS — M62838 Other muscle spasm: Secondary | ICD-10-CM | POA: Diagnosis present

## 2016-12-07 DIAGNOSIS — T83510S Infection and inflammatory reaction due to cystostomy catheter, sequela: Secondary | ICD-10-CM | POA: Diagnosis not present

## 2016-12-07 DIAGNOSIS — R400 Somnolence: Secondary | ICD-10-CM

## 2016-12-07 DIAGNOSIS — A419 Sepsis, unspecified organism: Secondary | ICD-10-CM | POA: Diagnosis not present

## 2016-12-07 DIAGNOSIS — L8990 Pressure ulcer of unspecified site, unspecified stage: Secondary | ICD-10-CM | POA: Insufficient documentation

## 2016-12-07 DIAGNOSIS — R0602 Shortness of breath: Secondary | ICD-10-CM

## 2016-12-07 DIAGNOSIS — Z6841 Body Mass Index (BMI) 40.0 and over, adult: Secondary | ICD-10-CM | POA: Diagnosis not present

## 2016-12-07 DIAGNOSIS — R1030 Lower abdominal pain, unspecified: Secondary | ICD-10-CM | POA: Diagnosis present

## 2016-12-07 DIAGNOSIS — J9622 Acute and chronic respiratory failure with hypercapnia: Secondary | ICD-10-CM | POA: Diagnosis not present

## 2016-12-07 DIAGNOSIS — R4182 Altered mental status, unspecified: Secondary | ICD-10-CM | POA: Diagnosis not present

## 2016-12-07 DIAGNOSIS — I5032 Chronic diastolic (congestive) heart failure: Secondary | ICD-10-CM

## 2016-12-07 DIAGNOSIS — G8220 Paraplegia, unspecified: Secondary | ICD-10-CM | POA: Diagnosis not present

## 2016-12-07 DIAGNOSIS — Z87440 Personal history of urinary (tract) infections: Secondary | ICD-10-CM

## 2016-12-07 DIAGNOSIS — M545 Low back pain: Secondary | ICD-10-CM | POA: Diagnosis present

## 2016-12-07 DIAGNOSIS — T83510A Infection and inflammatory reaction due to cystostomy catheter, initial encounter: Secondary | ICD-10-CM | POA: Diagnosis not present

## 2016-12-07 DIAGNOSIS — M6281 Muscle weakness (generalized): Secondary | ICD-10-CM | POA: Diagnosis not present

## 2016-12-07 DIAGNOSIS — J9620 Acute and chronic respiratory failure, unspecified whether with hypoxia or hypercapnia: Secondary | ICD-10-CM | POA: Diagnosis not present

## 2016-12-07 DIAGNOSIS — Z7401 Bed confinement status: Secondary | ICD-10-CM

## 2016-12-07 DIAGNOSIS — L89152 Pressure ulcer of sacral region, stage 2: Secondary | ICD-10-CM | POA: Diagnosis not present

## 2016-12-07 DIAGNOSIS — I959 Hypotension, unspecified: Secondary | ICD-10-CM | POA: Diagnosis not present

## 2016-12-07 DIAGNOSIS — J9621 Acute and chronic respiratory failure with hypoxia: Secondary | ICD-10-CM | POA: Diagnosis not present

## 2016-12-07 DIAGNOSIS — L270 Generalized skin eruption due to drugs and medicaments taken internally: Secondary | ICD-10-CM | POA: Diagnosis not present

## 2016-12-07 DIAGNOSIS — Z960 Presence of urogenital implants: Secondary | ICD-10-CM

## 2016-12-07 DIAGNOSIS — R310 Gross hematuria: Secondary | ICD-10-CM | POA: Diagnosis not present

## 2016-12-07 DIAGNOSIS — R7889 Finding of other specified substances, not normally found in blood: Secondary | ICD-10-CM | POA: Diagnosis not present

## 2016-12-07 DIAGNOSIS — T83511D Infection and inflammatory reaction due to indwelling urethral catheter, subsequent encounter: Secondary | ICD-10-CM | POA: Diagnosis not present

## 2016-12-07 DIAGNOSIS — Z66 Do not resuscitate: Secondary | ICD-10-CM | POA: Diagnosis not present

## 2016-12-07 DIAGNOSIS — N318 Other neuromuscular dysfunction of bladder: Secondary | ICD-10-CM | POA: Diagnosis not present

## 2016-12-07 DIAGNOSIS — Z794 Long term (current) use of insulin: Secondary | ICD-10-CM

## 2016-12-07 DIAGNOSIS — Z9289 Personal history of other medical treatment: Secondary | ICD-10-CM | POA: Diagnosis not present

## 2016-12-07 DIAGNOSIS — Z881 Allergy status to other antibiotic agents status: Secondary | ICD-10-CM

## 2016-12-07 DIAGNOSIS — N952 Postmenopausal atrophic vaginitis: Secondary | ICD-10-CM | POA: Diagnosis present

## 2016-12-07 LAB — I-STAT CHEM 8, ED
BUN: 18 mg/dL (ref 6–20)
Calcium, Ion: 1.12 mmol/L — ABNORMAL LOW (ref 1.15–1.40)
Chloride: 85 mmol/L — ABNORMAL LOW (ref 101–111)
Creatinine, Ser: 0.8 mg/dL (ref 0.44–1.00)
Glucose, Bld: 533 mg/dL (ref 65–99)
HEMATOCRIT: 42 % (ref 36.0–46.0)
HEMOGLOBIN: 14.3 g/dL (ref 12.0–15.0)
POTASSIUM: 3.7 mmol/L (ref 3.5–5.1)
SODIUM: 134 mmol/L — AB (ref 135–145)
TCO2: 36 mmol/L (ref 0–100)

## 2016-12-07 LAB — URINALYSIS, ROUTINE W REFLEX MICROSCOPIC
Bilirubin Urine: NEGATIVE
KETONES UR: 5 mg/dL — AB
NITRITE: NEGATIVE
PH: 7 (ref 5.0–8.0)
PROTEIN: NEGATIVE mg/dL
Specific Gravity, Urine: 1.015 (ref 1.005–1.030)

## 2016-12-07 LAB — GLUCOSE, CAPILLARY
GLUCOSE-CAPILLARY: 525 mg/dL — AB (ref 65–99)
Glucose-Capillary: 444 mg/dL — ABNORMAL HIGH (ref 65–99)

## 2016-12-07 LAB — CBC
HEMATOCRIT: 41.2 % (ref 36.0–46.0)
Hemoglobin: 12.8 g/dL (ref 12.0–15.0)
MCH: 26.4 pg (ref 26.0–34.0)
MCHC: 31.1 g/dL (ref 30.0–36.0)
MCV: 85.1 fL (ref 78.0–100.0)
Platelets: 232 10*3/uL (ref 150–400)
RBC: 4.84 MIL/uL (ref 3.87–5.11)
RDW: 19.3 % — ABNORMAL HIGH (ref 11.5–15.5)
WBC: 11.1 10*3/uL — AB (ref 4.0–10.5)

## 2016-12-07 LAB — BLOOD GAS, VENOUS
Acid-Base Excess: 9.9 mmol/L — ABNORMAL HIGH (ref 0.0–2.0)
BICARBONATE: 38.1 mmol/L — AB (ref 20.0–28.0)
O2 Saturation: 85.1 %
PH VEN: 7.349 (ref 7.250–7.430)
Patient temperature: 98.6
pCO2, Ven: 71.1 mmHg (ref 44.0–60.0)
pO2, Ven: 57.4 mmHg — ABNORMAL HIGH (ref 32.0–45.0)

## 2016-12-07 LAB — I-STAT CG4 LACTIC ACID, ED: Lactic Acid, Venous: 1.75 mmol/L (ref 0.5–1.9)

## 2016-12-07 LAB — CBG MONITORING, ED
Glucose-Capillary: 555 mg/dL (ref 65–99)
Glucose-Capillary: 505 mg/dL (ref 65–99)

## 2016-12-07 MED ORDER — FUROSEMIDE 40 MG PO TABS
40.0000 mg | ORAL_TABLET | Freq: Two times a day (BID) | ORAL | Status: DC
  Administered 2016-12-07 – 2016-12-08 (×3): 40 mg via ORAL
  Filled 2016-12-07 (×3): qty 1

## 2016-12-07 MED ORDER — PREDNISONE 20 MG PO TABS
20.0000 mg | ORAL_TABLET | Freq: Every day | ORAL | Status: DC
  Administered 2016-12-08: 20 mg via ORAL
  Filled 2016-12-07: qty 1

## 2016-12-07 MED ORDER — VANCOMYCIN HCL IN DEXTROSE 1-5 GM/200ML-% IV SOLN
1000.0000 mg | Freq: Two times a day (BID) | INTRAVENOUS | Status: DC
  Filled 2016-12-07: qty 200

## 2016-12-07 MED ORDER — AZITHROMYCIN 500 MG PO TABS
500.0000 mg | ORAL_TABLET | Freq: Every day | ORAL | Status: DC
  Administered 2016-12-07 – 2016-12-08 (×2): 500 mg via ORAL
  Filled 2016-12-07 (×3): qty 1

## 2016-12-07 MED ORDER — IOPAMIDOL (ISOVUE-370) INJECTION 76%
INTRAVENOUS | Status: AC
  Administered 2016-12-07: 100 mL
  Filled 2016-12-07: qty 100

## 2016-12-07 MED ORDER — MAGNESIUM GLUCONATE 500 MG PO TABS
250.0000 mg | ORAL_TABLET | Freq: Every evening | ORAL | Status: DC
  Administered 2016-12-07 – 2016-12-16 (×9): 250 mg via ORAL
  Filled 2016-12-07 (×11): qty 1

## 2016-12-07 MED ORDER — MAGNESIUM 250 MG PO TABS: 250.0000 mg | ORAL_TABLET | Freq: Every evening | ORAL | Status: DC

## 2016-12-07 MED ORDER — INSULIN ASPART 100 UNIT/ML ~~LOC~~ SOLN
12.0000 [IU] | Freq: Once | SUBCUTANEOUS | Status: AC
  Administered 2016-12-07: 12 [IU] via SUBCUTANEOUS

## 2016-12-07 MED ORDER — INSULIN GLARGINE 100 UNIT/ML SOLOSTAR PEN: 10.0000 [IU] | PEN_INJECTOR | Freq: Every day | SUBCUTANEOUS | Status: DC

## 2016-12-07 MED ORDER — ACETAMINOPHEN 325 MG PO TABS
650.0000 mg | ORAL_TABLET | Freq: Four times a day (QID) | ORAL | Status: DC | PRN
  Administered 2016-12-08 – 2016-12-11 (×3): 650 mg via ORAL
  Filled 2016-12-07 (×4): qty 2

## 2016-12-07 MED ORDER — ENOXAPARIN SODIUM 40 MG/0.4ML ~~LOC~~ SOLN
40.0000 mg | SUBCUTANEOUS | Status: DC
  Administered 2016-12-07 – 2016-12-11 (×5): 40 mg via SUBCUTANEOUS
  Filled 2016-12-07 (×5): qty 0.4

## 2016-12-07 MED ORDER — DEXTROSE 5 % IV SOLN
1.0000 g | INTRAVENOUS | Status: DC
  Administered 2016-12-08: 1 g via INTRAVENOUS
  Filled 2016-12-07: qty 10

## 2016-12-07 MED ORDER — INSULIN ASPART 100 UNIT/ML ~~LOC~~ SOLN
10.0000 [IU] | Freq: Once | SUBCUTANEOUS | Status: AC
  Administered 2016-12-07: 10 [IU] via SUBCUTANEOUS

## 2016-12-07 MED ORDER — INSULIN ASPART 100 UNIT/ML ~~LOC~~ SOLN: 12.0000 [IU] | Freq: Once | SUBCUTANEOUS | Status: DC

## 2016-12-07 MED ORDER — VANCOMYCIN HCL 10 G IV SOLR
2000.0000 mg | Freq: Once | INTRAVENOUS | Status: DC
  Filled 2016-12-07: qty 2000

## 2016-12-07 MED ORDER — INSULIN ASPART 100 UNIT/ML ~~LOC~~ SOLN
0.0000 [IU] | Freq: Three times a day (TID) | SUBCUTANEOUS | Status: DC
Start: 2016-12-08 — End: 2016-12-08

## 2016-12-07 MED ORDER — INSULIN GLARGINE 100 UNIT/ML ~~LOC~~ SOLN
10.0000 [IU] | Freq: Every day | SUBCUTANEOUS | Status: DC
  Administered 2016-12-07: 10 [IU] via SUBCUTANEOUS
  Filled 2016-12-07: qty 0.1

## 2016-12-07 MED ORDER — TRAMADOL HCL 50 MG PO TABS
50.0000 mg | ORAL_TABLET | Freq: Four times a day (QID) | ORAL | Status: DC | PRN
  Administered 2016-12-07 – 2016-12-08 (×2): 50 mg via ORAL
  Filled 2016-12-07 (×2): qty 1

## 2016-12-07 MED ORDER — ASPIRIN EC 325 MG PO TBEC
325.0000 mg | DELAYED_RELEASE_TABLET | Freq: Every evening | ORAL | Status: DC
  Administered 2016-12-07 – 2016-12-16 (×9): 325 mg via ORAL
  Filled 2016-12-07 (×9): qty 1

## 2016-12-07 MED ORDER — DOCUSATE SODIUM 100 MG PO CAPS
100.0000 mg | ORAL_CAPSULE | Freq: Two times a day (BID) | ORAL | Status: DC
  Administered 2016-12-07 – 2016-12-15 (×9): 100 mg via ORAL
  Filled 2016-12-07 (×13): qty 1

## 2016-12-07 MED ORDER — METHOCARBAMOL 750 MG PO TABS
1500.0000 mg | ORAL_TABLET | Freq: Three times a day (TID) | ORAL | Status: DC
  Administered 2016-12-07 – 2016-12-08 (×3): 1500 mg via ORAL
  Filled 2016-12-07 (×3): qty 2

## 2016-12-07 MED ORDER — ACETAMINOPHEN 650 MG RE SUPP
650.0000 mg | Freq: Four times a day (QID) | RECTAL | Status: DC | PRN
  Administered 2016-12-09: 650 mg via RECTAL
  Filled 2016-12-07: qty 1

## 2016-12-07 MED ORDER — GABAPENTIN 600 MG PO TABS
600.0000 mg | ORAL_TABLET | Freq: Three times a day (TID) | ORAL | Status: DC
  Administered 2016-12-07 – 2016-12-08 (×4): 600 mg via ORAL
  Filled 2016-12-07 (×4): qty 1

## 2016-12-07 MED ORDER — INSULIN ASPART PROT & ASPART (70-30 MIX) 100 UNIT/ML ~~LOC~~ SUSP
10.0000 [IU] | Freq: Once | SUBCUTANEOUS | Status: AC
  Administered 2016-12-07: 10 [IU] via SUBCUTANEOUS
  Filled 2016-12-07: qty 10

## 2016-12-07 MED ORDER — DIAZEPAM 5 MG PO TABS
5.0000 mg | ORAL_TABLET | Freq: Four times a day (QID) | ORAL | Status: DC | PRN
  Administered 2016-12-08: 5 mg via ORAL
  Filled 2016-12-07 (×2): qty 1

## 2016-12-07 MED ORDER — DEXTROSE 5 % IV SOLN
1.0000 g | Freq: Once | INTRAVENOUS | Status: AC
  Administered 2016-12-07: 1 g via INTRAVENOUS
  Filled 2016-12-07: qty 10

## 2016-12-07 NOTE — H&P (Signed)
Family Medicine Teaching [facility] Admission History and Physical Service Pager: [phone]  Patient name: Barbara [name] Medical record number: [id number] Date of birth: 1955-10-19 Age: 61 y.o. Gender: female  Primary Care Provider: Moses Manners, MD Consultants: none Code Status: DNR  Chief Complaint: dysuria  Assessment and Plan: [name] is a 60 y.o. 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 PMH is significant for multiple sclerosis with spastic paraplegia, chronic prednisone use, indwelling chronic Foley, left lower extremity cellulitis, HTN, T2DM.   Dysuria: Patient with a history of frequent UTIs due to chronic indwelling Foley and neurogenic bladder From MS. Is on prophylactic antibiotics at home currently nitrofurantoin. 3 days prior to admission she noted urinary urge and blocked Foley catheter, home health nurse came and flush the catheter finding hematuria and clots. Patient endorsed suprapubic pain since this event. No fever at home, afebrile here. Foley catheter exchanged in the ED and urine collected from new catheter suspicious for infection. Ceftriaxone given in the ED, as last urine culture with Proteus sensitive to this. Vital signs stable, no CVA tenderness, decreased concern for pyelonephritis. Patient with a history of atrophic vaginitis due to postmenopausal as well as chronic indwelling Foley. Consider vaginal estrogen. -Continue ceftriaxone -Monitor urine output -consider vaginal estrogen if persistent pain  -follow urine culture   Hypoxia: Hypoxia on presentation with EMS to 82% per report. Patient denies shortness of breath, chest x-ray without clear source of infection. Given hypoxia, CT chest was performed in the ED without evidence of PE, but with possible pneumonia in the left lower lobe. Notably, on PCPs discussion on 6/26, patient endorses coughing with feeding and understands the risk that she  could aspirate. Will treat as pneumonia, consider discussion of modification of diet with patient prior to ordering speech therapy evaluation. See goals of care below. Currently weaned to 1 L nasal cannula, suspect obesity hypoventilation syndrome as a portion of the etiology of her hypoxia, this is been off and on over the past year. Given that she is immobile, and unlikely to trip over it, home oxygen may be a reasonable consideration for her to avoid future admissions. -azithromycin 500 mg daily -Ceftriaxone as above -Consider home oxygen -Wean nasal cannula as tolerated -Discuss goals of care prior to ordering speech evaluation  Multiple Sclerosis with spastic paraplegia on chronic steroids:Followed by [facility] neurology. Was previously on immunemodulator, but was unable to afford this after becoming disabled. Currently taking 20mg  prednisone at home. Plan for very slow taper of prednisone made at recent home visit, refer to Dr. Deland Pretty note on 7/26, to decrease to 15 mg on 8/13. - Continue home Valium, gabapentin, Robaxin, Tramadol - Given clinically well appearance, no need for stress steroid dosing at present  HTN: Currently normotensive. On Lasix 40 mg twice a day at home.  - Continue home Lasix  Hx of adrenal insufficiency: See plan for multiple sclerosis above. Continue home 20 mg prednisone. - Continue prednisone  T2DM:Insulin regimen prior to arrival unclear, CBGs poorly controlled when home visit was conducted on 7/26. Unclear if this poor control is due to infection versus confusion about insulin regimen. Hypoglycemia at present, but no signs of acidosis. Will continue to chase blood sugars with short acting NovoLog. - Decrease Lantus to 10U qhs during admission - mSSI - CBG qACHS - Clarify insulin regimen with PCP prior to discharge  Goals of care: Patient has a long-standing relationship with PCP, [name]. Long discussion of goals of  care at last admission Included  completing and most form. At this time, patient is DO NOT RESUSCITATE/DO NOT INTUBATE, does not desire ICU admission, does not desire dialysis, does not desire feeding tube. She is amenable to short-term hospitalizations for IV fluids and antibiotics. -She notes that she is comfortable going home with oxygen if necessary -Continue to reevaluate goals of care  FEN/GI: carb mod diet Prophylaxis: lovenox  Disposition: home pending clinical improvement  History of Present Illness:  Barbara [name] is a 61 y.o. female presenting with dysuria. Patient well known to our service. She has a chronic indwelling Foley secondary to neurogenic bladder from MS. She notes that Wednesday evening she had a strong urge to urinate, but could not. She reports that there was no output from the Foley at that time. Called on call home health nurse, who came and flushed the catheter finding hematuria and clots. Patient had suprapubic pain since Wednesday, but denies fevers or other systemic symptoms. Presented today due to continued pain, elevated blood sugar. With EMS, CBG greater than 600. Denies chest pain, shortness of breath, changes in bowel habits. Reports that her muscle spasms are at baseline. In good spirits.  In ED, Foley catheter was exchanged and replaced. Urine sent for urinalysis was obtained from new Foley catheter. Patient did not meet sepsis criteria, normotensive, afebrile. CBG elevated greater than 500. Chest x-ray with no acute process, given hypoxia, CT imaging of chest obtained which did not find PE but did find suspicion of left lower lobe pneumonia. Patient transferred to [facility]  Review Of Systems: Per HPI with the following additions: HPI  ROS  Patient Active Problem List   Diagnosis Date Noted  . Hypoxia 12/07/2016  . Chronic indwelling Foley catheter 11/29/2016  . Cellulitis, leg 09/24/2016  . Lower extremity weakness 04/02/2016  . Palliative care encounter 03/07/2016  . Current chronic use  of systemic steroids 03/07/2016  . DM type 2, not at goal [facility]) 03/06/2016  . Weakness of both lower extremities   . Absence of bladder continence   . Essential hypertension   . Encounter for chronic pain management 04/21/2014  . Chronic venous insufficiency 11/05/2012  . Cervical myelopathy (HCC) 08/07/2011  . Spinal stenosis in cervical region 06/03/2011  . Multiple sclerosis (HCC) 12/07/2010  . 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 Spinal stenosis of lumbar region 12/07/2010  . Morbid obesity (HCC) 12/07/2010  . Chronic gout 12/07/2010    Past Medical History: Past Medical History:  Diagnosis Date  . Absence of bladder continence   . 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 Cellulitis, leg 09/24/2016  . Cervical myelopathy (HCC) 08/07/2011  . Chronic gout 12/07/2010   Infrequent flairs   . Chronic indwelling Foley catheter 11/29/2016  . Chronic venous insufficiency 11/05/2012  . Current chronic use of systemic steroids 03/07/2016   No shingles vaccine  . Decubitus ulcer of sacral region   . Diabetes mellitus without complication (HCC)   . DM type 2, not at goal [facility]) 03/06/2016  . Essential hypertension   . Gout   . Hypertension   . Hypoxia   . Impaired mobility and activities of daily living 03/07/2016  . Lower back pain   . Lower extremity weakness 04/02/2016  . Lumbar spondylosis   . Morbid obesity (HCC) 12/07/2010   Has always been large.  Since MS has been trying and partially successful at wt loss.     . MS (multiple sclerosis) (HCC)   . Multiple sclerosis (HCC) 12/07/2010  Diagnosed 05/2010.  Managed by Dr. Sandria Manly   . Numbness   . Shortness of breath   . Spastic paraplegia   . Spinal stenosis   . Spinal stenosis in cervical region 06/03/2011    [facility] (Dr. Timoteo Ace) anterior discectomies and fusion at C5-6,6-7 & 7-1 done on 06/14/11.    Marland Kitchen Spinal stenosis of lumbar region 12/07/2010   Chronic low back pain.  Left leg weak but unclear if due to MS or spinal stenosis.  Pain well controled by tramadol.     Now (Jan 2013)  Managed  [facility], who plans at a later date lumbar laminectomy L2-4.   Marland Kitchen Weakness   . Weakness of both lower extremities     Past Surgical History: Past Surgical History:  Procedure Laterality Date  . CERVICAL DISC SURGERY     Qusetions C5-6 C6-7 and C7-T1    Social History: Social History  Substance Use Topics  . Smoking status: Never Smoker  . Smokeless tobacco: Never Used  . Alcohol use No   Additional social history: Lives alone with her dog, Vincenza Hews  Please also refer to relevant sections of EMR.  Family History: Family History  Problem Relation Age of Onset  . Heart disease Mother   . Diabetes Mother   . Cancer Father     Allergies and Medications: Allergies  Allergen Reactions  . Metformin And Related Diarrhea and Nausea And Vomiting   No current facility-administered medications on file prior to encounter.    Current Outpatient Prescriptions on File Prior to Encounter  Medication Sig Dispense Refill  . Apple Cider Vinegar 188 MG CAPS Take 188 mg by mouth 2 (two) times daily.     Marland Kitchen aspirin EC 325 MG tablet Take 325 mg by mouth every evening.     . Cholecalciferol (VITAMIN D3) 1000 units CAPS Take 4,000 Units by mouth every evening.     . Coenzyme Q10 (COQ10) 100 MG CAPS Take 100 mg by mouth every evening.    . diazepam (VALIUM) 5 MG tablet TAKE 1 TABLET BY MOUTH EVERY 6 HOURS AS NEEDED FOR LEG SPASMS (Patient taking differently: Take 5 mg by mouth every 6 (six) hours as needed for muscle spasms. ) 90 tablet 5  . furosemide (LASIX) 40 MG tablet Take 1 tablet (40 mg total) by mouth 2 (two) times daily. 180 tablet 3  . gabapentin (NEURONTIN) 600 MG tablet Take 1 tablet (600 mg total) by mouth 3 (three) times daily. 180 tablet 2  . insulin aspart (NOVOLOG) 100 UNIT/ML FlexPen 10 units [place] for every 100 mg/dL above fasting 098 mg/dl once in morning 15 mL 11  . Insulin Glargine (LANTUS) 100 UNIT/ML Solostar Pen Inject 10 Units into the skin daily at 10  pm. (Patient taking differently: Inject 13 Units into the skin at bedtime. ) 15 mL 11  . Magnesium 250 MG TABS Take 250 mg by mouth every evening.     . methocarbamol (ROBAXIN) 750 MG tablet TAKE 2 TABLETS (1,500 MG TOTAL) BY MOUTH 3 (THREE) TIMES DAILY. 540 tablet 3  . Multiple Vitamin (MULTIVITAMIN WITH MINERALS) TABS tablet Take 1 tablet by mouth every evening.    . nitrofurantoin (MACRODANTIN) 50 MG capsule Take 1 capsule (50 mg total) by mouth 2 (two) times daily. 60 capsule 12  . predniSONE (DELTASONE) 20 MG tablet Take 20 mg by mouth daily with breakfast.     . traMADol (ULTRAM) 50 MG tablet TAKE 1 TABLET BY MOUTH 4 TIMES DAILY  360 tablet 1  . ibuprofen (ADVIL,MOTRIN) 400 MG tablet Take 1 tablet (400 mg total) by mouth every 6 (six) hours as needed for headache or moderate pain. (Patient not taking: Reported on 12/07/2016) 30 tablet 0    Objective: BP 126/81   Pulse 94   Temp 98.2 F (36.8 C) (Oral)   Ht 5\' 3"  (1.6 m)   Wt 300 lb (136.1 kg)   LMP 10/07/2010   SpO2 90%   BMI 53.14 kg/m  Exam: General: Obese female with moon facies, pleasant and cooperative.  Eyes: PEERLA, EOMI  ENTM: Moist mucous membranes, no lymphadenopathy.  Neck: Exam limited by body habitus  Cardiovascular: Regular rate and rhythm, no murmur  Respiratory: Clear to auscultation bilaterally, no wheezing, easy work of breathing  Gastrointestinal: Soft, mildly tender to palpation in suprapubic region, nondistended, protuberant. Positive bowel sounds  MSK: Bilateral lower extremities wrapped in [place], difficult to appreciate edema.  Derm: No rashes on exposed skin  Neuro: Alert and oriented 4, grossly intact  Psych: Pleasant mood, normal affect   Labs and Imaging: CBC BMET   Recent Labs Lab 12/07/16 1320 12/07/16 1353  WBC 11.1*  --   HGB 12.8 14.3  HCT 41.2 42.0  PLT 232  --     Recent Labs Lab 12/07/16 1353  NA 134*  K 3.7  CL 85*  BUN 18  CREATININE 0.80  GLUCOSE 533*     CT Angio  Chest PE W and/or Wo Contrast  Final Result    DG Chest 2 View  Final Result      Garth Bigness, MD 12/07/2016, 5:36 PM PGY-2, [facility] FPTS Intern pager: [phone], text pages welcome

## 2016-12-07 NOTE — ED Triage Notes (Addendum)
Pt reports waking up this morning with CBG of >600 and 535 with [facility]. Pt only complaint of "not feeling like self." pt gave herself 20 units of Novalog at 0830 and 10 units of Lantus last night. Pt refused IVF d/t trying to "get rid of fluid"

## 2016-12-07 NOTE — ED Notes (Signed)
Bed: OX73 Expected date: 12/07/16 Expected time: 12:49 PM Means of arrival: Ambulance Comments: Hyperglycemia >500

## 2016-12-07 NOTE — Progress Notes (Signed)
Per MD order patient received Novolog 12 units and will recheck CBG in 2 hrs. Will continue to monitor.

## 2016-12-07 NOTE — ED Provider Notes (Signed)
[facility]-EMERGENCY DEPT Provider Note   CSN: [id number] Arrival date & time: 12/07/16  1249     History   Chief Complaint Chief Complaint  Patient presents with  . Hyperglycemia    HPI Barbara [name] is a 61 y.o. female.  HPI 61 y.o.femalePMH significant for multiple sclerosis with spastic paraplegia, chronic prednisone use, indwelling chronic Foley, left lower extremity cellulitis, HTN, T2DM who presented with chills, lower abd pain and back pain, clogged indwelling foley catheter, weakness and elevated blood sugars.  Pt reports that her sugars have been running high for a week.  Pt also noted to be hypoxic. She is chronically bed bound now, and denies hx of PE, DVT. Pt does indicate feeling short of breath. Pt has no cough or chest pain. Pt reports that her leg wound are closed and healing well.   Past Medical History:  Diagnosis Date  . Absence of bladder continence   . 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 Cellulitis, leg 09/24/2016  . Cervical myelopathy (HCC) 08/07/2011  . Chronic gout 12/07/2010   Infrequent flairs   . Chronic indwelling Foley catheter 11/29/2016  . Chronic venous insufficiency 11/05/2012  . Current chronic use of systemic steroids 03/07/2016   No shingles vaccine  . Decubitus ulcer of sacral region   . Diabetes mellitus without complication (HCC)   . DM type 2, not at goal [facility]) 03/06/2016  . Essential hypertension   . Gout   . Hypertension   . Hypoxia   . Impaired mobility and activities of daily living 03/07/2016  . Lower back pain   . Lower extremity weakness 04/02/2016  . Lumbar spondylosis   . Morbid obesity (HCC) 12/07/2010   Has always been large.  Since MS has been trying and partially successful at wt loss.     . MS (multiple sclerosis) (HCC)   . Multiple sclerosis (HCC) 12/07/2010   Diagnosed 05/2010.  Managed by Dr. Sandria Manly   . Numbness   . Shortness of breath   . Spastic paraplegia   . Spinal stenosis   . Spinal stenosis in cervical region 06/03/2011   [facility] (Dr. Timoteo Ace) anterior discectomies and fusion at C5-6,6-7 & 7-1 done on 06/14/11.    Marland Kitchen Spinal stenosis of lumbar region 12/07/2010   Chronic low back pain.  Left leg weak but unclear if due to MS or spinal stenosis.  Pain well controled by tramadol.    Now (Jan 2013)  Managed  [facility], who plans at a later date lumbar laminectomy L2-4.   Marland Kitchen Weakness   . Weakness of both lower extremities     Patient Active Problem List   Diagnosis Date Noted  . Hypoxia 12/07/2016  . Chronic indwelling Foley catheter 11/29/2016  . Cellulitis, leg 09/24/2016  . Lower extremity weakness 04/02/2016  . Palliative care encounter 03/07/2016  . Current chronic use of systemic steroids 03/07/2016  . DM type 2, not at goal [facility]) 03/06/2016  . Weakness of both lower extremities   . Absence of bladder continence   . Essential hypertension   . Encounter for chronic pain management 04/21/2014  . Chronic venous insufficiency 11/05/2012  . Cervical myelopathy (HCC) 08/07/2011  . Spinal stenosis in cervical region 06/03/2011  . Multiple sclerosis (HCC) 12/07/2010  . 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 Spinal stenosis of lumbar region 12/07/2010  . Morbid obesity (HCC) 12/07/2010  . Chronic gout 12/07/2010    Past Surgical History:  Procedure Laterality Date  . CERVICAL DISC SURGERY  Qusetions C5-6 C6-7 and C7-T1    OB History    No data available       Home Medications    Prior to Admission medications   Medication Sig Start Date End Date Taking? Authorizing Provider  Apple Cider Vinegar 188 MG CAPS Take 188 mg by mouth 2 (two) times daily.  11/29/16  Yes McDiarmid, Leighton Roach, MD  aspirin EC 325 MG tablet Take 325 mg by mouth every evening.    Yes [provider]  Cholecalciferol (VITAMIN D3) 1000 units CAPS Take 4,000 Units by mouth every evening.    Yes [provider]  Coenzyme Q10 (COQ10) 100 MG CAPS Take 100 mg by mouth every evening.   Yes [provider]  diazepam  (VALIUM) 5 MG tablet TAKE 1 TABLET BY MOUTH EVERY 6 HOURS AS NEEDED FOR LEG SPASMS Patient taking differently: Take 5 mg by mouth every 6 (six) hours as needed for muscle spasms.  05/30/16  Yes Hensel, Santiago Bumpers, MD  furosemide (LASIX) 40 MG tablet Take 1 tablet (40 mg total) by mouth 2 (two) times daily. 11/12/16  Yes Hensel, Santiago Bumpers, MD  gabapentin (NEURONTIN) 600 MG tablet Take 1 tablet (600 mg total) by mouth 3 (three) times daily. 09/27/16  Yes Garth Bigness, MD  insulin aspart (NOVOLOG) 100 UNIT/ML FlexPen 10 units Rowena for every 100 mg/dL above fasting 370 mg/dl once in morning 4/88/89  Yes McDiarmid, Leighton Roach, MD  Insulin Glargine (LANTUS) 100 UNIT/ML Solostar Pen Inject 10 Units into the skin daily at 10 pm. Patient taking differently: Inject 13 Units into the skin at bedtime.  09/16/16  Yes Hensel, Santiago Bumpers, MD  Magnesium 250 MG TABS Take 250 mg by mouth every evening.    Yes [provider]  methocarbamol (ROBAXIN) 750 MG tablet TAKE 2 TABLETS (1,500 MG TOTAL) BY MOUTH 3 (THREE) TIMES DAILY. 12/14/15  Yes Hensel, Santiago Bumpers, MD  Multiple Vitamin (MULTIVITAMIN WITH MINERALS) TABS tablet Take 1 tablet by mouth every evening.   Yes [provider]  nitrofurantoin (MACRODANTIN) 50 MG capsule Take 1 capsule (50 mg total) by mouth 2 (two) times daily. 10/16/16  Yes Hensel, Santiago Bumpers, MD  predniSONE (DELTASONE) 20 MG tablet Take 20 mg by mouth daily with breakfast.    Yes [provider]  traMADol (ULTRAM) 50 MG tablet TAKE 1 TABLET BY MOUTH 4 TIMES DAILY 07/25/16  Yes Hensel, Santiago Bumpers, MD  ibuprofen (ADVIL,MOTRIN) 400 MG tablet Take 1 tablet (400 mg total) by mouth every 6 (six) hours as needed for headache or moderate pain. Patient not taking: Reported on 12/07/2016 04/05/16   Tillman Sers, DO    Family History Family History  Problem Relation Age of Onset  . Heart disease Mother   . Diabetes Mother   . Cancer Father     Social History Social History    Substance Use Topics  . Smoking status: Never Smoker  . Smokeless tobacco: Never Used  . Alcohol use No     Allergies   Metformin and related   Review of Systems Review of Systems  All other systems reviewed and are negative.    Physical Exam Updated Vital Signs BP 126/81   Pulse 94   Temp 98.2 F (36.8 C) (Oral)   Ht 5\' 3"  (1.6 m)   Wt 136.1 kg (300 lb)   LMP 10/07/2010   SpO2 90%   BMI 53.14 kg/m   Physical Exam  Constitutional: She is oriented to  person, place, and time. She appears well-developed and well-nourished.  Morbidly obese  HENT:  Head: Normocephalic and atraumatic.  Eyes: Pupils are equal, round, and reactive to light. EOM are normal.  Neck: Neck supple.  Cardiovascular: Normal rate, regular rhythm and normal heart sounds.   Pulmonary/Chest: Effort normal. No respiratory distress. She has no wheezes.  Abdominal: Soft. She exhibits no distension. There is no tenderness. There is no rebound and no guarding.  Musculoskeletal: She exhibits tenderness.  Neurological: She is alert and oriented to person, place, and time.  Skin: Skin is warm and dry. Rash noted.  Legs are dressed with cobain  Nursing note and vitals reviewed.    ED Treatments / Results  Labs (all labs ordered are listed, but only abnormal results are displayed) Labs Reviewed  CBC - Abnormal; Notable for the following:       Result Value   WBC 11.1 (*)    RDW 19.3 (*)    All other components within normal limits  URINALYSIS, ROUTINE W REFLEX MICROSCOPIC - Abnormal; Notable for the following:    APPearance HAZY (*)    Glucose, UA >=500 (*)    Hgb urine dipstick SMALL (*)    Ketones, ur 5 (*)    Leukocytes, UA LARGE (*)    Bacteria, UA MANY (*)    Squamous Epithelial / LPF 0-5 (*)    All other components within normal limits  BLOOD GAS, VENOUS - Abnormal; Notable for the following:    pCO2, Ven 71.1 (*)    pO2, Ven 57.4 (*)    Bicarbonate 38.1 (*)    Acid-Base Excess 9.9 (*)     All other components within normal limits  CBG MONITORING, ED - Abnormal; Notable for the following:    Glucose-Capillary 555 (*)    All other components within normal limits  I-STAT CHEM 8, ED - Abnormal; Notable for the following:    Sodium 134 (*)    Chloride 85 (*)    Glucose, Bld 533 (*)    Calcium, Ion 1.12 (*)    All other components within normal limits  URINE CULTURE  I-STAT CG4 LACTIC ACID, ED    EKG  EKG Interpretation None       Radiology Dg Chest 2 View  Result Date: 12/07/2016 CLINICAL DATA:  61 year old female with progressive hyperglycemia. Shortness of breath, hypoxia. EXAM: CHEST  2 VIEW COMPARISON:  10/27/2016 and earlier. FINDINGS: Semi upright AP and lateral views of the chest. Stable lung volumes. Stable enlarged cardiac and mediastinal contours, seen by chest CTA on 07/20/2016 to be primarily due to mediastinal lipomatosis. Decreased pulmonary vascularity compared to 10/27/2016, with no acute edema suspected today. No pneumothorax, pleural effusion or consolidation. Prior cervical ACDF. IMPRESSION: 1. No acute cardiopulmonary abnormality. Regressed pulmonary vascularity compared to 10/27/2016. 2. Mediastinal lipomatosis. Electronically Signed   By: Odessa Fleming M.D.   On: 12/07/2016 14:09   Ct Angio Chest Pe W And/or Wo Contrast  Result Date: 12/07/2016 CLINICAL DATA:  Shortness of breath.  Suspected pulmonary embolism. EXAM: CT ANGIOGRAPHY CHEST WITH CONTRAST TECHNIQUE: Multidetector CT imaging of the chest was performed using the standard protocol during bolus administration of intravenous contrast. Multiplanar CT image reconstructions and MIPs were obtained to evaluate the vascular anatomy. CONTRAST:  100 cc Isovue 370 intravenous COMPARISON:  07/20/2016 FINDINGS: Cardiovascular: Pulmonary artery opacification is suboptimal due to bolus dispersion and patient size. There is reasonable visualization to the segmental level with no evidence of pulmonary embolism.  Dilated  main pulmonary artery at 43 mm, compatible with pulmonary hypertension. No acute aortic finding. Mild calcification at the aortic valve level. No notable atherosclerotic calcification. Mild thinning cardiomegaly. Mediastinum/Nodes: Mediastinal lipomatosis. Negative for adenopathy or mass. Lungs/Pleura: Low volume chest. There is asymmetric ground-glass opacity in the left lower lobe and right perihilar lung. Atelectatic type opacities that are subsegmental. Mosaic attenuation lungs which could be from small airway or small vessel disease. Upper Abdomen: No acute finding. Musculoskeletal: No acute or aggressive finding. Prominent spondylosis. Review of the MIP images confirms the above findings. IMPRESSION: 1. No evidence of pulmonary embolism. Sensitivity is diminished by patient body habitus. 2. Asymmetric airspace disease in the left lower lobe primarily suspicious for pneumonia. Right perihilar ground-glass opacity could also be infectious. 3. Enlarged pulmonary arteries as seen with pulmonary hypertension. 4. Mild atelectasis. Electronically Signed   By: Marnee Spring M.D.   On: 12/07/2016 15:30    Procedures Procedures (including critical care time)  Medications Ordered in ED Medications  insulin aspart protamine- aspart (NOVOLOG MIX 70/30) injection 10 Units (not administered)  iopamidol (ISOVUE-370) 76 % injection (100 mLs  Contrast Given 12/07/16 1453)  cefTRIAXone (ROCEPHIN) 1 g in dextrose 5 % 50 mL IVPB (0 g Intravenous Stopped 12/07/16 1635)     Initial Impression / Assessment and Plan / ED Course  I have reviewed the triage vital signs and the nursing notes.  Pertinent labs & imaging results that were available during my care of the patient were reviewed by me and considered in my medical decision making (see chart for details).     Pt comes in with cc of vague complains  - elevated blood sugar, weakness, back pain, urinary retention.  Foley was declogged. Clean urine  sample sent for eval - and there is signs of infection. Pt has weakness, back pain and she is immunosuppressed - so we will tx her with ceftriaxone. Previous cultures reviewed.  Hypoxia - not sure what the cause is. CT PE is neg.  - there is pulm HTN per CT.  Final Clinical Impressions(s) / ED Diagnoses   Final diagnoses:  Acute cystitis without hematuria  Hypoxia  Morbid obesity (HCC)    New Prescriptions New Prescriptions   No medications on file     Derwood Kaplan, MD 12/07/16 1732

## 2016-12-07 NOTE — Progress Notes (Signed)
CBG=525. MD informed. Will continue ot monitor.

## 2016-12-07 NOTE — Progress Notes (Addendum)
Pharmacy Antibiotic Note  Barbara [name] is a 61 y.o. female admitted on 12/07/2016 with pneumonia and UTI.  Pharmacy has been consulted for vancomycin dosing.patient with history of spastic paraplegia and is bedbound. She presents with hyperglycemia.  Also c/o of chills, abd and back pain, clogged foley and weakness.  She was given a dose of ceftriaxone in ED and pharmacy asked to dose vancomycin for pneumonia.  Hypoxia noted in ED but she does not report a cough. She was admitted in June for sepsis due to UTI and urine cx grew >100K proteus (R to cipro and nitrofurantoin) and 50K pseudomonas (pan-susc).   Today, 12/07/2016  SCr WNL but likely overestimates CrCl d/t bedbound condition  WBC sl elevated  afebrile  Plan:  Vancomycin 2gm x 1 now then 1gm IV q12h based on normalized CrCl and rounding SCr to 1mg /mL  Check daily SCr  Trough at steady state (goal trough 15-20 mcg/mL)  Suggest adding empiric pseudomonas coverage due to recent pseudomonas UTI (ex. Cefepime 2gm q8h)  Daily SCr due to risk of nephrotoxicity  Consider checking MRSA PCR to assist with abx de-escalation  Height: 5\' 3"  (160 cm) Weight: 300 lb (136.1 kg) IBW/kg (Calculated) : 52.4  Temp (24hrs), Avg:98.2 F (36.8 C), Min:98.2 F (36.8 C), Max:98.2 F (36.8 C)   Recent Labs Lab 12/07/16 1320 12/07/16 1353 12/07/16 1354  WBC 11.1*  --   --   CREATININE  --  0.80  --   LATICACIDVEN  --   --  1.75    Estimated Creatinine Clearance: 101.4 mL/min (by C-G formula based on SCr of 0.8 mg/dL).    Allergies  Allergen Reactions  . Metformin And Related Diarrhea and Nausea And Vomiting    Antimicrobials this admission: 8/4 ceftriaxone x 1 8/4 vancomycin >>  Dose adjustments this admission:  Microbiology results: 8/4 Bcx 8/4 Ucx (dose of ceftriaxone given prior to collection)  Thank you for allowing pharmacy to be a part of this patient's care.  Juliette Alcide, PharmD, BCPS.   Pager:  [phone] 12/07/2016 6:26 PM

## 2016-12-07 NOTE — Progress Notes (Signed)
Patient trasfered from [facility] to [phone] via [facility]; alert and oriented x 4; no complaints of pain; IV saline locked in LFA. Orient patient to room and unit; gave patient care guide; MD notified; instructed how to use the call bell and  fall risk precautions. Will continue to monitor the patient.

## 2016-12-07 NOTE — ED Notes (Addendum)
Pt foley irrigated at this time with 2 long noodle like obstructions removed from catheter. Bladder scan .

## 2016-12-08 ENCOUNTER — Inpatient Hospital Stay (HOSPITAL_COMMUNITY): Payer: PPO

## 2016-12-08 DIAGNOSIS — E119 Type 2 diabetes mellitus without complications: Secondary | ICD-10-CM

## 2016-12-08 DIAGNOSIS — R319 Hematuria, unspecified: Secondary | ICD-10-CM

## 2016-12-08 DIAGNOSIS — T83510A Infection and inflammatory reaction due to cystostomy catheter, initial encounter: Secondary | ICD-10-CM | POA: Diagnosis present

## 2016-12-08 DIAGNOSIS — T83098A Other mechanical complication of other indwelling urethral catheter, initial encounter: Secondary | ICD-10-CM | POA: Diagnosis present

## 2016-12-08 DIAGNOSIS — J189 Pneumonia, unspecified organism: Secondary | ICD-10-CM

## 2016-12-08 DIAGNOSIS — Z9289 Personal history of other medical treatment: Secondary | ICD-10-CM

## 2016-12-08 DIAGNOSIS — T3695XA Adverse effect of unspecified systemic antibiotic, initial encounter: Secondary | ICD-10-CM | POA: Diagnosis not present

## 2016-12-08 DIAGNOSIS — R310 Gross hematuria: Secondary | ICD-10-CM

## 2016-12-08 LAB — CBC
HCT: 39.4 % (ref 36.0–46.0)
HEMATOCRIT: 38.5 % (ref 36.0–46.0)
Hemoglobin: 12 g/dL (ref 12.0–15.0)
Hemoglobin: 11.5 g/dL — ABNORMAL LOW (ref 12.0–15.0)
MCH: 26.4 pg (ref 26.0–34.0)
MCH: 25.7 pg — ABNORMAL LOW (ref 26.0–34.0)
MCHC: 30.5 g/dL (ref 30.0–36.0)
MCHC: 29.9 g/dL — ABNORMAL LOW (ref 30.0–36.0)
MCV: 86.8 fL (ref 78.0–100.0)
MCV: 85.9 fL (ref 78.0–100.0)
PLATELETS: 215 10*3/uL (ref 150–400)
Platelets: 194 10*3/uL (ref 150–400)
RBC: 4.54 MIL/uL (ref 3.87–5.11)
RBC: 4.48 MIL/uL (ref 3.87–5.11)
RDW: 19.5 % — ABNORMAL HIGH (ref 11.5–15.5)
RDW: 19.5 % — AB (ref 11.5–15.5)
WBC: 13.1 10*3/uL — AB (ref 4.0–10.5)
WBC: 9.6 10*3/uL (ref 4.0–10.5)

## 2016-12-08 LAB — GLUCOSE, CAPILLARY
GLUCOSE-CAPILLARY: 455 mg/dL — AB (ref 65–99)
GLUCOSE-CAPILLARY: 438 mg/dL — AB (ref 65–99)
Glucose-Capillary: 394 mg/dL — ABNORMAL HIGH (ref 65–99)
Glucose-Capillary: 402 mg/dL — ABNORMAL HIGH (ref 65–99)
Glucose-Capillary: 483 mg/dL — ABNORMAL HIGH (ref 65–99)
Glucose-Capillary: 373 mg/dL — ABNORMAL HIGH (ref 65–99)

## 2016-12-08 LAB — BLOOD GAS, ARTERIAL
Acid-Base Excess: 12.8 mmol/L — ABNORMAL HIGH (ref 0.0–2.0)
BICARBONATE: 38.5 mmol/L — AB (ref 20.0–28.0)
DRAWN BY: 213381
O2 CONTENT: 3 L/min
O2 SAT: 95.2 %
PATIENT TEMPERATURE: 98.6
PO2 ART: 77.8 mmHg — AB (ref 83.0–108.0)
pCO2 arterial: 67.7 mmHg (ref 32.0–48.0)
pH, Arterial: 7.373 (ref 7.350–7.450)

## 2016-12-08 LAB — BASIC METABOLIC PANEL
ANION GAP: 11 (ref 5–15)
Anion gap: 9 (ref 5–15)
BUN: 15 mg/dL (ref 6–20)
BUN: 16 mg/dL (ref 6–20)
CALCIUM: 8.7 mg/dL — AB (ref 8.9–10.3)
CHLORIDE: 89 mmol/L — AB (ref 101–111)
CO2: 36 mmol/L — ABNORMAL HIGH (ref 22–32)
CO2: 37 mmol/L — ABNORMAL HIGH (ref 22–32)
CREATININE: 0.94 mg/dL (ref 0.44–1.00)
Calcium: 9.1 mg/dL (ref 8.9–10.3)
Chloride: 88 mmol/L — ABNORMAL LOW (ref 101–111)
Creatinine, Ser: 0.87 mg/dL (ref 0.44–1.00)
GFR calc Af Amer: >60 mL/min (ref >60)
GLUCOSE: 453 mg/dL — AB (ref 65–99)
Glucose, Bld: 425 mg/dL — ABNORMAL HIGH (ref 65–99)
POTASSIUM: 3.1 mmol/L — AB (ref 3.5–5.1)
POTASSIUM: 3.5 mmol/L (ref 3.5–5.1)
SODIUM: 134 mmol/L — AB (ref 135–145)
SODIUM: 136 mmol/L (ref 135–145)

## 2016-12-08 LAB — LACTIC ACID, PLASMA
Lactic Acid, Venous: 1.7 mmol/L (ref 0.5–1.9)
Lactic Acid, Venous: 1.3 mmol/L (ref 0.5–1.9)

## 2016-12-08 MED ORDER — POTASSIUM CHLORIDE CRYS ER 20 MEQ PO TBCR
40.0000 meq | EXTENDED_RELEASE_TABLET | ORAL | Status: AC
  Administered 2016-12-08 (×2): 40 meq via ORAL
  Filled 2016-12-08 (×2): qty 2

## 2016-12-08 MED ORDER — DIPHENHYDRAMINE HCL 50 MG/ML IJ SOLN
INTRAMUSCULAR | Status: AC
  Filled 2016-12-08: qty 1

## 2016-12-08 MED ORDER — FAMOTIDINE IN NACL 20-0.9 MG/50ML-% IV SOLN
20.0000 mg | Freq: Once | INTRAVENOUS | Status: AC
  Administered 2016-12-08: 20 mg via INTRAVENOUS
  Filled 2016-12-08: qty 50

## 2016-12-08 MED ORDER — PNEUMOCOCCAL VAC POLYVALENT 25 MCG/0.5ML IJ INJ
0.5000 mL | INJECTION | INTRAMUSCULAR | Status: DC
  Filled 2016-12-08 (×2): qty 0.5

## 2016-12-08 MED ORDER — INSULIN ASPART 100 UNIT/ML ~~LOC~~ SOLN
9.0000 [IU] | Freq: Once | SUBCUTANEOUS | Status: AC
  Administered 2016-12-08: 9 [IU] via SUBCUTANEOUS

## 2016-12-08 MED ORDER — INSULIN ASPART 100 UNIT/ML ~~LOC~~ SOLN
0.0000 [IU] | Freq: Every day | SUBCUTANEOUS | Status: DC
  Administered 2016-12-08 – 2016-12-09 (×2): 5 [IU] via SUBCUTANEOUS
  Administered 2016-12-10: 4 [IU] via SUBCUTANEOUS
  Administered 2016-12-11 – 2016-12-14 (×3): 2 [IU] via SUBCUTANEOUS
  Administered 2016-12-15: 3 [IU] via SUBCUTANEOUS

## 2016-12-08 MED ORDER — INSULIN ASPART 100 UNIT/ML ~~LOC~~ SOLN
5.0000 [IU] | Freq: Once | SUBCUTANEOUS | Status: AC
  Administered 2016-12-08: 5 [IU] via SUBCUTANEOUS

## 2016-12-08 MED ORDER — INSULIN GLARGINE 100 UNIT/ML ~~LOC~~ SOLN
15.0000 [IU] | Freq: Every day | SUBCUTANEOUS | Status: DC
  Administered 2016-12-08: 15 [IU] via SUBCUTANEOUS
  Filled 2016-12-08: qty 0.15

## 2016-12-08 MED ORDER — LORATADINE 10 MG PO TABS
10.0000 mg | ORAL_TABLET | Freq: Every day | ORAL | Status: AC
  Administered 2016-12-08 – 2016-12-12 (×4): 10 mg via ORAL
  Filled 2016-12-08 (×4): qty 1

## 2016-12-08 MED ORDER — INSULIN ASPART 100 UNIT/ML ~~LOC~~ SOLN
0.0000 [IU] | Freq: Three times a day (TID) | SUBCUTANEOUS | Status: DC
  Administered 2016-12-08: 9 [IU] via SUBCUTANEOUS

## 2016-12-08 MED ORDER — DIPHENHYDRAMINE HCL 50 MG/ML IJ SOLN
25.0000 mg | Freq: Once | INTRAMUSCULAR | Status: AC
  Administered 2016-12-08: 25 mg via INTRAVENOUS

## 2016-12-08 MED ORDER — METHOCARBAMOL 500 MG PO TABS
750.0000 mg | ORAL_TABLET | Freq: Three times a day (TID) | ORAL | Status: DC
  Administered 2016-12-08 – 2016-12-09 (×2): 750 mg via ORAL
  Filled 2016-12-08: qty 2
  Filled 2016-12-08: qty 1
  Filled 2016-12-08: qty 2

## 2016-12-08 MED ORDER — INSULIN ASPART 100 UNIT/ML ~~LOC~~ SOLN
0.0000 [IU] | Freq: Three times a day (TID) | SUBCUTANEOUS | Status: DC
  Administered 2016-12-08 – 2016-12-09 (×4): 15 [IU] via SUBCUTANEOUS

## 2016-12-08 MED ORDER — DIAZEPAM 5 MG PO TABS: 5.0000 mg | ORAL_TABLET | Freq: Two times a day (BID) | ORAL | Status: DC | PRN

## 2016-12-08 NOTE — Progress Notes (Signed)
CRITICAL VALUE ALERT  Critical Value:  Ph=7.327; CO2=67.7; PO2=77.8  Date & Time Notied:  12/08/2016; 17:01  Provider Notified: Family Medicine  Orders Received/Actions taken: decrease oxygen to maintain satO2 at 92%.

## 2016-12-08 NOTE — Progress Notes (Signed)
Patient very sleepy and disoriented, more confused; she had hard time taking her meds. Sat O2 was 93 on 1L nasal canula. MD was informed; also rapid response was called. Will continue to monitor.

## 2016-12-08 NOTE — Progress Notes (Signed)
FPTS Interim Progress Note  S:Pt complains of feeling "confused" all day. She denies cp, ha, sob, dizziness. She also endorses right sided numbness and her left side feeling "weird". Sister says she is not like this at home.   O: BP 129/72 (BP Location: Right Arm)   Pulse 95   Temp 98.6 F (37 C) (Oral)   Resp 18   Ht 5\' 3"  (1.6 m)   Wt (!) 327 lb 9.6 oz (148.6 kg)   LMP 10/07/2010   SpO2 93%   BMI 58.03 kg/m    PE:  General: NAD,Patient is slow to respond to question HEENT: EOMI, PERRLA, CVS: n1 s1/s2, no mrg Lungs: CTAB, no increased work of breathing MSK: 3/5 bilateral upper extremity strength Neuro: AxO to person, place, date, president. CNII-XII intact. Minor right-sided pronator drift. Sensation to light touch present in both upper extremities, intact grip strength, unable to perform rapid alternating because she said she "didn't know how", 3/5   A/P: Confusion with right-sided numbness: VSS. Dx: Medication sedation vs. CVA. Patient is on several potentially sedating medications. Likely caused by recently one time dose of diphenhydramine.  Pt is also exibiting some focal neurologic findings on her right.  - change home diazepam to prn,  - abg given previous elevated CO2,  - bmp to observe for possible dka - cbc to investigate possible  - if neurologic symptoms persist, CT head     Garnette Gunner, MD 12/08/2016, 4:12 PM PGY-1, [facility] Family Medicine Service pager [phone]

## 2016-12-08 NOTE — Progress Notes (Signed)
CBG=483. MD was informed and per his verbal order patient will receive 15 units Novolog per sliding scale and additional 5 units Novolog. Will continue to monitor.

## 2016-12-08 NOTE — Progress Notes (Signed)
Patient requested to speak with her doctor. She verbalized that she is not feeling well, she was sweating and confused at the times. VS stable, CBG 438. MD notified.

## 2016-12-08 NOTE — Progress Notes (Signed)
FPTS Interim Progress Note  S: went to see patient in response to her nurses concern about her somnolence. Patient is sleepy but easily arises when addressed by name and responds to questions appropriately. She is oriented x4. She denies chest pain, shortness of breath or any discomfort.   O: BP 119/75 (BP Location: Right Arm)   Pulse 88   Temp 98.1 F (36.7 C) (Oral)   Resp 19   Ht 5\' 3"  (1.6 m)   Wt (!) 327 lb 9.6 oz (148.6 kg)   LMP 10/07/2010   SpO2 97%   BMI 58.03 kg/m   Gen: sleepy but arises easily Resp: on 1L by [place], no IWOB, lungs clear but limited exam due to body habitus CVR: RRR Neuro: sleepy but arises easily, oriented x4, moves all extremities bilaterally  A/P: Patient with somnolence but arousable to voice. Somnolence likely due to multiple sedating medication and hypercarbia. ABG suggestive for chronic CO2 retention.  -Spaced out her chronic pain and sedating meds. Also changed to PRN -CXR stat -Repeat ABG -Use minimum oxygen to keep sat over 92% -BMP -CBC -Patient is DNR.  May consider BiPAP but cautiously with AMS.  -Monitor closely  Almon Hercules, MD 12/08/2016, 10:46 PM PGY-3, [facility] Family Medicine Service pager [phone]

## 2016-12-08 NOTE — Progress Notes (Signed)
CBG=402. Patient received 9 units Novolog per sliding scale and MD was informed. No new orders at this time. Will continue to monitor.

## 2016-12-08 NOTE — Progress Notes (Signed)
At this time patient is more alert and she was able to talk with MD and rapid response nurse. VS stable. Sat O2 is 92% at 1 L/min Nasal canula. STAT CXR was ordered and per MD he will order to repeat ABG. Will continue to monitor.

## 2016-12-08 NOTE — Progress Notes (Signed)
Family Medicine Teaching Service Daily Progress Note Intern Pager: [phone]  Patient name: Makaiya Barraco Medical record number: [id number] Date of birth: February 12, 1956 Age: 61 y.o. Gender: female  Primary Care Provider: Moses Manners, MD Consultants: none Code Status: DNR/DNI  Pt Overview and Major Events to Date:  8/4 admitted with UTI and CAP  Assessment and Plan: Vee Solan is a 61 y.o. 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 PMH is significant for multiple sclerosis with spastic paraplegia, chronic prednisone use, indwelling chronic Foley, left lower extremity cellulitis, HTN, T2DM.   UTI: Urine culture pending. Lactate obtained overnight due to increased O2 requirement and fever normal at 1.7. Doubt septic picture. Continue current antibiotics and await culture for narrowing. Will need resumption of antibiotic prophylaxis for UTI when this acute course is completed. -Continue ceftriaxone -Monitor urine output -consider vaginal estrogen if persistent pain around foley site -follow urine culture   Hypoxia: Will treat as pneumonia, consider discussion of modification of diet with patient prior to ordering speech therapy evaluation. See goals of care below. Currently weaned to 3 L nasal cannula, suspect obesity hypoventilation syndrome as a portion of the etiology of her hypoxia, this is been off and on over the past year. Given that she is immobile, and unlikely to trip over it, home oxygen may be a reasonable consideration for her to avoid future admissions. -azithromycin 500 mg daily -Ceftriaxone as above -Consider home oxygen -Wean nasal cannula as tolerated -Discuss goals of care prior to ordering speech evaluation  Multiple Sclerosis with spastic paraplegia on chronic steroids: Currently taking 20mg  prednisone at home. Plan for very slow taper of prednisone made at recent home visit, refer to Dr. Deland Pretty note on  7/26, to decrease to 15 mg on 8/13. Has been treated with stress dose steroids in the past, did not give in this instance due to vital sign stability, no lactic acidosis, no signs of sepsis and given concomitant elevated CBGs. - Continue home Valium, gabapentin, Robaxin, Tramadol - Given clinically well appearance, no need for stress steroid dosing at present  HTN: Currently normotensive. On Lasix 40 mg twice a day at home.  - Continue home Lasix  Hx of adrenal insufficiency: See plan for multiple sclerosis above. Continue home 20 mg prednisone. - Continue prednisone  T2DM:Insulin regimen prior to arrival unclear, CBGs poorly controlled when home visit was conducted on 7/26. Unclear if this poor control is due to infection versus confusion about insulin regimen. Hypoglycemia at present, but no signs of acidosis. Will continue to chase blood sugars with short acting NovoLog. - Decrease Lantus to 10U qhs during admission - sSSI - CBG qACHS - Clarify insulin regimen with PCP prior to discharge  Goals of care: Patient has a long-standing relationship with PCP, [name]. Long discussion of goals of care at last admission Included completing and most form. At this time, patient is DO NOT RESUSCITATE/DO NOT INTUBATE, does not desire ICU admission, does not desire dialysis, does not desire feeding tube. She is amenable to short-term hospitalizations for IV fluids and antibiotics. -She notes that she is comfortable going home with oxygen if necessary -Continue to reevaluate goals of care  FEN/GI: carb mod diet Prophylaxis: lovenox  Disposition: home pending urine culture and narrowing antibiotics  Subjective:  Patient is sleepy after being up most of the night due to previously noted fever and erythema. Erythema improved. Decreased pain. Improved SOB.   Objective: Temp:  [97.7 F (36.5 C)-101 F (  38.3 C)] 98.7 F (37.1 C) (08/05 0644) Pulse Rate:  [81-108] 91 (08/05 0644) Resp:   [12-20] 20 (08/05 0644) BP: (101-126)/(54-81) 101/54 (08/05 0644) SpO2:  [89 %-98 %] 98 % (08/05 0644) Weight:  [300 lb (136.1 kg)-327 lb 9.6 oz (148.6 kg)] 327 lb 9.6 oz (148.6 kg) (08/04 1924) Physical Exam: General: Obese female with moon facies, pleasant and cooperative in NAD with [place] in place. Cardiovascular: RRR, no murmur Respiratory: CTAB, easy WOB Abdomen: soft, protuberant, TTP suprapubic region and over insulin injection sites Extremities: Bilateral lower extremities wrapped in Coban, edema at baseline (chronic)  Laboratory:  Recent Labs Lab 12/07/16 1320 12/07/16 1353 12/08/16 0351  WBC 11.1*  --  13.1*  HGB 12.8 14.3 12.0  HCT 41.2 42.0 39.4  PLT 232  --  215    Recent Labs Lab 12/07/16 1353 12/08/16 0351  NA 134* 134*  K 3.7 3.1*  CL 85* 89*  CO2  --  36*  BUN 18 15  CREATININE 0.80 0.94  CALCIUM  --  9.1  GLUCOSE 533* 425*   Lactate 1.7  Imaging/Diagnostic Tests: CXR with pneumonia in LLL, no excess fluid  Garth Bigness, MD 12/08/2016, 7:07 AM PGY-2, [place] [facility] FPTS Intern pager: [phone], text pages welcome

## 2016-12-08 NOTE — Progress Notes (Signed)
Called by bedside RN to assess patient for fever, redness, and increased work of breathing requiring more oxygen.  FMTS MDs were at the bedside with me.  Patient was exhibiting erythema to the face, generalized body, and all extremities, skin was warm to touch in all extremities, + pulses. Patient is protecting her airway and is not in respiratory distress  Heart/lung sounds were normal and patient was alert, slightly delayed in answering questions and mild confusion. Patient was admitted for UTI/sepsis picture, treated with antibiotics (including Vancomycin --> Red Man Syndrome >?)  VSS SBP in 100-110s, MAP > 60, Sp02 was > 93% on 3L (was on 6L on arrival, weaned down).  Interventions: - APAP for fever - cold towels/packs to extremities for comfort - Labs ordered, lactic 1.7, CBC normal.  - CXR - L Lung base opacity --> concerning for PNA, mild vascular congestion. - IV Benadryl given - IV Pepcid ordered - 2nd PIV started  Of note, patient's has been followed by FMTS Attending and Residents and per patient's request, patient is a DNR and given her history we will help medically treat as we can.   Will follow as needed.   End Time 0425

## 2016-12-08 NOTE — Progress Notes (Signed)
CBG=455. Patient will receive 15 units Novolog per sliding scale and MD was informed. No new orders at this time. Will continue to monitor.

## 2016-12-08 NOTE — Progress Notes (Signed)
Called by patient's nurse about new onset increase in warmth, redness, fever of 101F, and increased oxygen requirement from 1L to 6L [place].  Went to see patient and noted erythematous face and extremities.  Patient felt acutely uncomfortable but denied pain.  Called Dr. Chanetta Marshall, and she and rapid response nurse Puja joined me in assessing the patient.  Patient confirmed that her only known allergy is metformin.  Ice packs and cold washcloths were applied to patient, and she was given 25 mg IV benadryl.  Obtained CXR and lactic acid.  Ordered IV pepcid.  Patient was able to be weaned to 3L O2.  Some confusion of the patient was noted, but she was oriented x 3.  Erythema began to resolve on extremities, although face remained erythematous.  Patient drowsy in response to benadryl.  Working diagnosis includes red man syndrome d/t previous administration of vancomcyin, new-onset sepsis, or other allergic reaction.  Will continue to monitor closely and trend lactic acid.

## 2016-12-08 NOTE — Progress Notes (Signed)
Called to bedside for concern by Edgardo Roys, RN that pt was becoming more lethargic.  Pt arousable to voice, answering all questions appropriately, denies SOB or discomfort.  Lungs were clear bilaterally with diminished bases.  VSS. SpO2 92% on 1L [place].  Dr. Candiss Norse with family med at bedside, he ordered [facility] and repeat ABG.  No orders placed by RRT.  Pt placed on our radar.

## 2016-12-09 ENCOUNTER — Inpatient Hospital Stay (HOSPITAL_COMMUNITY): Payer: PPO

## 2016-12-09 DIAGNOSIS — A419 Sepsis, unspecified organism: Secondary | ICD-10-CM

## 2016-12-09 DIAGNOSIS — J9621 Acute and chronic respiratory failure with hypoxia: Secondary | ICD-10-CM

## 2016-12-09 DIAGNOSIS — N390 Urinary tract infection, site not specified: Secondary | ICD-10-CM

## 2016-12-09 DIAGNOSIS — R400 Somnolence: Secondary | ICD-10-CM

## 2016-12-09 DIAGNOSIS — T83510D Infection and inflammatory reaction due to cystostomy catheter, subsequent encounter: Secondary | ICD-10-CM

## 2016-12-09 DIAGNOSIS — J9620 Acute and chronic respiratory failure, unspecified whether with hypoxia or hypercapnia: Secondary | ICD-10-CM | POA: Diagnosis not present

## 2016-12-09 DIAGNOSIS — T83511D Infection and inflammatory reaction due to indwelling urethral catheter, subsequent encounter: Secondary | ICD-10-CM

## 2016-12-09 DIAGNOSIS — J9622 Acute and chronic respiratory failure with hypercapnia: Secondary | ICD-10-CM

## 2016-12-09 LAB — BLOOD GAS, ARTERIAL
Acid-Base Excess: 10.8 mmol/L — ABNORMAL HIGH (ref 0.0–2.0)
Bicarbonate: 35.7 mmol/L — ABNORMAL HIGH (ref 20.0–28.0)
Drawn by: 398991
O2 Content: 2 L/min
O2 SAT: 91.2 %
PCO2 ART: 57.8 mmHg — AB (ref 32.0–48.0)
PH ART: 7.412 (ref 7.350–7.450)
Patient temperature: 100.2
pO2, Arterial: 65.8 mmHg — ABNORMAL LOW (ref 83.0–108.0)

## 2016-12-09 LAB — CBC
HEMATOCRIT: 41 % (ref 36.0–46.0)
HEMOGLOBIN: 12.4 g/dL (ref 12.0–15.0)
MCH: 26.1 pg (ref 26.0–34.0)
MCHC: 30.2 g/dL (ref 30.0–36.0)
MCV: 86.1 fL (ref 78.0–100.0)
Platelets: 171 10*3/uL (ref 150–400)
RBC: 4.76 MIL/uL (ref 3.87–5.11)
RDW: 19.1 % — ABNORMAL HIGH (ref 11.5–15.5)
WBC: 8.1 10*3/uL (ref 4.0–10.5)

## 2016-12-09 LAB — LACTIC ACID, PLASMA: Lactic Acid, Venous: 2.3 mmol/L (ref 0.5–1.9)

## 2016-12-09 LAB — MRSA PCR SCREENING: MRSA BY PCR: NEGATIVE

## 2016-12-09 LAB — URINE CULTURE

## 2016-12-09 LAB — BASIC METABOLIC PANEL
ANION GAP: 11 (ref 5–15)
BUN: 15 mg/dL (ref 6–20)
CO2: 36 mmol/L — AB (ref 22–32)
Calcium: 9 mg/dL (ref 8.9–10.3)
Chloride: 90 mmol/L — ABNORMAL LOW (ref 101–111)
Creatinine, Ser: 1.06 mg/dL — ABNORMAL HIGH (ref 0.44–1.00)
GFR calc Af Amer: >60 mL/min (ref >60)
GFR, EST NON AFRICAN AMERICAN: 56 mL/min — AB (ref >60)
GLUCOSE: 307 mg/dL — AB (ref 65–99)
POTASSIUM: 3.6 mmol/L (ref 3.5–5.1)
Sodium: 137 mmol/L (ref 135–145)

## 2016-12-09 LAB — GLUCOSE, CAPILLARY
GLUCOSE-CAPILLARY: 330 mg/dL — AB (ref 65–99)
GLUCOSE-CAPILLARY: 358 mg/dL — AB (ref 65–99)
GLUCOSE-CAPILLARY: 372 mg/dL — AB (ref 65–99)
Glucose-Capillary: 387 mg/dL — ABNORMAL HIGH (ref 65–99)
Glucose-Capillary: 370 mg/dL — ABNORMAL HIGH (ref 65–99)
Glucose-Capillary: 377 mg/dL — ABNORMAL HIGH (ref 65–99)

## 2016-12-09 MED ORDER — PRO-STAT SUGAR FREE PO LIQD
30.0000 mL | Freq: Three times a day (TID) | ORAL | Status: DC
  Administered 2016-12-10 (×2): 30 mL via ORAL
  Filled 2016-12-09 (×4): qty 30

## 2016-12-09 MED ORDER — LINEZOLID 600 MG/300ML IV SOLN
600.0000 mg | Freq: Two times a day (BID) | INTRAVENOUS | Status: DC
  Administered 2016-12-09 – 2016-12-14 (×9): 600 mg via INTRAVENOUS
  Filled 2016-12-09 (×12): qty 300

## 2016-12-09 MED ORDER — ADULT MULTIVITAMIN W/MINERALS CH
1.0000 | ORAL_TABLET | Freq: Every day | ORAL | Status: DC
  Administered 2016-12-10 – 2016-12-16 (×7): 1 via ORAL
  Filled 2016-12-09 (×7): qty 1

## 2016-12-09 MED ORDER — LINEZOLID 600 MG/300ML IV SOLN
600.0000 mg | Freq: Two times a day (BID) | INTRAVENOUS | Status: DC
  Administered 2016-12-09: 600 mg via INTRAVENOUS
  Filled 2016-12-09: qty 300

## 2016-12-09 MED ORDER — INSULIN ASPART 100 UNIT/ML ~~LOC~~ SOLN
0.0000 [IU] | Freq: Three times a day (TID) | SUBCUTANEOUS | Status: DC
  Administered 2016-12-09: 20 [IU] via SUBCUTANEOUS
  Administered 2016-12-10: 15 [IU] via SUBCUTANEOUS
  Administered 2016-12-10 (×2): 11 [IU] via SUBCUTANEOUS
  Administered 2016-12-11: 7 [IU] via SUBCUTANEOUS
  Administered 2016-12-11 (×2): 4 [IU] via SUBCUTANEOUS
  Administered 2016-12-12: 11 [IU] via SUBCUTANEOUS
  Administered 2016-12-12 – 2016-12-13 (×2): 4 [IU] via SUBCUTANEOUS
  Administered 2016-12-13 – 2016-12-14 (×2): 7 [IU] via SUBCUTANEOUS
  Administered 2016-12-14: 3 [IU] via SUBCUTANEOUS
  Administered 2016-12-14 – 2016-12-15 (×2): 11 [IU] via SUBCUTANEOUS
  Administered 2016-12-16: 7 [IU] via SUBCUTANEOUS
  Administered 2016-12-16: 4 [IU] via SUBCUTANEOUS

## 2016-12-09 MED ORDER — METHOCARBAMOL 500 MG PO TABS
250.0000 mg | ORAL_TABLET | Freq: Three times a day (TID) | ORAL | Status: DC | PRN
  Administered 2016-12-10 – 2016-12-16 (×14): 250 mg via ORAL
  Filled 2016-12-09 (×15): qty 1

## 2016-12-09 MED ORDER — SODIUM CHLORIDE 0.9 % IV BOLUS (SEPSIS)
500.0000 mL | Freq: Once | INTRAVENOUS | Status: AC
  Administered 2016-12-09: 500 mL via INTRAVENOUS

## 2016-12-09 MED ORDER — PIPERACILLIN-TAZOBACTAM 3.375 G IVPB
3.3750 g | Freq: Three times a day (TID) | INTRAVENOUS | Status: DC
  Administered 2016-12-09 – 2016-12-14 (×16): 3.375 g via INTRAVENOUS
  Filled 2016-12-09 (×19): qty 50

## 2016-12-09 MED ORDER — INSULIN GLARGINE 100 UNIT/ML ~~LOC~~ SOLN
20.0000 [IU] | Freq: Every day | SUBCUTANEOUS | Status: DC
  Administered 2016-12-09: 20 [IU] via SUBCUTANEOUS
  Filled 2016-12-09: qty 0.2

## 2016-12-09 MED ORDER — HYDROCORTISONE NA SUCCINATE PF 100 MG IJ SOLR
100.0000 mg | Freq: Every day | INTRAMUSCULAR | Status: AC
  Administered 2016-12-09 – 2016-12-10 (×2): 100 mg via INTRAVENOUS
  Filled 2016-12-09 (×2): qty 2

## 2016-12-09 NOTE — Progress Notes (Signed)
I am PCP and I stopped by to make a social visit.  She was quite sleepy/somnulent, so I couldn't do much socializing.  I will visit again tomorrow and hope she is more alert.  Appreciate great care of team.  Just a few highlights from my perspective: 1. Goals of care.  See care coordination note.  Until now her priority is to remain at home, despite the risks.  She wants reversible things treated.  She does not want prolonged mechanical support.  She has a strong faith and is not fearful of death. 2. Dispo: With previous admits, she has wanted to go home.  At my last visit, she mentioned that if/when she was hospitalized again, it would be a sign to her that she needed SNF.  We shall see as she wakes up if she is ready to make that switch. 3. Prior to this hospitalization, I have not fully investigated pulmonary issues.  Previous infections have been urinary due to indwelling foley.  I don't know about Sleep Apnea, Obesity hypovent syndrome, decreased ventilation secondary to morbid obesity and/or her multiple sclerosis, or aspiration secondary to MS.  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  If she remains on the path of aggressively treating infections, we may want to further investigate these possibilities.

## 2016-12-09 NOTE — Progress Notes (Addendum)
Inpatient Diabetes Program Recommendations  AACE/ADA: New Consensus Statement on Inpatient Glycemic Control (2015)  Target Ranges:  Prepandial:   less than 140 mg/dL      Peak postprandial:   less than 180 mg/dL (1-2 hours)      Critically ill patients:  140 - 180 mg/dL   Lab Results  Component Value Date   GLUCAP 370 (H) 12/09/2016   HGBA1C 7.5 (H) 09/24/2016    Review of Glycemic Control Results for Barbara [name], Barbara [name] (MRN [id number]) as of 12/09/2016 12:28  Ref. Range 12/08/2016 15:03 12/08/2016 17:32 12/08/2016 22:22 12/09/2016 07:35 12/09/2016 09:11  Glucose-Capillary Latest Ref Range: 65 - 99 mg/dL 340 (H) 352 (H) 481 (H) 387 (H) 370 (H)   Diabetes history: DM2 Outpatient Diabetes medications: Lantus 13 units qd + Novolog 10 units am Novolog correction Current orders for Inpatient glycemic control: Lantus 15 + 0-15 tid +0-5 hs  Inpatient Diabetes Program Recommendations:  Noted hyperglycemia. Please consider while on steroids: Novolog 5-10 units tid meal coverage if eats 50%  Thank you, Darel Hong E. Darshan Solanki, RN, MSN, CDE  Diabetes Coordinator Inpatient Glycemic Control Team Team Pager [phone] (8am-5pm) 12/09/2016 12:30 PM

## 2016-12-09 NOTE — Progress Notes (Signed)
Pt. Is lethargic following robaxin, will hold PO meds for now. Paged ITS to confirm

## 2016-12-09 NOTE — Progress Notes (Signed)
Initial Nutrition Assessment  DOCUMENTATION CODES:   Morbid obesity  INTERVENTION:   -30 ml Prostat TID, each supplement provides 100 kcals and 15 grams protein -MVI daily  NUTRITION DIAGNOSIS:   Increased nutrient needs related to wound healing as evidenced by estimated needs.  GOAL:   Patient will meet greater than or equal to 90% of their needs  MONITOR:   PO intake, Supplement acceptance, Labs, Weight trends, Skin, I & O's  REASON FOR ASSESSMENT:   Low Braden    ASSESSMENT:   Barbara [name] is a 61 y.o. 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 PMH is significant for multiple sclerosis with spastic paraplegia, chronic prednisone use, indwelling chronic Foley, left lower extremity cellulitis, HTN, T2DM.   Pt admitted with dysuria and bladder cath infection.   Case discussed with RN, who reports pt was transferred from medical floor to SDU this AM due to increased lethargy. RN shares pt has periods of extreme lethargy and alertness. RN assisted pt with set-up of lunch tray.   Pt very sleepy at time of visit, but answered some of RD's close ended questions, however, responses are generally delayed. She reports she is hungry and had a good appetite PTA. However, pt did not seem interested in eating currently.   Reviewed wt hx, no weight loss over the past year. Wt gain likely related to edema.   RN confirmed presence of lt thigh wound, which she attributes to moisture.  Nutrition-Focused physical exam completed. Findings are no fat depletion, no muscle depletion, and moderate edema.   Pt continues to have hyperglycemia. Reviewed DM coordinator note. Outpatient Diabetes medications include  Lantus 13 units qd and Novolog 10 units am Novolog correction. Current orders for Inpatient glycemic control are Lantus 15 + 0-15 tid +0-5 hs. DM coordinator recommending meal coverage, as pt is on steroids (solu-cortef). Most recent  Hgb A1c: 7.5 (09/24/16).   Labs reviewed: CBGS: 370-387.   Diet Order:  Diet Carb Modified Fluid consistency: Thin; Room service appropriate? Yes  Skin:   (open lt thigh incision)  Last BM:  12/07/16  Height:   Ht Readings from Last 1 Encounters:  12/09/16 5\' 3"  (1.6 m)    Weight:   Wt Readings from Last 1 Encounters:  12/09/16 (!) 323 lb 12.8 oz (146.9 kg)    Ideal Body Weight:  47 kg  BMI:  Body mass index is 57.36 kg/m.  Estimated Nutritional Needs:   Kcal:  1600-1800  Protein:  115-130 grams  Fluid:  >1.6 L  EDUCATION NEEDS:   Education needs no appropriate at this time  Barbara [name], RD, LDN, CDE Pager: [phone] After hours Pager: [phone]

## 2016-12-09 NOTE — Progress Notes (Addendum)
At around 1:00am, during rounds, pt was assessed to be febrile while complaining of cold. Multiple blankets were applied and MD was notified of Oral temp 99.9 and Axillary temp of 102.16f. Rapid response was called and advised to remove heavy coverings to a thin covering to help bring down the fever. This was done. Also, cold wash cloths were applied to armpits, forehead, under the breast and the groin to help bring the temperature down.  Tylenol was given to pt. Pt's temp was rechecked about an hour afterwards rectally and was 104.10f. MD on call was notified and came to the bedside to assess the pt. MD advised to get a full set of vitals and ordered lactic acid, blood culture, IV Zosyn and IV linezolid. Orders carried out. Will continue to monitor.

## 2016-12-09 NOTE — Care Management Note (Addendum)
Case Management Note  Patient Details  Name: Barbara [name] MRN: [id number] Date of Birth: [birth year]  Subjective/Objective:      Pt admitted with bladder infection - pt with indwelling foley from home due to neurogenic bladder secondary to MS              Action/Plan:  PTA from home alone with family provided a lot of support.  Pt will likely need SNF - sister at bedside and pt is solement.  Pt is active with [facility] for [facility] and Aide.  CM requested PT/OT eval when medically stable.  CM will continue to follow for discharge needs   Expected Discharge Date:                  Expected Discharge Plan:  Skilled Nursing Facility  In-House Referral:  Clinical Social Work  Discharge planning Services  CM Consult  Post Acute Care Choice:    Choice offered to:     DME Arranged:    DME Agency:     HH Arranged:    HH Agency:     Status of Service:     If discussed at [employer], dates discussed:    Additional Comments: Pt is also active with [facility].  CM left voicemail for [employer] informing of admit Cherylann Parr, RN 12/09/2016, 4:03 PM

## 2016-12-09 NOTE — Progress Notes (Signed)
Called by patient's nurse that she had a temp of 104.65F about one hour after receiving Tylenol.  Patient had been complaining that she felt cold even after the nurse applied multiple blankets.  Went to see patient, and she was somnolent, but said she felt "fine."  Patient was warm to the touch but not erythematous like the previous night.  Dr. Alanda Slim and I advised the nurse to get a full set of vitals, and we ordered blood cultures, lactic acid, and linezolid/zosyn and D/C'd ceftriaxone.  Ordered linezolid rather than vancomycin due to possible red man syndrome last night.  Will continue to monitor closely.

## 2016-12-09 NOTE — Progress Notes (Signed)
Paged ITS for orders- pt. Unable to take PO meds due to lethargy- will wait for switch to IV

## 2016-12-09 NOTE — Care Management (Signed)
Pt has active insurance and therefore medication assistance can not be provided based on current medication regimen.  CM will continue to follow for discharge needs

## 2016-12-09 NOTE — Consult Note (Signed)
   [facility] CM Inpatient Consult   12/09/2016  Barbara [name] August 14, 1955 [id number]  Recurrent admissions:  Patient is currently active with [facility] Management for chronic disease management services in the [employer] plan ACO.Marland Kitchen  Patient has been engaged by a [employer] and CSW. Our community based plan of care has focused on disease management and community resource support. Patient with a History with [facility].  Call received from [facility] community care manager nurse regarding ongoing difficulties with patient living alone, with help from friends and family, not 24 hour care prior to admission.  Chart reviewed.   Will make  Inpatient Case Manager aware that [facility] Care Management following for progression and disposition. Admitted with Acute Cystitis and Hypoxia. Of note, [facility] Care Management services does not replace or interfere with any services that are needed or arranged by inpatient case management or social work.   1120 am 12/10/16:  Came by to see patient and she is sound asleep.  Will follow up on progression and care needs for post hospital for recurrent UTI, catheter issues, and needs.  For additional questions or referrals please contact:  Charlesetta Shanks, RN BSN CCM [facility]  [phone] business mobile phone Toll free office [phone]

## 2016-12-09 NOTE — Progress Notes (Addendum)
Family Medicine Teaching Service Daily Progress Note Intern Pager: [phone]  Patient name: Barbara [name] Medical record number: [id number] Date of birth: 1955/05/07 Age: 61 y.o. Gender: female  Primary Care Provider: Moses Manners, MD Consultants: none Code Status: DNR/DNI  Pt Overview and Major Events to Date:  8/4 admitted with UTI and CAP  Assessment and Plan: Barbara [name] is a 61 y.o. 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 PMH is significant for multiple sclerosis with spastic paraplegia, chronic prednisone use, indwelling chronic Foley, left lower extremity cellulitis, HTN, T2DM.   Sepsis: VS: 95/50, 117, febrile. increasing O2 requirements. Sources: Pt has known UTI, CXR concerning for PNA. WBC nl @ 8.1, lactic acid 2.3 -CXR - Bilateral airspace filling opacities, left greater than right suspicious for infectious process. -Abx: IV Zosyn/linezolid started o/n,  -d/c ceftriaxncone/ azithromycin  - hold lasix -reduced to prn diazepam, d/c tramadol -Blood cultures pending - follow lactic acid - repeat CXR - ABG, EKG stat   UTI: Urine culture pending. Lactate obtained overnight due to increased O2 requirement and fever normal at 1.7. Doubt septic picture. Continue current antibiotics and await culture for narrowing. Will need resumption of antibiotic prophylaxis for UTI when this acute course is completed. -d/c ceftriaxone -Monitor urine output -consider vaginal estrogen if persistent pain around foley site -follow urine culture   Hypoxia: Will treat as pneumonia, consider discussion of modification of diet with patient prior to ordering speech therapy evaluation. See goals of care below. Currently weaned to 3 L nasal cannula, suspect obesity hypoventilation syndrome as a portion of the etiology of her hypoxia, this is been off and on over the past year. Given that she is immobile, and unlikely to trip over it, home  oxygen may be a reasonable consideration for her to avoid future admissions. -d/c azithromycin 500 mg daily -d/c Ceftriaxone as above -Consider home oxygen -Wean nasal cannula as tolerated -Discuss goals of care prior to ordering speech evaluation  Multiple Sclerosis with spastic paraplegia on chronic steroids: Currently taking 20mg  prednisone at home. Plan for very slow taper of prednisone made at recent home visit, refer to Dr. Deland Pretty note on 7/26, to decrease to 15 mg on 8/13. Has been treated with stress dose steroids in the past, did not give in this instance due to vital sign stability, no lactic acidosis, no signs of sepsis and given concomitant elevated CBGs. - Continue home Valium, gabapentin, Robaxin, Tramadol - Given clinically well appearance, no need for stress steroid dosing at present  HTN: Currently normotensive. On Lasix 40 mg twice a day at home.  - hold BP meds due to hypotension  Hx of adrenal insufficiency: See plan for multiple sclerosis above. Continue home 20 mg prednisone. - given stress dose hydro cortisone  T2DM:Insulin regimen prior to arrival unclear, CBGs poorly controlled when home visit was conducted on 7/26. Unclear if this poor control is due to infection versus confusion about insulin regimen. Hypoglycemia at present, but no signs of acidosis. Will continue to chase blood sugars with short acting NovoLog.  - fasting CBG 300s, consider increasing lantus to 20 units - lantus 15 - SSI-m - CBG qACHS - Clarify insulin regimen with PCP prior to discharge  Goals of care: Patient has a long-standing relationship with PCP, Dr. Leveda [name]. Long discussion of goals of care at last admission Included completing and most form. At this time, patient is DO NOT RESUSCITATE/DO NOT INTUBATE, does not desire ICU admission, does not desire  dialysis, does not desire feeding tube. She is amenable to short-term hospitalizations for IV fluids and antibiotics. -She notes  that she is comfortable going home with oxygen if necessary -Continue to reevaluate goals of care  FEN/GI: carb mod diet Prophylaxis: lovenox  Disposition: worsening due to sepsis  Subjective:  Pt is somnolent. Rousasable, but unable to stay awake.    Objective: Temp:  [97.4 F (36.3 C)-104.5 F (40.3 C)] 102.9 F (39.4 C) (08/06 0525) Pulse Rate:  [86-117] 117 (08/06 0525) Resp:  [10-22] 20 (08/06 0525) BP: (95-129)/(50-76) 95/50 (08/06 0525) SpO2:  [86 %-98 %] 92 % (08/06 0525) Physical Exam: General: Flush face and extremities, Obese female with moon facies, somnolent, unable to answer questions  HEENT: Flush red face Cardiovascular: tachycardic, nl s1/s2, no murmur Respiratory: CTAB, no increased work of breathing Abdomen: soft,nondistended Extremities: Flush upper extremities bilaterally, warm to touch. Bilateral lower extremities wrapped in [place], toes exposed, also warm/flush  Laboratory:  Recent Labs Lab 12/08/16 0351 12/08/16 1644 12/09/16 0407  WBC 13.1* 9.6 8.1  HGB 12.0 11.5* 12.4  HCT 39.4 38.5 41.0  PLT 215 194 171    Recent Labs Lab 12/08/16 0351 12/08/16 1644 12/09/16 0407  NA 134* 136 137  K 3.1* 3.5 3.6  CL 89* 88* 90*  CO2 36* 37* 36*  BUN 15 16 15   CREATININE 0.94 0.87 1.06*  CALCIUM 9.1 8.7* 9.0  GLUCOSE 425* 453* 307*   Lactic Acid, Venous    Component Value Date/Time   LATICACIDVEN 2.3 (HH) 12/09/2016 0407   Dg Chest 2 View  Result Date: 12/07/2016 CLINICAL DATA:  61 year old female with progressive hyperglycemia. Shortness of breath, hypoxia. EXAM: CHEST  2 VIEW COMPARISON:  10/27/2016 and earlier. FINDINGS: Semi upright AP and lateral views of the chest. Stable lung volumes. Stable enlarged cardiac and mediastinal contours, seen by chest CTA on 07/20/2016 to be primarily due to mediastinal lipomatosis. Decreased pulmonary vascularity compared to 10/27/2016, with no acute edema suspected today. No pneumothorax, pleural effusion or  consolidation. Prior cervical ACDF. IMPRESSION: 1. No acute cardiopulmonary abnormality. Regressed pulmonary vascularity compared to 10/27/2016. 2. Mediastinal lipomatosis. Electronically Signed   By: Odessa Fleming M.D.   On: 12/07/2016 14:09   Ct Angio Chest Pe W And/or Wo Contrast  Result Date: 12/07/2016 CLINICAL DATA:  Shortness of breath.  Suspected pulmonary embolism. EXAM: CT ANGIOGRAPHY CHEST WITH CONTRAST TECHNIQUE: Multidetector CT imaging of the chest was performed using the standard protocol during bolus administration of intravenous contrast. Multiplanar CT image reconstructions and MIPs were obtained to evaluate the vascular anatomy. CONTRAST:  100 cc Isovue 370 intravenous COMPARISON:  07/20/2016 FINDINGS: Cardiovascular: Pulmonary artery opacification is suboptimal due to bolus dispersion and patient size. There is reasonable visualization to the segmental level with no evidence of pulmonary embolism. Dilated main pulmonary artery at 43 mm, compatible with pulmonary hypertension. No acute aortic finding. Mild calcification at the aortic valve level. No notable atherosclerotic calcification. Mild thinning cardiomegaly. Mediastinum/Nodes: Mediastinal lipomatosis. Negative for adenopathy or mass. Lungs/Pleura: Low volume chest. There is asymmetric ground-glass opacity in the left lower lobe and right perihilar lung. Atelectatic type opacities that are subsegmental. Mosaic attenuation lungs which could be from small airway or small vessel disease. Upper Abdomen: No acute finding. Musculoskeletal: No acute or aggressive finding. Prominent spondylosis. Review of the MIP images confirms the above findings. IMPRESSION: 1. No evidence of pulmonary embolism. Sensitivity is diminished by patient body habitus. 2. Asymmetric airspace disease in the left lower lobe primarily suspicious  for pneumonia. Right perihilar ground-glass opacity could also be infectious. 3. Enlarged pulmonary arteries as seen with pulmonary  hypertension. 4. Mild atelectasis. Electronically Signed   By: Marnee Spring M.D.   On: 12/07/2016 15:30   Dg Chest Port 1 View  Result Date: 12/08/2016 CLINICAL DATA:  somnolence EXAM: PORTABLE CHEST 1 VIEW COMPARISON:  12/08/2016 FINDINGS: The heart is enlarged. There is airspace filling opacity in the left lower lobe, associated with air bronchograms. Similar findings are identified at the medial right lung base. Suspect left pleural effusion. IMPRESSION: 1. Cardiomegaly. 2. Bilateral airspace filling opacities, left greater than right suspicious for infectious process. Electronically Signed   By: Norva Pavlov M.D.   On: 12/08/2016 20:18   Dg Chest Port 1 View  Result Date: 12/08/2016 CLINICAL DATA:  61 year old female with shortness of breath. EXAM: PORTABLE CHEST 1 VIEW COMPARISON:  Chest CT dated 12/07/2016 FINDINGS: There is bilateral central vascular prominence likely mild vascular congestion. Asymmetric airspace density primarily a left lung base with silhouetting of the left hemidiaphragm concerning for pneumonia. There is probable small left pleural effusion as seen on the prior CT. No pneumothorax. The cardiac borders are silhouetted. Lower cervical spine ACDF. No acute osseous pathology. IMPRESSION: 1. Left lung base opacity with silhouetting of the left hemidiaphragm concerning for pneumonia. Clinical correlation and follow-up recommended. 2. Probable mild vascular congestion. Electronically Signed   By: Elgie Collard M.D.   On: 12/08/2016 04:00    Garnette Gunner, MD 12/09/2016, 6:27 AM PGY-2, [facility] FPTS Intern pager: [phone], text pages welcome

## 2016-12-09 NOTE — Progress Notes (Signed)
CRITICAL VALUE ALERT  Critical Value:  Lactic Acid 2.3  Date & Time Notied:  12/09/16 ; 5:45am  Provider Notified: Dr [name]  Orders Received/Actions taken: No new orders. Will continue to monitor

## 2016-12-09 NOTE — Progress Notes (Signed)
At around 5:15am, MD called to follow up on pt. A new set of vitals was requested and completed. MD thereafter ordered a 500cc bolus. Order was completed. Will continue to monitor.

## 2016-12-09 NOTE — Progress Notes (Signed)
CBG 387 this morning 15 units of novolog given CBG 9;15 370, provider notified.

## 2016-12-10 ENCOUNTER — Ambulatory Visit: Payer: Self-pay | Admitting: *Deleted

## 2016-12-10 ENCOUNTER — Inpatient Hospital Stay (HOSPITAL_COMMUNITY): Payer: PPO

## 2016-12-10 DIAGNOSIS — T3695XD Adverse effect of unspecified systemic antibiotic, subsequent encounter: Secondary | ICD-10-CM

## 2016-12-10 DIAGNOSIS — N319 Neuromuscular dysfunction of bladder, unspecified: Secondary | ICD-10-CM | POA: Diagnosis present

## 2016-12-10 LAB — CBC
HCT: 37.8 % (ref 36.0–46.0)
HEMOGLOBIN: 11.5 g/dL — AB (ref 12.0–15.0)
MCH: 26 pg (ref 26.0–34.0)
MCHC: 30.4 g/dL (ref 30.0–36.0)
MCV: 85.3 fL (ref 78.0–100.0)
PLATELETS: 124 10*3/uL — AB (ref 150–400)
RBC: 4.43 MIL/uL (ref 3.87–5.11)
RDW: 18.8 % — ABNORMAL HIGH (ref 11.5–15.5)
WBC: 9.8 10*3/uL (ref 4.0–10.5)

## 2016-12-10 LAB — BASIC METABOLIC PANEL
Anion gap: 10 (ref 5–15)
BUN: 19 mg/dL (ref 6–20)
CALCIUM: 8.2 mg/dL — AB (ref 8.9–10.3)
CHLORIDE: 90 mmol/L — AB (ref 101–111)
CO2: 36 mmol/L — ABNORMAL HIGH (ref 22–32)
CREATININE: 1 mg/dL (ref 0.44–1.00)
GFR, EST NON AFRICAN AMERICAN: 60 mL/min — AB (ref >60)
Glucose, Bld: 340 mg/dL — ABNORMAL HIGH (ref 65–99)
Potassium: 3.6 mmol/L (ref 3.5–5.1)
SODIUM: 136 mmol/L (ref 135–145)

## 2016-12-10 LAB — GLUCOSE, CAPILLARY
GLUCOSE-CAPILLARY: 327 mg/dL — AB (ref 65–99)
GLUCOSE-CAPILLARY: 317 mg/dL — AB (ref 65–99)
Glucose-Capillary: 278 mg/dL — ABNORMAL HIGH (ref 65–99)
Glucose-Capillary: 260 mg/dL — ABNORMAL HIGH (ref 65–99)

## 2016-12-10 LAB — MAGNESIUM: MAGNESIUM: 1.8 mg/dL (ref 1.7–2.4)

## 2016-12-10 LAB — PHOSPHORUS: PHOSPHORUS: 4.3 mg/dL (ref 2.5–4.6)

## 2016-12-10 MED ORDER — INSULIN ASPART 100 UNIT/ML ~~LOC~~ SOLN
5.0000 [IU] | Freq: Three times a day (TID) | SUBCUTANEOUS | Status: DC
  Administered 2016-12-11 – 2016-12-13 (×7): 5 [IU] via SUBCUTANEOUS

## 2016-12-10 MED ORDER — INSULIN GLARGINE 100 UNIT/ML ~~LOC~~ SOLN
30.0000 [IU] | Freq: Every day | SUBCUTANEOUS | Status: DC
  Administered 2016-12-10: 30 [IU] via SUBCUTANEOUS
  Filled 2016-12-10 (×2): qty 0.3

## 2016-12-10 MED ORDER — FUROSEMIDE 20 MG PO TABS: 20.0000 mg | ORAL_TABLET | Freq: Two times a day (BID) | ORAL | Status: DC

## 2016-12-10 MED ORDER — IPRATROPIUM-ALBUTEROL 0.5-2.5 (3) MG/3ML IN SOLN
3.0000 mL | Freq: Once | RESPIRATORY_TRACT | Status: AC
  Administered 2016-12-10: 3 mL via RESPIRATORY_TRACT
  Filled 2016-12-10: qty 3

## 2016-12-10 MED ORDER — HYDROCORTISONE NA SUCCINATE PF 100 MG IJ SOLR
100.0000 mg | Freq: Every day | INTRAMUSCULAR | Status: DC
  Administered 2016-12-11 – 2016-12-12 (×2): 100 mg via INTRAVENOUS
  Filled 2016-12-10 (×2): qty 2

## 2016-12-10 MED ORDER — TRAMADOL HCL 50 MG PO TABS
50.0000 mg | ORAL_TABLET | Freq: Three times a day (TID) | ORAL | Status: DC | PRN
  Administered 2016-12-10 – 2016-12-15 (×14): 50 mg via ORAL
  Filled 2016-12-10 (×14): qty 1

## 2016-12-10 MED ORDER — FUROSEMIDE 20 MG PO TABS
20.0000 mg | ORAL_TABLET | Freq: Two times a day (BID) | ORAL | Status: DC
  Administered 2016-12-10 – 2016-12-11 (×2): 20 mg via ORAL
  Filled 2016-12-10 (×2): qty 1

## 2016-12-10 MED ORDER — GABAPENTIN 300 MG PO CAPS
300.0000 mg | ORAL_CAPSULE | Freq: Three times a day (TID) | ORAL | Status: DC
  Administered 2016-12-10 – 2016-12-16 (×20): 300 mg via ORAL
  Filled 2016-12-10 (×19): qty 1

## 2016-12-10 NOTE — Progress Notes (Signed)
Inpatient Diabetes Program Recommendations  AACE/ADA: New Consensus Statement on Inpatient Glycemic Control (2015)  Target Ranges:  Prepandial:   less than 140 mg/dL      Peak postprandial:   less than 180 mg/dL (1-2 hours)      Critically ill patients:  140 - 180 mg/dL   Lab Results  Component Value Date   GLUCAP 278 (H) 12/10/2016   HGBA1C 7.5 (H) 09/24/2016    Review of Glycemic ControlResults for NATALLE, KUHN (MRN [id number]) as of 12/10/2016 09:25  Ref. Range 12/09/2016 10:24 12/09/2016 11:39 12/09/2016 16:33 12/09/2016 21:03 12/10/2016 08:10  Glucose-Capillary Latest Ref Range: 65 - 99 mg/dL 269 (H) 485 (H) 462 (H) 377 (H) 278 (H)   Diabetes history: Type 2 diabetes Outpatient Diabetes medications: Lantus 13 units q HS, Novolog 10 units for every 100 mg/dL above fasting 703 mg/dL  Current orders for Inpatient glycemic control:  Novolog resistant tid with meals and HS Lantus 20 units q HS Inpatient Diabetes Program Recommendations:    Please consider increasing Lantus to 26 units daily.  Also consider adding Novolog meal coverage 6 units tid with meals.    Thanks, Beryl Meager, RN, BC-ADM Inpatient Diabetes Coordinator Pager [phone] (8a-5p)

## 2016-12-10 NOTE — Progress Notes (Signed)
Pt. complaining of increased work of breathing, feeling short of breath, and increased wheezing. 91 on 5L [place]. Paged TS for instruction. Will continue to monitor.  MD ordered breathing treatment. Will continue to monitor.

## 2016-12-10 NOTE — Progress Notes (Signed)
FPTS Interim Progress Note  S: Visited patient this evening to follow up on respiratory status. Patient awake and interactive. She endorses that she had some difficulty breathing earlier in the day but that has improved. Asking if she can have a dose of her tramadol or muscle relaxer for pain in back and legs.  O: BP 98/61 (BP Location: Left Wrist)   Pulse 77   Temp 97.7 F (36.5 C) (Oral)   Resp 11   Ht 5\' 3"  (1.6 m)   Wt (!) 355 lb (161 kg)   LMP 10/07/2010   SpO2 94%   BMI 62.89 kg/m   Gen: alert but closes eyes while talking, obese, laying in bed, in NAD Cardiac: Regular rate and rhythm Pulmonary: Normal work of breathing, clear to ausculation on anterior chest, on 4L [place]  A/P: Acute respiratory failure: Continue to treat patient for CAP, respiratory status improved after duoneb x 1 and home lasix given. Still has O2 requirement of 4 L (previously on 5 L this morning) with O2 saturation in the mid 90's. Will continue to monitor overnight. Appreciate excellent RN care.   Beaulah Dinning, MD 12/10/2016, 9:01 PM PGY-3, [facility] Family Medicine Service pager [phone]

## 2016-12-10 NOTE — Progress Notes (Addendum)
Family Medicine Teaching Service Daily Progress Note Intern Pager: [phone]  Patient name: Barbara [name] Medical record number: [id number] Date of birth: 1955-11-19 Age: 61 y.o. Gender: female  Primary Care Provider: Moses Manners, MD Consultants: none Code Status: DNR/DNI  Pt Overview and Major Events to Date:  8/4 admitted with UTI and CAP  Assessment and Plan: Barbara [name] is a 61 y.o. 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 PMH is significant for multiple sclerosis with spastic paraplegia, chronic prednisone use, indwelling chronic Foley, left lower extremity cellulitis, HTN, T2DM.   Sepsis: Improved. Last VSS. Neuro status improved. Still very Somnolent. Source PNA vs. UTI.  -reduce sedating meds as much as possible vs. Pain/muscle spasm control  -Head CT negative, will order MRI head w/o conrast -follow neuro exam CXR - Bilateral airspace filling opacities, left greater than right suspicious for infectious process. -Abx: IV Zosyn/linezolid started o/n 08/04,  - urine culture showed multiple growth, re-oredered -Blood cultures pending - follow lactic acid  Edema. Pt has chronic edema in lower extremities. New found pitting edema on abdomen.  - restart lasix at 1/2 dose home lasix, if no hypotension, increase to home dosage - daily weights  - monitor fluid status - strict I/Os     UTI: Urine culture reordered. Continue current antibiotics and await culture for narrowing. Will need resumption of antibiotic prophylaxis for UTI when this acute course is completed. -cont zosyn/linezolid -Monitor I/Os -consider vaginal estrogen if persistent pain around foley site -follow urine culture    Hypoxia: Will treat as pneumonia, See goals of care below. 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 Given that  she is immobile, and unlikely to trip over it, home oxygen may be a reasonable consideration for her to avoid future admissions. -cont-zosyn/linezolid -Consider home oxygen -Wean nasal cannula as tolerated  Multiple Sclerosis with spastic paraplegia on chronic steroids: Currently taking 20mg  prednisone at home. Plan for very slow taper of prednisone made at recent home visit, refer to Dr. Deland Pretty note on 7/26, to decrease to 15 mg on 8/13. Has been treated with stress dose steroids in the past, did not give in this instance due to vital sign stability, no lactic acidosis, no signs of sepsis and given concomitant elevated CBGs. - pt still experiencing pain, restart tramadol - hold diazepam - consider holding sedating meds based on solmolence  -Given clinically well appearance, no need for stress steroid dosing at present  HTN: Currently normotensive. On Lasix 40 mg twice a day at home.  - restart lasix 20 mg - hold if hypotensive  Hx of adrenal insufficiency: See plan for multiple sclerosis above. Continue home 20 mg prednisone. -stress dose hydro cortisone  T2DM:Insulin regimen prior to arrival unclear, CBGs poorly controlled when home visit was conducted on 7/26. Unclear if this poor control is due to infection versus confusion about insulin regimen. Hypoglycemia at present, but no signs of acidosis. Will continue to chase blood sugars with short acting NovoLog.  - GBGs cont in 300s -increase lantus to 30 -5 units novolog with meals tid - SSI-resistant - CBG qACHS - Clarify insulin regimen with PCP prior to discharge  Goals of care: Patient has a long-standing relationship with PCP, [name]. Long discussion of goals of care at last admission Included completing and most form. At this time, patient is DO NOT  RESUSCITATE/DO NOT INTUBATE, does not desire ICU admission, does not desire dialysis, does not desire feeding tube. She is amenable to short-term hospitalizations for IV  fluids and antibiotics. -She notes that she is comfortable going home with oxygen if necessary -Continue to reevaluate goals of care  FEN/GI: carb mod diet Prophylaxis: lovenox  Disposition: watch, improving sepsis  Subjective:  Pt is somnolent. Rousasable, but unable to stay awake. Able to answer questions and cooperate with exam. Reports being in pain. Persistant right sided numbness  Objective: Temp:  [97.5 F (36.4 C)-101.3 F (38.5 C)] 97.5 F (36.4 C) (08/07 0315) Pulse Rate:  [77-115] 82 (08/07 0430) Resp:  [11-26] 20 (08/07 0430) BP: (90-130)/(58-85) 122/74 (08/07 0430) SpO2:  [90 %-100 %] 92 % (08/07 0430) Weight:  [323 lb 12.8 oz (146.9 kg)] 323 lb 12.8 oz (146.9 kg) (08/06 1032) Physical Exam: General: mildly flush, improved, Obese female with moon facies, somnolent, able to answer questions HEENT: PERRLA, mildly flush faced Cardiovascular: nl s1/s2, no murmur Respiratory: CTAB, no increased work of breathing, no tongue deviation, no asymmetry in smille Abdomen: soft,nondistended, improved pitting edema Extremities: improved,  Bilateral lower extremities wrapped in [place], toes exposed,  Neuro: normal grip strength bilaterally, pt able to raise both arms, unable to perform remainder of upper extremity strength exam due to somnolence   Laboratory:  Recent Labs Lab 12/08/16 1644 12/09/16 0407 12/10/16 0236  WBC 9.6 8.1 9.8  HGB 11.5* 12.4 11.5*  HCT 38.5 41.0 37.8  PLT 194 171 124*    Recent Labs Lab 12/08/16 1644 12/09/16 0407 12/10/16 0236  NA 136 137 136  K 3.5 3.6 3.6  CL 88* 90* 90*  CO2 37* 36* 36*  BUN 16 15 19   CREATININE 0.87 1.06* 1.00  CALCIUM 8.7* 9.0 8.2*  GLUCOSE 453* 307* 340*   Lactic Acid, Venous    Component Value Date/Time   LATICACIDVEN 2.3 (HH) 12/09/2016 0407   Dg Chest 2 View  Result Date: 12/07/2016 CLINICAL DATA:  61 year old female with progressive hyperglycemia. Shortness of breath, hypoxia. EXAM: CHEST  2 VIEW  COMPARISON:  10/27/2016 and earlier. FINDINGS: Semi upright AP and lateral views of the chest. Stable lung volumes. Stable enlarged cardiac and mediastinal contours, seen by chest CTA on 07/20/2016 to be primarily due to mediastinal lipomatosis. Decreased pulmonary vascularity compared to 10/27/2016, with no acute edema suspected today. No pneumothorax, pleural effusion or consolidation. Prior cervical ACDF. IMPRESSION: 1. No acute cardiopulmonary abnormality. Regressed pulmonary vascularity compared to 10/27/2016. 2. Mediastinal lipomatosis. Electronically Signed   By: Odessa Fleming M.D.   On: 12/07/2016 14:09   Ct Angio Chest Pe W And/or Wo Contrast  Result Date: 12/07/2016 CLINICAL DATA:  Shortness of breath.  Suspected pulmonary embolism. EXAM: CT ANGIOGRAPHY CHEST WITH CONTRAST TECHNIQUE: Multidetector CT imaging of the chest was performed using the standard protocol during bolus administration of intravenous contrast. Multiplanar CT image reconstructions and MIPs were obtained to evaluate the vascular anatomy. CONTRAST:  100 cc Isovue 370 intravenous COMPARISON:  07/20/2016 FINDINGS: Cardiovascular: Pulmonary artery opacification is suboptimal due to bolus dispersion and patient size. There is reasonable visualization to the segmental level with no evidence of pulmonary embolism. Dilated main pulmonary artery at 43 mm, compatible with pulmonary hypertension. No acute aortic finding. Mild calcification at the aortic valve level. No notable atherosclerotic calcification. Mild thinning cardiomegaly. Mediastinum/Nodes: Mediastinal lipomatosis. Negative for adenopathy or mass. Lungs/Pleura: Low volume chest. There is asymmetric ground-glass opacity in the left lower lobe and right perihilar lung.  Atelectatic type opacities that are subsegmental. Mosaic attenuation lungs which could be from small airway or small vessel disease. Upper Abdomen: No acute finding. Musculoskeletal: No acute or aggressive finding. Prominent  spondylosis. Review of the MIP images confirms the above findings. IMPRESSION: 1. No evidence of pulmonary embolism. Sensitivity is diminished by patient body habitus. 2. Asymmetric airspace disease in the left lower lobe primarily suspicious for pneumonia. Right perihilar ground-glass opacity could also be infectious. 3. Enlarged pulmonary arteries as seen with pulmonary hypertension. 4. Mild atelectasis. Electronically Signed   By: Marnee Spring M.D.   On: 12/07/2016 15:30   Dg Chest Port 1 View  Result Date: 12/08/2016 CLINICAL DATA:  somnolence EXAM: PORTABLE CHEST 1 VIEW COMPARISON:  12/08/2016 FINDINGS: The heart is enlarged. There is airspace filling opacity in the left lower lobe, associated with air bronchograms. Similar findings are identified at the medial right lung base. Suspect left pleural effusion. IMPRESSION: 1. Cardiomegaly. 2. Bilateral airspace filling opacities, left greater than right suspicious for infectious process. Electronically Signed   By: Norva Pavlov M.D.   On: 12/08/2016 20:18   Dg Chest Port 1 View  Result Date: 12/08/2016 CLINICAL DATA:  61 year old female with shortness of breath. EXAM: PORTABLE CHEST 1 VIEW COMPARISON:  Chest CT dated 12/07/2016 FINDINGS: There is bilateral central vascular prominence likely mild vascular congestion. Asymmetric airspace density primarily a left lung base with silhouetting of the left hemidiaphragm concerning for pneumonia. There is probable small left pleural effusion as seen on the prior CT. No pneumothorax. The cardiac borders are silhouetted. Lower cervical spine ACDF. No acute osseous pathology. IMPRESSION: 1. Left lung base opacity with silhouetting of the left hemidiaphragm concerning for pneumonia. Clinical correlation and follow-up recommended. 2. Probable mild vascular congestion. Electronically Signed   By: Elgie Collard M.D.   On: 12/08/2016 04:00    Garnette Gunner, MD 12/10/2016, 6:27 AM PGY-2, [facility] FPTS Intern pager: [phone], text pages welcome

## 2016-12-10 NOTE — Progress Notes (Signed)
FPTS Interim Progress Note  S:Pt complaining of SOB, mild HA and feeling lightheaded. Complaining of back/lower leg pain. Denies any cp.   O: BP 108/85 (BP Location: Right Wrist)   Pulse 85   Temp 98.7 F (37.1 C) (Oral)   Resp 16   Ht 5\' 3"  (1.6 m)   Wt (!) 323 lb 12.8 oz (146.9 kg)   LMP 10/07/2010   SpO2 92%   BMI 57.36 kg/m   PE:  Gen: mild distress, somnolent, but improved since AM CVS: rrr,  Lungs: no wheezes/rhonchi, mild increased work of breathing MSK: R hand edema, normal grip strength bilaterally   A/P: Dyspnea -pt s/p dou-neb treatment, satting 95% on 5L -reschedule PO lasix for now -consider, IV lasix if does not improve   Garnette Gunner, MD 12/10/2016, 3:11 PM PGY-1, [facility] Family Medicine Service pager [phone]

## 2016-12-10 NOTE — Progress Notes (Signed)
PCP social visit.  More alert and also in more pain.  Ordered gabapentin at a lower dose (300 versus 600) because she tells me that works best for her leg pain.  Issues similar to yesterday. 1. Goals of care: Still wants reversible things treated.  Not yet ready to make comfort care only decision. 2. Dispo.  Still wants to go home if it is an option.  We will need to verify that [facility] is still willing to have her as a patient. 3. Acute illness seems primarily pulmonary.  Treatment ongoing.

## 2016-12-11 ENCOUNTER — Inpatient Hospital Stay (HOSPITAL_COMMUNITY): Payer: PPO

## 2016-12-11 ENCOUNTER — Ambulatory Visit: Payer: Self-pay | Admitting: *Deleted

## 2016-12-11 DIAGNOSIS — N319 Neuromuscular dysfunction of bladder, unspecified: Secondary | ICD-10-CM

## 2016-12-11 LAB — COMPREHENSIVE METABOLIC PANEL
ALBUMIN: 2.1 g/dL — AB (ref 3.5–5.0)
ALK PHOS: 77 U/L (ref 38–126)
ALT: 22 U/L (ref 14–54)
AST: 71 U/L — AB (ref 15–41)
Anion gap: 11 (ref 5–15)
BILIRUBIN TOTAL: 1.3 mg/dL — AB (ref 0.3–1.2)
BUN: 20 mg/dL (ref 6–20)
CO2: 35 mmol/L — ABNORMAL HIGH (ref 22–32)
Calcium: 8.2 mg/dL — ABNORMAL LOW (ref 8.9–10.3)
Chloride: 89 mmol/L — ABNORMAL LOW (ref 101–111)
Creatinine, Ser: 0.96 mg/dL (ref 0.44–1.00)
GFR calc Af Amer: >60 mL/min (ref >60)
GFR calc non Af Amer: >60 mL/min (ref >60)
GLUCOSE: 194 mg/dL — AB (ref 65–99)
POTASSIUM: 4.2 mmol/L (ref 3.5–5.1)
Sodium: 135 mmol/L (ref 135–145)
TOTAL PROTEIN: 5.4 g/dL — AB (ref 6.5–8.1)

## 2016-12-11 LAB — BASIC METABOLIC PANEL
Anion gap: 11 (ref 5–15)
BUN: 21 mg/dL — ABNORMAL HIGH (ref 6–20)
CALCIUM: 8.4 mg/dL — AB (ref 8.9–10.3)
CO2: 34 mmol/L — ABNORMAL HIGH (ref 22–32)
CREATININE: 0.94 mg/dL (ref 0.44–1.00)
Chloride: 91 mmol/L — ABNORMAL LOW (ref 101–111)
GFR calc non Af Amer: >60 mL/min (ref >60)
Glucose, Bld: 259 mg/dL — ABNORMAL HIGH (ref 65–99)
Potassium: 3.4 mmol/L — ABNORMAL LOW (ref 3.5–5.1)
SODIUM: 136 mmol/L (ref 135–145)

## 2016-12-11 LAB — URINE CULTURE

## 2016-12-11 LAB — BLOOD CULTURE ID PANEL (REFLEXED)
Acinetobacter baumannii: NOT DETECTED
CANDIDA KRUSEI: NOT DETECTED
CANDIDA PARAPSILOSIS: NOT DETECTED
CANDIDA TROPICALIS: NOT DETECTED
Candida albicans: NOT DETECTED
Candida glabrata: NOT DETECTED
ESCHERICHIA COLI: NOT DETECTED
Enterobacter cloacae complex: NOT DETECTED
Enterobacteriaceae species: NOT DETECTED
Enterococcus species: NOT DETECTED
Haemophilus influenzae: NOT DETECTED
KLEBSIELLA OXYTOCA: NOT DETECTED
KLEBSIELLA PNEUMONIAE: NOT DETECTED
Listeria monocytogenes: NOT DETECTED
Neisseria meningitidis: NOT DETECTED
PROTEUS SPECIES: NOT DETECTED
Pseudomonas aeruginosa: NOT DETECTED
SERRATIA MARCESCENS: NOT DETECTED
STAPHYLOCOCCUS AUREUS BCID: NOT DETECTED
STAPHYLOCOCCUS SPECIES: NOT DETECTED
STREPTOCOCCUS PYOGENES: NOT DETECTED
Streptococcus agalactiae: NOT DETECTED
Streptococcus pneumoniae: NOT DETECTED
Streptococcus species: NOT DETECTED

## 2016-12-11 LAB — GLUCOSE, CAPILLARY
GLUCOSE-CAPILLARY: 192 mg/dL — AB (ref 65–99)
GLUCOSE-CAPILLARY: 253 mg/dL — AB (ref 65–99)
Glucose-Capillary: 184 mg/dL — ABNORMAL HIGH (ref 65–99)
Glucose-Capillary: 245 mg/dL — ABNORMAL HIGH (ref 65–99)

## 2016-12-11 LAB — CBC
HCT: 37.3 % (ref 36.0–46.0)
HEMOGLOBIN: 11.2 g/dL — AB (ref 12.0–15.0)
MCH: 25.7 pg — ABNORMAL LOW (ref 26.0–34.0)
MCHC: 30 g/dL (ref 30.0–36.0)
MCV: 85.7 fL (ref 78.0–100.0)
Platelets: 142 10*3/uL — ABNORMAL LOW (ref 150–400)
RBC: 4.35 MIL/uL (ref 3.87–5.11)
RDW: 18.7 % — AB (ref 11.5–15.5)
WBC: 9.8 10*3/uL (ref 4.0–10.5)

## 2016-12-11 MED ORDER — FUROSEMIDE 10 MG/ML IJ SOLN
20.0000 mg | Freq: Three times a day (TID) | INTRAMUSCULAR | Status: DC
  Administered 2016-12-11 – 2016-12-12 (×3): 20 mg via INTRAVENOUS
  Filled 2016-12-11 (×3): qty 2

## 2016-12-11 MED ORDER — DIAZEPAM 5 MG/ML IJ SOLN: 5.0000 mg | Freq: Once | INTRAMUSCULAR | Status: DC

## 2016-12-11 MED ORDER — INSULIN GLARGINE 100 UNIT/ML ~~LOC~~ SOLN
40.0000 [IU] | Freq: Every day | SUBCUTANEOUS | Status: DC
  Administered 2016-12-11: 40 [IU] via SUBCUTANEOUS
  Filled 2016-12-11: qty 0.4

## 2016-12-11 MED ORDER — FUROSEMIDE 10 MG/ML IJ SOLN: 20.0000 mg | Freq: Once | INTRAMUSCULAR | Status: DC

## 2016-12-11 NOTE — Progress Notes (Signed)
PHARMACY - PHYSICIAN COMMUNICATION CRITICAL VALUE ALERT - BLOOD CULTURE IDENTIFICATION (BCID)  Results for orders placed or performed during the hospital encounter of 12/07/16  Blood Culture ID Panel (Reflexed) (Collected: 12/09/2016  4:57 AM)  Result Value Ref Range   Enterococcus species NOT DETECTED NOT DETECTED   Listeria monocytogenes NOT DETECTED NOT DETECTED   Staphylococcus species NOT DETECTED NOT DETECTED   Staphylococcus aureus NOT DETECTED NOT DETECTED   Streptococcus species NOT DETECTED NOT DETECTED   Streptococcus agalactiae NOT DETECTED NOT DETECTED   Streptococcus pneumoniae NOT DETECTED NOT DETECTED   Streptococcus pyogenes NOT DETECTED NOT DETECTED   Acinetobacter baumannii NOT DETECTED NOT DETECTED   Enterobacteriaceae species NOT DETECTED NOT DETECTED   Enterobacter cloacae complex NOT DETECTED NOT DETECTED   Escherichia coli NOT DETECTED NOT DETECTED   Klebsiella oxytoca NOT DETECTED NOT DETECTED   Klebsiella pneumoniae NOT DETECTED NOT DETECTED   Proteus species NOT DETECTED NOT DETECTED   Serratia marcescens NOT DETECTED NOT DETECTED   Haemophilus influenzae NOT DETECTED NOT DETECTED   Neisseria meningitidis NOT DETECTED NOT DETECTED   Pseudomonas aeruginosa NOT DETECTED NOT DETECTED   Candida albicans NOT DETECTED NOT DETECTED   Candida glabrata NOT DETECTED NOT DETECTED   Candida krusei NOT DETECTED NOT DETECTED   Candida parapsilosis NOT DETECTED NOT DETECTED   Candida tropicalis NOT DETECTED NOT DETECTED     Patient has variable gm negative rods in  Anaerobic bottle only - Brown, branching appearance per micro lab . No BCID results   Name of physician (or Provider) Contacted: Fanny Bien  Changes to prescribed antibiotics required: Patient on Zosyn. Would leave for now with uncertain identification.   Della Goo, PharmD PGY2 Infectious Diseases Pharmacy Resident Pager: [phone]  12/11/2016  1:52 PM

## 2016-12-11 NOTE — Care Management Note (Addendum)
Case Management Note  Patient Details  Name: Barbara [name] MRN: [id number] Date of Birth: 1955-10-17  Subjective/Objective:      Pt admitted with bladder infection - pt with indwelling foley from home due to neurogenic bladder secondary to MS              Action/Plan:  PTA from home alone with family provided a lot of support.  Pt will likely need SNF - sister at bedside and pt is solement.  Pt is active with [facility] for [facility] and Aide.  CM requested PT/OT eval when medically stable.  CM will continue to follow for discharge needs   Expected Discharge Date:                  Expected Discharge Plan:  Skilled Nursing Facility  In-House Referral:  Clinical Social Work  Discharge planning Services  CM Consult  Post Acute Care Choice:    Choice offered to:     DME Arranged:    DME Agency:     HH Arranged:    HH Agency:     Status of Service:     If discussed at [employer], dates discussed:    Additional Comments: [facility] declined pt.  CM awaiting call from [facility]  [facility] will not be able to resume services as insurance is not longer in network with agency.  CM contacted [facility], [facility] and referral was declined.  CM left voicemail with [facility].  [facility] hospital can accept pt however can not provide [facility].    Pt is also active with [facility].  CM left voicemail for [facility] informing of admit Cherylann Parr, RN 12/11/2016, 8:41 AM

## 2016-12-11 NOTE — Progress Notes (Signed)
PCP visit: "Everything feels worse today."  States breathing worse.  Not eating.  Denies nausea, states that she is just not hungry.  No energy.  Level of consciousness seems better - she is alert and answers questions - but keeps her eyes closed and does not initiate any conversation. She strikes me as more withdrawn than somnolent.  States her spirits are good.  Medical: Continuing to treat for pneumonia even though the CXR and initial chest CT only suggestive.  It looks like the team is already doing my two medical suggestions: 1. Be more aggressive with diuresis 2. Cover with stress doses of steroids. By my review of today's orders, both already being done.  Psychologic/emotional/spritual:  Despite telling me her spirits are good, I get the impression that she is giving up.  Spoke with her sister outside the room, which reinforced this impression.  Sister tells me: 1. [name] discussed funeral arrangements including what to do with her pets yesterday. 2. Told sister that she had a dream yesterday that God was calling her.  She told God that she was not ready.  God replied, "That is not your decision." 3. Sister tells me that [name] knows that she cannot go back to her home and feels defeated by that knowledge.    So, we shall see.  Perhaps the steroids and diuresis will help turn the corner.  Perhaps she has begun an irreversible decline.

## 2016-12-11 NOTE — Discharge Summary (Signed)
Family Medicine Teaching [facility] Discharge Summary  Patient name: Barbara [name] Medical record number: [id number] Date of birth: June 19, 1955 Age: 61 y.o. Gender: female Date of Admission: 12/07/2016  Date of Discharge: 12/16/2012 Admitting Physician: Leighton Roach McDiarmid, MD  Primary Care Provider: Moses Manners, MD Consultants: Palliative, Opthomology  Indication for Hospitalization: Dysuria and Hypoxia  Discharge Diagnoses/Problem List:  -Multiple Sclerosis with spastic paraplegia -Chronic prednisone use -Indwelling chronic Foley -Left lower extremity cellulitis -HTN -T2DM -Pneumonia -OSH  Disposition: Home Hospice  Discharge Condition: Stable  Discharge Exam:  GEN: obese, chronically ill female lies in bed in NAD, immobile, pleasant CARD: RRR, difficult to assess with body habitus PULM: distant lung sounds throughout, no wheezing but seems more decreased on lower lobes ABD: obese, soft, nontender LE: wrapped, 1+ edema LE bil Psych: AAO x3, thought process linear  Brief Hospital Course:  Barbara [name] a 61 y.o.femalepresenting with dysuria and hypoxia. Labs on admission were significant for CBG 555, mild leukocytosis of 11.1, VBG with hypercarbia of 71.1/hypoxemia of 57.4/bicarbonate of 38.1/AB excess of 9.9, UA w/ hazy appearance/Glu >500/ketones 5/Large number of leuks/Many bacteria. CT Chest revealed LLL pneumonia and right perihilar ground glass opacity suspicious of infection. Patient was started on ceftriaxone. During hospital course, patient experienced increasing dyspnea and AMS. CT/MRI head were negative for acute changes. CXR revealed worsening PNA so antibiotic coverage was broadened to Zosyn/lenezolid. Patient did require stress dose steroids to maintain adequate blood pressures. Patient AMS did improve with new antibiotics, but dyspnea persisted. Dyspnea improved with diuresis, but patient required oxygen by nasal canula to maintain adequate oxygen  saturation. Blood cultures grew Bacteroides fragilis, concern for possible contamination. Repeat urine cultures were not concerning for UTI. Given steroid use, patient had greater insulin requirements to control her CBGs. Patient was transitioned to PO Levaquin. Patient was discharged in stable condition to home hospice.   Issues for Follow Up:  1. UTI prophylaxis - Please have patient restart appropriate UTI prophylaxis when she has complete her course of levaquin 2. Steroid taper - Patient oral prednisone was decreased to 15mg  POQD at discharge. Please follow up on the slow taper regimen outlined by Dr. Ottie Glazier.  3. Migraine with aura -  Patient was evaluated by opthalmology for visual disturbance of a flashing red cube. Opthalmology recommended outpatient follow up.   4. Home Oxygen - Patient was discharged with new requirement of home oxygen. Please assess respiratory status following discharge home.  5. Insulin requirements - Patient needed more aggressive insulin coverage while in hospital. Please follow up home CBGs to ensure adequate coverage.   Significant Procedures: None  Significant Labs and Imaging:   Recent Labs Lab 12/14/16 0409 12/15/16 0418 12/16/16 0511  WBC 8.0 10.3 10.7*  HGB 11.1* 11.5* 10.9*  HCT 39.3 39.1 37.7  PLT 199 229 249    Recent Labs Lab 12/10/16 0236  12/11/16 1248 12/12/16 0309 12/13/16 0259 12/14/16 0409 12/15/16 0418 12/16/16 0511  NA 136  < > 135 140 144 146* 143 144  K 3.6  < > 4.2 3.1* 3.0* 3.6 3.7 4.1  CL 90*  < > 89* 90* 93* 93* 92* 92*  CO2 36*  < > 35* 40* 41* 40* 42* 45*  GLUCOSE 340*  < > 194* 171* 155* 156* 115* 134*  BUN 19  < > 20 15 13 13 15 14   CREATININE 1.00  < > 0.96 0.95 0.92 1.10* 1.17* 1.32*  CALCIUM 8.2*  < > 8.2* 8.6* 8.5* 8.7* 8.8* 8.9  MG 1.8  --   --   --   --   --   --   --   PHOS 4.3  --   --   --   --   --   --   --   ALKPHOS  --   --  77  --   --   --   --   --   AST  --   --  71*  --   --   --   --   --    ALT  --   --  22  --   --   --   --   --   ALBUMIN  --   --  2.1*  --   --   --   --   --   < > = values in this interval not displayed.  Ct Angio Chest Pe W And/or Wo Contrast  Result Date: 12/07/2016 CLINICAL DATA:  Shortness of breath.  Suspected pulmonary embolism. EXAM: CT ANGIOGRAPHY CHEST WITH CONTRAST TECHNIQUE: Multidetector CT imaging of the chest was performed using the standard protocol during bolus administration of intravenous contrast. Multiplanar CT image reconstructions and MIPs were obtained to evaluate the vascular anatomy. CONTRAST:  100 cc Isovue 370 intravenous COMPARISON:  07/20/2016 FINDINGS: Cardiovascular: Pulmonary artery opacification is suboptimal due to bolus dispersion and patient size. There is reasonable visualization to the segmental level with no evidence of pulmonary embolism. Dilated main pulmonary artery at 43 mm, compatible with pulmonary hypertension. No acute aortic finding. Mild calcification at the aortic valve level. No notable atherosclerotic calcification. Mild thinning cardiomegaly. Mediastinum/Nodes: Mediastinal lipomatosis. Negative for adenopathy or mass. Lungs/Pleura: Low volume chest. There is asymmetric ground-glass opacity in the left lower lobe and right perihilar lung. Atelectatic type opacities that are subsegmental. Mosaic attenuation lungs which could be from small airway or small vessel disease. Upper Abdomen: No acute finding. Musculoskeletal: No acute or aggressive finding. Prominent spondylosis. Review of the MIP images confirms the above findings. IMPRESSION: 1. No evidence of pulmonary embolism. Sensitivity is diminished by patient body habitus. 2. Asymmetric airspace disease in the left lower lobe primarily suspicious for pneumonia. Right perihilar ground-glass opacity could also be infectious. 3. Enlarged pulmonary arteries as seen with pulmonary hypertension. 4. Mild atelectasis. Electronically Signed   By: Marnee Spring M.D.   On:  12/07/2016 15:30   Results/Tests Pending at Time of Discharge: None  Discharge Medications:  Allergies as of 12/16/2016      Reactions   Metformin And Related Diarrhea, Nausea And Vomiting   Vancomycin Rash   Significant "red man" like reaction on entire body that occurred several hours after Vancomycin infusion      Medication List    STOP taking these medications   gabapentin 600 MG tablet Commonly known as:  NEURONTIN Replaced by:  gabapentin 300 MG capsule     TAKE these medications   Apple Cider Vinegar 188 MG Caps Take 188 mg by mouth 2 (two) times daily.   aspirin EC 325 MG tablet Take 325 mg by mouth every evening.   CoQ10 100 MG Caps Take 100 mg by mouth every evening.   diazepam 5 MG tablet Commonly known as:  VALIUM TAKE 1 TABLET BY MOUTH EVERY 6 HOURS AS NEEDED FOR LEG SPASMS What changed:  how much to take  how to take this  when to take this  reasons to take this  additional instructions   furosemide 20  MG tablet Commonly known as:  LASIX Take 1 tablet (20 mg total) by mouth 2 (two) times daily. What changed:  medication strength  how much to take   gabapentin 300 MG capsule Commonly known as:  NEURONTIN Take 1 capsule (300 mg total) by mouth 3 (three) times daily. Replaces:  gabapentin 600 MG tablet   ibuprofen 400 MG tablet Commonly known as:  ADVIL,MOTRIN Take 1 tablet (400 mg total) by mouth every 6 (six) hours as needed for headache or moderate pain.   insulin aspart 100 UNIT/ML FlexPen Commonly known as:  NOVOLOG 10 units Morgan for every 100 mg/dL above fasting 741 mg/dl once in morning   Insulin Glargine 100 UNIT/ML Solostar Pen Commonly known as:  LANTUS Inject 10 Units into the skin daily at 10 pm. What changed:  how much to take  when to take this   levofloxacin 500 MG tablet Commonly known as:  LEVAQUIN Take 1 tablet (500 mg total) by mouth daily.   Magnesium 250 MG Tabs Take 250 mg by mouth every evening.    methocarbamol 500 MG tablet Commonly known as:  ROBAXIN Take 0.5 tablets (250 mg total) by mouth 3 (three) times daily as needed for muscle spasms. What changed:  medication strength  how much to take  when to take this  reasons to take this   multivitamin with minerals Tabs tablet Take 1 tablet by mouth every evening.   nitrofurantoin 50 MG capsule Commonly known as:  MACRODANTIN Take 1 capsule (50 mg total) by mouth 2 (two) times daily.   predniSONE 5 MG tablet Commonly known as:  DELTASONE Take 3 tablets (15 mg total) by mouth daily with breakfast. What changed:  medication strength  how much to take   traMADol 50 MG tablet Commonly known as:  ULTRAM Take 1 tablet (50 mg total) by mouth every 8 (eight) hours as needed. What changed:  See the new instructions.   Vitamin D3 1000 units Caps Take 4,000 Units by mouth every evening.     ASK your doctor about these medications   pneumococcal 23 valent vaccine 25 MCG/0.5ML injection Commonly known as:  PNU-IMMUNE Inject 0.5 mLs into the muscle tomorrow at 10 am. Ask about: Should I take this medication?       Discharge Instructions: Please refer to Patient Instructions section of EMR for full details.  Patient was counseled important signs and symptoms that should prompt return to medical care, changes in medications, dietary instructions, activity restrictions, and follow up appointments.   Follow-Up Appointments: Follow-up Information    Moses Manners, MD Follow up on 12/18/2016.   Specialty:  Family Medicine Why:  1:30pm Contact information: [address] [place] [address] [phone]           Garnette Gunner, MD 12/17/2016, 12:18 AM PGY-1, [facility]

## 2016-12-11 NOTE — Progress Notes (Signed)
Family Medicine Teaching Service Daily Progress Note Intern Pager: [phone]  Patient name: Barbara [name] Medical record number: [id number] Date of birth: 12-Mar-1956 Age: 61 y.o. Gender: female  Primary Care Provider: Moses Manners, MD Consultants: none Code Status: DNR/DNI  Pt Overview and Major Events to Date:  8/4 admitted with UTI and CAP  Assessment and Plan: Barbara [name] is a 61 y.o. 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 PMH is significant for multiple sclerosis with spastic paraplegia, chronic prednisone use, indwelling chronic Foley, left lower extremity cellulitis, HTN, T2DM.   Acute Respiratory Distress: o/n pt had O2 Sats in low 90%s, s/p duonebx2. Likely multifactorial, Pneumonia, fluid overload, OHS. 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 Given that she is immobile, and unlikely to trip over it, home oxygen may be a reasonable consideration for her to avoid future admissions. On 08/07, CXR - Bilateral airspace filling opacities, left greater than right suspicious for infectious process. -duoneb as needed -cont diuresis -cont-zosyn/linezolid -Consider home oxygen -Wean nasal cannula as tolerated -repeat CXR  Sepsis: Improved. Last VSS. Pt is alert. Neuro status improving.  Source PNA likely HCAP, given insignificant growth on urine cultures. Head CT/MRI negative for acute changes.   -reduce sedating meds as much as possible vs. Pain/muscle spasm control. -follow neuro exam CXR - Bilateral airspace filling opacities, left greater than right suspicious for infectious process. -Abx: IV Zosyn/linezolid started o/n 08/04,  -urine culture < 10K, insig growth -Blood cultures pending -MRI showed bilateral mastoid effusion, f/u w/ thorough HEENT exam  Edema. Pt has chronic edema in lower extremities.  Pt weight increased 5lbs in past 24hrs. Concern for excessive fluid 3rd spacing. -check Albumin -increase lasix to IV 20mg  q8h - daily weights  - monitor fluid status - strict I/Os    UTI: Insignificant growth on culture. Will need resumption of antibiotic prophylaxis for UTI when this acute course is completed. -Monitor I/Os -consider vaginal estrogen if persistent pain around foley site -restart UTI prophylaxis on d/c  Multiple Sclerosis with spastic paraplegia on chronic steroids: Currently taking 20mg  prednisone at home. Plan for very slow taper of prednisone made at recent home visit, refer to Dr. Deland Pretty note on 7/26, to decrease to 15 mg on 8/13.  - pt still experiencing pain,  - tramadol prn - hold diazepam - consider holding sedating meds based on solmolence  -Due to septis, pt given stress dose steroids, hydrocortisone 100 qd  HTN: Currently normotensive. On Lasix 40 mg twice a day at home.  - on lasix 20 mg BID, may increase if BP tolerates - hold if hypotensive  Hx of adrenal insufficiency: See plan for multiple sclerosis above.  -pt completed stress dose hydro cortisone -restart home prednisone 20mg  tomorrow  T2DM:Insulin regimen prior to arrival unclear, CBGs poorly controlled when home visit was conducted on 7/26. Unclear if this poor control is due to infection versus confusion about insulin regimen. Hypoglycemia at present, but no signs of acidosis. Will continue to chase blood sugars with short acting NovoLog.  -improvine, but still in 200-300s -increase lantus to 40 -10 units novolog with meals tid - SSI-resistant - CBG qACHS - Clarify insulin regimen with PCP prior to discharge  Goals of care: Patient has a long-standing relationship with PCP, [name]. Long discussion of goals of care at last admission Included completing and most  form. At this time, patient is DO NOT RESUSCITATE/DO NOT INTUBATE, does not desire ICU admission, does not desire  dialysis, does not desire feeding tube. She is amenable to short-term hospitalizations for IV fluids and antibiotics. -She notes that she is comfortable going home with oxygen if necessary -Continue to reevaluate goals of care  FEN/GI: carb mod diet Prophylaxis: lovenox  Disposition: watch, improving sepsis  Subjective:  Pt awake with mild miliase. Able to answer questions and cooperate with exam. Reports pain well controlled. Right sided numbness improved.  Objective: Temp:  [97.7 F (36.5 C)-99.1 F (37.3 C)] 97.7 F (36.5 C) (08/08 0230) Pulse Rate:  [74-87] 78 (08/08 0400) Resp:  [11-18] 12 (08/08 0400) BP: (91-116)/(61-85) 104/73 (08/08 0400) SpO2:  [92 %-98 %] 97 % (08/08 0400) Weight:  [355 lb (161 kg)-360 lb (163.3 kg)] 360 lb (163.3 kg) (08/08 0230) Physical Exam: General: alert, mildly flush, improved, Obese female with moon facies, able to answer questions HEENT: PERRLA, mildly flush faced Cardiovascular: nl s1/s2, no murmur Respiratory: CTAB, no increased work of breathing, no tongue deviation, no asymmetry in smille Abdomen: soft,nondistended, improved pitting edema Extremities: improved right arm edema, Bilateral lower extremities wrapped in [place], toes exposed,  Neuro: persistent right sided numbness in right arm, normal grip strength, 5/5 strength in upper extremities bilaterally  Laboratory:  Recent Labs Lab 12/09/16 0407 12/10/16 0236 12/11/16 0231  WBC 8.1 9.8 9.8  HGB 12.4 11.5* 11.2*  HCT 41.0 37.8 37.3  PLT 171 124* 142*    Recent Labs Lab 12/09/16 0407 12/10/16 0236 12/11/16 0231  NA 137 136 136  K 3.6 3.6 3.4*  CL 90* 90* 91*  CO2 36* 36* 34*  BUN 15 19 21*  CREATININE 1.06* 1.00 0.94  CALCIUM 9.0 8.2* 8.4*  GLUCOSE 307* 340* 259*   Lactic Acid, Venous    Component Value Date/Time   LATICACIDVEN 2.3 (HH) 12/09/2016 0407   Dg Chest 2 View  Result Date: 12/07/2016 CLINICAL DATA:  61 year old female with progressive hyperglycemia.  Shortness of breath, hypoxia. EXAM: CHEST  2 VIEW COMPARISON:  10/27/2016 and earlier. FINDINGS: Semi upright AP and lateral views of the chest. Stable lung volumes. Stable enlarged cardiac and mediastinal contours, seen by chest CTA on 07/20/2016 to be primarily due to mediastinal lipomatosis. Decreased pulmonary vascularity compared to 10/27/2016, with no acute edema suspected today. No pneumothorax, pleural effusion or consolidation. Prior cervical ACDF. IMPRESSION: 1. No acute cardiopulmonary abnormality. Regressed pulmonary vascularity compared to 10/27/2016. 2. Mediastinal lipomatosis. Electronically Signed   By: Odessa Fleming M.D.   On: 12/07/2016 14:09   Ct Angio Chest Pe W And/or Wo Contrast  Result Date: 12/07/2016 CLINICAL DATA:  Shortness of breath.  Suspected pulmonary embolism. EXAM: CT ANGIOGRAPHY CHEST WITH CONTRAST TECHNIQUE: Multidetector CT imaging of the chest was performed using the standard protocol during bolus administration of intravenous contrast. Multiplanar CT image reconstructions and MIPs were obtained to evaluate the vascular anatomy. CONTRAST:  100 cc Isovue 370 intravenous COMPARISON:  07/20/2016 FINDINGS: Cardiovascular: Pulmonary artery opacification is suboptimal due to bolus dispersion and patient size. There is reasonable visualization to the segmental level with no evidence of pulmonary embolism. Dilated main pulmonary artery at 43 mm, compatible with pulmonary hypertension. No acute aortic finding. Mild calcification at the aortic valve level. No notable atherosclerotic calcification. Mild thinning cardiomegaly. Mediastinum/Nodes: Mediastinal lipomatosis. Negative for adenopathy or mass. Lungs/Pleura: Low volume chest. There is asymmetric ground-glass opacity in the left lower lobe and right perihilar lung. Atelectatic type  opacities that are subsegmental. Mosaic attenuation lungs which could be from small airway or small vessel disease. Upper Abdomen: No acute finding.  Musculoskeletal: No acute or aggressive finding. Prominent spondylosis. Review of the MIP images confirms the above findings. IMPRESSION: 1. No evidence of pulmonary embolism. Sensitivity is diminished by patient body habitus. 2. Asymmetric airspace disease in the left lower lobe primarily suspicious for pneumonia. Right perihilar ground-glass opacity could also be infectious. 3. Enlarged pulmonary arteries as seen with pulmonary hypertension. 4. Mild atelectasis. Electronically Signed   By: Marnee Spring M.D.   On: 12/07/2016 15:30   Dg Chest Port 1 View  Result Date: 12/08/2016 CLINICAL DATA:  somnolence EXAM: PORTABLE CHEST 1 VIEW COMPARISON:  12/08/2016 FINDINGS: The heart is enlarged. There is airspace filling opacity in the left lower lobe, associated with air bronchograms. Similar findings are identified at the medial right lung base. Suspect left pleural effusion. IMPRESSION: 1. Cardiomegaly. 2. Bilateral airspace filling opacities, left greater than right suspicious for infectious process. Electronically Signed   By: Norva Pavlov M.D.   On: 12/08/2016 20:18   Dg Chest Port 1 View  Result Date: 12/08/2016 CLINICAL DATA:  61 year old female with shortness of breath. EXAM: PORTABLE CHEST 1 VIEW COMPARISON:  Chest CT dated 12/07/2016 FINDINGS: There is bilateral central vascular prominence likely mild vascular congestion. Asymmetric airspace density primarily a left lung base with silhouetting of the left hemidiaphragm concerning for pneumonia. There is probable small left pleural effusion as seen on the prior CT. No pneumothorax. The cardiac borders are silhouetted. Lower cervical spine ACDF. No acute osseous pathology. IMPRESSION: 1. Left lung base opacity with silhouetting of the left hemidiaphragm concerning for pneumonia. Clinical correlation and follow-up recommended. 2. Probable mild vascular congestion. Electronically Signed   By: Elgie Collard M.D.   On: 12/08/2016 04:00    Garnette Gunner, MD 12/11/2016, 6:40 AM PGY-2, [place] [facility] FPTS Intern pager: [phone], text pages welcome

## 2016-12-12 LAB — BASIC METABOLIC PANEL
Anion gap: 10 (ref 5–15)
BUN: 15 mg/dL (ref 6–20)
CHLORIDE: 90 mmol/L — AB (ref 101–111)
CO2: 40 mmol/L — ABNORMAL HIGH (ref 22–32)
Calcium: 8.6 mg/dL — ABNORMAL LOW (ref 8.9–10.3)
Creatinine, Ser: 0.95 mg/dL (ref 0.44–1.00)
GFR calc Af Amer: >60 mL/min (ref >60)
GFR calc non Af Amer: >60 mL/min (ref >60)
GLUCOSE: 171 mg/dL — AB (ref 65–99)
Potassium: 3.1 mmol/L — ABNORMAL LOW (ref 3.5–5.1)
Sodium: 140 mmol/L (ref 135–145)

## 2016-12-12 LAB — CBC
HCT: 38.6 % (ref 36.0–46.0)
HEMOGLOBIN: 11.3 g/dL — AB (ref 12.0–15.0)
MCH: 25.6 pg — AB (ref 26.0–34.0)
MCHC: 29.3 g/dL — ABNORMAL LOW (ref 30.0–36.0)
MCV: 87.5 fL (ref 78.0–100.0)
Platelets: 160 10*3/uL (ref 150–400)
RBC: 4.41 MIL/uL (ref 3.87–5.11)
RDW: 18.6 % — ABNORMAL HIGH (ref 11.5–15.5)
WBC: 7 10*3/uL (ref 4.0–10.5)

## 2016-12-12 LAB — GLUCOSE, CAPILLARY
GLUCOSE-CAPILLARY: 170 mg/dL — AB (ref 65–99)
GLUCOSE-CAPILLARY: 208 mg/dL — AB (ref 65–99)
Glucose-Capillary: 120 mg/dL — ABNORMAL HIGH (ref 65–99)
Glucose-Capillary: 261 mg/dL — ABNORMAL HIGH (ref 65–99)

## 2016-12-12 MED ORDER — FUROSEMIDE 10 MG/ML IJ SOLN
40.0000 mg | Freq: Three times a day (TID) | INTRAMUSCULAR | Status: DC
  Administered 2016-12-12 – 2016-12-14 (×6): 40 mg via INTRAVENOUS
  Filled 2016-12-12 (×6): qty 4

## 2016-12-12 MED ORDER — PREDNISONE 20 MG PO TABS
20.0000 mg | ORAL_TABLET | Freq: Every day | ORAL | Status: DC
  Administered 2016-12-13 – 2016-12-16 (×4): 20 mg via ORAL
  Filled 2016-12-12 (×4): qty 1

## 2016-12-12 MED ORDER — DIAZEPAM 5 MG PO TABS: 5.0000 mg | ORAL_TABLET | Freq: Four times a day (QID) | ORAL | Status: DC | PRN

## 2016-12-12 MED ORDER — POTASSIUM CHLORIDE CRYS ER 20 MEQ PO TBCR
40.0000 meq | EXTENDED_RELEASE_TABLET | Freq: Once | ORAL | Status: AC
  Administered 2016-12-12: 40 meq via ORAL
  Filled 2016-12-12: qty 2

## 2016-12-12 MED ORDER — GLUCERNA SHAKE PO LIQD
237.0000 mL | Freq: Three times a day (TID) | ORAL | Status: DC
  Administered 2016-12-12 – 2016-12-15 (×5): 237 mL via ORAL

## 2016-12-12 MED ORDER — ENOXAPARIN SODIUM 80 MG/0.8ML ~~LOC~~ SOLN
80.0000 mg | SUBCUTANEOUS | Status: DC
  Administered 2016-12-12 – 2016-12-15 (×4): 80 mg via SUBCUTANEOUS
  Filled 2016-12-12 (×4): qty 0.8

## 2016-12-12 MED ORDER — DIAZEPAM 5 MG PO TABS
5.0000 mg | ORAL_TABLET | Freq: Four times a day (QID) | ORAL | Status: DC | PRN
  Administered 2016-12-13 – 2016-12-16 (×5): 5 mg via ORAL
  Filled 2016-12-12 (×5): qty 1

## 2016-12-12 MED ORDER — INSULIN GLARGINE 100 UNIT/ML ~~LOC~~ SOLN
30.0000 [IU] | Freq: Every day | SUBCUTANEOUS | Status: DC
  Administered 2016-12-12: 30 [IU] via SUBCUTANEOUS
  Filled 2016-12-12: qty 0.3

## 2016-12-12 NOTE — Evaluation (Signed)
Occupational Therapy Evaluation and Discharge Patient Details Name: Barbara [name] MRN: [id number] DOB: 1955/05/19 Today's Date: 12/12/2016    History of Present Illness Pt admitted with bladder catheter infection and hypoxia. PMH: indwelling foley due to neurogenic bladder, MS, HTN, DM, L LE cellulitis.   Clinical Impression   Pt is functioning at her baseline. No OT needs.   Follow Up Recommendations  No OT follow up;Supervision/Assistance - 24 hour    Equipment Recommendations       Recommendations for Other Services       Precautions / Restrictions Precautions Precautions: Fall Restrictions Weight Bearing Restrictions: No      Mobility Bed Mobility Overal bed mobility: Needs Assistance Bed Mobility: Rolling;Supine to Sit;Sit to Supine Rolling: Total assist;+2 for physical assistance;+2 for safety/equipment   Supine to sit: Total assist;+2 for physical assistance Sit to supine: Total assist;+2 for physical assistance   General bed mobility comments: pt assisted with UE's and trunk, though still needed significant 2 person assist  Transfers                 General transfer comment: Did not transfer OOB, pt transfers with hoyer only at this point.    Balance Overall balance assessment: Needs assistance Sitting-balance support: Single extremity supported;Bilateral upper extremity supported Sitting balance-Leahy Scale: Poor Sitting balance - Comments: pt sat 15-20 min at EOB.  Initially needing mod assist, but worked toward min guard for short time.  pt unable to acept any challenge without falling backward.                                   ADL either performed or assessed with clinical judgement   ADL Overall ADL's : At baseline                                             Vision Baseline Vision/History: Legally blind (R blind) Patient Visual Report: No change from baseline       Perception     Praxis       Pertinent Vitals/Pain Pain Assessment: Faces Faces Pain Scale: Hurts a little bit Pain Location: vague Pain Descriptors / Indicators: Discomfort Pain Intervention(s): Monitored during session     Hand Dominance Left   Extremity/Trunk Assessment Upper Extremity Assessment Upper Extremity Assessment: RUE deficits/detail RUE Deficits / Details: hx of RCT, full AROM in supine, 4/5 otherwise RUE Coordination: decreased gross motor   Lower Extremity Assessment Lower Extremity Assessment: RLE deficits/detail;LLE deficits/detail RLE Deficits / Details: No voluntary or spontaneous mvmt.  decreased to touch and pressure RLE Sensation: decreased light touch RLE Coordination: decreased gross motor;decreased fine motor LLE Deficits / Details: no voluntary or spontaneous mvmt.  Decrease sensation /light touch and pressure. LLE Sensation: decreased light touch LLE Coordination: decreased fine motor;decreased gross motor   Cervical / Trunk Assessment Cervical / Trunk Assessment: Other exceptions Cervical / Trunk Exceptions: hx of spinal stenosis and degenerative disease   Communication Communication Communication: No difficulties   Cognition Arousal/Alertness: Awake/alert Behavior During Therapy: WFL for tasks assessed/performed Overall Cognitive Status: Within Functional Limits for tasks assessed                                     General Comments  Vitals stable on O2, but on RA pt's SpO2 slowly dropped into the mid 80's, so reapplied.    Exercises    Shoulder Instructions      Home Living Family/patient expects to be discharged to:: Private residence Living Arrangements: Alone Available Help at Discharge: Neighbor;Available PRN/intermittently;Family;Friend(s) Type of Home: House Home Access: Other (comment) (hydraulic lift)     Home Layout: One level     Bathroom Shower/Tub: Chief Strategy Officer: Handicapped height Bathroom Accessibility:  No   Home Equipment: Grab bars - toilet;Grab bars - tub/shower;Wheelchair - [employer] - 2 wheels;Tub bench;Other (comment);Wheelchair - power;Toilet riser;Adaptive equipment;Hospital bed (hoyer lift) Adaptive Equipment: Reacher;Sock aid;Long-handled sponge Additional Comments: cannot get to bathroom      Prior Functioning/Environment Level of Independence: Needs assistance  Gait / Transfers Assistance Needed: hoyer lift transfers with family/neighbors come ADL's / Homemaking Assistance Needed: pt empties her foley catheter, assist for ADL, meals and housekeeping   Comments: Has groceries delivered. Rarely leaves her home.        OT Problem List: Decreased activity tolerance;Impaired balance (sitting and/or standing);Decreased strength;Decreased safety awareness;Decreased knowledge of use of DME or AE;Decreased coordination;Obesity;Impaired UE functional use;Pain      OT Treatment/Interventions:      OT Goals(Current goals can be found in the care plan section) Acute Rehab OT Goals Patient Stated Goal: get home to be able to get my affairs/home in order  OT Frequency:     Barriers to D/C:            Co-evaluation              AM-PAC PT "6 Clicks" Daily Activity     Outcome Measure Help from another person eating meals?: None Help from another person taking care of personal grooming?: A Little Help from another person toileting, which includes using toliet, bedpan, or urinal?: Total Help from another person bathing (including washing, rinsing, drying)?: A Lot Help from another person to put on and taking off regular upper body clothing?: A Lot Help from another person to put on and taking off regular lower body clothing?: Total 6 Click Score: 13   End of Session Equipment Utilized During Treatment: Oxygen  Activity Tolerance: Patient tolerated treatment well Patient left: in bed;with call bell/phone within reach;with family/visitor present  OT Visit Diagnosis:  Muscle weakness (generalized) (M62.81)                Time: 1224-1310 OT Time Calculation (min): 46 min Charges:  OT General Charges $OT Visit: 1 Procedure OT Evaluation $OT Eval Moderate Complexity: 1 Procedure G-Codes:     Evern Bio 12/12/2016, 2:31 PM  [phone]

## 2016-12-12 NOTE — Consult Note (Signed)
CC:  Chief Complaint  Patient presents with  . Hyperglycemia    HPI: Barbara [name] is a 61 y.o. female w/ POH of MS c/b optic neuritis OD and PMH below who presents for evaluation of blocks in vision. Patient admitted for multiple medical problems - see FM HPI. Patient reports 1 week of seeing spots in the left eye. Describes it as seeing blocks like back of an index card. + headaches afterwards. No flashes. No floaters. + phonophobia but no photophobia. Vision progressively worse. On chronic PO Prednisone.   ROS: + visual disturbance, + headache, + phonophobia  PMH: Past Medical History:  Diagnosis Date  . Absence of bladder continence   . 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 Cellulitis, leg 09/24/2016  . Cervical myelopathy (HCC) 08/07/2011  . Chronic gout 12/07/2010   Infrequent flairs   . Chronic indwelling Foley catheter 11/29/2016  . Chronic venous insufficiency 11/05/2012  . Current chronic use of systemic steroids 03/07/2016   No shingles vaccine  . Decubitus ulcer of sacral region   . Diabetes mellitus without complication (HCC)   . DM type 2, not at goal [facility]) 03/06/2016  . Essential hypertension   . Gout   . Hypertension   . Hypoxia   . Impaired mobility and activities of daily living 03/07/2016  . Lower back pain   . Lower extremity weakness 04/02/2016  . Lumbar spondylosis   . Morbid obesity (HCC) 12/07/2010   Has always been large.  Since MS has been trying and partially successful at wt loss.     . MS (multiple sclerosis) (HCC)   . Multiple sclerosis (HCC) 12/07/2010   Diagnosed 05/2010.  Managed by Dr. Sandria Manly   . Numbness   . Shortness of breath   . Spastic paraplegia   . Spinal stenosis   . Spinal stenosis in cervical region 06/03/2011    [facility] (Dr. Timoteo Ace) anterior discectomies and fusion at C5-6,6-7 & 7-1 done on 06/14/11.    Marland Kitchen Spinal stenosis of lumbar region 12/07/2010   Chronic low back pain.  Left leg weak but unclear if due to MS or spinal stenosis.  Pain well controled  by tramadol.    Now (Jan 2013)  Managed  [facility], who plans at a later date lumbar laminectomy L2-4.   Marland Kitchen Weakness   . Weakness of both lower extremities     PSH: Past Surgical History:  Procedure Laterality Date  . CERVICAL DISC SURGERY     Qusetions C5-6 C6-7 and C7-T1    Meds: No current facility-administered medications on file prior to encounter.    Current Outpatient Prescriptions on File Prior to Encounter  Medication Sig Dispense Refill  . Apple Cider Vinegar 188 MG CAPS Take 188 mg by mouth 2 (two) times daily.     Marland Kitchen aspirin EC 325 MG tablet Take 325 mg by mouth every evening.     . Cholecalciferol (VITAMIN D3) 1000 units CAPS Take 4,000 Units by mouth every evening.     . Coenzyme Q10 (COQ10) 100 MG CAPS Take 100 mg by mouth every evening.    . diazepam (VALIUM) 5 MG tablet TAKE 1 TABLET BY MOUTH EVERY 6 HOURS AS NEEDED FOR LEG SPASMS (Patient taking differently: Take 5 mg by mouth every 6 (six) hours as needed for muscle spasms. ) 90 tablet 5  . furosemide (LASIX) 40 MG tablet Take 1 tablet (40 mg total) by mouth 2 (two) times daily. 180 tablet 3  . gabapentin (NEURONTIN) 600  MG tablet Take 1 tablet (600 mg total) by mouth 3 (three) times daily. 180 tablet 2  . insulin aspart (NOVOLOG) 100 UNIT/ML FlexPen 10 units Theresa for every 100 mg/dL above fasting 409 mg/dl once in morning 15 mL 11  . Insulin Glargine (LANTUS) 100 UNIT/ML Solostar Pen Inject 10 Units into the skin daily at 10 pm. (Patient taking differently: Inject 13 Units into the skin at bedtime. ) 15 mL 11  . Magnesium 250 MG TABS Take 250 mg by mouth every evening.     . methocarbamol (ROBAXIN) 750 MG tablet TAKE 2 TABLETS (1,500 MG TOTAL) BY MOUTH 3 (THREE) TIMES DAILY. 540 tablet 3  . Multiple Vitamin (MULTIVITAMIN WITH MINERALS) TABS tablet Take 1 tablet by mouth every evening.    . nitrofurantoin (MACRODANTIN) 50 MG capsule Take 1 capsule (50 mg total) by mouth 2 (two) times daily. 60 capsule 12  . predniSONE  (DELTASONE) 20 MG tablet Take 20 mg by mouth daily with breakfast.     . traMADol (ULTRAM) 50 MG tablet TAKE 1 TABLET BY MOUTH 4 TIMES DAILY 360 tablet 1  . ibuprofen (ADVIL,MOTRIN) 400 MG tablet Take 1 tablet (400 mg total) by mouth every 6 (six) hours as needed for headache or moderate pain. (Patient not taking: Reported on 12/07/2016) 30 tablet 0    SH: Social History   Social History  . Marital status: Widowed    Spouse name: N/A  . Number of children: 0  . Years of education: 23   Occupational History  .  [employer]   Social History Main Topics  . Smoking status: Never Smoker  . Smokeless tobacco: Never Used  . Alcohol use No  . Drug use: No  . Sexual activity: Not Currently    Birth control/ protection: Abstinence     Comment: husband died 2010-11-12   Other Topics Concern  . None   Social History Narrative   Patient lives at home alone.   Caffeine Use: 1 cup daily   Pet: Dog, boxer    FH: Family History  Problem Relation Age of Onset  . Heart disease Mother   . Diabetes Mother   . Cancer Father     Exam:  Zenaida Niece: OD: 20/200 equivalent OS: 20/200 equivalent  CVF: OD: full OS: full  EOM: OD: full d/v OS: full d/v  Pupils: OD: 4->11mm, + APD OS: 4 >3 mm, no APD  IOP: by [place] OD: 17 OS: 19  External: OD: no periorbital edema, no proptosis, good orbicularis strength OS: no periorbital edema, no proptosis, good orbicularis strength   Pen Light Exam: L/L: OD: WNL OS: WNL  C/S: OD: white and quiet OS: white and quiet  K: OD: clear, no abnormal staining OS: clear, no abnormal staining  A/C: OD: grossly deep and quiet appearing by pen light OS: grossly deep and quiet appearing by pen light  I: OD: round and regular OS: round and regular  L: OD: NSC OS: NSC  DFE: dilated @ 6:00 PM w/ Tropic and Phenyl OU  V: OD: clear OS: clear  N: OD: C/D 0.2, no disc edema OS: C/D 0.2, no disc edema  M: OD: flat, no obvious macular  pathology OS: flat, no obvious macular pathology  V: OD: normal appearing vessels OS: normal appearing vessels  P: OD: retina flat 360, no obvious mass/RT/RD OS: retina flat 360, no obvious mass/RT/RD  A/P:  1. Migraine w/ Aura: - No evidence of a retinal tear or detachment -  Suspect migraine w/ aura - No intervention indicated from an ophthalmologic standpoint  2. MS w/ h/o Optic Neuritis: - Most recent MRI stable  3. Cataracts OU: - Unable to adequately assess degree of visual impairment but given chronic PO Prednisone likely visually significant - Recommend outpatient follow-up.  Dispo: - Outpatient FU PRN  Christopher T. Sherryll Burger, MD [facility]  Office: [phone]

## 2016-12-12 NOTE — Care Management Note (Signed)
Case Management Note  Patient Details  Name: Barbara [name] MRN: [id number] Date of Birth: [birth year]  Subjective/Objective:      Pt admitted with bladder infection - pt with indwelling foley from home due to neurogenic bladder secondary to MS              Action/Plan:  PTA from home alone with family provided a lot of support.  Pt will likely need SNF - sister at bedside and pt is solement.  Pt is active with [facility] for [facility] and Aide.  CM requested PT/OT eval when medically stable.  CM will continue to follow for discharge needs   Expected Discharge Date:                  Expected Discharge Plan:  Skilled Nursing Facility  In-House Referral:  Clinical Social Work  Discharge planning Services  CM Consult  Post Acute Care Choice:    Choice offered to:     DME Arranged:    DME Agency:     HH Arranged:    HH Agency:     Status of Service:     If discussed at [employer], dates discussed:    Additional Comments: 12/12/2016 Discussed in LOS 8/9 - pt remains appropriate for continued stay.  Pt referred by Physician Advisor to [facility] for [facility] program - referral was accepted during LOS meeting.  12/11/16 [facility] declined [facility].  CM awaiting call from [facility]  [facility] will not be able to resume services as insurance is not longer in network with agency.  CM contacted [facility], [facility] and referral was declined.  CM left voicemail with [facility].  [facility] can accept pt however can not provide [facility].    Pt is also active with [facility].  CM left voicemail for [employer] informing of admit Cherylann Parr, RN 12/12/2016, 8:39 AM

## 2016-12-12 NOTE — Progress Notes (Signed)
Nutrition Follow-up  DOCUMENTATION CODES:   Morbid obesity  INTERVENTION:   -D/c 30 ml Protstat TID -Glucerna Shake po TID, each supplement provides 220 kcal and 10 grams of protein -Continue MVI  NUTRITION DIAGNOSIS:   Increased nutrient needs related to wound healing as evidenced by estimated needs.  Ongoing  GOAL:   Patient will meet greater than or equal to 90% of their needs  Unmet  MONITOR:   PO intake, Supplement acceptance, Labs, Weight trends, Skin, I & O's  REASON FOR ASSESSMENT:   Low Braden    ASSESSMENT:   Barbara [name] is a 61 y.o. 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 PMH is significant for multiple sclerosis with spastic paraplegia, chronic prednisone use, indwelling chronic Foley, left lower extremity cellulitis, HTN, T2DM.   Pt very somnolent at time of visit. No family members present.   Reviewed MD notes; ongoing discussion with MD regarding goals of care. Pt is currently DNR/DNI, with wishes to avoid ICU admission, HD, and feeding tube. Palliative care consult pending.   Meal completion remains poor; noted 0-50% per doc flowsheets. Pt is refusing Prostat supplement.   Labs reviewed: CBGS: 120-253, K: 3.1.   Diet Order:  Diet Carb Modified Fluid consistency: Thin; Room service appropriate? Yes  Skin:   (open lt thigh incision)  Last BM:  12/11/16  Height:   Ht Readings from Last 1 Encounters:  12/09/16 5\' 3"  (1.6 m)    Weight:   Wt Readings from Last 1 Encounters:  12/12/16 (!) 359 lb 2.1 oz (162.9 kg)    Ideal Body Weight:  47 kg  BMI:  Body mass index is 63.62 kg/m.  Estimated Nutritional Needs:   Kcal:  1600-1800  Protein:  115-130 grams  Fluid:  >1.6 L  EDUCATION NEEDS:   Education needs no appropriate at this time  [place] Greb A. Mayford Knife, RD, LDN, CDE Pager: [phone] After hours Pager: [phone]

## 2016-12-12 NOTE — Evaluation (Signed)
Physical Therapy Evaluation Patient Details Name: Barbara [name] MRN: [id number] DOB: [birth year] Today's Date: 12/12/2016   History of Present Illness  Pt admitted with bladder catheter infection and hypoxia. PMH: indwelling foley due to neurogenic bladder, MS, HTN, DM, L LE cellulitis.  Clinical Impression  Pt admitted with/for complication stated above.  Pt is likely closing in on her baseline, but can work toward sitting EOB for ADL assist.  Presently needing 2 person total assist.  Pt currently limited functionally due to the problems listed below.  (see problems list.)  Pt will benefit from PT to maximize function and safety to be able to get home safely with available assist of family/friends.     Follow Up Recommendations No PT follow up;Supervision/Assistance - 24 hour    Equipment Recommendations  None recommended by PT    Recommendations for Other Services       Precautions / Restrictions Precautions Precautions: Fall Restrictions Weight Bearing Restrictions: No      Mobility  Bed Mobility Overal bed mobility: Needs Assistance Bed Mobility: Rolling;Supine to Sit;Sit to Supine Rolling: Total assist;+2 for physical assistance;+2 for safety/equipment   Supine to sit: Total assist;+2 for physical assistance Sit to supine: Total assist;+2 for physical assistance   General bed mobility comments: pt assisted with UE's and trunk, though still needed significant 2 person assist  Transfers                 General transfer comment: Did not transfer OOB, pt transfers with hoyer only at this point.  Ambulation/Gait                Stairs            Wheelchair Mobility    Modified Rankin (Stroke Patients Only)       Balance Overall balance assessment: Needs assistance Sitting-balance support: Single extremity supported;Bilateral upper extremity supported Sitting balance-Leahy Scale: Poor Sitting balance - Comments: pt sat 15-20 min at EOB.   Initially needing mod assist, but worked toward min guard for short time.  pt unable to acept any challenge without falling backward.                                     Pertinent Vitals/Pain Pain Assessment: Faces Faces Pain Scale: Hurts a little bit Pain Location: vague Pain Descriptors / Indicators: Discomfort Pain Intervention(s): Monitored during session    Home Living Family/patient expects to be discharged to:: Private residence Living Arrangements: Alone Available Help at Discharge: Neighbor;Available PRN/intermittently;Family;Friend(s) Type of Home: House Home Access: Other (comment) (hydraulic lift)     Home Layout: One level Home Equipment: Grab bars - toilet;Grab bars - tub/shower;Wheelchair - [employer] - 2 wheels;Tub bench;Other (comment);Wheelchair - power;Toilet riser;Adaptive equipment;Hospital bed (hoyer lift) Additional Comments: cannot get to bathroom    Prior Function Level of Independence: Needs assistance   Gait / Transfers Assistance Needed: hoyer lift transfers with family/neighbors come  ADL's / Homemaking Assistance Needed: pt empties her foley catheter, assist for ADL, meals and housekeeping  Comments: Has groceries delivered. Rarely leaves her home.     Hand Dominance   Dominant Hand: Left    Extremity/Trunk Assessment   Upper Extremity Assessment RUE Deficits / Details: hx of RCT, full AROM in supine, 4/5 otherwise RUE Coordination: decreased gross motor    Lower Extremity Assessment Lower Extremity Assessment: RLE deficits/detail;LLE deficits/detail RLE Deficits / Details: No voluntary or spontaneous mvmt.  decreased to touch and pressure RLE Sensation: decreased light touch RLE Coordination: decreased gross motor;decreased fine motor LLE Deficits / Details: no voluntary or spontaneous mvmt.  Decrease sensation /light touch and pressure. LLE Sensation: decreased light touch LLE Coordination: decreased fine  motor;decreased gross motor    Cervical / Trunk Assessment Cervical / Trunk Assessment: Other exceptions Cervical / Trunk Exceptions: hx of spinal stenosis and degenerative disease  Communication   Communication: No difficulties  Cognition Arousal/Alertness: Awake/alert Behavior During Therapy: WFL for tasks assessed/performed Overall Cognitive Status: Within Functional Limits for tasks assessed                                        General Comments General comments (skin integrity, edema, etc.): Vitals stable on O2, but on RA pt's SpO2 slowly dropped into the mid 80's, so reapplied.    Exercises     Assessment/Plan    PT Assessment Patient needs continued PT services  PT Problem List Decreased strength;Decreased activity tolerance;Decreased balance;Decreased mobility;Decreased coordination;Cardiopulmonary status limiting activity       PT Treatment Interventions Functional mobility training;Therapeutic activities;Therapeutic exercise;Balance training;Patient/family education    PT Goals (Current goals can be found in the Care Plan section)  Acute Rehab PT Goals Patient Stated Goal: get home to be able to get my affairs/home in order PT Goal Formulation: With patient Time For Goal Achievement: 12/19/16 Potential to Achieve Goals: Good    Frequency Min 3X/week   Barriers to discharge        Co-evaluation               AM-PAC PT "6 Clicks" Daily Activity  Outcome Measure Difficulty turning over in bed (including adjusting bedclothes, sheets and blankets)?: Total Difficulty moving from lying on back to sitting on the side of the bed? : Total Difficulty sitting down on and standing up from a chair with arms (e.g., wheelchair, bedside commode, etc,.)?: Total Help needed moving to and from a bed to chair (including a wheelchair)?: Total Help needed walking in hospital room?: Total Help needed climbing 3-5 steps with a railing? : Total 6 Click  Score: 6    End of Session Equipment Utilized During Treatment: Oxygen Activity Tolerance: Patient tolerated treatment well;Patient limited by fatigue Patient left: in bed;with call bell/phone within reach;with bed alarm set Nurse Communication: Mobility status PT Visit Diagnosis: Other abnormalities of gait and mobility (R26.89);Muscle weakness (generalized) (M62.81);Other symptoms and signs involving the nervous system (R29.898)    Time: 1225-1310 PT Time Calculation (min) (ACUTE ONLY): 45 min   Charges:   PT Evaluation $PT Eval High Complexity: 1 High PT Treatments $Therapeutic Activity: 8-22 mins   PT G Codes:        12/28/2016  [place] Bing, PT [phone]  (pager)  Eliseo Gum Gizella [place] 2016-12-28, 4:17 PM

## 2016-12-12 NOTE — Progress Notes (Signed)
Orthopedic Tech Progress Note Patient Details:  Barbara [name] 1955-07-04 [id number]  Ortho Devices Type of Ortho Device: Roland Rack boot Ortho Device/Splint Location: bilateral Ortho Device/Splint Interventions: Application   Khristine Verno 12/12/2016, 2:02 PM

## 2016-12-12 NOTE — Progress Notes (Signed)
Family Medicine Teaching Service Daily Progress Note Intern Pager: [phone]  Patient name: Barbara [name] Medical record number: [id number] Date of birth: [birth year] Age: 61 y.o. Gender: female  Primary Care Provider: Moses Manners, MD Consultants: none Code Status: DNR/DNI  Pt Overview and Major Events to Date:  8/4 admitted with UTI and CAP  Assessment and Plan: Barbara [name] is a 61 y.o. 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 PMH is significant for multiple sclerosis with spastic paraplegia, chronic prednisone use, indwelling chronic Foley, left lower extremity cellulitis, HTN, T2DM.   Visual disturbance: Pt complaining a red "cube" that appears in her left lower. She states that she has been experiencing over the past week, but is worse today. The red cube will flash multiple times in a row then disappears. It last very briefly, less than a second. It is not associated with any pain. Pt endorses left sided headache. It is not worse when the flashes occur. She denies blurry vision in her left eye. Endorses Chronic blurry vision in her right eye. Bedside eye exam showed decreased vision in the left lower field. DDx includes retinal detachment, MS flair of occular nerve, left sided migraine with aura. Recent head MRI neg for acute intracranial change. Migraine seems less likely given the focal/repetative nature of the desturbance.  -talk w/ patient about further workup/tx of retinal detachment, if she wishes to pursue further, may consult opthomology  Acute Respiratory Distress: Improved.Satting 94% on 4L O2. Likely multifactorial, Pneumonia, fluid overload, OHS. 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 Given that she is immobile, and unlikely to trip over it, home oxygen may be a reasonable consideration for her to  avoid future admissions. On 08/07, CXR - Bilateral airspace filling opacities, left greater than right suspicious for infectious process. -duoneb as needed -cont diuresis -cont-zosyn/linezolid -Consider home oxygen -Wean nasal cannula as tolerated  Sepsis: Improved. Last VSS. Pt is alert. Neuro status improving.  Source PNA likely HCAP, given insignificant growth on urine cultures. Head CT/MRI negative for acute changes.   -reduce sedating meds as much as possible vs. Pain/muscle spasm control. -follow neuro exam -CXR - Bilateral airspace filling opacities, left greater than right suspicious for infectious process. -Abx: IV Zosyn/linezolid started o/n 08/04,  -urine culture < 10K, insig growth -Blood cultures, Gram Variable Rods, cont. Broad spectrum -MRI showed bilateral mastoid effusion, HEENT was normal  Edema. Pt has chronic edema in lower extremities. Pt weight increased 5lbs in past 24hrs. Concern for excessive fluid 3rd spacing. Albumin wnl -down 1lb in past 24hrs, cont. diuresis -lasix to IV 20mg  q8h - daily weights  - monitor fluid status - strict I/Os    UTI: Insignificant growth on culture. Will need resumption of antibiotic prophylaxis for UTI when this acute course is completed. -Monitor I/Os -consider vaginal estrogen if persistent pain around foley site -restart UTI prophylaxis on d/c  Multiple Sclerosis with spastic paraplegia on chronic steroids: Currently taking 20mg  prednisone at home. Plan for very slow taper of prednisone made at recent home visit, refer to Barbara [name] note on 7/26, to decrease to 15 mg on 8/13.  - pt still experiencing pain,  - tramadol prn - hold diazepam - consider holding sedating meds based on solmolence  -Due to septis, pt given stress dose steroids, hydrocortisone 100 qd  HTN: Currently normotensive. On Lasix 40  mg twice a day at home.  - on lasix 20 IV tid,  - hold if hypotensive  Hx of adrenal insufficiency: See plan for  multiple sclerosis above.  -stress dose hydrocortisone, started 8/07,  -pt VS improved, consider d/c stress dose and restart home oral pred 20mg   Hypokalemia:  -replete per protocol  T2DM:Insulin regimen prior to arrival unclear, CBGs poorly controlled when home visit was conducted on 7/26. Unclear if this poor control is due to infection versus confusion about insulin regimen. Hypoglycemia at present, but no signs of acidosis. Will continue to chase blood sugars with short acting NovoLog.  -poor po -improvine, but still in 200s -increase lantus to 40 -5 units novolog with meals tid - SSI-resistant - CBG qACHS - Clarify insulin regimen with PCP prior to discharge  Goals of care: Patient has a long-standing relationship with PCP, Barbara [name]. Long discussion of goals of care at last admission Included completing and most form. At this time, patient is DO NOT RESUSCITATE/DO NOT INTUBATE, does not desire ICU admission, does not desire dialysis, does not desire feeding tube. She is amenable to short-term hospitalizations for IV fluids and antibiotics. -She notes that she is comfortable going home with oxygen if necessary -Continue to reevaluate goals of care -pt is wishing to discuss distribution of her belongings, and how to arrange her care/funeral when she is discharged home.  -appreciate palliative consult   FEN/GI: carb mod diet Prophylaxis: lovenox  Disposition: watch, improving sepsis  Subjective:  Pt awake, much more alert. Persistent numbness, Right sided numbness improved.  Objective: Temp:  [97.5 F (36.4 C)-99.1 F (37.3 C)] 98.3 F (36.8 C) (08/09 0342) Pulse Rate:  [70-81] 78 (08/09 0342) Resp:  [14-20] 17 (08/09 0342) BP: (109-124)/(76-93) 124/93 (08/09 0342) SpO2:  [95 %-98 %] 98 % (08/09 0342) Weight:  [359 lb 2.1 oz (162.9 kg)] 359 lb 2.1 oz (162.9 kg) (08/09 0500) Physical Exam: General: alert, mildly flush, improved, Obese female with moon facies, able to  answer questions HEENT: PERRLA, mildly flush faced, no pain w/ mastoid palpation, no pops w/ tmj mvmt    -Ear: bilaterally no lesions or masses, normal hearing, translucent tms, nl light reflex,  Cardiovascular: nl s1/s2, no murmur Respiratory: CTAB, no increased work of breathing, no tongue deviation, no asymmetry in smille Abdomen: soft,nondistended, improved pitting edema Extremities: improved right arm edema,  Bilateral lower extremities wrapped in [place], toes exposed,  Neuro: persistent right sided numbness in right arm, normal grip strength, 5/5 strength in upper extremities bilaterally  Laboratory:  Recent Labs Lab 12/10/16 0236 12/11/16 0231 12/12/16 0309  WBC 9.8 9.8 7.0  HGB 11.5* 11.2* 11.3*  HCT 37.8 37.3 38.6  PLT 124* 142* 160    Recent Labs Lab 12/11/16 0231 12/11/16 1248 12/12/16 0309  NA 136 135 140  K 3.4* 4.2 3.1*  CL 91* 89* 90*  CO2 34* 35* 40*  BUN 21* 20 15  CREATININE 0.94 0.96 0.95  CALCIUM 8.4* 8.2* 8.6*  PROT  --  5.4*  --   BILITOT  --  1.3*  --   ALKPHOS  --  77  --   ALT  --  22  --   AST  --  71*  --   GLUCOSE 259* 194* 171*   Lactic Acid, Venous    Component Value Date/Time   LATICACIDVEN 2.3 (HH) 12/09/2016 0407   Dg Chest 2 View  Result Date: 12/07/2016 CLINICAL DATA:  61 year old female with progressive hyperglycemia. Shortness of  breath, hypoxia. EXAM: CHEST  2 VIEW COMPARISON:  10/27/2016 and earlier. FINDINGS: Semi upright AP and lateral views of the chest. Stable lung volumes. Stable enlarged cardiac and mediastinal contours, seen by chest CTA on 07/20/2016 to be primarily due to mediastinal lipomatosis. Decreased pulmonary vascularity compared to 10/27/2016, with no acute edema suspected today. No pneumothorax, pleural effusion or consolidation. Prior cervical ACDF. IMPRESSION: 1. No acute cardiopulmonary abnormality. Regressed pulmonary vascularity compared to 10/27/2016. 2. Mediastinal lipomatosis. Electronically Signed   By:  Odessa Fleming M.D.   On: 12/07/2016 14:09   Ct Angio Chest Pe W And/or Wo Contrast  Result Date: 12/07/2016 CLINICAL DATA:  Shortness of breath.  Suspected pulmonary embolism. EXAM: CT ANGIOGRAPHY CHEST WITH CONTRAST TECHNIQUE: Multidetector CT imaging of the chest was performed using the standard protocol during bolus administration of intravenous contrast. Multiplanar CT image reconstructions and MIPs were obtained to evaluate the vascular anatomy. CONTRAST:  100 cc Isovue 370 intravenous COMPARISON:  07/20/2016 FINDINGS: Cardiovascular: Pulmonary artery opacification is suboptimal due to bolus dispersion and patient size. There is reasonable visualization to the segmental level with no evidence of pulmonary embolism. Dilated main pulmonary artery at 43 mm, compatible with pulmonary hypertension. No acute aortic finding. Mild calcification at the aortic valve level. No notable atherosclerotic calcification. Mild thinning cardiomegaly. Mediastinum/Nodes: Mediastinal lipomatosis. Negative for adenopathy or mass. Lungs/Pleura: Low volume chest. There is asymmetric ground-glass opacity in the left lower lobe and right perihilar lung. Atelectatic type opacities that are subsegmental. Mosaic attenuation lungs which could be from small airway or small vessel disease. Upper Abdomen: No acute finding. Musculoskeletal: No acute or aggressive finding. Prominent spondylosis. Review of the MIP images confirms the above findings. IMPRESSION: 1. No evidence of pulmonary embolism. Sensitivity is diminished by patient body habitus. 2. Asymmetric airspace disease in the left lower lobe primarily suspicious for pneumonia. Right perihilar ground-glass opacity could also be infectious. 3. Enlarged pulmonary arteries as seen with pulmonary hypertension. 4. Mild atelectasis. Electronically Signed   By: Marnee Spring M.D.   On: 12/07/2016 15:30   Dg Chest Port 1 View  Result Date: 12/08/2016 CLINICAL DATA:  somnolence EXAM: PORTABLE  CHEST 1 VIEW COMPARISON:  12/08/2016 FINDINGS: The heart is enlarged. There is airspace filling opacity in the left lower lobe, associated with air bronchograms. Similar findings are identified at the medial right lung base. Suspect left pleural effusion. IMPRESSION: 1. Cardiomegaly. 2. Bilateral airspace filling opacities, left greater than right suspicious for infectious process. Electronically Signed   By: Norva Pavlov M.D.   On: 12/08/2016 20:18   Dg Chest Port 1 View  Result Date: 12/08/2016 CLINICAL DATA:  61 year old female with shortness of breath. EXAM: PORTABLE CHEST 1 VIEW COMPARISON:  Chest CT dated 12/07/2016 FINDINGS: There is bilateral central vascular prominence likely mild vascular congestion. Asymmetric airspace density primarily a left lung base with silhouetting of the left hemidiaphragm concerning for pneumonia. There is probable small left pleural effusion as seen on the prior CT. No pneumothorax. The cardiac borders are silhouetted. Lower cervical spine ACDF. No acute osseous pathology. IMPRESSION: 1. Left lung base opacity with silhouetting of the left hemidiaphragm concerning for pneumonia. Clinical correlation and follow-up recommended. 2. Probable mild vascular congestion. Electronically Signed   By: Elgie Collard M.D.   On: 12/08/2016 04:00    Garnette Gunner, MD 12/12/2016, 6:37 AM PGY-2,  Family Medicine FPTS Intern pager: [phone], text pages welcome

## 2016-12-12 NOTE — Progress Notes (Signed)
PCP note Feels significantly better this morning.  Still not eating much.  Very weak. She told me what she hopes will happen: She hopes that she can recover to the point where she can go home for a short time.  Her priority is to arrange her affairs - see that her pets are cared for and that heirlooms and possessions are distributed.  The she plans to enter the nursing home.  (It was unsaid that she plans to enter the nursing home to die - but the meaning seemed clear to me.)  I said that I understood her priorities and we would work to make it happen.  I added that we may not be able to arrange the home care she needs at DC.  She is aware that where she goes at discharge is ultimately her decision.

## 2016-12-13 DIAGNOSIS — T83510S Infection and inflammatory reaction due to cystostomy catheter, sequela: Secondary | ICD-10-CM

## 2016-12-13 LAB — BASIC METABOLIC PANEL
ANION GAP: 10 (ref 5–15)
BUN: 13 mg/dL (ref 6–20)
CO2: 41 mmol/L — ABNORMAL HIGH (ref 22–32)
Calcium: 8.5 mg/dL — ABNORMAL LOW (ref 8.9–10.3)
Chloride: 93 mmol/L — ABNORMAL LOW (ref 101–111)
Creatinine, Ser: 0.92 mg/dL (ref 0.44–1.00)
GFR calc Af Amer: >60 mL/min (ref >60)
Glucose, Bld: 155 mg/dL — ABNORMAL HIGH (ref 65–99)
POTASSIUM: 3 mmol/L — AB (ref 3.5–5.1)
SODIUM: 144 mmol/L (ref 135–145)

## 2016-12-13 LAB — CBC
HEMATOCRIT: 37.2 % (ref 36.0–46.0)
Hemoglobin: 10.7 g/dL — ABNORMAL LOW (ref 12.0–15.0)
MCH: 25.6 pg — ABNORMAL LOW (ref 26.0–34.0)
MCHC: 28.8 g/dL — ABNORMAL LOW (ref 30.0–36.0)
MCV: 89 fL (ref 78.0–100.0)
Platelets: 173 10*3/uL (ref 150–400)
RBC: 4.18 MIL/uL (ref 3.87–5.11)
RDW: 18.6 % — AB (ref 11.5–15.5)
WBC: 6.7 10*3/uL (ref 4.0–10.5)

## 2016-12-13 LAB — GLUCOSE, CAPILLARY
GLUCOSE-CAPILLARY: 95 mg/dL (ref 65–99)
GLUCOSE-CAPILLARY: 178 mg/dL — AB (ref 65–99)
GLUCOSE-CAPILLARY: 236 mg/dL — AB (ref 65–99)
GLUCOSE-CAPILLARY: 242 mg/dL — AB (ref 65–99)

## 2016-12-13 MED ORDER — POTASSIUM CHLORIDE CRYS ER 20 MEQ PO TBCR
40.0000 meq | EXTENDED_RELEASE_TABLET | Freq: Once | ORAL | Status: AC
  Administered 2016-12-13: 40 meq via ORAL
  Filled 2016-12-13: qty 2

## 2016-12-13 MED ORDER — INSULIN GLARGINE 100 UNIT/ML ~~LOC~~ SOLN
20.0000 [IU] | Freq: Every day | SUBCUTANEOUS | Status: DC
  Administered 2016-12-13 – 2016-12-14 (×2): 20 [IU] via SUBCUTANEOUS
  Filled 2016-12-13 (×3): qty 0.2

## 2016-12-13 MED ORDER — POTASSIUM CHLORIDE CRYS ER 20 MEQ PO TBCR
40.0000 meq | EXTENDED_RELEASE_TABLET | Freq: Two times a day (BID) | ORAL | Status: DC
  Administered 2016-12-13 – 2016-12-14 (×3): 40 meq via ORAL
  Filled 2016-12-13 (×3): qty 2

## 2016-12-13 NOTE — Progress Notes (Signed)
Admission note:  Arrival Method:  Patient arrived in bariatric bed from 2C accompanied by the staff.   Mental Orientation:  Alert and oriented x 4. Telemetry: 6E-13, NSR, CCMD notified. Assessment: See doc flow sheets. Skin: MASD in abdominal folds, groin, stage 2 on the left buttock, unna boots on the both bilateral legs, bruise on the left arm. IV: Left forearm saline lock. Pain: denies any pain. Tubes: chronic Foley use Safety Measures: Bed in low position, call bell and phone within reach. 6700 Orientation: Patient has been oriented to the unit, staff and to the room.

## 2016-12-13 NOTE — Progress Notes (Signed)
PCP visit: Note: patient is aware that I will be away this weekend and that she should communicate any change in the dispo plan to team.  The story gets richer.  She feels better today and agree with transfer to floor.  Despite my initial doubts, she will likely survive to DC.  Hence, continued, detailed DC planning is vital.  The plan is as per my note of yesterday: Home for a short period of time to get her affairs in order and then SNF.    Additional details. She is aware that we may not find a home health agency that will take her.  When asked how this would affect her plans, she replied, "I will hire a nurse."  I did not realize that once she is at SNF, patient plans comfort care only: "I will let nature take its course."  That said, I gave her one other option: Home with Hospice and comfort care only.  No rescue medicine (such as we are now doing in the hospital.)  She would be going home to die.  I suspect she could marshall the home support for a few weeks that I anticipate as her prognosis with a comfort care approach.  She will consider this option.  I anticipate that she will remain in house over the WE.  I will visit again on Monday, at which point the DC plans should be more firmly developed.

## 2016-12-13 NOTE — Progress Notes (Signed)
Family Medicine Teaching Service Daily Progress Note Intern Pager: [phone]  Patient name: Barbara [name] Medical record number: [id number] Date of birth: 1955/05/07 Age: 61 y.o. Gender: female  Primary Care Provider: Moses Manners, MD Consultants: none Code Status: DNR/DNI  Pt Overview and Major Events to Date:  8/4 admitted with UTI and CAP  Assessment and Plan: Barbara [name] is a 61 y.o. 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 PMH is significant for multiple sclerosis with spastic paraplegia, chronic prednisone use, indwelling chronic Foley, left lower extremity cellulitis, HTN, T2DM.   Visual disturbance: Today, pt is complaining of it worsening. Slightly worsening HA. Pt complaining a red "cube" that appears in her left lower. She states that she has been experiencing over the past week, but is worse today. The red cube will flash multiple times in a row then disappears. It last very briefly, less than a second. It is not associated with any pain. Pt endorses left sided headache. It is not worse when the flashes occur. She denies blurry vision in her left eye. Endorses Chronic blurry vision in her right eye. Bedside eye exam showed decreased vision in the left lower field. DDx includes retinal detachment, MS flair of occular nerve, left sided migraine with aura. Recent head MRI neg for acute intracranial change. Migraine seems less likely given the focal/repetative nature of the desturbance. Visual Acuity: 20/100 bilaterally, 20/100 R eye, 20/100 left eye Optho: suspects migraine w/ aura, no evidence of retinal detachment, recommend outpt f/u -assess visual status  Acute Respiratory Distress: Improved.Satting 95% on 4L O2. Likely multifactorial, Pneumonia, fluid overload, OHS.  suspect obesity hypoventilation syndrome as a portion of the etiology of her hypoxia, this is been off and on over the past year. Given that she is  immobile, and unlikely to trip over it, home oxygen may be a reasonable consideration for her to avoid future admissions. On 08/07, CXR - Bilateral airspace filling opacities, left greater than right suspicious for infectious process. -cont diuresis -cont-zosyn/linezolid -Consider home oxygen -Wean nasal cannula as tolerated  Sepsis: Improved. Last VSS. Pt is alert. Neuro status improving.  Source PNA likely HCAP, given insignificant growth on urine cultures. Head CT/MRI negative for acute changes.   -reduce sedating meds as much as possible vs. Pain/muscle spasm control. -follow neuro exam -CXR - Bilateral airspace filling opacities, left greater than right suspicious for infectious process. -Abx: IV Zosyn/linezolid started o/n 08/04,  -urine culture < 10K, insig growth -Blood cultures, Gram Variable Rods, cont. Broad spectrum -MRI showed bilateral mastoid effusion, HEENT was normal  Edema. Pt has chronic edema in lower extremities. Pt weight increased by 1lb. Concern for excessive fluid 3rd spacing. Albumin wnl -cont. diuresis -lasix to IV 20mg  q8h - daily weights  - monitor fluid status - strict I/Os    UTI: Insignificant growth on culture. Will need resumption of antibiotic prophylaxis for UTI when this acute course is completed. -Monitor I/Os -consider vaginal estrogen if persistent pain around foley site -restart UTI prophylaxis on d/c  Multiple Sclerosis with spastic paraplegia on chronic steroids: Currently taking 20mg  prednisone at home. Plan for very slow taper of prednisone made at recent home visit, refer to Dr. Deland Pretty note on 7/26, to decrease to 15 mg on 8/13.  - pt still experiencing pain,  - tramadol, diazapam prn - hold stress dose steroids  - restart home oral pred  HTN: Currently normotensive. On Lasix 40 mg twice a day at home.  -  on lasix 20 IV tid,  - hold if hypotensive  Hx of adrenal insufficiency: See plan for multiple sclerosis above.  -stress  dose hydrocortisone, started 8/07,  -pt VS improved, consider d/c stress dose and restart home oral pred 20mg   Hypokalemia:  -replete per protocol  T2DM:Insulin regimen prior to arrival unclear, CBGs poorly controlled when home visit was conducted on 7/26. Unclear if this poor control is due to infection versus confusion about insulin regimen. Hypoglycemia at present, but no signs of acidosis. Will continue to chase blood sugars with short acting NovoLog.  -poor po -improvine, but still in 200s -increase lantus to 40 -5 units novolog with meals tid - SSI-resistant - CBG qACHS - Clarify insulin regimen with PCP prior to discharge  Goals of care: Patient has a long-standing relationship with PCP, [name]. Long discussion of goals of care at last admission Included completing and most form. At this time, patient is DO NOT RESUSCITATE/DO NOT INTUBATE, does not desire ICU admission, does not desire dialysis, does not desire feeding tube. She is amenable to short-term hospitalizations for IV fluids and antibiotics. -She notes that she is comfortable going home with oxygen if necessary -Continue to reevaluate goals of care -pt is wishing to discuss distribution of her belongings, and how to arrange her care/funeral when she is discharged home.  -PT/OT recs: no f/u, Supervision 24hrs -HH for HHRN and Aide -appreciate palliative consult   FEN/GI: carb mod diet Prophylaxis: lovenox  Disposition: improved, transfer to med surg Subjective: Reports feeling fine this morning. She says that she the red cube is flashing more. HA slightly worse. Pain overall, is well controlled.   Objective: Temp:  [97.9 F (36.6 C)-98.2 F (36.8 C)] 97.9 F (36.6 C) (08/10 0404) Pulse Rate:  [71-82] 77 (08/10 0404) Resp:  [10-17] 17 (08/10 0404) BP: (108-127)/(58-91) 123/80 (08/10 0404) SpO2:  [94 %-100 %] 97 % (08/10 0404) Weight:  [360 lb 7.2 oz (163.5 kg)] 360 lb 7.2 oz (163.5 kg) (08/10  0404)  Physical Exam  Constitutional: She is oriented to person, place, and time. No distress.  HENT:  Head: Normocephalic.  Cardiovascular: Normal rate.  Exam reveals no gallop and no friction rub.   No murmur heard. Pulmonary/Chest: Effort normal and breath sounds normal.  In lower lobes bilaterally, improved  Abdominal: Soft. She exhibits no distension. There is no tenderness.  Neurological: She is alert and oriented to person, place, and time.     Laboratory:  Recent Labs Lab 12/11/16 0231 12/12/16 0309 12/13/16 0259  WBC 9.8 7.0 6.7  HGB 11.2* 11.3* 10.7*  HCT 37.3 38.6 37.2  PLT 142* 160 173    Recent Labs Lab 12/11/16 1248 12/12/16 0309 12/13/16 0259  NA 135 140 144  K 4.2 3.1* 3.0*  CL 89* 90* 93*  CO2 35* 40* 41*  BUN 20 15 13   CREATININE 0.96 0.95 0.92  CALCIUM 8.2* 8.6* 8.5*  PROT 5.4*  --   --   BILITOT 1.3*  --   --   ALKPHOS 77  --   --   ALT 22  --   --   AST 71*  --   --   GLUCOSE 194* 171* 155*   Lactic Acid, Venous    Component Value Date/Time   LATICACIDVEN 2.3 (HH) 12/09/2016 0407   Dg Chest 2 View  Result Date: 12/07/2016 CLINICAL DATA:  61 year old female with progressive hyperglycemia. Shortness of breath, hypoxia. EXAM: CHEST  2 VIEW COMPARISON:  10/27/2016 and  earlier. FINDINGS: Semi upright AP and lateral views of the chest. Stable lung volumes. Stable enlarged cardiac and mediastinal contours, seen by chest CTA on 07/20/2016 to be primarily due to mediastinal lipomatosis. Decreased pulmonary vascularity compared to 10/27/2016, with no acute edema suspected today. No pneumothorax, pleural effusion or consolidation. Prior cervical ACDF. IMPRESSION: 1. No acute cardiopulmonary abnormality. Regressed pulmonary vascularity compared to 10/27/2016. 2. Mediastinal lipomatosis. Electronically Signed   By: Odessa Fleming M.D.   On: 12/07/2016 14:09   Ct Angio Chest Pe W And/or Wo Contrast  Result Date: 12/07/2016 CLINICAL DATA:  Shortness of breath.   Suspected pulmonary embolism. EXAM: CT ANGIOGRAPHY CHEST WITH CONTRAST TECHNIQUE: Multidetector CT imaging of the chest was performed using the standard protocol during bolus administration of intravenous contrast. Multiplanar CT image reconstructions and MIPs were obtained to evaluate the vascular anatomy. CONTRAST:  100 cc Isovue 370 intravenous COMPARISON:  07/20/2016 FINDINGS: Cardiovascular: Pulmonary artery opacification is suboptimal due to bolus dispersion and patient size. There is reasonable visualization to the segmental level with no evidence of pulmonary embolism. Dilated main pulmonary artery at 43 mm, compatible with pulmonary hypertension. No acute aortic finding. Mild calcification at the aortic valve level. No notable atherosclerotic calcification. Mild thinning cardiomegaly. Mediastinum/Nodes: Mediastinal lipomatosis. Negative for adenopathy or mass. Lungs/Pleura: Low volume chest. There is asymmetric ground-glass opacity in the left lower lobe and right perihilar lung. Atelectatic type opacities that are subsegmental. Mosaic attenuation lungs which could be from small airway or small vessel disease. Upper Abdomen: No acute finding. Musculoskeletal: No acute or aggressive finding. Prominent spondylosis. Review of the MIP images confirms the above findings. IMPRESSION: 1. No evidence of pulmonary embolism. Sensitivity is diminished by patient body habitus. 2. Asymmetric airspace disease in the left lower lobe primarily suspicious for pneumonia. Right perihilar ground-glass opacity could also be infectious. 3. Enlarged pulmonary arteries as seen with pulmonary hypertension. 4. Mild atelectasis. Electronically Signed   By: Marnee Spring M.D.   On: 12/07/2016 15:30   Dg Chest Port 1 View  Result Date: 12/08/2016 CLINICAL DATA:  somnolence EXAM: PORTABLE CHEST 1 VIEW COMPARISON:  12/08/2016 FINDINGS: The heart is enlarged. There is airspace filling opacity in the left lower lobe, associated with  air bronchograms. Similar findings are identified at the medial right lung base. Suspect left pleural effusion. IMPRESSION: 1. Cardiomegaly. 2. Bilateral airspace filling opacities, left greater than right suspicious for infectious process. Electronically Signed   By: Norva Pavlov M.D.   On: 12/08/2016 20:18   Dg Chest Port 1 View  Result Date: 12/08/2016 CLINICAL DATA:  61 year old female with shortness of breath. EXAM: PORTABLE CHEST 1 VIEW COMPARISON:  Chest CT dated 12/07/2016 FINDINGS: There is bilateral central vascular prominence likely mild vascular congestion. Asymmetric airspace density primarily a left lung base with silhouetting of the left hemidiaphragm concerning for pneumonia. There is probable small left pleural effusion as seen on the prior CT. No pneumothorax. The cardiac borders are silhouetted. Lower cervical spine ACDF. No acute osseous pathology. IMPRESSION: 1. Left lung base opacity with silhouetting of the left hemidiaphragm concerning for pneumonia. Clinical correlation and follow-up recommended. 2. Probable mild vascular congestion. Electronically Signed   By: Elgie Collard M.D.   On: 12/08/2016 04:00    Garnette Gunner, MD 12/13/2016, 6:39 AM PGY-1, [facility] Family Medicine FPTS Intern pager: [phone], text pages welcome

## 2016-12-13 NOTE — Care Management Note (Addendum)
Case Management Note  Patient Details  Name: Barbara [name] MRN: [id number] Date of Birth: 30-Mar-1956  Subjective/Objective:      Pt admitted with bladder infection - pt with indwelling foley from home due to neurogenic bladder secondary to MS              Action/Plan:  PTA from home alone with family provided a lot of support.  Pt will likely need SNF - sister at bedside and pt is solement.  Pt is active with [facility] for [facility] and Aide.  CM requested PT/OT eval when medically stable.  CM will continue to follow for discharge needs   Expected Discharge Date:                  Expected Discharge Plan:  Home w Home Health Services  In-House Referral:  Clinical Social Work  Discharge planning Services  CM Consult  Post Acute Care Choice:    Choice offered to:  Patient (Deemed appropriate for HRI status - [facility] covering week of 8/9)  DME Arranged:    DME Agency:     HH Arranged:  RN, Nurse's Aide, Social Work [employer] Agency:  [facility]  Status of Service:  In process, will continue to follow  If discussed at Long Length of Stay Meetings, dates discussed:    Additional Comments: 12/13/2016  [facility] liason accepted referral - CM faxed requested documents to [phone].    CM received verbal referral for Home with Hospice - per [facility] pt has chosen [facility].  CM contacted agency and left voicemail informing of referral.    Pt informed CM that she has used home oxygen in the past but not currently on it.  Pt is still requiring [place] - CM informed attending that pt will need order for home oxygen and qualifying note if home oxygen is required.    Therapy recommends 24 hour supervision - pt does not have that in the home - therapy notified.  Pt states that her sister Okey Regal will continue to help out when needed.  Pt has been deemed inappropriate for PT and OT - as pt is at baseline with mobility - pt uses hoyer lift in the home, needs assistance with all  ADLs.  SW ordered to assist with placement from the community.  Pt will need PTAR transport home.  Discussed in LOS 8/9 - pt remains appropriate for continued stay.  Pt referred by Physician Advisor to [facility] for [facility] program - referral was accepted during LOS meeting.  CM discussed [facility] HRI with pt and she is in agreement with program and compliance required  12/11/16 [facility] declined pt.  CM awaiting call from [facility]  [facility] will not be able to resume services as insurance is not longer in network with agency.  CM contacted [place], [facility] and referral was declined.  CM left voicemail with [facility].  [facility] can accept pt however can not provide HHSW.    Pt is also active with [facility].  CM left voicemail for [facility] informing of admit Cherylann Parr, RN 12/13/2016, 9:18 AM

## 2016-12-13 NOTE — Progress Notes (Addendum)
I met with Barbara [name] today for palliative consultation.   61 yo with steroid dependent MS, morbid obesity, recurrent sepsis, indwelling foley, now bed bound.   1. We discussed her goals of care- disposition pressure and feeling a sense of loss of control has her very frustrated an irritable- as does being in the hospital attached to the noisy monitors.  2. She is resolute on a desire to be at home- so I tried to keep the focus on what was actually possible. Indeed there is much uncertainty about her family, friends and hired help contributions to her care- but with the right resources pieced together it may be possible. She says that [facility] said "no" that her prognosis was >6 months. Based on her stated goals and significant decline over the past 6 months related to MS, immune supression and recurrent sepsis, I believe hospice at home would be very appropriate- I will have [facility] from [place] come and evaluate her since [facility] told her she did not qualify, she is nervous to have them again. I will help facilitate this discussion of services with their liaison.  3. Move to a med surg floor. See if PT can get her OOB to a chair.  4. Her pain is well controlled on her home regimen, otherwise she is doing fairly well from that perspective.  5. DNR- unchanged  Her HCPOA is her brother-she requests no conversations be done without her being present or her consent. She is very mentally sharp, she knows the challenges ahead but may need more time to convince herself of what she needs to be safe and well cared for.   Lane Hacker, DO Palliative Medicine  Time: 50 minutes Greater than 50%  of this time was spent counseling and coordinating care related to the above assessment and plan.

## 2016-12-14 DIAGNOSIS — L8990 Pressure ulcer of unspecified site, unspecified stage: Secondary | ICD-10-CM | POA: Insufficient documentation

## 2016-12-14 LAB — BASIC METABOLIC PANEL
ANION GAP: 13 (ref 5–15)
BUN: 13 mg/dL (ref 6–20)
CALCIUM: 8.7 mg/dL — AB (ref 8.9–10.3)
CO2: 40 mmol/L — AB (ref 22–32)
Chloride: 93 mmol/L — ABNORMAL LOW (ref 101–111)
Creatinine, Ser: 1.1 mg/dL — ABNORMAL HIGH (ref 0.44–1.00)
GFR calc non Af Amer: 53 mL/min — ABNORMAL LOW (ref >60)
Glucose, Bld: 156 mg/dL — ABNORMAL HIGH (ref 65–99)
POTASSIUM: 3.6 mmol/L (ref 3.5–5.1)
Sodium: 146 mmol/L — ABNORMAL HIGH (ref 135–145)

## 2016-12-14 LAB — CBC
HCT: 39.3 % (ref 36.0–46.0)
HEMOGLOBIN: 11.1 g/dL — AB (ref 12.0–15.0)
MCH: 25.3 pg — AB (ref 26.0–34.0)
MCHC: 28.2 g/dL — ABNORMAL LOW (ref 30.0–36.0)
MCV: 89.5 fL (ref 78.0–100.0)
Platelets: 199 10*3/uL (ref 150–400)
RBC: 4.39 MIL/uL (ref 3.87–5.11)
RDW: 18.4 % — ABNORMAL HIGH (ref 11.5–15.5)
WBC: 8 10*3/uL (ref 4.0–10.5)

## 2016-12-14 LAB — CULTURE, BLOOD (ROUTINE X 2)
CULTURE: NO GROWTH
Special Requests: ADEQUATE
Special Requests: ADEQUATE

## 2016-12-14 LAB — GLUCOSE, CAPILLARY
GLUCOSE-CAPILLARY: 150 mg/dL — AB (ref 65–99)
GLUCOSE-CAPILLARY: 290 mg/dL — AB (ref 65–99)
Glucose-Capillary: 214 mg/dL — ABNORMAL HIGH (ref 65–99)
Glucose-Capillary: 204 mg/dL — ABNORMAL HIGH (ref 65–99)

## 2016-12-14 MED ORDER — LEVOFLOXACIN 500 MG PO TABS
500.0000 mg | ORAL_TABLET | Freq: Every day | ORAL | Status: DC
  Administered 2016-12-14 – 2016-12-16 (×3): 500 mg via ORAL
  Filled 2016-12-14 (×3): qty 1

## 2016-12-14 MED ORDER — FUROSEMIDE 20 MG PO TABS
20.0000 mg | ORAL_TABLET | Freq: Two times a day (BID) | ORAL | Status: DC
Start: 2016-12-14 — End: 2016-12-16
  Administered 2016-12-14 – 2016-12-16 (×5): 20 mg via ORAL
  Filled 2016-12-14 (×5): qty 1

## 2016-12-14 NOTE — Progress Notes (Signed)
Family Medicine Teaching Service Daily Progress Note Intern Pager: [phone]  Patient name: Barbara [name] Medical record number: [id number] Date of birth: 1955/09/13 Age: 61 y.o. Gender: female  Primary Care Provider: Moses Manners, MD Consultants: none Code Status: DNR/DNI  Pt Overview and Major Events to Date:  8/4 admitted with UTI and CAP  Assessment and Plan: Barbara [name] is a 61 y.o. 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 PMH is significant for multiple sclerosis with spastic paraplegia, chronic prednisone use, indwelling chronic Foley, left lower extremity cellulitis, HTN, T2DM.   Dyspnea, improving Etiology multifactorial including PNA, hypervolemia, OHS, obesity hypoventilation syndrome.  Breathing comfortably this AM at 98% on 4L, titrated down to 3Lwhile I was in the room. Difficult to assess volume status with obesity, and weights seem unreliable (340 lbs and 360 lbs charted in same day). Diuresed 4L over last 24 hours. CXR with PNA this admit. Afebrile, no leukocytosis. - s/p lasix 40 mg TID >> transition to home Lasix today 20 mg PO BID - s/p ceftriaxone on 8/4-8/5 - s/p zosyn (8/6 > 8/11) and linezolid (8/6 >8/11) >>>  transition to PO Abx today with day #1 levoquin (8/11 - ) - Wean nasal cannula as tolerated - continue daily weights, I/Os  ?Bacteremia vs contaminant - 8/6 blood culture with bacteroides fragilis in 1/2 vials.  Now s/p zosyn 8/6-8/11. Spoke with pharmacy,; may be contaminant given it was only in one vial, but either way, zosyn is a good medication for this bacteria and patient appears clinically improved, reasonable to DC zosyn. - de-escalate abx as noted above - continue to monitor clinical status  AMS, resolved: Initially with AMS meeting sepsis criteria but now AAOx3, negative head CT and MRI this admission. Continuing antibiotics as above, vitals stable as noted above.  - limit sedating meds  given recent AMS - follow neuro exam  Visual disturbance, improved: Patient had seen red cube, blurry vision. Was evaluated by ophthalmology and thought to be no evidence of acute process, no evidence of retinal detachment, suspect migraine with aura.  - outpatient follow up with optho, appreciate recs - monitor visual status daily  UTI on admission: Has been on Zosyn as noted above. Insignificant growth on repeat culture. Will need resumption of antibiotic prophylaxis for UTI when this acute course is completed. - Monitor I/Os - consider vaginal estrogen if persistent pain around foley site - restart UTI prophylaxis on d/c  Multiple Sclerosis with spastic paraplegia on chronic steroids: Currently taking 20 mg prednisone at home. Plan for very slow taper of prednisone made at recent home visit, refer to Dr. Deland Pretty note on 7/26, to decrease to 15 mg on 8/13.  - tramadol, diazapam prn - continue prednisone 20 mg today  HTN, stable: Currently normotensive. On PO Lasix 40 mg twice a day at home.  - diuresis as above 40 mg Lasix IV TID - s/p stress dose steroids 8/5-8/9, now back on home prednisone taper - monitor pressures   Hx of adrenal insufficiency, stable: See plan for multiple sclerosis above.  - stress dose hydrocortisone, started 8/06-8/09 - now on home pred taper as noted above  T2DM, stable:Outpatient regimen 10u lantus and SSI. Received 20u lantus last night. CBGs 242,156,150 overnight. Required 14 units aspart. - Lantus 20u >> 22u this evening  - monitor CBG closely now that she's no longer on stress dose steroids and infection resolving, may need to titrate down lantus in AM  Goals of care:  DNR/DNI. Working with palliative. Considering home hospice.  - appreciate palliative recs  FEN/GI: carb mod diet Prophylaxis: lovenox  Disposition: improved, transfer to med surg  Subjective:  Patient reports no dyspnea this AM. States her only complaint is burning at the  site of the foley insertion and she uses gel at home. Otherwise pleasant and conversant, in good spirits.   Objective: Temp:  [97.4 F (36.3 C)-98.5 F (36.9 C)] 97.9 F (36.6 C) (08/11 0813) Pulse Rate:  [81-86] 86 (08/11 0813) Resp:  [16-18] 18 (08/11 0813) BP: (104-146)/(64-121) 138/95 (08/11 0813) SpO2:  [92 %-99 %] 98 % (08/11 0813) Weight:  [340 lb (154.2 kg)] 340 lb (154.2 kg) (08/10 2045)  Physical Exam  GEN: obese, chronically ill female lies in bed in NAD, pleasant CARD: RRR, difficult to assess with body habitus PULM: distant lung sounds throughout ABD: obese, soft, nontender LE: wrapped, 1+ edema LE bil Psych: AAO x3, thought process linear  Laboratory:  Recent Labs Lab 12/12/16 0309 12/13/16 0259 12/14/16 0409  WBC 7.0 6.7 8.0  HGB 11.3* 10.7* 11.1*  HCT 38.6 37.2 39.3  PLT 160 173 199    Recent Labs Lab 12/11/16 1248 12/12/16 0309 12/13/16 0259 12/14/16 0409  NA 135 140 144 146*  K 4.2 3.1* 3.0* 3.6  CL 89* 90* 93* 93*  CO2 35* 40* 41* 40*  BUN 20 15 13 13   CREATININE 0.96 0.95 0.92 1.10*  CALCIUM 8.2* 8.6* 8.5* 8.7*  PROT 5.4*  --   --   --   BILITOT 1.3*  --   --   --   ALKPHOS 77  --   --   --   ALT 22  --   --   --   AST 71*  --   --   --   GLUCOSE 194* 171* 155* 156*   Lactic Acid, Venous    Component Value Date/Time   LATICACIDVEN 2.3 (HH) 12/09/2016 0407   Dg Chest 2 View8/08/2016 FINDINGS: Semi upright AP and lateral views of the chest. Stable lung volumes. Stable enlarged cardiac and mediastinal contours, seen by chest CTA on 07/20/2016 to be primarily due to mediastinal lipomatosis. Decreased pulmonary vascularity compared to 10/27/2016, with no acute edema suspected today. No pneumothorax, pleural effusion or consolidation. Prior cervical ACDF.  IMPRESSION:  1. No acute cardiopulmonary abnormality. Regressed pulmonary vascularity compared to 10/27/2016.  2. Mediastinal lipomatosis.   Ct Angio Chest Pe W And/or Wo Contrast  12/07/2016 IMPRESSION: 1. No evidence of pulmonary embolism. Sensitivity is diminished by patient body habitus.  2. Asymmetric airspace disease in the left lower lobe primarily suspicious for pneumonia. Right perihilar ground-glass opacity could also be infectious. 3. Enlarged pulmonary arteries as seen with pulmonary hypertension.  4. Mild atelectasis.    Dg Chest Port 1 View 12/08/2016 IMPRESSION:  1. Cardiomegaly.  2. Bilateral airspace filling opacities, left greater than right suspicious for infectious process.   Dg Chest Port 1 View 12/08/2016 IMPRESSION: 1. Left lung base opacity with silhouetting of the left hemidiaphragm concerning for pneumonia. Clinical correlation and follow-up recommended.  2. Probable mild vascular congestion.   Howard Pouch, MD 12/14/2016, 9:26 AM PGY-2, [place] FPTS Intern pager: [phone], text pages welcome

## 2016-12-15 LAB — BASIC METABOLIC PANEL
Anion gap: 9 (ref 5–15)
BUN: 15 mg/dL (ref 6–20)
CO2: 42 mmol/L — ABNORMAL HIGH (ref 22–32)
CREATININE: 1.17 mg/dL — AB (ref 0.44–1.00)
Calcium: 8.8 mg/dL — ABNORMAL LOW (ref 8.9–10.3)
Chloride: 92 mmol/L — ABNORMAL LOW (ref 101–111)
GFR calc Af Amer: 57 mL/min — ABNORMAL LOW (ref >60)
GFR, EST NON AFRICAN AMERICAN: 50 mL/min — AB (ref >60)
Glucose, Bld: 115 mg/dL — ABNORMAL HIGH (ref 65–99)
POTASSIUM: 3.7 mmol/L (ref 3.5–5.1)
SODIUM: 143 mmol/L (ref 135–145)

## 2016-12-15 LAB — CBC
HEMATOCRIT: 39.1 % (ref 36.0–46.0)
HEMOGLOBIN: 11.5 g/dL — AB (ref 12.0–15.0)
MCH: 26.3 pg (ref 26.0–34.0)
MCHC: 29.4 g/dL — ABNORMAL LOW (ref 30.0–36.0)
MCV: 89.3 fL (ref 78.0–100.0)
Platelets: 229 10*3/uL (ref 150–400)
RBC: 4.38 MIL/uL (ref 3.87–5.11)
RDW: 18.6 % — ABNORMAL HIGH (ref 11.5–15.5)
WBC: 10.3 10*3/uL (ref 4.0–10.5)

## 2016-12-15 LAB — GLUCOSE, CAPILLARY
GLUCOSE-CAPILLARY: 257 mg/dL — AB (ref 65–99)
Glucose-Capillary: 94 mg/dL (ref 65–99)
Glucose-Capillary: 119 mg/dL — ABNORMAL HIGH (ref 65–99)
Glucose-Capillary: 274 mg/dL — ABNORMAL HIGH (ref 65–99)

## 2016-12-15 MED ORDER — INSULIN GLARGINE 100 UNIT/ML ~~LOC~~ SOLN
15.0000 [IU] | Freq: Every day | SUBCUTANEOUS | Status: DC
  Administered 2016-12-15: 15 [IU] via SUBCUTANEOUS
  Filled 2016-12-15 (×2): qty 0.15

## 2016-12-15 NOTE — Progress Notes (Signed)
Family Medicine Teaching Service Daily Progress Note Intern Pager: [phone]  Patient name: Barbara [name] Medical record number: [id number] Date of birth: Sep 03, 1955 Age: 61 y.o. Gender: female  Primary Care Provider: Moses Manners, MD Consultants: none Code Status: DNR/DNI  Pt Overview and Major Events to Date:  8/4 admitted with UTI and CAP  Assessment and Plan: Barbara [name] is a 61 y.o. 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 PMH is significant for multiple sclerosis with spastic paraplegia, chronic prednisone use, indwelling chronic Foley, left lower extremity cellulitis, HTN, T2DM.   Dyspnea, improving Etiology multifactorial including PNA, hypervolemia, OHS, obesity hypoventilation syndrome.  Breathing comfortably this AM at 95% on 3.5L. Difficult to assess volume status with obesity, and weights seem unreliable. Diuresed 2.2 L over last 24 hours. CXR with PNA this admit. Afebrile, no leukocytosis. - s/p lasix 40 mg TID, transitioned to 20 mg lasix PO 20 mg BID  - s/p ceftriaxone on 8/4-8/5, s/p zosyn (8/6 > 8/11) and linezolid (8/6 >8/11)  - continue  transition to PO Abx today with day #2 levoquin (8/11 - ) - Wean nasal cannula as tolerated - continue daily weights, I/Os  ?Bacteremia vs contaminant - 8/6 blood culture with bacteroides fragilis in 1/2 vials.  Now s/p zosyn 8/6-8/11. Spoke with pharmacy,; may be contaminant given it was only in one vial, but either way, zosyn is a good medication for this bacteria and patient appears clinically improved, reasonable to DC zosyn. - abx as above - continue to monitor clinical status  T2DM, stable:Outpatient regimen 10u lantus and SSI. Received 20u lantus last night. CBGs 290, 204, 115, and 94 overnight. Required 20 units aspart. - Lantus titrate down 20u >>15u tonight - monitor CBG closely now that she's no longer on stress dose steroids and infection resolving, may need  to titrate down lantus in AM  AMS, resolved: Initially with AMS meeting sepsis criteria but now AAOx3, negative head CT and MRI this admission. Continuing antibiotics as above, vitals stable as noted above.  - limit sedating meds given recent AMS - follow neuro exam  Visual disturbance, improved: Patient had seen red cube, blurry vision. Was evaluated by ophthalmology and thought to be no evidence of acute process, no evidence of retinal detachment, suspect migraine with aura.  - outpatient follow up with optho, appreciate recs - monitor visual status daily  UTI on admission: Has been on Zosyn as noted above. Insignificant growth on repeat culture. Will need resumption of antibiotic prophylaxis for UTI when this acute course is completed. - Monitor I/Os - consider vaginal estrogen if persistent pain around foley site - restart UTI prophylaxis on d/c  Multiple Sclerosis with spastic paraplegia on chronic steroids: Currently taking 20 mg prednisone at home. Plan for very slow taper of prednisone made at recent home visit, refer to Dr. Deland Pretty note on 7/26, to decrease to 15 mg on 8/13.  - tramadol, diazapam prn - continue prednisone 20 mg today until 8/13  HTN, stable: Currently normotensive. On PO Lasix 40 mg twice a day at home.  - diuresis as above - s/p stress dose steroids 8/5-8/9, now back on home prednisone taper - monitor pressures   Hx of adrenal insufficiency, stable: See plan for multiple sclerosis above.  - stress dose hydrocortisone, started 8/06-8/09 - now on home pred taper as noted above  Goals of care: DNR/DNI. Working with palliative. Considering home hospice.  - appreciate palliative recs  FEN/GI: carb mod diet Prophylaxis:  lovenox  Disposition: improved, transfer to med surg  Subjective:  Patient is not dyspneic today. Doing well on 2L by [place] when I am in the room. Does note about 5 episodes of diarrhea overnight, which she states improved when she stopped  taking her protein supplement. Not feeling fevers or chills.  Objective: Temp:  [98.5 F (36.9 C)-98.8 F (37.1 C)] 98.6 F (37 C) (08/12 0416) Pulse Rate:  [72-85] 77 (08/12 0416) Resp:  [18-20] 18 (08/12 0416) BP: (126-145)/(67-90) 126/74 (08/12 0416) SpO2:  [94 %-95 %] 95 % (08/12 0416) Weight:  [341 lb (154.7 kg)-341 lb 0.8 oz (154.7 kg)] 341 lb 0.8 oz (154.7 kg) (08/12 0245)  Physical Exam  GEN: chronically ill, NAD CARD: RRR PULM: distant lung sounds throughout ABD: obese, soft, nontender LE: wrapped Psych: AAO x3  Laboratory:  Recent Labs Lab 12/13/16 0259 12/14/16 0409 12/15/16 0418  WBC 6.7 8.0 10.3  HGB 10.7* 11.1* 11.5*  HCT 37.2 39.3 39.1  PLT 173 199 229    Recent Labs Lab 12/11/16 1248  12/13/16 0259 12/14/16 0409 12/15/16 0418  NA 135  < > 144 146* 143  K 4.2  < > 3.0* 3.6 3.7  CL 89*  < > 93* 93* 92*  CO2 35*  < > 41* 40* 42*  BUN 20  < > 13 13 15   CREATININE 0.96  < > 0.92 1.10* 1.17*  CALCIUM 8.2*  < > 8.5* 8.7* 8.8*  PROT 5.4*  --   --   --   --   BILITOT 1.3*  --   --   --   --   ALKPHOS 77  --   --   --   --   ALT 22  --   --   --   --   AST 71*  --   --   --   --   GLUCOSE 194*  < > 155* 156* 115*  < > = values in this interval not displayed. Lactic Acid, Venous    Component Value Date/Time   LATICACIDVEN 2.3 (HH) 12/09/2016 0407   Dg Chest 2 View8/08/2016 FINDINGS: Semi upright AP and lateral views of the chest. Stable lung volumes. Stable enlarged cardiac and mediastinal contours, seen by chest CTA on 07/20/2016 to be primarily due to mediastinal lipomatosis. Decreased pulmonary vascularity compared to 10/27/2016, with no acute edema suspected today. No pneumothorax, pleural effusion or consolidation. Prior cervical ACDF.  IMPRESSION:  1. No acute cardiopulmonary abnormality. Regressed pulmonary vascularity compared to 10/27/2016.  2. Mediastinal lipomatosis.   Ct Angio Chest Pe W And/or Wo Contrast 12/07/2016 IMPRESSION: 1. No  evidence of pulmonary embolism. Sensitivity is diminished by patient body habitus.  2. Asymmetric airspace disease in the left lower lobe primarily suspicious for pneumonia. Right perihilar ground-glass opacity could also be infectious. 3. Enlarged pulmonary arteries as seen with pulmonary hypertension.  4. Mild atelectasis.    Dg Chest Port 1 View 12/08/2016 IMPRESSION:  1. Cardiomegaly.  2. Bilateral airspace filling opacities, left greater than right suspicious for infectious process.   Dg Chest Port 1 View 12/08/2016 IMPRESSION: 1. Left lung base opacity with silhouetting of the left hemidiaphragm concerning for pneumonia. Clinical correlation and follow-up recommended.  2. Probable mild vascular congestion.   Howard Pouch, MD 12/15/2016, 8:21 AM PGY-2,  Family Medicine FPTS Intern pager: [phone], text pages welcome

## 2016-12-16 ENCOUNTER — Ambulatory Visit: Payer: Self-pay | Admitting: Pharmacist

## 2016-12-16 ENCOUNTER — Telehealth: Payer: Self-pay | Admitting: Family Medicine

## 2016-12-16 DIAGNOSIS — I5032 Chronic diastolic (congestive) heart failure: Secondary | ICD-10-CM

## 2016-12-16 LAB — BASIC METABOLIC PANEL
ANION GAP: 7 (ref 5–15)
BUN: 14 mg/dL (ref 6–20)
CHLORIDE: 92 mmol/L — AB (ref 101–111)
CO2: 45 mmol/L — ABNORMAL HIGH (ref 22–32)
Calcium: 8.9 mg/dL (ref 8.9–10.3)
Creatinine, Ser: 1.32 mg/dL — ABNORMAL HIGH (ref 0.44–1.00)
GFR, EST AFRICAN AMERICAN: 50 mL/min — AB (ref >60)
GFR, EST NON AFRICAN AMERICAN: 43 mL/min — AB (ref >60)
Glucose, Bld: 134 mg/dL — ABNORMAL HIGH (ref 65–99)
POTASSIUM: 4.1 mmol/L (ref 3.5–5.1)
SODIUM: 144 mmol/L (ref 135–145)

## 2016-12-16 LAB — GLUCOSE, CAPILLARY
GLUCOSE-CAPILLARY: 107 mg/dL — AB (ref 65–99)
Glucose-Capillary: 195 mg/dL — ABNORMAL HIGH (ref 65–99)
Glucose-Capillary: 245 mg/dL — ABNORMAL HIGH (ref 65–99)

## 2016-12-16 LAB — CBC
HCT: 37.7 % (ref 36.0–46.0)
HEMOGLOBIN: 10.9 g/dL — AB (ref 12.0–15.0)
MCH: 25.7 pg — AB (ref 26.0–34.0)
MCHC: 28.9 g/dL — ABNORMAL LOW (ref 30.0–36.0)
MCV: 88.9 fL (ref 78.0–100.0)
PLATELETS: 249 10*3/uL (ref 150–400)
RBC: 4.24 MIL/uL (ref 3.87–5.11)
RDW: 18.9 % — ABNORMAL HIGH (ref 11.5–15.5)
WBC: 10.7 10*3/uL — AB (ref 4.0–10.5)

## 2016-12-16 MED ORDER — LEVOFLOXACIN 500 MG PO TABS: 500.0000 mg | ORAL_TABLET | Freq: Every day | ORAL | 0 refills | Status: DC

## 2016-12-16 MED ORDER — PREDNISONE 5 MG PO TABS: 15.0000 mg | ORAL_TABLET | Freq: Every day | ORAL | 0 refills | Status: DC

## 2016-12-16 MED ORDER — METHOCARBAMOL 500 MG PO TABS: 250.0000 mg | ORAL_TABLET | Freq: Three times a day (TID) | ORAL | 0 refills | Status: AC | PRN

## 2016-12-16 MED ORDER — PREDNISONE 5 MG PO TABS: 15.0000 mg | ORAL_TABLET | Freq: Every day | ORAL | Status: DC

## 2016-12-16 MED ORDER — GABAPENTIN 300 MG PO CAPS: 300.0000 mg | ORAL_CAPSULE | Freq: Three times a day (TID) | ORAL | 0 refills | Status: AC

## 2016-12-16 MED ORDER — FUROSEMIDE 20 MG PO TABS: 20.0000 mg | ORAL_TABLET | Freq: Two times a day (BID) | ORAL | 2 refills | Status: DC

## 2016-12-16 MED ORDER — TRAMADOL HCL 50 MG PO TABS: 50.0000 mg | ORAL_TABLET | Freq: Three times a day (TID) | ORAL | 0 refills | Status: AC | PRN

## 2016-12-16 MED ORDER — PNEUMOCOCCAL VAC POLYVALENT 25 MCG/0.5ML IJ INJ: 0.5000 mL | INJECTION | INTRAMUSCULAR | 0 refills | Status: AC

## 2016-12-16 MED FILL — FUROSEMIDE 20 MG TABLET: 20 | 15 days supply | Qty: 30 | Fill #0

## 2016-12-16 MED FILL — levoFLOXacin 500 MG TABS: 500 | 4 days supply | Qty: 4 | Fill #0

## 2016-12-16 MED FILL — predniSONE 5 MG TABS: 5 | 10 days supply | Qty: 30 | Fill #0

## 2016-12-16 MED FILL — METHOCARBAMOL 500 MG TABLET: 500 | 60 days supply | Qty: 90 | Fill #0

## 2016-12-16 NOTE — Progress Notes (Signed)
Pt D/C to home via stretcher with PTar. Alert & Oriented x4.

## 2016-12-16 NOTE — Discharge Instructions (Signed)
You came into the hospital for symptoms of high blood sugar, respiratory problems and UTI.  You have since been stabilized and are going home with hospice care and home oxygen.   We have lowered your prednisone to 15mg  based on your ongoing taper.  Hospice will be able to coordinate your care and help you maintain your goal of staying at home as much as possible.   You do have a followup appt with [name] at the clinic on Wed. @1 :30pm.   If this time does not work for you, please let us know and we can arrange something else.

## 2016-12-16 NOTE — Telephone Encounter (Signed)
Bambi, hospice coordinator, brought in referall forms for Dr Leveda Anna to sign.  Forms place in Drs box

## 2016-12-16 NOTE — Progress Notes (Signed)
SATURATION QUALIFICATIONS: (This note is used to comply with regulatory documentation for home oxygen)  Patient Saturations on Room Air at Rest = 86%  Patient Saturations on Room Air while Ambulating = Pt. Unable to ambulate  Patient Saturations on 2 Liters of oxygen while Ambulating = 93%  Please briefly explain why patient needs home oxygen: Patient unable to maintain adequate saturations on room air.

## 2016-12-16 NOTE — Progress Notes (Addendum)
I have discussed her condition with [facility] who will see her once she gets home and information has been sent to [name] regarding hospice admission. In the meantime, patient has expressed a consistent and strong desire to discharge home- we discussed goals to be getting her affairs in order, avoiding hospital readmission given her progressive MS and that she will need 24/7 assess to assistance and care sooner rather than later (we probably will not be able to avoid a nursing facility in her situation unless she has an acute decompensation at home and comfort care is initiated)- her family will for a short time provide care at home and she has been identified as an HRI patient and will receive in home [facility] services daily. In addition to MS, she has Grade 2 Diastolic Heart failure on an echo 07/17/2016, she requires aggressive diuresis and has peripheral edema. She is home oxygen dependent. She likely has a prognosis of <6 months, and will always be high risk for sudden death based on her co-morbid conditions.  Anderson Malta, DO Palliative Medicine [phone]

## 2016-12-16 NOTE — Care Management Note (Signed)
Case Management Note  Patient Details  Name: Barbara [name] MRN: [id number] Date of Birth: 1955/05/20  Subjective/Objective:       CM following for progression and d/c planning             Action/Plan: 12/16/2016 Pt for d/c to home  with hospice services.  Pt will transport by ambulance. [facility] to provide home oxygen. Tank brought to room and pt will be able to use the tank at home until her concentrator arrives at the home. Pt family will meet pt at home.    Expected Discharge Date:     12/16/2016             Expected Discharge Plan:  Home w Hospice Care  In-House Referral:  Clinical Social Work  Discharge planning Services  CM Consult  Post Acute Care Choice:  Hospice, Durable Medical Equipment Choice offered to:  Patient (Deemed appropriate for HRI status - [facility] covering week of 8/9)  DME Arranged:  Oxygen DME Agency:  [facility]  HH Arranged:  RN, Nurse's Aide, Social Work [employer] Agency:   [facility])  Status of Service:  Completed, signed off  If discussed at [employer], dates discussed:    Additional Comments:  Starlyn Skeans, RN 12/16/2016, 4:52 PM

## 2016-12-16 NOTE — Progress Notes (Signed)
Family Medicine Teaching Service Daily Progress Note Intern Pager: [phone]  Patient name: Barbara [name] Medical record number: [id number] Date of birth: 1955-12-19 Age: 61 y.o. Gender: female  Primary Care Provider: Moses Manners, MD Consultants: palliative  Code Status: DNR  Pt Overview and Major Events to Date:  Barbara Thompsonis a 61 y.o.femalepresenting with dysuria (indwelling foley 2/2 neurogenic bladder from MS) and hypoxia. PMH is significant for multiple sclerosis with spastic paraplegia, chronic prednisone use, indwelling chronic Foley, left lower extremity cellulitis, HTN, T2DM.   She has signed up with home hospice for discharge  Assessment and Plan: Barbara Thompsonis a 61 y.o.femalepresenting with dysuria (indwelling foley 2/2 neurogenic bladder from MS) and hypoxia. PMH is significant for multiple sclerosis with spastic paraplegia, chronic prednisone use, indwelling chronic Foley, left lower extremity cellulitis, HTN, T2DM.   Dyspnea, improving Etiology multifactorial including PNA, hypervolemia, OHS, obesity hypoventilation syndrome.  Breathing comfortably this AM at 95% on 3.5L. Difficult to assess volume status with obesity, and weights seem unreliable. Diuresed 2.2 L over last 24 hours. CXR with PNA this admit. Afebrile, no leukocytosis. - s/p lasix 40 mg TID, transitioned to 20 mg lasix PO 20 mg BID  - s/p ceftriaxone on 8/4-8/5, s/p zosyn (8/6 > 8/11) and linezolid (8/6 >8/11)  - continue  transition to PO Abx  with day #3 levoquin (8/11 - ) - Patient to discharge on 2L nasal canula, DME ordered - continue daily weights, I/Os  ?Bacteremia vs contaminant - 8/6 blood culture with bacteroides fragilis in 1/2 vials.  Now s/p zosyn 8/6-8/11. Spoke with pharmacy,; may be contaminant given it was only in one vial, but either way, zosyn is a good medication for this bacteria and patient appears clinically improved, reasonable to DC zosyn. - abx as above - continue  to monitor clinical status  T2DM, stable:Outpatient regimen 10u lantus and SSI. 15 lantus in hospital - monitor CBG closely now that she's no longer on stress dose steroids and infection resolving, may need to titrate down lantus in AM  AMS, resolved: Initially with AMS meeting sepsis criteria but now AAOx3, negative head CT and MRI this admission. Continuing antibiotics as above, vitals stable as noted above.  - limit sedating meds given recent AMS - follow neuro exam  Visual disturbance, improved: Patient had seen red cube, blurry vision. Was evaluated by ophthalmology and thought to be no evidence of acute process, no evidence of retinal detachment, suspect migraine with aura.  - outpatient follow up with optho, appreciate recs - monitor visual status daily  UTI on admission: Has been on Zosyn as noted above. Insignificant growth on repeat culture. Will need resumption of antibiotic prophylaxis for UTI when this acute course is completed. - Monitor I/Os - consider vaginal estrogen if persistent pain around foley site - restart UTI prophylaxis on d/c  Multiple Sclerosis with spastic paraplegia on chronic steroids: Currently taking 20 mg prednisone at home. Plan for very slow taper of prednisone made at recent home visit, refer to Dr. Deland Pretty note on 7/26, to decrease to 15 mg on 8/13.  - tramadol, diazapam prn - decrease prednisone to 15mg , per Dr. Deland Pretty taper  HTN, stable: Currently normotensive. On PO Lasix 40 mg twice a day at home.  - diuresis as above - s/p stress dose steroids 8/5-8/9, now back on home prednisone taper - monitor pressures   Hx of adrenal insufficiency, stable: See plan for multiple sclerosis above.  - stress dose hydrocortisone, started 8/06-8/09 - now on home pred  taper as noted above  Goals of care: DNR/DNI. Working with palliative. Considering home hospice.  - appreciate palliative recs  FEN/GI: carb mod diet Prophylaxis:  lovenox  Disposition: to home hospice as soon as home oxygen is delivered  Subjective:  Patient wants to go home with hospice.   Her priority currently is not being in a hospital and she is aware that can reduce her life expectancy.  She feels she is breathing better and does not like the nasal canula but thinks it helps out.  Objective: Temp:  [97.7 F (36.5 C)-98.5 F (36.9 C)] 97.7 F (36.5 C) (08/13 0537) Pulse Rate:  [68-93] 70 (08/13 0537) Resp:  [16-20] 18 (08/13 0537) BP: (135-150)/(72-88) 143/72 (08/13 0537) SpO2:  [90 %-97 %] 97 % (08/13 0537) Weight:  [341 lb (154.7 kg)-341 lb 0.8 oz (154.7 kg)] 341 lb 0.8 oz (154.7 kg) (08/13 0500) Physical Exam: GEN: obese, chronically ill female lies in bed in NAD, immobile, pleasant CARD: RRR, difficult to assess with body habitus PULM: distant lung sounds throughout, no wheezing but seems more decreased on lower lobes ABD: obese, soft, nontender LE: wrapped, 1+ edema LE bil Psych: AAO x3, thought process linear  Laboratory:  Recent Labs Lab 12/14/16 0409 12/15/16 0418 12/16/16 0511  WBC 8.0 10.3 10.7*  HGB 11.1* 11.5* 10.9*  HCT 39.3 39.1 37.7  PLT 199 229 249    Recent Labs Lab 12/11/16 1248  12/14/16 0409 12/15/16 0418 12/16/16 0511  NA 135  < > 146* 143 144  K 4.2  < > 3.6 3.7 4.1  CL 89*  < > 93* 92* 92*  CO2 35*  < > 40* 42* 45*  BUN 20  < > 13 15 14   CREATININE 0.96  < > 1.10* 1.17* 1.32*  CALCIUM 8.2*  < > 8.7* 8.8* 8.9  PROT 5.4*  --   --   --   --   BILITOT 1.3*  --   --   --   --   ALKPHOS 77  --   --   --   --   ALT 22  --   --   --   --   AST 71*  --   --   --   --   GLUCOSE 194*  < > 156* 115* 134*  < > = values in this interval not displayed.    Imaging/Diagnostic Tests: Dg Chest 2 View  Result Date: 12/07/2016 CLINICAL DATA:  61 year old female with progressive hyperglycemia. Shortness of breath, hypoxia. EXAM: CHEST  2 VIEW COMPARISON:  10/27/2016 and earlier. FINDINGS: Semi upright AP  and lateral views of the chest. Stable lung volumes. Stable enlarged cardiac and mediastinal contours, seen by chest CTA on 07/20/2016 to be primarily due to mediastinal lipomatosis. Decreased pulmonary vascularity compared to 10/27/2016, with no acute edema suspected today. No pneumothorax, pleural effusion or consolidation. Prior cervical ACDF. IMPRESSION: 1. No acute cardiopulmonary abnormality. Regressed pulmonary vascularity compared to 10/27/2016. 2. Mediastinal lipomatosis. Electronically Signed   By: Odessa Fleming M.D.   On: 12/07/2016 14:09   Ct Angio Chest Pe W And/or Wo Contrast  Result Date: 12/07/2016 CLINICAL DATA:  Shortness of breath.  Suspected pulmonary embolism. EXAM: CT ANGIOGRAPHY CHEST WITH CONTRAST TECHNIQUE: Multidetector CT imaging of the chest was performed using the standard protocol during bolus administration of intravenous contrast. Multiplanar CT image reconstructions and MIPs were obtained to evaluate the vascular anatomy. CONTRAST:  100 cc Isovue 370 intravenous COMPARISON:  07/20/2016 FINDINGS:  Cardiovascular: Pulmonary artery opacification is suboptimal due to bolus dispersion and patient size. There is reasonable visualization to the segmental level with no evidence of pulmonary embolism. Dilated main pulmonary artery at 43 mm, compatible with pulmonary hypertension. No acute aortic finding. Mild calcification at the aortic valve level. No notable atherosclerotic calcification. Mild thinning cardiomegaly. Mediastinum/Nodes: Mediastinal lipomatosis. Negative for adenopathy or mass. Lungs/Pleura: Low volume chest. There is asymmetric ground-glass opacity in the left lower lobe and right perihilar lung. Atelectatic type opacities that are subsegmental. Mosaic attenuation lungs which could be from small airway or small vessel disease. Upper Abdomen: No acute finding. Musculoskeletal: No acute or aggressive finding. Prominent spondylosis. Review of the MIP images confirms the above  findings. IMPRESSION: 1. No evidence of pulmonary embolism. Sensitivity is diminished by patient body habitus. 2. Asymmetric airspace disease in the left lower lobe primarily suspicious for pneumonia. Right perihilar ground-glass opacity could also be infectious. 3. Enlarged pulmonary arteries as seen with pulmonary hypertension. 4. Mild atelectasis. Electronically Signed   By: Marnee Spring M.D.   On: 12/07/2016 15:30   Dg Chest Port 1 View  Result Date: 12/08/2016 CLINICAL DATA:  somnolence EXAM: PORTABLE CHEST 1 VIEW COMPARISON:  12/08/2016 FINDINGS: The heart is enlarged. There is airspace filling opacity in the left lower lobe, associated with air bronchograms. Similar findings are identified at the medial right lung base. Suspect left pleural effusion. IMPRESSION: 1. Cardiomegaly. 2. Bilateral airspace filling opacities, left greater than right suspicious for infectious process. Electronically Signed   By: Norva Pavlov M.D.   On: 12/08/2016 20:18   Dg Chest Port 1 View  Result Date: 12/08/2016 CLINICAL DATA:  61 year old female with shortness of breath. EXAM: PORTABLE CHEST 1 VIEW COMPARISON:  Chest CT dated 12/07/2016 FINDINGS: There is bilateral central vascular prominence likely mild vascular congestion. Asymmetric airspace density primarily a left lung base with silhouetting of the left hemidiaphragm concerning for pneumonia. There is probable small left pleural effusion as seen on the prior CT. No pneumothorax. The cardiac borders are silhouetted. Lower cervical spine ACDF. No acute osseous pathology. IMPRESSION: 1. Left lung base opacity with silhouetting of the left hemidiaphragm concerning for pneumonia. Clinical correlation and follow-up recommended. 2. Probable mild vascular congestion. Electronically Signed   By: Elgie Collard M.D.   On: 12/08/2016 04:00     Marthenia Rolling, DO 12/16/2016, 7:04 AM PGY-1, [place] [facility] FPTS Intern pager: [phone], text pages welcome

## 2016-12-16 NOTE — Care Management Important Message (Signed)
Important Message  Patient Details  Name: Barbara [name] MRN: [id number] Date of Birth: 27-Apr-1956   Medicare Important Message Given:  Yes    Chaniyah Jahr [place] 12/16/2016, 11:20 AM

## 2016-12-16 NOTE — Progress Notes (Signed)
Physical Therapy Treatment Patient Details Name: Barbara [name] MRN: [id number] DOB: 13-Dec-1955 Today's Date: 12/16/2016    History of Present Illness Pt admitted with bladder catheter infection and hypoxia. PMH: indwelling foley due to neurogenic bladder, MS, HTN, DM, L LE cellulitis.    PT Comments    Continuing work on functional mobility and activity tolerance;  Worked on Room air, and O2 sats decr to 82% (with a good waveform); restarted supplemental O2 2 liters via [place] and O2 sats incr to greater tahn or equal to 93% rather quickly; ended session with [place] in on 1 L supplemental O2; Shawna is dependent on a hoyer lift for transfers OOB to chair at home; Recommend nursing use maximove lift to her her get out of bed daily  Follow Up Recommendations  No PT follow up;Supervision/Assistance - 24 hour     Equipment Recommendations  None recommended by PT    Recommendations for Other Services       Precautions / Restrictions Precautions Precautions: Fall    Mobility  Bed Mobility Overal bed mobility: Needs Assistance Bed Mobility: Rolling;Supine to Sit;Sit to Supine Rolling: Total assist;+2 for physical assistance;+2 for safety/equipment   Supine to sit: Total assist;+2 for physical assistance Sit to supine: Total assist;+2 for physical assistance   General bed mobility comments: pt assisted with UE's and trunk, though still needed significant 2 person assist  Transfers                    Ambulation/Gait                 Stairs            Wheelchair Mobility    Modified Rankin (Stroke Patients Only)       Balance     Sitting balance-Leahy Scale: Poor Sitting balance - Comments: pt unable to acept any challenge without falling backward.                                    Cognition Arousal/Alertness: Awake/alert Behavior During Therapy: WFL for tasks assessed/performed Overall Cognitive Status: Within Functional Limits for  tasks assessed                                        Exercises      General Comments General comments (skin integrity, edema, etc.): Worked on Room air, and O2 sats decr to 82% (with a good waveform); restarted supplemental O2 2 liters via [place] and O2 sats incr to greater tahn or equal to 93% rather quickly; ended session with [place] in on 1 L supplemental O2      Pertinent Vitals/Pain Pain Assessment: 0-10 Pain Score: 0-No pain    Home Living                      Prior Function            PT Goals (current goals can now be found in the care plan section) Acute Rehab PT Goals Patient Stated Goal: get home to be able to get my affairs/home in order PT Goal Formulation: With patient Time For Goal Achievement: 12/19/16 Potential to Achieve Goals: Good Progress towards PT goals: Progressing toward goals    Frequency    Min 3X/week      PT Plan Current  plan remains appropriate    Co-evaluation              AM-PAC PT "6 Clicks" Daily Activity  Outcome Measure  Difficulty turning over in bed (including adjusting bedclothes, sheets and blankets)?: Total Difficulty moving from lying on back to sitting on the side of the bed? : Total Difficulty sitting down on and standing up from a chair with arms (e.g., wheelchair, bedside commode, etc,.)?: Total Help needed moving to and from a bed to chair (including a wheelchair)?: Total Help needed walking in hospital room?: Total Help needed climbing 3-5 steps with a railing? : Total 6 Click Score: 6    End of Session Equipment Utilized During Treatment: Oxygen Activity Tolerance: Patient tolerated treatment well;Patient limited by fatigue Patient left: in bed;with call bell/phone within reach;with bed alarm set Nurse Communication: Mobility status;Need for lift equipment PT Visit Diagnosis: Other abnormalities of gait and mobility (R26.89);Muscle weakness (generalized) (M62.81);Other symptoms and signs  involving the nervous system (R29.898)     Time: 2694-8546 PT Time Calculation (min) (ACUTE ONLY): 26 min  Charges:  $Therapeutic Activity: 23-37 mins                    G Codes:       Van Clines, PT  Acute Rehabilitation Services Pager [phone] Office [phone]    Levi Aland 12/16/2016, 1:28 PM

## 2016-12-17 ENCOUNTER — Telehealth: Payer: Self-pay

## 2016-12-17 NOTE — Telephone Encounter (Signed)
Barbara [name] needs a verbal order for pt for admittance and evaluation for hospice. She has received a message from Dr. Phillips Odor from last hospitalization.Barbara [name]

## 2016-12-17 NOTE — Telephone Encounter (Signed)
Very frustrating.  First, I believe the patient is being served by another agency - please see phone note of yesterday.  Ms Barbara [name] did not leave her extension.  I spent 20+ minutes trying to navigate the phone system and was unable to reach Ms. [name].  Please get an extension if she calls back.

## 2016-12-17 NOTE — Telephone Encounter (Signed)
Forms completed and faxed yesterday.

## 2016-12-18 ENCOUNTER — Inpatient Hospital Stay: Payer: PPO | Admitting: Family Medicine

## 2016-12-18 ENCOUNTER — Other Ambulatory Visit: Payer: Self-pay | Admitting: *Deleted

## 2016-12-18 ENCOUNTER — Encounter: Payer: Self-pay | Admitting: *Deleted

## 2016-12-18 ENCOUNTER — Telehealth: Payer: Self-pay | Admitting: *Deleted

## 2016-12-18 ENCOUNTER — Other Ambulatory Visit: Payer: Self-pay | Admitting: Pharmacist

## 2016-12-18 NOTE — Patient Outreach (Signed)
[facility]) Care Management  12/18/2016  Barbara [name] 1955/05/09 [id number]   CSW noted plans for Palliative consult and care at home. CSW contacted patient who reports she was released from hospital on Monday to home.  She reports she is doing fine, no needs and is awaiting follow up from Palliative team.  Patient is aware of the duplication of care given her plans to pursue Palliative team involvement and understands plans for [facility] CSW to close case/referral.  CSW encouraged her to seek support from [facility] CSW if needs arise, as well as to pursue support form community resources links provided.  CSW will advise [facility] team and PCP of plans to close CSW case/referrall at this time.     Reece Levy, MSW, LCSW Clinical Social Worker  [facility] [employer] [phone]

## 2016-12-18 NOTE — Telephone Encounter (Signed)
Noted  

## 2016-12-18 NOTE — Patient Outreach (Addendum)
[facility]) Care Management  12/18/2016  Barbara [name] 04-06-1956 [id number]   Patient has been discharged home on 8/13  with Hospice services by [facility].  Plan Will close case per protocol , will notify MD, Patient by letter, and send inbasket message to [facility] team members , [name] and Reece Levy, of case closure.  Egbert Garibaldi, RN, [facility] Care Management,Care Management Coordinator  [phone]- Mobile [phone]- Toll Free Main Office

## 2016-12-18 NOTE — Telephone Encounter (Signed)
Bambi with Hospice Service called stating they evaluated patient for hospice care. At this time hospice service is not appropriate for patient. They will continue to follow are on a palliative base. They also will send out referral to [facility]. Please expect a call from [facility] for further orders. Please call with questions at [phone].  Clovis Pu, RN

## 2016-12-18 NOTE — Patient Outreach (Signed)
Receive message from [facility] Ander Purpura that patient has been discharged home on 8/13  with Hospice services by [facility].  Plan Will close pharmacy episode per protocol.  [name], PharmD, [facility] Clinical Pharmacist [facility] Care Management [phone]

## 2016-12-19 ENCOUNTER — Other Ambulatory Visit: Payer: Self-pay | Admitting: *Deleted

## 2016-12-19 NOTE — Patient Outreach (Signed)
[facility]) Care Management  12/19/2016  Barbara [name] 13-Jun-1955 [id number]  Admitted 8/4 with Dysuria and hypoxia., PNA Discharged 8/13 [facility] .  Patient case previously closed to care management , patient to be followed for Hospice services.   Noted patient eligible for Palliative care services, not hospice at this time after evaluation with patient at home by [facility] and Hospice. Patient will be followed with their Palliative care  Bridge program , which involves weekly calls to patient and send follow up to MD. Spoke with Scarlett Presto at agency she discussed patient has family support, some hired assistance in home but still a gap of time that patient is home alone and immobile.  Patient has been [facility] to [facility].  Placed call to [facility], spoke with Victorino Dike scheduler  , and patient to has agreed to  1st home visit on tomorrow.   Attempted call to patient , no answer able to leave a hipaa compliant message requesting a return call.   Since Hospice services are not providing care management services at this time , patient is eligible for continued [facility] care management complex care management program.   Plan Will plan follow call to patient on next business day. Will explain to patient she remains eligible for [facility] care management services, and offered continued services.  Will send PCP in basket message regarding patient currently still eligible for [facility] care management services since Hospice is not providing care management services at this time.    Egbert Garibaldi, RN, [facility] Care Management,Care Management Coordinator  [phone]- Mobile [phone]- Toll Free Main Office

## 2016-12-20 ENCOUNTER — Ambulatory Visit: Payer: Self-pay | Admitting: Pharmacist

## 2016-12-20 ENCOUNTER — Other Ambulatory Visit: Payer: Self-pay | Admitting: *Deleted

## 2016-12-20 ENCOUNTER — Telehealth: Payer: Self-pay | Admitting: *Deleted

## 2016-12-20 NOTE — Patient Outreach (Addendum)
[facility]) Care Management  12/20/2016  Barbara [name] 1955/09/04 [id number]  12/19/16 at 1802 Received return call from patient she left a voice mail message, stating" returning call everything is fine , we're all set up, [facility] is suppose to come tomorrow, everything is fine  you do not need to call back .    8/17 1448 Placed return call to patient,reports she is doing fine.Very pleasant over the phone ,Discussed her [facility], and patient being eligible for Bridge to palliative care only, Patient eligible for  continued follow up for complex care management from [facility],patient agreeable  Patient discussed she is  [facility].Patient has reports UNNA boots in place to legs.  Patient discussed she has hired assistance 4 to 5 nights a week, from 930 pm to 0830 am, she reports person is able to assist her using hoyer lift, and do personal care. Patient discussed her family available to assist at times. Patient does not have 24 hour support.  Patient reports her blood sugar today was in the 200 range , but higher on yesterday she attributes it to eating persimmon pudding .  Patient reports she checks her blood sugar at least once daily, and using novolog sliding scale for blood sugar over 200 and Lantus at night. Patient discussed she is still having difficulty with vision, being blurry as in hospital, states she has to use a magnifying glass to eye or close right eye to be able and see from left eye.  She discussed she plans to follow up on this concern.   Patient able to review medication list, she discussed she has 3 more days of antibiotic. Patient discussed she is just about to fill her new pill organizer. Outpatient Encounter Prescriptions as of 12/20/2016  Medication Sig  . Apple Cider Vinegar 188 MG CAPS Take 188 mg by mouth 2 (two) times daily.   Marland Kitchen aspirin EC 325 MG tablet Take 325 mg by mouth  every evening.   . Cholecalciferol (VITAMIN D3) 1000 units CAPS Take 4,000 Units by mouth every evening.   . Coenzyme Q10 (COQ10) 100 MG CAPS Take 100 mg by mouth every evening.  . diazepam (VALIUM) 5 MG tablet TAKE 1 TABLET BY MOUTH EVERY 6 HOURS AS NEEDED FOR LEG SPASMS (Patient taking differently: Take 5 mg by mouth every 6 (six) hours as needed for muscle spasms. )  . furosemide (LASIX) 20 MG tablet Take 1 tablet (20 mg total) by mouth 2 (two) times daily.  Marland Kitchen gabapentin (NEURONTIN) 300 MG capsule Take 1 capsule (300 mg total) by mouth 3 (three) times daily.  . insulin aspart (NOVOLOG) 100 UNIT/ML FlexPen 10 units [place] for every 100 mg/dL above fasting 409 mg/dl once in morning  . Insulin Glargine (LANTUS) 100 UNIT/ML Solostar Pen Inject 10 Units into the skin daily at 10 pm. (Patient taking differently: Inject 13 Units into the skin at bedtime. )  . levofloxacin (LEVAQUIN) 500 MG tablet Take 1 tablet (500 mg total) by mouth daily.  . Magnesium 250 MG TABS Take 250 mg by mouth every evening.   . methocarbamol (ROBAXIN) 500 MG tablet Take 0.5 tablets (250 mg total) by mouth 3 (three) times daily as needed for muscle spasms.  . Multiple Vitamin (MULTIVITAMIN WITH MINERALS) TABS tablet Take 1 tablet by mouth every evening.  . nitrofurantoin (MACRODANTIN) 50 MG capsule Take 1 capsule (50 mg total) by mouth 2 (two) times  daily.  . predniSONE (DELTASONE) 5 MG tablet Take 3 tablets (15 mg total) by mouth daily with breakfast.  . traMADol (ULTRAM) 50 MG tablet Take 1 tablet (50 mg total) by mouth every 8 (eight) hours as needed.  Marland Kitchen ibuprofen (ADVIL,MOTRIN) 400 MG tablet Take 1 tablet (400 mg total) by mouth every 6 (six) hours as needed for headache or moderate pain. (Patient not taking: Reported on 12/07/2016)   No facility-administered encounter medications on file as of 12/20/2016.    Patient was recently discharged from hospital and all medications have been reviewed. When asked about foley patient  reports it is doing fine, draining okay, "so far so good" reports keeping foley bag hanging low to help with drainage.  Discussed PCP follow up , she reports she got a call, from office and they cancelled her appointment for 8/15. Patient states she will follow up with MD by Mychart.   Telephone call ended due to patient states home health is arriving at her home, she requesting to follow up next week.  Plan Will follow patient for complex care management due to concerns with no 24 hours supervision, high risk for readmission,will follow up with patient in the next week by phone. Discuss PCP follow up plans   Will place care coordination call to home health regarding foley care plan.  Encouraged patient to call for new worsening symptoms of pain, fever, shortness of breath, foley leaking or change in color of urine.  Will send telephone visit note to PCP.  Egbert Garibaldi, RN, [facility] Care Management,Care Management Coordinator  [phone]- Mobile [phone]- Toll Free Main Office

## 2016-12-20 NOTE — Telephone Encounter (Signed)
Pam from [facility] called.  She is at the patients house now.  She reports that the patient is not a candidate for home care and feels that she needs 24 hour supervision.  You can contact Pam @ [phone]. Slater Mcmanaman, Maryjo Rochester, CMA

## 2016-12-20 NOTE — Telephone Encounter (Signed)
[name] at [facility] [phone]) left message that RN will be going to patient's home today. Requesting return call once [name] notified. [name] made aware and [facility] notified. Kinnie Feil, RN, BSN

## 2016-12-20 NOTE — Telephone Encounter (Signed)
Noted and agree. 

## 2016-12-23 ENCOUNTER — Encounter: Payer: Self-pay | Admitting: *Deleted

## 2016-12-23 ENCOUNTER — Telehealth: Payer: Self-pay | Admitting: *Deleted

## 2016-12-23 ENCOUNTER — Other Ambulatory Visit: Payer: Self-pay | Admitting: *Deleted

## 2016-12-23 DIAGNOSIS — G35 Multiple sclerosis: Secondary | ICD-10-CM

## 2016-12-23 NOTE — Patient Outreach (Signed)
[facility]) Care Management  12/23/2016  Barbara [name] 08-13-1955 [id number]   Spoke with patient , reports being upset after visit [facility] , that states they will not be able to provide home health services, due to patient needing 24 hours supervision . Patient states everyone knew she did not have 24 hours care at home. .  Patient states she still wants to be able to stay at home, but understands that 24 hour care at facility may eventually be the only option .Discussed paying for 24 hour care at home, Patient states she cannot afford to pay for care at home. She currently is paying for assistance for someone staying 4 to 5 nights a week for 8 to 10 hours that is able to provide personal care,  assist with care using hoyer lift , and has intermittent assistance from family and neighbors during the day to assist with meal prep, household chores.  Discussed importance of safety at home, medical alert system to increase safety, patient again states she cannot afford, but she keeps her phone near her at all times.   Patient currently with UNNA boots in place, no wound care since discharge, Foley in place patient reports urine color clear , no leakage.  Patient reports this am blood sugar 345, taking insulin Plan call to Dr.Hensel regarding Plan of care.   1030 Incoming call from Bambi from [facility] and hospice, stating she received a call from patient regarding not being eligible for home health services, again mentioning that she was aware patient did not have 24 hour care before referring to home health.  Bambi discussed safety liability due to patient not having 24 care , and patient discussing at home visit she still wanted to seek aggressive care for sepsis and this was not in line with hospice care.    1515 Incoming call from Dr.Hensel, that has spoken with patient on today, and  discussed patient current status and need for care  related to [facility] boot and foley care, and patient desire to remain at home with home health services if possible , discussed [facility] had orders for home health during admission prior decision for home with hospice and possibly they may consider evaluating patient for home health.  1600  Returned call to patient to update regarding conversation with MD regarding seeking home health services, to assist with needed services at home, patient agreeable to being [facility] by [facility].  Patient also ask  if it would be possible for her to go to SNF on a short term basis and then return home with home care services if available , she also states I know if no home health services are available I may not have a choice but to stay at the facility.    Plan Will place [facility] LCSW to follow up on patient request regarding SNF option. Will place call to [facility] regarding initial home visit Will follow up with patient in the next 2 days.    Egbert Garibaldi, RN, [facility] Care Management,Care Management Coordinator  [phone]- Mobile [phone]- Toll Free Main Office

## 2016-12-23 NOTE — Telephone Encounter (Signed)
Spoke with patient.  She is disappointed that she was not a candidate for hospice and then was declined by [facility].  She knows that she will need to go to a nursing home soon.  She is trying to put off that move a bit longer.  My next call will be to the [facility] case manager to better understand her options.

## 2016-12-23 NOTE — Addendum Note (Signed)
Addended by: Moses Manners on: 12/23/2016 03:27 PM   Modules accepted: Orders

## 2016-12-23 NOTE — Telephone Encounter (Signed)
[name], Case Manager with [facility] called to request PCP give her a call back regarding patient's plan of care. Please call [phone].  Clovis Pu, RN

## 2016-12-23 NOTE — Assessment & Plan Note (Signed)
Patient has been refused for services by both [facility] hospice (name?) and by [facility].  Spoke to [facility] rep.  [facility] was possibly going to take her on as a patient from last admit.  Will consult [facility] to see if they are willing to accept.

## 2016-12-23 NOTE — Telephone Encounter (Signed)
Ander Purpura, [facility] left message on nurse line requesting home health orders for [facility].  Patient has a foley cath and unna boots. Please give her a call with questions at [phone].  Clovis Pu, RN

## 2016-12-24 ENCOUNTER — Other Ambulatory Visit: Payer: Self-pay | Admitting: *Deleted

## 2016-12-24 MED FILL — ONE TOUCH ULTRA TEST STRIPS: 50 days supply | Qty: 100 | Fill #3

## 2016-12-24 NOTE — Patient Outreach (Signed)
[facility]) Care Management  12/24/2016  Barbara [name] 31-Oct-1955 [id number]   Placed follow up call to patient , no answer on home phone or mobile number,  able to leave a hipaa compliant message requesting a return call.   1400 Patient returned call , discussed that she has not heard from [facility] yet. Patient again discussed her primary goal is to stay at home a little longer with home health, but acknowledges if services are not available she would be willing to look into going to SNF .   Placed call to [facility] spoke with Bard Herbert she was unable to determine new order request, she was able to locate order from 8/13 but no recent order.   Plan Will place follow up call to PCP office regarding home health referral  to [facility]. Placed call able to leave a message on nurse line.  Will follow up with PCP office and [facility] again in am.

## 2016-12-24 NOTE — Patient Outreach (Signed)
[facility]) Care Management  12/24/2016  Tomeca Addison May 14, 1955 [id number]   CSW was able to make initial contact with patient today to perform phone assessment. CSW is familiar with patient from previous referrals/needs.  CSW discussed the reason for CSW contact and referral is to assist with conversation and considering of SNF placement (possibly for long term placement).  Patient reports having caregivers in the home majority of the time provided by family, friends and some private pay care.  Per patient's report, the caregivers are there: 10pm-9am, 10am-2pm, 5/6pm- 8pm.   We discussed at length the different options including costs (insurance and out of pocket). It is unclear what her financial situation is or what it would allow in regards to Medicaid, loans, etc.   "A nursing home is the last resort". She very humbly and strongly voices that staying home is her preference but is open to CSW pursuing the process of obtaining FL2, bed search, etc.   CSW will communicate with PCP and proceed with FL2  In case she decides to pursue placement.   CSW will plan f/u call later this week with updates.   Reece Levy, MSW, LCSW Clinical Social Worker  [facility] [employer] [phone]

## 2016-12-24 NOTE — Telephone Encounter (Signed)
Yes, order for [facility] through [facility] was sent yesterday and Tia has already sent it through. Leotis Shames

## 2016-12-25 ENCOUNTER — Other Ambulatory Visit: Payer: Self-pay | Admitting: *Deleted

## 2016-12-25 ENCOUNTER — Telehealth: Payer: Self-pay | Admitting: Licensed Clinical Social Worker

## 2016-12-25 ENCOUNTER — Telehealth: Payer: Self-pay | Admitting: Family Medicine

## 2016-12-25 ENCOUNTER — Ambulatory Visit: Payer: Self-pay | Admitting: *Deleted

## 2016-12-25 MED ORDER — DIAZEPAM 5 MG PO TABS: ORAL_TABLET | ORAL | 5 refills | Status: AC

## 2016-12-25 MED FILL — diazePAM 5 MG TABS: 5 | 22 days supply | Qty: 90 | Fill #0

## 2016-12-25 NOTE — Patient Outreach (Addendum)
[facility]) Care Management  12/25/2016  Barbara [name] June 23, 1955 [id number]  Follow up [facility] call to [facility] regarding whether they had been able to review order for services. Spoke with [place], he states he was not able to see order, he also followed up with intake team. Will follow up with PCP team regarding order.  1100 Unsuccessful attempt to contact patient by telephone, on home and mobile number able to leave a message requesting return call  1200 Placed call to PCP office, able to leave a message on [name] RN line requesting a return call  . Received return call from Leotis Shames, just completed huddle session with PCP, LCSW, regarding patient , she discussed concern regarding also attempting to find home heath agency agreeable to follow patient , she has been declined by [facility] and Dr.Hensel will follow up with [name] today.  She reviewed home health agencies that have declined services. I discussed call to [facility] health  on 8/21 regarding whether they will be able  to follow patient, they will not be able to provide services. I mentioned [facility] [place], she noted contact.  Discussed concern regarding patient care needs , UNNA boot on since 8/9 , and foley catheter, she will discuss with Dr.Hensel. I discussed my plan to contact patient regarding home visit, but limited in being able to provide [facility] boot dressing change.   Plan Will await return call from patient , and attempt re contact regarding home visit.   1400  Patient returned call, discussed being in agreement with home visit , unless home health plans a visit on tomorrow. Request call before my scheduled visit.   Plan Will tentatively  schedule home visit for tomorrow, will call patient prior to visit, to see if she is agreeable or if  home health to visit .    Egbert Garibaldi, RN, [facility] Care  Management,Care Management Coordinator  [phone]- Mobile [phone]- Toll Free Main Office

## 2016-12-25 NOTE — Telephone Encounter (Signed)
p is calling because she needs orders faxed to [facility] so that they can start seeing her. She is also wanting to know why [name] denied her Diazepam. Please call patient and also send in orders to [facility]. jw

## 2016-12-25 NOTE — Patient Outreach (Signed)
[facility]) Care Management  12/25/2016  Barbara [name] 08-28-1955 [id number]   CSW spoke with [facility] office LCSW and briefed her on the plans for home health assessment/services in home -vs- SNF placement. An FL2 will be produced and on "standby" for when/if she decides to go.  [facility] CSW plans to assess her finances with her to get a better idea of her eligibility and need for Medicaid.   CSW left voicemail for patient today to update her on above and will try again later this week.   Reece Levy, MSW, LCSW Clinical Social Worker  [facility] [employer] [employer] [phone]

## 2016-12-25 NOTE — Progress Notes (Signed)
Phone call from [place], [facility] social worker for coordination of services. Requesting  LCSW for assistance with obtaining an FL2 from PCP if patient agrees to long-term care placement.  Patient only willing to peruse placement if [facility] is not an option.  Updated provided to PCP   Plan: LCSW will assist with obtaining FL2 once patient decides she is willing to move forward with placement.   Sammuel Hines, LCSW Licensed Clinical Social Worker [facility]   [phone] 10:37 AM

## 2016-12-25 NOTE — Telephone Encounter (Signed)
Called patient.  I did not deny the diazepam.  I called in a refill.   Informed that we likely have run out of options for home care.  I will make one last ditch effort to get [facility] involved.

## 2016-12-26 ENCOUNTER — Ambulatory Visit: Payer: Self-pay | Admitting: *Deleted

## 2016-12-26 NOTE — Telephone Encounter (Signed)
I called contact at [facility] who confirmed that they could not take on the patient.  The issue is that she needs 24 hour supervision, which they do not supply.  She suggested [facility] which has a 24 hour program.

## 2016-12-27 ENCOUNTER — Encounter: Payer: Self-pay | Admitting: Licensed Clinical Social Worker

## 2016-12-27 ENCOUNTER — Telehealth: Payer: Self-pay | Admitting: *Deleted

## 2016-12-27 ENCOUNTER — Encounter: Payer: Self-pay | Admitting: *Deleted

## 2016-12-27 ENCOUNTER — Other Ambulatory Visit: Payer: Self-pay | Admitting: *Deleted

## 2016-12-27 NOTE — Progress Notes (Addendum)
LCSW received call from [facility] with [facility] for coordination of care/services.  Request for LCSW to assist with obtaining FL2.   [facility] unable to take patient and declined referral.    LCSW spoke to [facility] referral coordinator who has been in contact with [name] at [facility]. [name] has not declined referral at this time but needs additional information.  [name] contacted patient to obtain the information and patient indicated she only needed personal care services.   [name], [facility] care manager to provide an update.  [name] agrees to call contact [name] [phone] at [facility] to provide needed information. This will determine if [name] will accept the [facility] referral.  HH is temp. patient plans to go to nursing home for long-term care.  Plan: LCSW will start FL2 and send to PCP to review and sign  Sammuel Hines, LCSW Licensed Clinical Social Worker [facility]   [phone] 2:35 PM

## 2016-12-27 NOTE — NC FL2 (Signed)
[place] MEDICAID FL2 LEVEL OF CARE SCREENING TOOL     IDENTIFICATION  Patient Name: Barbara [name] Birthdate: 1956/03/03 Sex: female Admission Date (Current Location):   [place] Number:  [employer] and Address:         Provider Number: [id number]  Attending Physician Name and Address:  Dr. Doralee Albino           [facility]                 [address] [place] [address]  Relative Name and Phone Number:       Current Level of Care: Home Recommended Level of Care: Skilled Nursing Facility Prior Approval Number:    Date Approved/Denied:   PASRR Number: [id number]  Discharge Plan:      Current Diagnoses: Patient Active Problem List   Diagnosis Date Noted  . Heart failure, diastolic, chronic (HCC) 12/16/2016  . Pressure injury of skin 12/14/2016  . Neurogenic bladder 12/10/2016  . Acute on chronic respiratory failure (HCC) 12/09/2016  . Sepsis (HCC)   . Somnolence   . Mechanical complication due to bladder catheter (HCC) 12/08/2016  . Hematuria 12/08/2016  . Bladder catheter infection (HCC) 12/08/2016  . Pneumonia due to organism 12/08/2016  . Adverse reaction to antibiotic - Vancomycin 12/08/2016  . Hypoxia 12/07/2016  . UTI (urinary tract infection) 12/07/2016  . Chronic indwelling Foley catheter 11/29/2016  . Cellulitis, leg 09/24/2016  . Lower extremity weakness 04/02/2016  . Palliative care encounter 03/07/2016  . Current chronic use of systemic steroids 03/07/2016  . DM type 2, not at goal [facility]) 03/06/2016  . Weakness of both lower extremities   . Absence of bladder continence   . Essential hypertension   . Encounter for chronic pain management 04/21/2014  . Chronic venous insufficiency 11/05/2012  . Cervical myelopathy (HCC) 08/07/2011  . Spinal stenosis in cervical region 06/03/2011  . Multiple sclerosis (HCC) 12/07/2010  . 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 Spinal stenosis of lumbar region 12/07/2010  .  Morbid obesity (HCC) 12/07/2010  . Chronic gout 12/07/2010    Orientation RESPIRATION BLADDER Height & Weight     Self, Time, Situation, Place  O2 (2L) Indwelling catheter Weight:  328lb Height:   5'3"  BEHAVIORAL SYMPTOMS/MOOD NEUROLOGICAL BOWEL NUTRITION STATUS      Incontinent Diet (Carb modified)  AMBULATORY STATUS COMMUNICATION OF NEEDS Skin   Total Care Verbally Skin abrasions, Other (Comment) (wound on left leg; bilateral UNA Boots)                       Personal Care Assistance Level of Assistance  Bathing, Feeding, Dressing Bathing Assistance: Maximum assistance Feeding assistance: Independent Dressing Assistance: Maximum assistance     Functional Limitations Info  Sight, Hearing, Speech Sight Info: Impaired Hearing Info: Adequate Speech Info: Adequate    SPECIAL CARE FACTORS FREQUENCY  PT (By licensed PT)     PT Frequency: 5 X's  weekly OT Frequency: 5 X's weekly            Contractures      Additional Factors Info  Allergies   Allergies Info: Vancomycin , Metformin and related           Current Medications (12/27/2016):  This is the current active medication list Current Outpatient Prescriptions  Medication Sig Dispense Refill  . Apple Cider Vinegar 188 MG CAPS Take 188 mg by mouth 2 (two) times daily.     Marland Kitchen  aspirin EC 325 MG tablet Take 325 mg by mouth every evening.     . Cholecalciferol (VITAMIN D3) 1000 units CAPS Take 4,000 Units by mouth every evening.     . Coenzyme Q10 (COQ10) 100 MG CAPS Take 100 mg by mouth every evening.    . diazepam (VALIUM) 5 MG tablet TAKE 1 TABLET BY MOUTH EVERY 6 HOURS AS NEEDED FOR LEG SPASMS 90 tablet 5  . furosemide (LASIX) 20 MG tablet Take 1 tablet (20 mg total) by mouth 2 (two) times daily. 30 tablet 2  . gabapentin (NEURONTIN) 300 MG capsule Take 1 capsule (300 mg total) by mouth 3 (three) times daily. 90 capsule 0  . ibuprofen (ADVIL,MOTRIN) 400 MG tablet Take 1 tablet (400 mg total) by mouth every 6  (six) hours as needed for headache or moderate pain. (Patient not taking: Reported on 12/07/2016) 30 tablet 0  . insulin aspart (NOVOLOG) 100 UNIT/ML FlexPen 10 units [place] for every 100 mg/dL above fasting 446 mg/dl once in morning 15 mL 11  . Insulin Glargine (LANTUS) 100 UNIT/ML Solostar Pen Inject 10 Units into the skin daily at 10 pm. (Patient taking differently: Inject 13 Units into the skin at bedtime. ) 15 mL 11  . levofloxacin (LEVAQUIN) 500 MG tablet Take 1 tablet (500 mg total) by mouth daily. 4 tablet 0  . Magnesium 250 MG TABS Take 250 mg by mouth every evening.     . methocarbamol (ROBAXIN) 500 MG tablet Take 0.5 tablets (250 mg total) by mouth 3 (three) times daily as needed for muscle spasms. 90 tablet 0  . Multiple Vitamin (MULTIVITAMIN WITH MINERALS) TABS tablet Take 1 tablet by mouth every evening.    . nitrofurantoin (MACRODANTIN) 50 MG capsule Take 1 capsule (50 mg total) by mouth 2 (two) times daily. 60 capsule 12  . predniSONE (DELTASONE) 5 MG tablet Take 3 tablets (15 mg total) by mouth daily with breakfast. 30 tablet 0  . traMADol (ULTRAM) 50 MG tablet Take 1 tablet (50 mg total) by mouth every 8 (eight) hours as needed. 90 tablet 0   No current facility-administered medications for this visit.      Discharge Medications:   Relevant Imaging Results:  Relevant Lab Results:   Additional Information

## 2016-12-27 NOTE — Patient Outreach (Signed)
[facility]) Care Management   12/27/2016  Barbara [name] [birth year] [id number]  Barbara [name] is an 61 y.o. female  Subjective:  Patient discussed her goal of being able to have home health services and remain home a little longer.     Objective:  BP 130/80 (BP Location: Right Arm, Patient Position: Supine, Cuff Size: Large)   Pulse 78   Resp 18   LMP 10/07/2010   SpO2 92% Comment: 2 liters  Patient resting in recliner chair on arrival .  Review of Systems  Constitutional: Negative.   HENT: Negative.   Eyes:       Complaint of decreased vision   Respiratory: Negative.   Cardiovascular: Positive for leg swelling.  Gastrointestinal: Negative.   Genitourinary: Negative.   Skin: Negative.   Neurological:       Legs spasms   Endo/Heme/Allergies: Negative.   Psychiatric/Behavioral: Negative.     Physical Exam  Constitutional: She is oriented to person, place, and time. She appears well-developed and well-nourished.  Cardiovascular: Normal rate, regular rhythm and normal heart sounds.   Respiratory: Effort normal and breath sounds normal.  GI: Soft. Bowel sounds are normal.  Genitourinary:  Genitourinary Comments: Foley catheter in place , amber colored urine   Neurological: She is alert and oriented to person, place, and time.  Skin: Skin is warm and dry.     Psychiatric: She has a normal mood and affect. Her behavior is normal. Judgment and thought content normal.     Encounter Medications:   Outpatient Encounter Prescriptions as of 12/27/2016  Medication Sig Note  . Apple Cider Vinegar 188 MG CAPS Take 188 mg by mouth 2 (two) times daily.    Marland Kitchen aspirin EC 325 MG tablet Take 325 mg by mouth every evening.    . Cholecalciferol (VITAMIN D3) 1000 units CAPS Take 4,000 Units by mouth every evening.    . Coenzyme Q10 (COQ10) 100 MG CAPS Take 100 mg by mouth every evening.   . diazepam (VALIUM) 5 MG tablet TAKE 1 TABLET BY MOUTH EVERY 6 HOURS AS  NEEDED FOR LEG SPASMS   . furosemide (LASIX) 20 MG tablet Take 1 tablet (20 mg total) by mouth 2 (two) times daily.   Marland Kitchen gabapentin (NEURONTIN) 300 MG capsule Take 1 capsule (300 mg total) by mouth 3 (three) times daily.   Marland Kitchen ibuprofen (ADVIL,MOTRIN) 400 MG tablet Take 1 tablet (400 mg total) by mouth every 6 (six) hours as needed for headache or moderate pain. (Patient not taking: Reported on 12/07/2016)   . insulin aspart (NOVOLOG) 100 UNIT/ML FlexPen 10 units [place] for every 100 mg/dL above fasting 409 mg/dl once in morning   . Insulin Glargine (LANTUS) 100 UNIT/ML Solostar Pen Inject 10 Units into the skin daily at 10 pm. (Patient taking differently: Inject 13 Units into the skin at bedtime. )   . levofloxacin (LEVAQUIN) 500 MG tablet Take 1 tablet (500 mg total) by mouth daily.   . Magnesium 250 MG TABS Take 250 mg by mouth every evening.    . methocarbamol (ROBAXIN) 500 MG tablet Take 0.5 tablets (250 mg total) by mouth 3 (three) times daily as needed for muscle spasms.   . Multiple Vitamin (MULTIVITAMIN WITH MINERALS) TABS tablet Take 1 tablet by mouth every evening.   . nitrofurantoin (MACRODANTIN) 50 MG capsule Take 1 capsule (50 mg total) by mouth 2 (two) times daily. 10/23/2016: Currently has no end date.   . predniSONE (DELTASONE) 5 MG tablet Take 3  tablets (15 mg total) by mouth daily with breakfast.   . traMADol (ULTRAM) 50 MG tablet Take 1 tablet (50 mg total) by mouth every 8 (eight) hours as needed.    No facility-administered encounter medications on file as of 12/27/2016.     Functional Status:   In your present state of health, do you have any difficulty performing the following activities: 12/20/2016 12/07/2016  Hearing? N N  Vision? Y N  Difficulty concentrating or making decisions? N N  Walking or climbing stairs? Y Y  Comment - -  Dressing or bathing? Y Y  Comment - -  Doing errands, shopping? Y Y  Comment - -  Preparing Food and eating ? Y -  Comment - -  Using the Toilet?  Y -  In the past six months, have you accidently leaked urine? Y -  Comment has foley -  Do you have problems with loss of bowel control? Y -  Comment - -  Managing your Medications? N -  Managing your Finances? N -  Housekeeping or managing your Housekeeping? Y -  Comment has assistance with households chores, family, hired assistance -  Some recent data might be hidden    Fall/Depression Screening:    Fall Risk  12/20/2016 11/01/2016 10/03/2016  Falls in the past year? Yes Yes Yes  Number falls in past yr: 1 1 1   Injury with Fall? - Yes Yes  Comment - - -  Risk Factor Category  - High Fall Risk High Fall Risk  Risk for fall due to : Other (Comment) History of fall(s);Other (Comment) Impaired balance/gait;Impaired mobility  Risk for fall due to: Comment patient is immobile spastic paraplegic  -  Follow up Falls prevention discussed Falls prevention discussed Falls prevention discussed   PHQ 2/9 Scores 12/27/2016 10/03/2016 08/06/2016 05/14/2016 03/06/2016 02/09/2015 04/20/2014  PHQ - 2 Score 1 0 0 0 1 0 0    Assessment:    Bilateral lower legs with UNNA boot Dressing in place since 8/9, MD notification on recommendations. Approval received to remove dressing, right leg with decreased swelling, no broken skin areas noted, Left leg with more swelling than right ,  applied kerilix wrap and light ace bandage to bilateral legs, Barbara [name] foam dressing applied to left leg where appears a ruptured blister prior to kerlix wrap. Need plan for ongoing wound care .  Patient gave verbal agreement to take picture of wound if needed to send  PCP.   Diabetes Reports today's blood sugar 365, states she possibly went to sleep prior to taking insulin on last night, reports taking 16 units of novolog this morning.  Vision - decreased vision especially left eye, using magnifying glass to be able to read.  Recent UTI - urine amber colored, no leaking noted at site and caregiver denies noting leaking. Complains of  some lower abdominal discomfort she blames it on previous lovenox injection no bruising noted.  Reports not drinking enough fluids on yesterday. Need review of foley care. Patient admits to not taking Levaquin as ordered at discharge due to side effects    Home care needs- [name] LCSW working with, LCSW at [facility] practice, on FL2 if Home health is not able to be arranged. Patient remains hopeful for home health, awaiting to hear if [facility] will accept .    1430 Received incoming call from Sammuel Hines, LCSW at [facility] regarding placing call to [facility] health to provide more information regarding patient home health skilled  nursing needs, I placed a call to [facility], transferred to speak with Donia Pounds , discussed patient skilled nursing needs of foley, UNNA boot dressing. Edwina reports Sharlet Salina would be covered by [employer], instead of [employer] notified that office and reports they will not be able to accept patient as they can not staff for nursing needs at this time. She recommended [facility] health care. Placed return call to Sammuel Hines to inform her.    Plan:  1.Placed call to Dr.Hensel office regarding approval to remove UNNA boot and apply kerlix/ace wrap dressing to legs, spoke with [name], RN , approval by MD . Will follow up with patient in the next week by telephone . 2.Reinforced taking medications as prescribed,reviewed prescribed instructions, reinforced with patient to notify MD of consistent elevated blood sugars.  4.Review foley care, symptoms of UTI to notify MD of   5. Continuing to follow up PCP and update patient with new information. Will send PCP visit note.  [facility] CM Care Plan Problem One     Most Recent Value  Care Plan Problem One  Recent hospital admission related UTI  Role Documenting the Problem One  Care Management Coordinator  Care Plan for Problem One  Active  [facility] Long Term Goal   Patient will not  experience a hospital admission in the next 31 days   [facility] Long Term Goal Start Date  12/20/16  Interventions for Problem One Long Term Goal  Reinforced notifying MD of new or worsening of symptoms,   [facility] CM Short Term Goal #1   Patient will report checking blood sugar at least daily in the next 30 days   [facility] CM Short Term Goal #1 Start Date  12/20/16  Interventions for Short Term Goal #1  reinforced checking blood sugar daily   [facility] CM Short Term Goal #2   Patient will be able to state measures to reduce UTI in the next 30 days   [facility] CM Short Term Goal #2 Start Date  12/20/16  Interventions for Short Term Goal #2  Educated patient and caregiver on proper foley catheter care daily,not using bath basin when washing in area, use clean cloths , rinse well.   [facility] CM Short Term Goal #3  Patient will report having resources in place for nursing care in the next 2 weeks   [facility] CM Short Term Goal #3 Start Date  12/23/16  Interventions for Short Tern Goal #3  Follow up calls to [facility] family practice LCSW and Maurine Minister, RN, [facility] Care Management,Care Management Coordinator  [phone]- Mobile [phone]- Toll Free Main Office

## 2016-12-27 NOTE — Telephone Encounter (Signed)
Ander Purpura, RNCM with [facility] [phone]) called from patient's home requesting VO to remove bilat UNNA boots, clean legs, apply dry dressing and wrap with Kerlix and Ace bandage. Discussed with PCP and VO given.  Kinnie Feil, RN, BSN

## 2016-12-27 NOTE — Telephone Encounter (Signed)
Noted and agree. 

## 2016-12-28 ENCOUNTER — Other Ambulatory Visit: Payer: Self-pay | Admitting: *Deleted

## 2016-12-28 NOTE — Patient Outreach (Signed)
[facility]) Care Management  [facility] Social Work  12/28/2016  Barbara [name] April 11, 1956 [id number]  Subjective: "Last resort is to go to a facility".  Objective: CSW to assist patient with commuity based resources to aide in her well-being, quality of life and overall safety/needs.    Encounter Medications:  Outpatient Encounter Prescriptions as of 12/27/2016  Medication Sig Note  . Apple Cider Vinegar 188 MG CAPS Take 188 mg by mouth 2 (two) times daily.    Marland Kitchen aspirin EC 325 MG tablet Take 325 mg by mouth every evening.    . Cholecalciferol (VITAMIN D3) 1000 units CAPS Take 4,000 Units by mouth every evening.    . Coenzyme Q10 (COQ10) 100 MG CAPS Take 100 mg by mouth every evening.   . diazepam (VALIUM) 5 MG tablet TAKE 1 TABLET BY MOUTH EVERY 6 HOURS AS NEEDED FOR LEG SPASMS   . furosemide (LASIX) 20 MG tablet Take 1 tablet (20 mg total) by mouth 2 (two) times daily.   Marland Kitchen gabapentin (NEURONTIN) 300 MG capsule Take 1 capsule (300 mg total) by mouth 3 (three) times daily.   Marland Kitchen ibuprofen (ADVIL,MOTRIN) 400 MG tablet Take 1 tablet (400 mg total) by mouth every 6 (six) hours as needed for headache or moderate pain. (Patient not taking: Reported on 12/07/2016)   . insulin aspart (NOVOLOG) 100 UNIT/ML FlexPen 10 units [place] for every 100 mg/dL above fasting 409 mg/dl once in morning   . Insulin Glargine (LANTUS) 100 UNIT/ML Solostar Pen Inject 10 Units into the skin daily at 10 pm. (Patient taking differently: Inject 13 Units into the skin at bedtime. )   . levofloxacin (LEVAQUIN) 500 MG tablet Take 1 tablet (500 mg total) by mouth daily.   . Magnesium 250 MG TABS Take 250 mg by mouth every evening.    . methocarbamol (ROBAXIN) 500 MG tablet Take 0.5 tablets (250 mg total) by mouth 3 (three) times daily as needed for muscle spasms.   . Multiple Vitamin (MULTIVITAMIN WITH MINERALS) TABS tablet Take 1 tablet by mouth every evening.   . nitrofurantoin (MACRODANTIN) 50 MG capsule Take 1  capsule (50 mg total) by mouth 2 (two) times daily. 10/23/2016: Currently has no end date.   . predniSONE (DELTASONE) 5 MG tablet Take 3 tablets (15 mg total) by mouth daily with breakfast.   . traMADol (ULTRAM) 50 MG tablet Take 1 tablet (50 mg total) by mouth every 8 (eight) hours as needed.    No facility-administered encounter medications on file as of 12/27/2016.     Functional Status:  In your present state of health, do you have any difficulty performing the following activities: 12/20/2016 12/07/2016  Hearing? N N  Vision? Y N  Difficulty concentrating or making decisions? N N  Walking or climbing stairs? Y Y  Comment - -  Dressing or bathing? Y Y  Comment - -  Doing errands, shopping? Y Y  Comment - -  Preparing Food and eating ? Y -  Comment - -  Using the Toilet? Y -  In the past six months, have you accidently leaked urine? Y -  Comment has foley -  Do you have problems with loss of bowel control? Y -  Comment - -  Managing your Medications? N -  Managing your Finances? N -  Housekeeping or managing your Housekeeping? Y -  Comment has assistance with households chores, family, hired assistance -  Some recent data might be hidden    Fall/Depression Screening:  PHQ 2/9 Scores 12/27/2016 10/03/2016 08/06/2016 05/14/2016 03/06/2016 02/09/2015 04/20/2014  PHQ - 2 Score 1 0 0 0 1 0 0    Assessment:  CSW spoke with patient by phone on 12/27/16 several times throughout the day as we discussed and I updated her. At this point, she is still awaiting a home health agency to provide in home therapies, nursing, etc. Dr Cyndia Skeeters office is assisting wit referral to [facility] agency's to see who may be able to follow her/accept her for home care. Patient has nearly 24 hour care in the home per her report to me last week-   I have encouraged her to seek additional hours of in home private duty care so that she will have 24/7 assist.  She plans to work on this diligently over the weekend.   Patient is  willing to go to a SNF as a last resort. CSW has had lengthy conversation with her about this option versus staying home which is her preference but will likely require her to secure around the clock private duty care.    Plan:  FL2 completed and to be signed by PCP.  I will plan to reach out to patient on Monday for update on her private duty care arraangements and proceed as she wishes.   Patient became rather emotional on last phone call 12/27/16 as she realizes the uncertainty of her ability to stay home. Emotional support provided and encouraged her to seek additional help to complete her around the clock care at home.   F/U call to patient planned for Monday.    THN CM Care Plan Problem One     Most Recent Value  Care Plan Problem One  Patient needing 24hour care as she is immobile .  Role Documenting the Problem One  Clinical Social Worker  Care Plan for Problem One  Active  [facility] Long Term Goal   Patient will have adequate support in the home or be placed in long term SNF.  THN Long Term Goal Start Date  12/27/16  Interventions for Problem One Long Term Goal  CSW is assisting with SNF process in case home remains inadeqate.  THN CM Short Term Goal #1   Patient will arrange 24/7 care in the home in the next 30 days.  THN CM Short Term Goal #1 Start Date  12/27/16  Interventions for Short Term Goal #1  CSW has discussed and encouraged the need for 24/7 private duty care in order to be safe and able to receive [facility] and remain home.  THN CM Short Term Goal #2   Patient will consider SNF placement for LTC if unable to secure 24/7 care at home in the next 30 days.  THN CM Short Term Goal #2 Start Date  12/27/16  Interventions for Short Term Goal #2  CSW assisting with FL2 completion, SNF search and placement if needed/agreed upon by patient.  THN CM Short Term Goal #3     Interventions for Short Tern Goal #3        Reece Levy, MSW, LCSW Clinical Social Worker  [facility] [employer] [phone]

## 2016-12-29 DIAGNOSIS — R531 Weakness: Secondary | ICD-10-CM | POA: Diagnosis not present

## 2016-12-29 DIAGNOSIS — M4802 Spinal stenosis, cervical region: Secondary | ICD-10-CM | POA: Diagnosis not present

## 2016-12-29 DIAGNOSIS — E119 Type 2 diabetes mellitus without complications: Secondary | ICD-10-CM | POA: Diagnosis not present

## 2016-12-29 DIAGNOSIS — G35 Multiple sclerosis: Secondary | ICD-10-CM | POA: Diagnosis not present

## 2016-12-30 ENCOUNTER — Other Ambulatory Visit: Payer: Self-pay | Admitting: *Deleted

## 2016-12-30 MED FILL — FUROSEMIDE 20 MG TABLET: 20 | 15 days supply | Qty: 30 | Fill #1

## 2016-12-30 MED FILL — NITROFURANTOIN MCR 50 MG CA: 50 | 30 days supply | Qty: 60 | Fill #2

## 2016-12-30 NOTE — Patient Outreach (Signed)
[facility]) Care Management  12/30/2016  Barbara [name] 07-Sep-1955 [id number]   CSW contacted patient for a phone follow up after call on Friday where we discussed at length her options. She is still wanting to stay home if at all possible and is working to arrange 24/7 care at home.  CSW is also continuing to work on possible SNF placement.  CSW plans in home visit tomorrow to complete financial assessment for long term planning including Medicaid determination.   Patient agreeable to visit tomorrow in the home.    Reece Levy, MSW, LCSW Clinical Social Worker  [facility] [employer] [phone]

## 2016-12-31 ENCOUNTER — Encounter: Payer: Self-pay | Admitting: *Deleted

## 2016-12-31 ENCOUNTER — Other Ambulatory Visit: Payer: Self-pay | Admitting: *Deleted

## 2016-12-31 ENCOUNTER — Telehealth: Payer: Self-pay | Admitting: Licensed Clinical Social Worker

## 2016-12-31 NOTE — Patient Outreach (Signed)
[facility]) Care Management  [facility] Social Work  12/31/2016  Barbara [name] 1955/09/18 [id number]  Subjective:  "I want to stay at home with my pets if at all possible".  Objective: CSW to assist patient with commuity based resources to aide in her well-being, quality of life and overall safety/needs.    Encounter Medications:  Outpatient Encounter Prescriptions as of 12/31/2016  Medication Sig Note  . Apple Cider Vinegar 188 MG CAPS Take 188 mg by mouth 2 (two) times daily.    Marland Kitchen aspirin EC 325 MG tablet Take 325 mg by mouth every evening.    . Cholecalciferol (VITAMIN D3) 1000 units CAPS Take 4,000 Units by mouth every evening.    . Coenzyme Q10 (COQ10) 100 MG CAPS Take 100 mg by mouth every evening.   . diazepam (VALIUM) 5 MG tablet TAKE 1 TABLET BY MOUTH EVERY 6 HOURS AS NEEDED FOR LEG SPASMS   . furosemide (LASIX) 20 MG tablet Take 1 tablet (20 mg total) by mouth 2 (two) times daily.   Marland Kitchen gabapentin (NEURONTIN) 300 MG capsule Take 1 capsule (300 mg total) by mouth 3 (three) times daily.   Marland Kitchen ibuprofen (ADVIL,MOTRIN) 400 MG tablet Take 1 tablet (400 mg total) by mouth every 6 (six) hours as needed for headache or moderate pain. (Patient not taking: Reported on 12/07/2016)   . insulin aspart (NOVOLOG) 100 UNIT/ML FlexPen 10 units [place] for every 100 mg/dL above fasting 200 mg/dl once in morning   . Insulin Glargine (LANTUS) 100 UNIT/ML Solostar Pen Inject 10 Units into the skin daily at 10 pm. (Patient taking differently: Inject 13 Units into the skin at bedtime. )   . levofloxacin (LEVAQUIN) 500 MG tablet Take 1 tablet (500 mg total) by mouth daily.   . Magnesium 250 MG TABS Take 250 mg by mouth every evening.    . methocarbamol (ROBAXIN) 500 MG tablet Take 0.5 tablets (250 mg total) by mouth 3 (three) times daily as needed for muscle spasms.   . Multiple Vitamin (MULTIVITAMIN WITH MINERALS) TABS tablet Take 1 tablet by mouth every evening.   . nitrofurantoin (MACRODANTIN)  50 MG capsule Take 1 capsule (50 mg total) by mouth 2 (two) times daily. 10/23/2016: Currently has no end date.   . predniSONE (DELTASONE) 5 MG tablet Take 3 tablets (15 mg total) by mouth daily with breakfast.   . traMADol (ULTRAM) 50 MG tablet Take 1 tablet (50 mg total) by mouth every 8 (eight) hours as needed.    No facility-administered encounter medications on file as of 12/31/2016.     Functional Status:  In your present state of health, do you have any difficulty performing the following activities: 12/20/2016 12/07/2016  Hearing? N N  Vision? Y N  Difficulty concentrating or making decisions? N N  Walking or climbing stairs? Y Y  Comment - -  Dressing or bathing? Y Y  Comment - -  Doing errands, shopping? [place] and eating ? Y -  Comment - -  Using the Toilet? Y -  In the past six months, have you accidently leaked urine? Y -  Comment has foley -  Do you have problems with loss of bowel control? Y -  Comment - -  Managing your Medications? N -  Managing your Finances? N -  Housekeeping or managing your Housekeeping? Y -  Comment has assistance with households chores, family, hired assistance -  Some recent data might be  hidden    Fall/Depression Screening:  PHQ 2/9 Scores 12/27/2016 10/03/2016 08/06/2016 05/14/2016 03/06/2016 02/09/2015 04/20/2014  PHQ - 2 Score 1 0 0 0 1 0 0    Assessment:  CSW met with patient in her home today. CSW completed psychosocial assessment. Patient reports she has spoken with her caregiver who stays overnight with her and she is willing to move in with her. She reports that with the friend moving in and family/friend support she can and will have 24/7 care at home.  Patient  Tearful throughout visit; admitting that going to a nursing facility would be like "being on death row". She has a fear "of the known" as she states she has been in SNF care before and has an overall negative opinion of this.   Patient understands plans are  in place to secure a [facility] agency that can provide the [facility] RN care she needs.    CSW talked with patient about her quality of life and also some end of life conversations. "I have found homes for all of my pets". This gives her relief to know they will be cared for and in good hands "if the time comes".    Plan: CSW will communicate with Dr Lowella Bandy office and [facility] agency's to determine who can provide the [facility] care she needs and f/u 01/01/17.     [facility] CM Care Plan Problem One     Most Recent Value  Care Plan Problem One  Patient needing 24hour care as she is immobile .  Role Documenting the Problem One  Clinical Social Worker  Care Plan for Problem One  Active  [facility] Long Term Goal   Patient will have adequate support in the home or be placed in long term SNF.  [facility]N Long Term Goal Start Date  12/27/16  [facility] Long Term Goal Met Date  01/01/17  Interventions for Problem One Long Term Goal  CSW is assisting with SNF process in case home remains inadeqate.  [facility] CM Short Term Goal #1   Patient will arrange 24/7 care in the home in the next 30 days.  [facility] CM Short Term Goal #1 Start Date  12/27/16  [facility] CM Short Term Goal #1 Met Date  01/01/17  Interventions for Short Term Goal #1  CSW has discussed and encouraged the need for 24/7 private duty care in order to be safe and able to receive [facility] and remain home.  [facility] CM Short Term Goal #2   Patient will consider SNF placement for LTC if unable to secure 24/7 care at home in the next 30 days.  [facility] CM Short Term Goal #2 Start Date  12/27/16  [facility] CM Short Term Goal #2 Met Date  01/01/17  Interventions for Short Term Goal #2  CSW assisting with FL2 completion, SNF search and placement if needed/agreed upon by patient.  TH[facility] CM Short Term Goal #3     Interventions for Short Tern Goal #3         Eduard Clos, MSW, [place] Worker  [place] [phone]

## 2016-12-31 NOTE — Progress Notes (Addendum)
LCSW received voice message from [place], [facility] Social Worker.   States patient' night provider is moving in with her, she will have 24-hour care starting tomorrow.  Patient wants to know if [facility] would reconsider taking the referral.    LCSW spoke to Tia, referral coordinator who will contact [facility].   Sammuel Hines, LCSW Licensed Clinical Social Worker [facility]   [phone] 3:02 PM

## 2016-12-31 NOTE — Progress Notes (Signed)
Patient declined Home Health services from [facility], [facility] and [facility].    Call to [facility] in [place], spoke to [facility] Intake [phone], per Okey Regal home visit scheduled Thursday Aug 30th, referral faxed.    LCSW called Marylu Lund [facility] LCSW for coordination of services and update.  She will meet with patient today to complete full assessment and provide her an update on [facility] referrals.  Update also shared with PCP via in basket message.    Plan:  LCSW will wait for update from [facility] after home assessment.  Sammuel Hines, LCSW Licensed Clinical Social Worker [facility]   [phone] 9:34 AM

## 2016-12-31 NOTE — Patient Outreach (Addendum)
[facility]) Care Management  12/31/2016  Barbara [name] Oct 06, 1955 [id number]   Transition of care   Spoke with patient reports she is doing fine.  Patient reports she arranged 24 hour coverage for home that can start on next week and remains hopeful that she will be able to have home health services as her primary goal to remain at home a little longer, discussed plan of skilled facility if home health services not available.   Patient reports her blood sugar was 235 this morning after breakfast, no episodes of low blood sugar symptoms. Discussed foley catheter, she reports "so far so good", denies noting any  leaking, reports urine yellow colored,and good , usual volume output.Patient denies discomfort at site.  Patient report kerlix and ace bandage wraps in place to legs, caregiver has reinforced dressing on left leg once. Patient unable to determine increased swelling in legs or leaking at blister area left leg. Discussed offered return home visit for dressing to legs,patient is hopeful for home health services.   Patient states she believes the vision in her left eye is a little better , a little clearer when using the magnifying glass not as cloudy as it was when reading.    Plan Will follow up with patient in the next week by telephone ,encouraged patient to notify of concerns if needed sooner.     Egbert Garibaldi, RN, [facility] Care Management,Care Management Coordinator  [phone]- Mobile [phone]- Toll Free Main Office

## 2017-01-01 ENCOUNTER — Other Ambulatory Visit: Payer: Self-pay | Admitting: *Deleted

## 2017-01-01 NOTE — Patient Outreach (Signed)
[facility]) Care Management  01/01/2017  Barbara [name] February 03, 1956 [id number]   Incoming call from Barbara Sons, LCSW at [facility] family practice, requesting follow up call to [name] from [facility] regarding patient wound, home care needs.  Placed call to Barbara [name] to provide information, she informed me that they will not be able to take patient on for services due to not having clinical staff available in area. Barbara [name] states she has notified Barbara [name] at Dr.Hensel office at [facility] practice center.   Plan Updated Barbara [name] , LCSW [facility]. Will send inbasket to Barbara [name]. LCSW regarding [facility] will not be able to provide services.   Barbara Garibaldi, RN, [facility] Care Management,Care Management Coordinator  [phone]- Mobile [phone]- Toll Free Main Office

## 2017-01-01 NOTE — Patient Outreach (Signed)
[facility]) Care Management  01/01/2017  Barbara [name] [birth year] [id number]  CSW updated patient that [facility] cannot staff her care due to location and no clinical staff available. Patient agrees to seeking home health care services with any agency that can provide care. Dr Cyndia Skeeters office has made the [facility] referral to [facility], [phone]. I have confirmed with their office that their Nurse will be calling and going to see her tomorrow and will take care of any orders/needs provided on the referral (catheter care, wound care, etc).  Patient confirms she has been able to get her 24/7 in home caregiver support arranged and "all is good".   CSW updated Dr Cyndia Skeeters CSW and will also update [facility] RNCM.  CSW will plan f/u call tomorrow for update on [facility] visit.       Reece Levy, MSW, LCSW Clinical Social Worker  [facility] [employer] [phone]

## 2017-01-02 ENCOUNTER — Other Ambulatory Visit: Payer: Self-pay | Admitting: *Deleted

## 2017-01-02 DIAGNOSIS — G35 Multiple sclerosis: Secondary | ICD-10-CM | POA: Diagnosis not present

## 2017-01-02 DIAGNOSIS — L97821 Non-pressure chronic ulcer of other part of left lower leg limited to breakdown of skin: Secondary | ICD-10-CM | POA: Diagnosis not present

## 2017-01-02 DIAGNOSIS — I872 Venous insufficiency (chronic) (peripheral): Secondary | ICD-10-CM | POA: Diagnosis not present

## 2017-01-02 DIAGNOSIS — N319 Neuromuscular dysfunction of bladder, unspecified: Secondary | ICD-10-CM | POA: Diagnosis not present

## 2017-01-02 DIAGNOSIS — G8220 Paraplegia, unspecified: Secondary | ICD-10-CM | POA: Diagnosis not present

## 2017-01-02 DIAGNOSIS — Z466 Encounter for fitting and adjustment of urinary device: Secondary | ICD-10-CM | POA: Diagnosis not present

## 2017-01-02 NOTE — Patient Outreach (Signed)
[facility]) Care Management  01/02/2017  Jamilia Toruno 05-07-1955 [id number]   CSW contacted patient today who reports, "everything is great".  [facility] came to do initial assessment and has confirmed they have accepted the referral for [facility] care.  She reports the Nurse was extremely nice, compassionate and on board to make sure she gets the services she needs (primarily starting with wound and catheter care).  "I started crying I was so excited".  Patient very appreciative of everyone's support and assistance to make this happen.  CSW will plan f/u call next week for check in and to further assess and assist with services/support to aide in her quality of life and ability to remain in the home with 24 hour care.   CSW will update PCP office and [facility] RNCM.   Reece Levy, MSW, LCSW Clinical Social Worker  [facility] [employer] [phone]

## 2017-01-03 ENCOUNTER — Other Ambulatory Visit: Payer: Self-pay | Admitting: *Deleted

## 2017-01-03 ENCOUNTER — Telehealth: Payer: Self-pay | Admitting: *Deleted

## 2017-01-03 NOTE — Telephone Encounter (Signed)
Gearldine Bienenstock, RN with [facility] called to request verbal order for Home Health Aid to assist with bathing. Verbal order given.  Clovis Pu, RN

## 2017-01-03 NOTE — Telephone Encounter (Signed)
Noted and agree. 

## 2017-01-03 NOTE — Patient Outreach (Signed)
[facility]) Care Management  01/03/2017  Barbara [name] December 31, 1955 [id number]   CSW called patient to talk about possible alternate means of transportation as she is currently using non emergency ambulance to get to/from appointments which she reports runs about $200 each way.  CSW discussed the specifics of  Logistically getting her in the motorized scooter in the house, down the outdoor lift to be picked up in a wheelchair lift [place].  CSW plans investigate possible Zenaida Niece service options and report back to patient for consideration.   Reece Levy, MSW, LCSW Clinical Social Worker  [facility] [employer] [phone]

## 2017-01-07 ENCOUNTER — Other Ambulatory Visit: Payer: Self-pay | Admitting: *Deleted

## 2017-01-07 NOTE — Patient Outreach (Signed)
[facility]) Care Management  01/07/2017  Barbara [name] [birth year] [id number]  Transition of care call  Spoke with patent she discussed being pleased with being able to have home health services in place to allow her to remain at home a while longer. Patient reports she does have someone with her 24 hours a day worked out.   Patient reports [facility] care RN has visited on today, foley catheter has been replaced and UNNA boots applied. Patient reports she will also have a bath aide as part of service.   Patient discussed her vision problem is about the same using magnifying glass to help with reading , patient discussed her plan of checking into vision plan then scheduling a office visit in [place] area using [employer] transportation.  Patient reports she has been in her scooter in the last few weeks.  Discussed plans for office visit with PCP, patient recalls her post discharge visit was cancelled. She plans to focus on getting follow up eye appointment .   Patient reports her blood sugar was 235 on today a little higher than it has been running in the 190's. Patient reports taking insulin as prescribed.    Plan Will follow up with patient in the next week by telephone for transition of care call Will follow up on plan for PCP visit .   Egbert Garibaldi, RN, [facility] Care Management,Care Management Coordinator  [phone]- Mobile [phone]- Toll Free Main Office

## 2017-01-08 ENCOUNTER — Telehealth: Payer: Self-pay | Admitting: *Deleted

## 2017-01-08 ENCOUNTER — Other Ambulatory Visit: Payer: Self-pay | Admitting: *Deleted

## 2017-01-08 NOTE — Telephone Encounter (Signed)
Dalbert Mayotte, RN with [facility] left message on nurse line stating patient has wound right calf that is draining large amount of tan/green fluid. Is malodorous. Odor decreases after cleaning wound but continues to drain bloody fluid that soaks through [employer] and chux. Also has "black hematoma" inner aspect of left foot that is swollen and raised. This occurred after dog stepped on her foot. Please call Olegario Messier at [phone] with any questions or directions.  Kinnie Feil, RN, BSN

## 2017-01-08 NOTE — Telephone Encounter (Signed)
Called and discussed.  This if first visit for nurse Olegario Messier.  Would did not appear infected.  She will revisit in the morning.  No change in therapy for now.

## 2017-01-09 ENCOUNTER — Other Ambulatory Visit: Payer: Self-pay | Admitting: *Deleted

## 2017-01-09 ENCOUNTER — Telehealth: Payer: Self-pay | Admitting: *Deleted

## 2017-01-09 DIAGNOSIS — N319 Neuromuscular dysfunction of bladder, unspecified: Secondary | ICD-10-CM

## 2017-01-09 NOTE — Telephone Encounter (Signed)
Olegario Messier with [facility] states that she went out to patient's home today and noticed a "white sediment in and around" her catheter bag.  Patient informed her that this is normal and that previous angencies have just flushed the catheter.  Olegario Messier would like to know if she needs to flush the line and/or get urine cultures to check for infection.  Patient is complaining of low back pain, but stated it was normal.  Will forward to MD to advise on what the next step needs to be and also given a verbal ok to get it done.  Jazmin Hartsell,CMA

## 2017-01-09 NOTE — Patient Outreach (Signed)
[facility]) Care Management  [facility] Social Work  01/09/2017  Barbara [name] [birth year] [id number]  Subjective:  "Everything is going well".  Objective: CSW to assist patient with commuity based resources to aide in her well-being, quality of life and overall safety/needs.    Current Medications:  Current Outpatient Prescriptions  Medication Sig Dispense Refill  . Apple Cider Vinegar 188 MG CAPS Take 188 mg by mouth 2 (two) times daily.     Marland Kitchen aspirin EC 325 MG tablet Take 325 mg by mouth every evening.     . Cholecalciferol (VITAMIN D3) 1000 units CAPS Take 4,000 Units by mouth every evening.     . Coenzyme Q10 (COQ10) 100 MG CAPS Take 100 mg by mouth every evening.    . diazepam (VALIUM) 5 MG tablet TAKE 1 TABLET BY MOUTH EVERY 6 HOURS AS NEEDED FOR LEG SPASMS 90 tablet 5  . furosemide (LASIX) 20 MG tablet Take 1 tablet (20 mg total) by mouth 2 (two) times daily. 30 tablet 2  . gabapentin (NEURONTIN) 300 MG capsule Take 1 capsule (300 mg total) by mouth 3 (three) times daily. 90 capsule 0  . ibuprofen (ADVIL,MOTRIN) 400 MG tablet Take 1 tablet (400 mg total) by mouth every 6 (six) hours as needed for headache or moderate pain. (Patient not taking: Reported on 12/07/2016) 30 tablet 0  . insulin aspart (NOVOLOG) 100 UNIT/ML FlexPen 10 units Burns for every 100 mg/dL above fasting 200 mg/dl once in morning 15 mL 11  . Insulin Glargine (LANTUS) 100 UNIT/ML Solostar Pen Inject 10 Units into the skin daily at 10 pm. (Patient taking differently: Inject 13 Units into the skin at bedtime. ) 15 mL 11  . levofloxacin (LEVAQUIN) 500 MG tablet Take 1 tablet (500 mg total) by mouth daily. 4 tablet 0  . Magnesium 250 MG TABS Take 250 mg by mouth every evening.     . methocarbamol (ROBAXIN) 500 MG tablet Take 0.5 tablets (250 mg total) by mouth 3 (three) times daily as needed for muscle spasms. 90 tablet 0  . Multiple Vitamin (MULTIVITAMIN WITH MINERALS) TABS tablet Take 1 tablet by mouth  every evening.    . nitrofurantoin (MACRODANTIN) 50 MG capsule Take 1 capsule (50 mg total) by mouth 2 (two) times daily. 60 capsule 12  . predniSONE (DELTASONE) 5 MG tablet Take 3 tablets (15 mg total) by mouth daily with breakfast. 30 tablet 0  . traMADol (ULTRAM) 50 MG tablet Take 1 tablet (50 mg total) by mouth every 8 (eight) hours as needed. 90 tablet 0   No current facility-administered medications for this visit.     Functional Status:  In your present state of health, do you have any difficulty performing the following activities: 12/20/2016 12/07/2016  Hearing? N N  Vision? Y N  Difficulty concentrating or making decisions? N N  Walking or climbing stairs? Y Y  Comment - -  Dressing or bathing? Y Y  Comment - -  Doing errands, shopping? [place] and eating ? Y -  Comment - -  Using the Toilet? Y -  In the past six months, have you accidently leaked urine? Y -  Comment has foley -  Do you have problems with loss of bowel control? Y -  Comment - -  Managing your Medications? N -  Managing your Finances? N -  Housekeeping or managing your Housekeeping? Y -  Comment has assistance with households chores, family,  hired assistance -  Some recent data might be hidden    Fall/Depression Screening:  Fall Risk  12/31/2016 12/20/2016 11/01/2016  Falls in the past year? Yes Yes Yes  Number falls in past yr: - 1 1  Injury with Fall? - - Yes  Comment - - -  Risk Factor Category  - - High Fall Risk  Risk for fall due to : - Other (Comment) History of fall(s);Other (Comment)  Risk for fall due to: Comment - patient is immobile spastic paraplegic   Follow up - Falls prevention discussed Falls prevention discussed   PHQ 2/9 Scores 12/27/2016 10/03/2016 08/06/2016 05/14/2016 03/06/2016 02/09/2015 04/20/2014  PHQ - 2 Score 1 0 0 0 1 0 0    Assessment:  CSW spoke with patient by phone who reports everything is going well. Her Home health therapy, nursing and caregivers  are all in place and she voices no concerns or needs at this time. CSW discussed and offered to look into an in-home Physician service but she does not want to pursue this at this time. CSW also shared with her an alternative wheelchair Lucianne Lei transportation service that would cost $250 round trip (as opposed to the $400 EMS round trip) for her to consider when and if needed.    Plan: At this time,patient denies any concerns or needs from [place]. Will plan f/u call next week for further assessment.   [facility] CM Care Plan Problem One     Most Recent Value  Care Plan Problem One  Patient needing 24hour care as she is immobile .  Role Documenting the Problem One  Clinical Social Worker  Care Plan for Problem One  Active  [facility] Long Term Goal   Patient will have adequate support in the home or be placed in long term SNF.  [facility] Long Term Goal Start Date  12/27/16  [facility] Long Term Goal Met Date  01/01/17  Interventions for Problem One Long Term Goal  CSW is assisting with SNF process in case home remains inadeqate.  [facility] CM Short Term Goal #1   Patient will arrange 24/7 care in the home in the next 30 days.  [facility] CM Short Term Goal #1 Start Date  12/27/16  [facility] CM Short Term Goal #1 Met Date  01/01/17  Interventions for Short Term Goal #1  CSW has discussed and encouraged the need for 24/7 private duty care in order to be safe and able to receive [facility] and remain home.  [facility] CM Short Term Goal #2   Patient will consider SNF placement for LTC if unable to secure 24/7 care at home in the next 30 days.  [facility] CM Short Term Goal #2 Start Date  12/27/16  [facility] CM Short Term Goal #2 Met Date  01/01/17  Interventions for Short Term Goal #2  CSW assisting with FL2 completion, SNF search and placement if needed/agreed upon by patient.  [facility] CM Short Term Goal #3     Interventions for Short Tern Goal #3        Eduard Clos, MSW, [place] Worker  [place] [phone]

## 2017-01-10 NOTE — Telephone Encounter (Signed)
Returned call.  OK to flush line prn.  Obtain culture only if symptoms, e.g. Fever, flank pain.

## 2017-01-14 ENCOUNTER — Other Ambulatory Visit: Payer: Self-pay | Admitting: *Deleted

## 2017-01-14 NOTE — Patient Outreach (Signed)
[facility]) Care Management  [facility] Social Work  01/14/2017  Hillarie Harrigan 1955-06-01 [id number]  Subjective:  "doing well and adjusting to all these people"  Objective: CSW to assist patient with commuity based resources to aide in her well-being, quality of life and overall safety/needs.    Encounter Medications:  Outpatient Encounter Prescriptions as of 01/14/2017  Medication Sig Note  . Apple Cider Vinegar 188 MG CAPS Take 188 mg by mouth 2 (two) times daily.    Marland Kitchen aspirin EC 325 MG tablet Take 325 mg by mouth every evening.    . Cholecalciferol (VITAMIN D3) 1000 units CAPS Take 4,000 Units by mouth every evening.    . Coenzyme Q10 (COQ10) 100 MG CAPS Take 100 mg by mouth every evening.   . diazepam (VALIUM) 5 MG tablet TAKE 1 TABLET BY MOUTH EVERY 6 HOURS AS NEEDED FOR LEG SPASMS   . furosemide (LASIX) 20 MG tablet Take 1 tablet (20 mg total) by mouth 2 (two) times daily.   Marland Kitchen gabapentin (NEURONTIN) 300 MG capsule Take 1 capsule (300 mg total) by mouth 3 (three) times daily.   Marland Kitchen ibuprofen (ADVIL,MOTRIN) 400 MG tablet Take 1 tablet (400 mg total) by mouth every 6 (six) hours as needed for headache or moderate pain. (Patient not taking: Reported on 12/07/2016)   . insulin aspart (NOVOLOG) 100 UNIT/ML FlexPen 10 units  for every 100 mg/dL above fasting 200 mg/dl once in morning   . Insulin Glargine (LANTUS) 100 UNIT/ML Solostar Pen Inject 10 Units into the skin daily at 10 pm. (Patient taking differently: Inject 13 Units into the skin at bedtime. )   . levofloxacin (LEVAQUIN) 500 MG tablet Take 1 tablet (500 mg total) by mouth daily.   . Magnesium 250 MG TABS Take 250 mg by mouth every evening.    . methocarbamol (ROBAXIN) 500 MG tablet Take 0.5 tablets (250 mg total) by mouth 3 (three) times daily as needed for muscle spasms.   . Multiple Vitamin (MULTIVITAMIN WITH MINERALS) TABS tablet Take 1 tablet by mouth every evening.   . nitrofurantoin (MACRODANTIN) 50 MG  capsule Take 1 capsule (50 mg total) by mouth 2 (two) times daily. 10/23/2016: Currently has no end date.   . predniSONE (DELTASONE) 5 MG tablet Take 3 tablets (15 mg total) by mouth daily with breakfast.   . traMADol (ULTRAM) 50 MG tablet Take 1 tablet (50 mg total) by mouth every 8 (eight) hours as needed.    No facility-administered encounter medications on file as of 01/14/2017.     Functional Status:  In your present state of health, do you have any difficulty performing the following activities: 12/20/2016 12/07/2016  Hearing? N N  Vision? Y N  Difficulty concentrating or making decisions? N N  Walking or climbing stairs? Y Y  Comment - -  Dressing or bathing? Y Y  Comment - -  Doing errands, shopping? [place] and eating ? Y -  Comment - -  Using the Toilet? Y -  In the past six months, have you accidently leaked urine? Y -  Comment has foley -  Do you have problems with loss of bowel control? Y -  Comment - -  Managing your Medications? N -  Managing your Finances? N -  Housekeeping or managing your Housekeeping? Y -  Comment has assistance with households chores, family, hired assistance -  Some recent data might be hidden    Fall/Depression  Screening:  PHQ 2/9 Scores 12/27/2016 10/03/2016 08/06/2016 05/14/2016 03/06/2016 02/09/2015 04/20/2014  PHQ - 2 Score 1 0 0 0 1 0 0    Assessment:  Patient reports her 24 hour care, home health assistance and overall support is going well. CSW discussed finding things/ways to improve her overall quality of life by identifying some goals (activiites, etc) that CSW may be able to assist her with arranging.  Transportation is a barrier but we are continuing to seek wheelchair Lucianne Lei options and consider alternate options as well.     Plan:   [facility] CM Care Plan Problem One     Most Recent Value  Care Plan Problem One  Patient needing 24hour care as she is immobile .  Role Documenting the Problem One  Clinical Social  Worker  Care Plan for Problem One  Active  [facility] Long Term Goal   Patient will have adequate support in the home or be placed in long term SNF.  [facility] Long Term Goal Start Date  12/27/16  [facility] Long Term Goal Met Date  01/01/17  Interventions for Problem One Long Term Goal  CSW is assisting with SNF process in case home remains inadeqate.  [facility] CM Short Term Goal #1   Patient will arrange 24/7 care in the home in the next 30 days.  [facility] CM Short Term Goal #1 Start Date  12/27/16  [facility] CM Short Term Goal #1 Met Date  01/01/17  Interventions for Short Term Goal #1  CSW has discussed and encouraged the need for 24/7 private duty care in order to be safe and able to receive [facility] and remain home.  [facility] CM Short Term Goal #2   Patient will consider SNF placement for LTC if unable to secure 24/7 care at home in the next 30 days.  [facility] CM Short Term Goal #2 Start Date  12/27/16  [facility] CM Short Term Goal #2 Met Date  01/01/17  Interventions for Short Term Goal #2  CSW assisting with FL2 completion, SNF search and placement if needed/agreed upon by patient.  [facility] CM Short Term Goal #3  Patient to identify ways to increase/improve her quality of life in the next 30days.  [facility] CM Short Term Goal #3 Start Date  01/14/17  Interventions for Short Tern Goal #3  CSW discussed and encouraged patient to think of ways to improve her QOL.       CSW plans f/u call in 2-3 weeks.   Eduard Clos, MSW, [place] Worker  [place] [phone]

## 2017-01-15 ENCOUNTER — Telehealth: Payer: Self-pay | Admitting: Family Medicine

## 2017-01-15 ENCOUNTER — Other Ambulatory Visit: Payer: Self-pay | Admitting: *Deleted
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
## 2017-01-16 DIAGNOSIS — G35 Multiple sclerosis: Secondary | ICD-10-CM | POA: Diagnosis not present

## 2017-01-16 DIAGNOSIS — M6281 Muscle weakness (generalized): Secondary | ICD-10-CM | POA: Diagnosis not present

## 2017-01-16 DIAGNOSIS — R531 Weakness: Secondary | ICD-10-CM | POA: Diagnosis not present

## 2017-01-16 DIAGNOSIS — I5032 Chronic diastolic (congestive) heart failure: Secondary | ICD-10-CM | POA: Diagnosis not present

## 2017-01-16 DIAGNOSIS — E119 Type 2 diabetes mellitus without complications: Secondary | ICD-10-CM | POA: Diagnosis not present

## 2017-01-16 DIAGNOSIS — R0602 Shortness of breath: Secondary | ICD-10-CM | POA: Diagnosis not present

## 2017-01-16 DIAGNOSIS — M4802 Spinal stenosis, cervical region: Secondary | ICD-10-CM | POA: Diagnosis not present

## 2017-01-16 DIAGNOSIS — J9620 Acute and chronic respiratory failure, unspecified whether with hypoxia or hypercapnia: Secondary | ICD-10-CM | POA: Diagnosis not present

## 2017-01-16 DIAGNOSIS — L89152 Pressure ulcer of sacral region, stage 2: Secondary | ICD-10-CM | POA: Diagnosis not present

## 2017-01-16 MED FILL — FUROSEMIDE 20 MG TABLET: 20 | 15 days supply | Qty: 30 | Fill #2

## 2017-01-16 MED FILL — traMADol HCL 50 MG TABS: 50 | 90 days supply | Qty: 360 | Fill #1

## 2017-01-20 NOTE — Telephone Encounter (Signed)
Ms Barbara [name] lives an hour away.  The previous home visit I made took a full afternoon when travel time was included.  Let's see if I can either Skype of Facetime a visit.

## 2017-01-20 NOTE — Telephone Encounter (Signed)
[name], please see note from PCP below. Are either of these options possible? Thanks!

## 2017-01-21 ENCOUNTER — Other Ambulatory Visit: Payer: Self-pay | Admitting: *Deleted

## 2017-01-21 NOTE — Telephone Encounter (Addendum)
Barbara Messier, RN called stating patient's catheter is still clogged. They have changed the catheter 3 times in the last 2 weeks. She went out to patient's home last night to try and unclog the catheter. It can't be flushed, there is sand like sediment in the cath bag and urine has a pinkish-red color.  She also mentioned that the hematoma on the left foot has harden to eschar in the middle.   Please give her a call on cell [phone] or call the office at [phone].  Clovis Pu, RN

## 2017-01-21 NOTE — Telephone Encounter (Addendum)
Spoke with Database administrator.   Continue unna boots of foot. Excessive urinary sediment that frequently clogs foley.  Sounds like she needs a suprapubic catheter.  Discussed with patient.  She is willing to investigate.

## 2017-01-21 NOTE — Telephone Encounter (Signed)
Called patient on unrelated matter.  Said she was willing to Skype or Facetime.

## 2017-01-21 NOTE — Addendum Note (Signed)
Addended by: Moses Manners on: 01/21/2017 05:22 PM   Modules accepted: Orders

## 2017-01-21 NOTE — Patient Outreach (Addendum)
[facility]) Care Management  01/21/2017  Barbara [name] [birth year] [id number]   Transition of care call   Spoke with patient reports she is doing "about the same', she discussed blood sugar is running in the 200 range, denies any episodes of hypoglycemia.  Patient some difficulty with foley over the weekend, reports foley clogged over the week, some improvement , then foley had to changed out again on Monday.  Discussed follow up plans regarding PCP visit, discussed option of skype/facetime, office visit. Patient verbally agreed to skype type virtual visit with MD if can be arranged. Discussed barriers to getting to PCP visit, related to transportation,and not preferring to use non emergency ambulance due to cost.   Plan Will follow up on planning visit,and communicate with Dr.Hensel office,  will follow up with Reece Levy regarding future transportation needs.  Patient has completed 60 day transition of care, will continue to follow patient closely , next call within a week.     Egbert Garibaldi, RN, [facility] Care Management,Care Management Coordinator  [phone]- Mobile [phone]- Toll Free Main Office

## 2017-01-21 NOTE — Assessment & Plan Note (Signed)
Urology consult.  Consider suprapubic cath.

## 2017-01-23 ENCOUNTER — Telehealth: Payer: Self-pay | Admitting: *Deleted

## 2017-01-23 NOTE — Telephone Encounter (Signed)
Dalbert Mayotte, RN with [facility] called to report 8 fluid filled blisters on backs of patient's legs. Smallest is dime size and largest is dollar coin size.  States the blisters are fragile and break open to light tough with clear drainage that leaves red raw area. Patient reported one felt itchy but no other complaints. No fevers or chills. Olegario Messier may be reached at [phone] with any questions or directions. Kinnie Feil, RN, BSN

## 2017-01-24 MED ORDER — FUROSEMIDE 20 MG PO TABS: 40.0000 mg | ORAL_TABLET | Freq: Two times a day (BID) | ORAL | 2 refills | Status: DC

## 2017-01-24 NOTE — Telephone Encounter (Signed)
Received fax from [facility] [place] stating patient's catheter clogged again last night requiring a 2nd change this week.  Catheter flushed yesterday in the AM but pt was soaked last night. There was thick grey sand like sediment in catheter an at tip. Urine was yellow in color with no odor. After flushing, urine was coming through catheter bu also around the cath. When removed, sediment was found. Requesting to try a larger catheter. Call to give verbal order to try larger catheter.   Also informed of increase in Lasix to 40 mg BID.    Please send in orders or call in orders regarding 8 fluid filled blisters on back of patient's legs.  Please see message from [facility] 01/23/17.    Please call [phone] with new orders or questions.  Clovis Pu, RN

## 2017-01-24 NOTE — Telephone Encounter (Signed)
Increase lasix to 40 mg bid.  Please ask [facility] to draw BMP in one week.  Also find out if I need to send a new lasix Rx

## 2017-01-24 NOTE — Telephone Encounter (Signed)
Increased lasix is the treatment of her leg blisters.

## 2017-01-28 ENCOUNTER — Other Ambulatory Visit: Payer: Self-pay | Admitting: *Deleted

## 2017-01-28 NOTE — Patient Outreach (Signed)
[facility]) Care Management  01/28/2017  Raniah Maron 21-Mar-1956 [id number]   1500 Follow up telephone call to patient  Placed call to patient , no answer able to leave a HIPAA compliant telephone message requesting a  return call .  Plan Will plan follow up call in the next day .   1535 Care Coordination call to [facility] home care regarding foley catheter care , reports patient has recently had foley catheter size changed from #16 to #18 on this week.  Patient continues with UNNA wrap boots in place.      Egbert Garibaldi, RN, [facility] Care Management,Care Management Coordinator  [phone]- Mobile [phone]- Toll Free Main Office

## 2017-01-28 NOTE — Telephone Encounter (Signed)
Gave VO to Amy at [facility] for BMP to be drawn on 9/28 (one week from increasing lasix to 40 mg BID) and to let Lynden Ang know that increasing lasix is the treatment for the leg blisters. Kinnie Feil, RN, BSN

## 2017-01-29 ENCOUNTER — Other Ambulatory Visit: Payer: Self-pay | Admitting: *Deleted

## 2017-01-29 DIAGNOSIS — M4802 Spinal stenosis, cervical region: Secondary | ICD-10-CM | POA: Diagnosis not present

## 2017-01-29 DIAGNOSIS — R531 Weakness: Secondary | ICD-10-CM | POA: Diagnosis not present

## 2017-01-29 DIAGNOSIS — G35 Multiple sclerosis: Secondary | ICD-10-CM | POA: Diagnosis not present

## 2017-01-29 DIAGNOSIS — E119 Type 2 diabetes mellitus without complications: Secondary | ICD-10-CM | POA: Diagnosis not present

## 2017-01-29 NOTE — Patient Outreach (Signed)
[facility]) Care Management  01/29/2017  Barbara [name] January 10, 1956 [id number]   CSW contacted patient to discuss possible transportation options for her to get to MD appointments as well as just out and about- shopping, etc.  She declines the need or interest in going to her PCP's office ; stating, "I don't think he would do anything different.....but I may want to go to the Urologist sometime". Patient agrees to let CSW know when/if she needs a ride for medical appointment as well as to think about an outing; to the mall, a Engineering geologist, store, etc just for an outing.   She denies any needs or concerns- will update RNCM and plan f/u call next week.      Reece Levy, MSW, LCSW Clinical Social Worker  [facility] [employer] [phone]

## 2017-01-29 NOTE — Patient Outreach (Signed)
[facility]) Care Management  01/29/2017  Barbara [name] 20-Aug-1955 [id number]  Telephone follow up call   Returned call to patient from yesterday missed call. Patient reports doing alright.  Discussed with patient arranging skype visit with Dr.Hensel in the next week, she states " If it has to be done" , then agreed to follow up call on next week to arrange visit, coordinate with PCP office, as he is agreeable to skype visit.   Patient slow to respond to some questions , asked about her feelings on today, admits to being a little down today, declined to discuss any further .   Plan Will plan follow up call to patient in the next week.   Egbert Garibaldi, RN, [facility] Care Management,Care Management Coordinator  [phone]- Mobile [phone]- Toll Free Main Office

## 2017-01-30 DIAGNOSIS — N319 Neuromuscular dysfunction of bladder, unspecified: Secondary | ICD-10-CM | POA: Diagnosis not present

## 2017-01-31 ENCOUNTER — Other Ambulatory Visit: Payer: Self-pay | Admitting: Family Medicine

## 2017-01-31 MED FILL — NITROFURANTOIN MCR 50 MG CA: 50 | 30 days supply | Qty: 60 | Fill #3

## 2017-01-31 MED FILL — diazePAM 5 MG TABS: 5 | 22 days supply | Qty: 90 | Fill #1

## 2017-01-31 MED FILL — NOVOLOG FLEXPEN SYRINGE: 100 | 90 days supply | Qty: 15 | Fill #1

## 2017-01-31 MED FILL — GABAPENTIN 300 MG CAPSULE: 300 | 30 days supply | Qty: 90 | Fill #0

## 2017-01-31 MED FILL — FUROSEMIDE 20 MG TABLET: 20 | 90 days supply | Qty: 180 | Fill #0

## 2017-01-31 NOTE — Telephone Encounter (Signed)
Please clarify if patient should continue doubling lasix. Kinnie Feil, RN, BSN

## 2017-01-31 NOTE — Telephone Encounter (Signed)
Received fax from [facility] that current 13F foley is frequently obstructed with sediment and sometimes blood.  Based on email exchange between myself and Dr. Isabel Caprice, I asked [facility] to replace current foley catheter with large bore 13F foley.  Also tried to facetime [name] without success  Couple of options  SP is not necessarily better as that catheter can get occluded as well. Generally SP tubes are larger caliber (20-13F) vs foleys that are often 16-64F and that is why they generally occlude less often. With that said the first step is to be sure she has a larger foley (20-13F) and that it is changed by home health every 3-4 weeks. Home health could also irrigate the catheter if going to the house for other reasons? Or family (if available) could also be thought to do the irrigations. This can be done with 50-100cc of NS or better yet with 0.25% acetic acid solution  SP is also an option but it does require a trip to the OR with at least some IV sedation. She would need a OV at [facility], Onalee Hua or [place] office consult depending on where she lives unless she has already seen one of the urologists?   I am not doing any surgery at this point and will be leaving clinical practice at the end of this year. I can try to facilitate something with one of my partners if needed.   Theodoro Grist    FromTivis Ringer  Sent: Friday, January 31, 2017 9:30 AM To: Barron [email or web address] [email or web address]> Subject: Suprapubic catheter question  Theodoro Grist, I have a question on a very challenging [facility] patient, Barbara [name] MRN [id number].  She should be in a SNF but has politely refused and opted to stay home.  She is bedbound with multiple sclerosis  (+++ other problems) and has a chronic indwelling foley.  The reason I write is the the foley quickly gets clogged with a sandy sediment and occasional blood.  I believe the bandaid fix is to switch the foley to a suprapubic catheter.  It is very  difficult for the patient to come to the office (and it is very time consuming for me to see her at home since she lives an hour away.)  I am open to any help/suggestions you have.  How do you think I should proceed? Annette Stable

## 2017-02-03 ENCOUNTER — Other Ambulatory Visit: Payer: Self-pay | Admitting: *Deleted

## 2017-02-03 NOTE — Patient Outreach (Signed)
[facility]) Care Management  02/03/2017  Barbara [name] [birth year] [id number]   Telephone follow up call  Placed call to patient reports she is feeling fine.  Discussed with patient her recent foley catheter change to larger size #18, patient reports she still has problem with catheter getting clogging up  and leaking  and it had to been changed again over weekend. Patient discussed she is not sure what is next or what the problem is.   Patient is agreeable to MD visit by Skype , in the next week, she again declines going in to office for visit or assistance with arranging transportation to visit.  Patient reports her blood sugars are running in the 200 range, denies episodes of hypoglycemia.   Patient denies any new concerns at this time  Plan Will send in basket message to [name] RN regarding scheduling skype visit with Dr.Hensel . Will follow up with patient regarding date time of visit.   Egbert Garibaldi, RN, [facility] Care Management,Care Management Coordinator  [phone]- Mobile [phone]- Toll Free Main Office

## 2017-02-05 ENCOUNTER — Other Ambulatory Visit: Payer: Self-pay | Admitting: *Deleted

## 2017-02-05 NOTE — Patient Outreach (Signed)
[facility]) Care Management  02/05/2017  Barbara [name] [birth year] [id number]   Telephone follow up call to patient to discuss schedule for Skype visit with Dr.Hensel.  Patient is agreeable to home visit with RN , to have Skype visit with PCP.    Plan Will plan home visit on 9/9 at 2 pm, will send Dr.Hensel in basket message.    Egbert Garibaldi, RN, [facility] Care Management,Care Management Coordinator  [phone]- Mobile [phone]- Toll Free Main Office

## 2017-02-06 ENCOUNTER — Telehealth: Payer: Self-pay | Admitting: *Deleted

## 2017-02-06 ENCOUNTER — Inpatient Hospital Stay (HOSPITAL_COMMUNITY)
Admission: EM | Admit: 2017-02-06 | Discharge: 2017-02-08 | DRG: 699 | Disposition: A | Discharge status: Home Health | Payer: PPO | Attending: Family Medicine | Admitting: Family Medicine

## 2017-02-06 ENCOUNTER — Inpatient Hospital Stay (HOSPITAL_COMMUNITY): Payer: PPO

## 2017-02-06 ENCOUNTER — Encounter (HOSPITAL_COMMUNITY): Payer: Self-pay

## 2017-02-06 ENCOUNTER — Emergency Department (HOSPITAL_COMMUNITY): Payer: PPO

## 2017-02-06 DIAGNOSIS — S62601A Fracture of unspecified phalanx of left index finger, initial encounter for closed fracture: Secondary | ICD-10-CM | POA: Diagnosis present

## 2017-02-06 DIAGNOSIS — N390 Urinary tract infection, site not specified: Secondary | ICD-10-CM | POA: Diagnosis not present

## 2017-02-06 DIAGNOSIS — Z888 Allergy status to other drugs, medicaments and biological substances status: Secondary | ICD-10-CM

## 2017-02-06 DIAGNOSIS — I878 Other specified disorders of veins: Secondary | ICD-10-CM | POA: Diagnosis not present

## 2017-02-06 DIAGNOSIS — M1A9XX0 Chronic gout, unspecified, without tophus (tophi): Secondary | ICD-10-CM | POA: Diagnosis not present

## 2017-02-06 DIAGNOSIS — E118 Type 2 diabetes mellitus with unspecified complications: Secondary | ICD-10-CM | POA: Insufficient documentation

## 2017-02-06 DIAGNOSIS — Z66 Do not resuscitate: Secondary | ICD-10-CM | POA: Diagnosis present

## 2017-02-06 DIAGNOSIS — Z9981 Dependence on supplemental oxygen: Secondary | ICD-10-CM

## 2017-02-06 DIAGNOSIS — Z7952 Long term (current) use of systemic steroids: Secondary | ICD-10-CM

## 2017-02-06 DIAGNOSIS — M62838 Other muscle spasm: Secondary | ICD-10-CM | POA: Diagnosis not present

## 2017-02-06 DIAGNOSIS — I5040 Unspecified combined systolic (congestive) and diastolic (congestive) heart failure: Secondary | ICD-10-CM | POA: Diagnosis not present

## 2017-02-06 DIAGNOSIS — Z7982 Long term (current) use of aspirin: Secondary | ICD-10-CM

## 2017-02-06 DIAGNOSIS — Z23 Encounter for immunization: Secondary | ICD-10-CM

## 2017-02-06 DIAGNOSIS — G8220 Paraplegia, unspecified: Secondary | ICD-10-CM | POA: Diagnosis not present

## 2017-02-06 DIAGNOSIS — I110 Hypertensive heart disease with heart failure: Secondary | ICD-10-CM | POA: Diagnosis present

## 2017-02-06 DIAGNOSIS — I5032 Chronic diastolic (congestive) heart failure: Secondary | ICD-10-CM | POA: Diagnosis present

## 2017-02-06 DIAGNOSIS — N319 Neuromuscular dysfunction of bladder, unspecified: Secondary | ICD-10-CM | POA: Diagnosis present

## 2017-02-06 DIAGNOSIS — N3001 Acute cystitis with hematuria: Secondary | ICD-10-CM | POA: Diagnosis not present

## 2017-02-06 DIAGNOSIS — J810 Acute pulmonary edema: Secondary | ICD-10-CM

## 2017-02-06 DIAGNOSIS — X501XXA Overexertion from prolonged static or awkward postures, initial encounter: Secondary | ICD-10-CM | POA: Diagnosis not present

## 2017-02-06 DIAGNOSIS — I872 Venous insufficiency (chronic) (peripheral): Secondary | ICD-10-CM | POA: Diagnosis not present

## 2017-02-06 DIAGNOSIS — R0602 Shortness of breath: Secondary | ICD-10-CM | POA: Diagnosis not present

## 2017-02-06 DIAGNOSIS — E876 Hypokalemia: Secondary | ICD-10-CM | POA: Diagnosis present

## 2017-02-06 DIAGNOSIS — Z79891 Long term (current) use of opiate analgesic: Secondary | ICD-10-CM

## 2017-02-06 DIAGNOSIS — G35 Multiple sclerosis: Secondary | ICD-10-CM | POA: Diagnosis not present

## 2017-02-06 DIAGNOSIS — Z6841 Body Mass Index (BMI) 40.0 and over, adult: Secondary | ICD-10-CM | POA: Diagnosis not present

## 2017-02-06 DIAGNOSIS — T83511A Infection and inflammatory reaction due to indwelling urethral catheter, initial encounter: Secondary | ICD-10-CM | POA: Diagnosis not present

## 2017-02-06 DIAGNOSIS — I959 Hypotension, unspecified: Secondary | ICD-10-CM | POA: Diagnosis not present

## 2017-02-06 DIAGNOSIS — Y846 Urinary catheterization as the cause of abnormal reaction of the patient, or of later complication, without mention of misadventure at the time of the procedure: Secondary | ICD-10-CM | POA: Diagnosis not present

## 2017-02-06 DIAGNOSIS — I509 Heart failure, unspecified: Secondary | ICD-10-CM

## 2017-02-06 DIAGNOSIS — Z79899 Other long term (current) drug therapy: Secondary | ICD-10-CM

## 2017-02-06 DIAGNOSIS — R059 Cough, unspecified: Secondary | ICD-10-CM

## 2017-02-06 DIAGNOSIS — Z794 Long term (current) use of insulin: Secondary | ICD-10-CM

## 2017-02-06 DIAGNOSIS — Z8249 Family history of ischemic heart disease and other diseases of the circulatory system: Secondary | ICD-10-CM

## 2017-02-06 DIAGNOSIS — I517 Cardiomegaly: Secondary | ICD-10-CM | POA: Diagnosis not present

## 2017-02-06 DIAGNOSIS — S6992XA Unspecified injury of left wrist, hand and finger(s), initial encounter: Secondary | ICD-10-CM | POA: Diagnosis not present

## 2017-02-06 DIAGNOSIS — E119 Type 2 diabetes mellitus without complications: Secondary | ICD-10-CM | POA: Diagnosis present

## 2017-02-06 DIAGNOSIS — R531 Weakness: Secondary | ICD-10-CM | POA: Diagnosis not present

## 2017-02-06 DIAGNOSIS — H5461 Unqualified visual loss, right eye, normal vision left eye: Secondary | ICD-10-CM | POA: Diagnosis present

## 2017-02-06 DIAGNOSIS — Z833 Family history of diabetes mellitus: Secondary | ICD-10-CM

## 2017-02-06 DIAGNOSIS — Z881 Allergy status to other antibiotic agents status: Secondary | ICD-10-CM

## 2017-02-06 DIAGNOSIS — R069 Unspecified abnormalities of breathing: Secondary | ICD-10-CM | POA: Diagnosis not present

## 2017-02-06 DIAGNOSIS — G8929 Other chronic pain: Secondary | ICD-10-CM | POA: Diagnosis not present

## 2017-02-06 DIAGNOSIS — Z981 Arthrodesis status: Secondary | ICD-10-CM

## 2017-02-06 DIAGNOSIS — R05 Cough: Secondary | ICD-10-CM | POA: Diagnosis not present

## 2017-02-06 DIAGNOSIS — J80 Acute respiratory distress syndrome: Secondary | ICD-10-CM | POA: Diagnosis not present

## 2017-02-06 HISTORY — DX: Heart failure, unspecified: I50.9

## 2017-02-06 LAB — URINALYSIS, ROUTINE W REFLEX MICROSCOPIC
BILIRUBIN URINE: NEGATIVE
Glucose, UA: NEGATIVE mg/dL
Ketones, ur: 5 mg/dL — AB
NITRITE: POSITIVE — AB
Protein, ur: 100 mg/dL — AB
SPECIFIC GRAVITY, URINE: 1.014 (ref 1.005–1.030)
SQUAMOUS EPITHELIAL / LPF: NONE SEEN
pH: 7 (ref 5.0–8.0)

## 2017-02-06 LAB — CBC WITH DIFFERENTIAL/PLATELET
BASOS ABS: 0 10*3/uL (ref 0.0–0.1)
BASOS PCT: 0 %
EOS ABS: 0.4 10*3/uL (ref 0.0–0.7)
Eosinophils Relative: 3 %
HCT: 34.8 % — ABNORMAL LOW (ref 36.0–46.0)
HEMOGLOBIN: 10.3 g/dL — AB (ref 12.0–15.0)
Lymphocytes Relative: 13 %
Lymphs Abs: 1.4 10*3/uL (ref 0.7–4.0)
MCH: 26.8 pg (ref 26.0–34.0)
MCHC: 29.6 g/dL — AB (ref 30.0–36.0)
MCV: 90.6 fL (ref 78.0–100.0)
Monocytes Absolute: 0.7 10*3/uL (ref 0.1–1.0)
Monocytes Relative: 6 %
NEUTROS PCT: 78 %
Neutro Abs: 8.3 10*3/uL — ABNORMAL HIGH (ref 1.7–7.7)
Platelets: 275 10*3/uL (ref 150–400)
RBC: 3.84 MIL/uL — AB (ref 3.87–5.11)
RDW: 21 % — ABNORMAL HIGH (ref 11.5–15.5)
WBC: 10.6 10*3/uL — AB (ref 4.0–10.5)

## 2017-02-06 LAB — COMPREHENSIVE METABOLIC PANEL
ALK PHOS: 90 U/L (ref 38–126)
ALT: 22 U/L (ref 14–54)
ANION GAP: 12 (ref 5–15)
AST: 49 U/L — ABNORMAL HIGH (ref 15–41)
Albumin: 2.5 g/dL — ABNORMAL LOW (ref 3.5–5.0)
BILIRUBIN TOTAL: 0.8 mg/dL (ref 0.3–1.2)
BUN: 6 mg/dL (ref 6–20)
CALCIUM: 8.8 mg/dL — AB (ref 8.9–10.3)
CO2: 40 mmol/L — ABNORMAL HIGH (ref 22–32)
Chloride: 87 mmol/L — ABNORMAL LOW (ref 101–111)
Creatinine, Ser: 0.78 mg/dL (ref 0.44–1.00)
GFR calc non Af Amer: >60 mL/min (ref >60)
Glucose, Bld: 255 mg/dL — ABNORMAL HIGH (ref 65–99)
POTASSIUM: 3.1 mmol/L — AB (ref 3.5–5.1)
SODIUM: 139 mmol/L (ref 135–145)
TOTAL PROTEIN: 6.3 g/dL — AB (ref 6.5–8.1)

## 2017-02-06 LAB — PROTIME-INR
INR: 1.08
PROTHROMBIN TIME: 13.9 s (ref 11.4–15.2)

## 2017-02-06 LAB — D-DIMER, QUANTITATIVE: D-Dimer, Quant: 2.71 ug/mL-FEU — ABNORMAL HIGH (ref 0.00–0.50)

## 2017-02-06 LAB — BRAIN NATRIURETIC PEPTIDE: B NATRIURETIC PEPTIDE 5: 50.5 pg/mL (ref 0.0–100.0)

## 2017-02-06 LAB — I-STAT CG4 LACTIC ACID, ED
LACTIC ACID, VENOUS: 1.62 mmol/L (ref 0.5–1.9)
Lactic Acid, Venous: 1.73 mmol/L (ref 0.5–1.9)

## 2017-02-06 MED ORDER — DIAZEPAM 2 MG PO TABS
2.0000 mg | ORAL_TABLET | Freq: Four times a day (QID) | ORAL | Status: DC | PRN
  Administered 2017-02-07: 2 mg via ORAL
  Filled 2017-02-06: qty 1

## 2017-02-06 MED ORDER — GABAPENTIN 300 MG PO CAPS
300.0000 mg | ORAL_CAPSULE | Freq: Two times a day (BID) | ORAL | Status: DC | PRN
  Administered 2017-02-07: 300 mg via ORAL
  Filled 2017-02-06: qty 1

## 2017-02-06 MED ORDER — PIPERACILLIN-TAZOBACTAM 3.375 G IVPB 30 MIN
3.3750 g | Freq: Once | INTRAVENOUS | Status: AC
  Administered 2017-02-06: 3.375 g via INTRAVENOUS
  Filled 2017-02-06: qty 50

## 2017-02-06 MED ORDER — POTASSIUM CHLORIDE CRYS ER 20 MEQ PO TBCR
40.0000 meq | EXTENDED_RELEASE_TABLET | Freq: Once | ORAL | Status: AC
  Administered 2017-02-06: 40 meq via ORAL
  Filled 2017-02-06: qty 2

## 2017-02-06 MED ORDER — IOPAMIDOL (ISOVUE-370) INJECTION 76%
INTRAVENOUS | Status: AC
  Administered 2017-02-06: 100 mL
  Filled 2017-02-06: qty 100

## 2017-02-06 MED ORDER — FUROSEMIDE 10 MG/ML IJ SOLN
40.0000 mg | INTRAMUSCULAR | Status: AC
  Administered 2017-02-06: 40 mg via INTRAVENOUS
  Filled 2017-02-06: qty 4

## 2017-02-06 MED ORDER — PIPERACILLIN-TAZOBACTAM 3.375 G IVPB
3.3750 g | Freq: Three times a day (TID) | INTRAVENOUS | Status: DC
  Administered 2017-02-07 – 2017-02-08 (×5): 3.375 g via INTRAVENOUS
  Filled 2017-02-06 (×6): qty 50

## 2017-02-06 MED ORDER — METHOCARBAMOL 500 MG PO TABS
500.0000 mg | ORAL_TABLET | Freq: Once | ORAL | Status: AC
  Administered 2017-02-06: 500 mg via ORAL
  Filled 2017-02-06: qty 1

## 2017-02-06 MED ORDER — TRAMADOL HCL 50 MG PO TABS
50.0000 mg | ORAL_TABLET | Freq: Once | ORAL | Status: AC
  Administered 2017-02-06: 50 mg via ORAL
  Filled 2017-02-06: qty 1

## 2017-02-06 MED ORDER — GABAPENTIN 300 MG PO CAPS
300.0000 mg | ORAL_CAPSULE | Freq: Once | ORAL | Status: AC
  Administered 2017-02-06: 300 mg via ORAL
  Filled 2017-02-06: qty 1

## 2017-02-06 NOTE — ED Notes (Signed)
Patient transported to CT 

## 2017-02-06 NOTE — ED Provider Notes (Signed)
[facility]-EMERGENCY DEPT Provider Note   CSN: [id number] Arrival date & time: 02/06/17  1623     History   Chief Complaint Chief Complaint  Patient presents with  . Weakness    HPI Barbara [name] is a 61 y.o. female.  61 year old female history of MS, chronic indwelling Foley due to bladder incontinence, gout, diabetes, HTN, morbid obesity, chronic back pain, recurrent admissions for UTI sepsis who presents with cough, generalized fatigue, and shortness of breath.  Patient describes 1 week of shortness of breath.  She had onset of nonproductive cough earlier today.  Has had chronic indwelling Foley since February.  Foley was noted to be obstructed several days ago.  Her home health nurse exchange her Foley today.  Patient and sister deny any fevers.  She has noted generalized malaise for the past several days.  Has had worsening of her chronic back pain.  Has had previous admissions over past several months for sepsis 2/2 UTI.  Previously grown Proteus in urine culture.  The history is provided by the patient, a relative and medical records. No language interpreter was used.    Past Medical History:  Diagnosis Date  . Absence of bladder continence   . 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 Cellulitis, leg 09/24/2016  . Cervical myelopathy (HCC) 08/07/2011  . CHF (congestive heart failure) (HCC) 02/06/2017  . Chronic gout 12/07/2010   Infrequent flairs   . Chronic indwelling Foley catheter 11/29/2016  . Chronic venous insufficiency 11/05/2012  . Current chronic use of systemic steroids 03/07/2016   No shingles vaccine  . Decubitus ulcer of sacral region   . Diabetes mellitus without complication (HCC)   . DM type 2, not at goal [facility]) 03/06/2016  . Essential hypertension   . Gout   . Hypertension   . Hypoxia   . Impaired mobility and activities of daily living 03/07/2016  . Lower back pain   . Lower extremity weakness 04/02/2016  . Lumbar spondylosis   . Morbid obesity (HCC) 12/07/2010   Has always  been large.  Since MS has been trying and partially successful at wt loss.     . MS (multiple sclerosis) (HCC)   . Multiple sclerosis (HCC) 12/07/2010   Diagnosed 05/2010.  Managed by Dr. Sandria Manly   . Numbness   . Shortness of breath   . Spastic paraplegia   . Spinal stenosis   . Spinal stenosis in cervical region 06/03/2011    [facility] (Dr. Timoteo Ace) anterior discectomies and fusion at C5-6,6-7 & 7-1 done on 06/14/11.    Marland Kitchen Spinal stenosis of lumbar region 12/07/2010   Chronic low back pain.  Left leg weak but unclear if due to MS or spinal stenosis.  Pain well controled by tramadol.    Now (Jan 2013)  Managed  [facility], who plans at a later date lumbar laminectomy L2-4.   Marland Kitchen Weakness   . Weakness of both lower extremities     Patient Active Problem List   Diagnosis Date Noted  . CHF (congestive heart failure) (HCC) 02/06/2017  . Acute pulmonary edema (HCC)   . Type 2 diabetes mellitus without complication, with long-term current use of insulin (HCC)   . Chronic diastolic heart failure (HCC) 12/16/2016  . Pressure injury of skin 12/14/2016  . Neurogenic bladder 12/10/2016  . Acute on chronic respiratory failure (HCC) 12/09/2016  . Sepsis (HCC)   . Somnolence   . Mechanical complication due to bladder catheter (HCC) 12/08/2016  . Hematuria 12/08/2016  .  Bladder catheter infection (HCC) 12/08/2016  . Pneumonia due to organism 12/08/2016  . Adverse reaction to antibiotic - Vancomycin 12/08/2016  . Hypoxia 12/07/2016  . UTI (urinary tract infection) 12/07/2016  . Chronic indwelling Foley catheter 11/29/2016  . Cellulitis, leg 09/24/2016  . Shortness of breath   . Lower extremity weakness 04/02/2016  . Palliative care encounter 03/07/2016  . Current chronic use of systemic steroids 03/07/2016  . DM type 2, not at goal [facility]) 03/06/2016  . Weakness of both lower extremities   . Absence of bladder continence   . Essential hypertension   . Encounter for chronic pain management 04/21/2014  .  Chronic venous insufficiency 11/05/2012  . Cervical myelopathy (HCC) 08/07/2011  . Spinal stenosis in cervical region 06/03/2011  . Multiple sclerosis (HCC) 12/07/2010  . 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 Spinal stenosis of lumbar region 12/07/2010  . Morbid obesity (HCC) 12/07/2010  . Chronic gout 12/07/2010    Past Surgical History:  Procedure Laterality Date  . BACK SURGERY    . CERVICAL DISC SURGERY     Qusetions C5-6 C6-7 and C7-T1    OB History    No data available       Home Medications    Prior to Admission medications   Medication Sig Start Date End Date Taking? Authorizing Provider  Apple Cider Vinegar 188 MG CAPS Take 188 mg by mouth 2 (two) times daily.  11/29/16  Yes McDiarmid, Leighton Roach, MD  aspirin EC 325 MG tablet Take 325 mg by mouth every evening.    Yes [provider]  Cholecalciferol (VITAMIN D3) 1000 units CAPS Take 4,000 Units by mouth every evening.    Yes [provider]  Coenzyme Q10 (COQ10) 100 MG CAPS Take 100 mg by mouth every evening.   Yes [provider]  diazepam (VALIUM) 5 MG tablet TAKE 1 TABLET BY MOUTH EVERY 6 HOURS AS NEEDED FOR LEG SPASMS 12/25/16  Yes Hensel, Santiago Bumpers, MD  furosemide (LASIX) 20 MG tablet TAKE 1 TABLET BY MOUTH 2 TIMES DAILY. 01/31/17  Yes Hensel, Santiago Bumpers, MD  gabapentin (NEURONTIN) 300 MG capsule Take 1 capsule (300 mg total) by mouth 3 (three) times daily. 12/16/16  Yes Bland, Scott, DO  ibuprofen (ADVIL,MOTRIN) 400 MG tablet Take 1 tablet (400 mg total) by mouth every 6 (six) hours as needed for headache or moderate pain. 04/05/16  Yes Riccio, Marylene Land C, DO  insulin aspart (NOVOLOG) 100 UNIT/ML FlexPen Inject 0-5 Units into the skin every morning.  11/29/16  Yes McDiarmid, Leighton Roach, MD  Insulin Glargine (LANTUS) 100 UNIT/ML Solostar Pen Inject 10 Units into the skin daily at 10 pm. Patient taking differently: Inject 13 Units into the skin at bedtime.  09/16/16  Yes Hensel, Santiago Bumpers, MD  Magnesium 250 MG  TABS Take 250 mg by mouth every evening.    Yes [provider]  Multiple Vitamin (MULTIVITAMIN WITH MINERALS) TABS tablet Take 1 tablet by mouth every evening.   Yes [provider]  nitrofurantoin (MACRODANTIN) 50 MG capsule Take 1 capsule (50 mg total) by mouth 2 (two) times daily. 10/16/16  Yes Hensel, Santiago Bumpers, MD  predniSONE (DELTASONE) 5 MG tablet Take 5 mg by mouth daily with breakfast.   Yes [provider]  traMADol (ULTRAM) 50 MG tablet Take 1 tablet (50 mg total) by mouth every 8 (eight) hours as needed. 12/16/16  Yes Bland, Scott, DO  levofloxacin (LEVAQUIN) 500 MG tablet Take 1 tablet (500 mg total)  by mouth daily. Patient not taking: Reported on 02/06/2017 12/17/16   Marthenia Rolling, DO  methocarbamol (ROBAXIN) 500 MG tablet Take 0.5 tablets (250 mg total) by mouth 3 (three) times daily as needed for muscle spasms. Patient not taking: Reported on 02/06/2017 12/16/16   Marthenia Rolling, DO  predniSONE (DELTASONE) 5 MG tablet Take 3 tablets (15 mg total) by mouth daily with breakfast. Patient not taking: Reported on 02/06/2017 12/17/16   Marthenia Rolling, DO    Family History Family History  Problem Relation Age of Onset  . Heart disease Mother   . Diabetes Mother   . Cancer Father     Social History Social History  Substance Use Topics  . Smoking status: Never Smoker  . Smokeless tobacco: Never Used  . Alcohol use No     Allergies   Metformin and related and Vancomycin   Review of Systems Review of Systems  Constitutional: Positive for fatigue. Negative for chills and fever.  HENT: Negative for ear pain and sore throat.   Eyes: Negative for pain and visual disturbance.  Respiratory: Positive for cough and shortness of breath.   Cardiovascular: Negative for chest pain and palpitations.  Gastrointestinal: Negative for abdominal pain and vomiting.  Genitourinary: Negative for dysuria and hematuria.       Positive for clogged urinary foley    Musculoskeletal: Negative for arthralgias and back pain.  Skin: Negative for color change and rash.  Neurological: Positive for weakness. Negative for seizures and syncope.  All other systems reviewed and are negative.    Physical Exam Updated Vital Signs BP (!) 95/52 (BP Location: Right Arm)   Pulse 88   Temp 98.4 F (36.9 C) (Oral)   Resp 18   Ht 5\' 3"  (1.6 m)   Wt (!) 147.4 kg (325 lb)   LMP 10/07/2010   SpO2 96%   BMI 57.57 kg/m   Physical Exam  Constitutional: She appears well-developed.  Obese female. Uncomfortable due to chronic back pain  HENT:  Head: Normocephalic and atraumatic.  Eyes: Conjunctivae are normal.  Neck: Neck supple.  Cardiovascular: Normal rate and regular rhythm.   No murmur heard. Pulmonary/Chest: Effort normal and breath sounds normal. No respiratory distress.  Abdominal: Soft. There is no tenderness.  Genitourinary:  Genitourinary Comments: Indwelling foley draining clear brown urine  Musculoskeletal: She exhibits no edema.  BLE with dressings for chronic venous stasis  Neurological: She is alert. No cranial nerve deficit. Coordination normal.  5/5 motor strength and intact sensation in all extremities. Finger-to-nose intact bilaterally  Skin: Skin is warm and dry.  Nursing note and vitals reviewed.    ED Treatments / Results  Labs (all labs ordered are listed, but only abnormal results are displayed) Labs Reviewed  COMPREHENSIVE METABOLIC PANEL - Abnormal; Notable for the following:       Result Value   Potassium 3.1 (*)    Chloride 87 (*)    CO2 40 (*)    Glucose, Bld 255 (*)    Calcium 8.8 (*)    Total Protein 6.3 (*)    Albumin 2.5 (*)    AST 49 (*)    All other components within normal limits  CBC WITH DIFFERENTIAL/PLATELET - Abnormal; Notable for the following:    WBC 10.6 (*)    RBC 3.84 (*)    Hemoglobin 10.3 (*)    HCT 34.8 (*)    MCHC 29.6 (*)    RDW 21.0 (*)    Neutro Abs 8.3 (*)  All other components within  normal limits  URINALYSIS, ROUTINE W REFLEX MICROSCOPIC - Abnormal; Notable for the following:    APPearance HAZY (*)    Hgb urine dipstick LARGE (*)    Ketones, ur 5 (*)    Protein, ur 100 (*)    Nitrite POSITIVE (*)    Leukocytes, UA LARGE (*)    Bacteria, UA FEW (*)    All other components within normal limits  D-DIMER, QUANTITATIVE (NOT AT [facility]) - Abnormal; Notable for the following:    D-Dimer, Quant 2.71 (*)    All other components within normal limits  CBC - Abnormal; Notable for the following:    WBC 10.7 (*)    RBC 3.72 (*)    Hemoglobin 10.0 (*)    HCT 33.4 (*)    MCHC 29.9 (*)    RDW 21.0 (*)    All other components within normal limits  BASIC METABOLIC PANEL - Abnormal; Notable for the following:    Sodium 134 (*)    Potassium 3.2 (*)    Chloride 87 (*)    CO2 38 (*)    Glucose, Bld 237 (*)    BUN <5 (*)    Calcium 8.3 (*)    All other components within normal limits  GLUCOSE, CAPILLARY - Abnormal; Notable for the following:    Glucose-Capillary 260 (*)    All other components within normal limits  GLUCOSE, CAPILLARY - Abnormal; Notable for the following:    Glucose-Capillary 250 (*)    All other components within normal limits  MRSA PCR SCREENING  URINE CULTURE  CULTURE, BLOOD (SINGLE)  PROTIME-INR  BRAIN NATRIURETIC PEPTIDE  I-STAT CG4 LACTIC ACID, ED  I-STAT CG4 LACTIC ACID, ED    EKG  EKG Interpretation  Date/Time:  Thursday February 06 2017 16:28:52 EDT Ventricular Rate:  98 PR Interval:    QRS Duration: 91 QT Interval:  335 QTC Calculation: 428 R Axis:   94 Text Interpretation:  Sinus rhythm Low voltage with right axis deviation Consider anterior infarct Nonspecific T abnormalities, lateral leads Baseline wander in lead(s) V6 similar to previous Confirmed by Frederick Peers [phone]) on 02/06/2017 4:33:36 PM       Radiology Dg Chest 2 View  Result Date: 02/06/2017 CLINICAL DATA:  Initial evaluation for acute weakness. EXAM: CHEST  2 VIEW  COMPARISON:  Prior radiograph from 12/11/2016. FINDINGS: Stable cardiomegaly.  Mediastinal silhouette is unchanged. Lungs hypoinflated. Diffuse vascular congestion with interstitial prominence, suggesting mild pulmonary interstitial edema. No Shakerra Red alveolar edema. No definite focal infiltrates. No definite pleural effusion. No pneumothorax. No acute osseous abnormality.  Cervical ACDF noted. IMPRESSION: Cardiomegaly with mild diffuse pulmonary interstitial edema/ congestion. Electronically Signed   By: Rise Alyx Mcguirk M.D.   On: 02/06/2017 17:20   Ct Angio Chest Pe W Or Wo Contrast  Result Date: 02/06/2017 CLINICAL DATA:  Shortness of breath with cough EXAM: CT ANGIOGRAPHY CHEST WITH CONTRAST TECHNIQUE: Multidetector CT imaging of the chest was performed using the standard protocol during bolus administration of intravenous contrast. Multiplanar CT image reconstructions and MIPs were obtained to evaluate the vascular anatomy. CONTRAST:  80 cc Isovue 370 intravenous COMPARISON:  Radiograph 02/06/2017, CT chest 12/07/2016 FINDINGS: Cardiovascular: Satisfactory opacification of the pulmonary arteries to the segmental level. Possible small nonocclusive filling defect within a right lower lobe segmental branch vessel. No other definitive filling defects are visualized. Ectatic ascending aorta measuring up to 3.7 cm. No dissection. Enlarged pulmonary artery trunk measuring up to 4.2 cm. Mild coronary  artery calcification. Mild cardiomegaly. No pericardial effusion Mediastinum/Nodes: No enlarged mediastinal, hilar, or axillary lymph nodes. Thyroid gland, trachea, and esophagus demonstrate no significant findings. Lungs/Pleura: Bilateral ground-glass densities. No large pleural effusion. More confluent ground-glass density in the right upper lobe. Negative for a pneumothorax. Upper Abdomen: Diffuse increased density in the gallbladder suspicious for sludge. Multiple stones in the gallbladder. Musculoskeletal:  Degenerative changes. No acute or suspicious bone lesion. Postsurgical changes of the lower cervical spine. Review of the MIP images confirms the above findings. IMPRESSION: 1. Study degraded by body habitus. Possible small filling defect within a segmental right lower lobe pulmonary artery branch vessel, cannot exclude a small embolus in this region. 2. Enlarged pulmonary artery trunk suspicious for pulmonary artery hypertension 3. Diffuse bilateral ground-glass density with cardiomegaly, suggestive of pulmonary edema. More focal airspace disease in the right upper lobe may reflect additional pneumonia. 4. Sludge filled gallbladder with multiple stones Electronically Signed   By: Jasmine Pang M.D.   On: 02/06/2017 23:40   Dg Finger Index Left  Result Date: 02/06/2017 CLINICAL DATA:  Twisted index finger a few weeks ago with re-injury again today. EXAM: LEFT INDEX FINGER 2+V COMPARISON:  None. FINDINGS: Soft tissue swelling over the second PIP joint. Subtle acute versus chronic chip fracture along the volar base of the second distal phalanx. Possible subtle fracture along the base of the second middle phalanx seen only on the lateral film. Mild degenerative change at the first and second MCP joints. IMPRESSION: Chip fracture along the volar base of the second distal phalanx which may be acute or chronic. Possible subtle fracture at the base of the second middle phalanx. Electronically Signed   By: Elberta Fortis M.D.   On: 02/06/2017 19:58    Procedures Procedures (including critical care time)  Medications Ordered in ED Medications  predniSONE (DELTASONE) tablet 5 mg (5 mg Oral Given 02/07/17 0856)  gabapentin (NEURONTIN) capsule 300 mg (300 mg Oral Given 02/07/17 0906)  traMADol (ULTRAM) tablet 50 mg (50 mg Oral Given 02/07/17 0501)  multivitamin with minerals tablet 1 tablet (not administered)  aspirin EC tablet 325 mg (not administered)  enoxaparin (LOVENOX) injection 40 mg (40 mg Subcutaneous  Given 02/07/17 0501)  sodium chloride flush (NS) 0.9 % injection 3 mL (3 mLs Intravenous Not Given 02/07/17 0145)  sodium chloride flush (NS) 0.9 % injection 3 mL (3 mLs Intravenous Given 02/07/17 0201)  sodium chloride flush (NS) 0.9 % injection 3 mL (not administered)  insulin glargine (LANTUS) injection 5 Units (5 Units Subcutaneous Given 02/07/17 0159)  insulin aspart (novoLOG) injection 0-9 Units (3 Units Subcutaneous Given 02/07/17 0906)  insulin aspart (novoLOG) injection 0-5 Units (3 Units Subcutaneous Given 02/07/17 0214)  piperacillin-tazobactam (ZOSYN) IVPB 3.375 g (3.375 g Intravenous New Bag/Given 02/07/17 0418)  Influenza vac split quadrivalent PF (FLUARIX) injection 0.5 mL (not administered)  methocarbamol (ROBAXIN) tablet 500 mg (not administered)  diazepam (VALIUM) tablet 5 mg (not administered)  piperacillin-tazobactam (ZOSYN) IVPB 3.375 g (0 g Intravenous Stopped 02/06/17 2034)  methocarbamol (ROBAXIN) tablet 500 mg (500 mg Oral Given 02/06/17 1942)  gabapentin (NEURONTIN) capsule 300 mg (300 mg Oral Given 02/06/17 2012)  traMADol (ULTRAM) tablet 50 mg (50 mg Oral Given 02/06/17 1942)  potassium chloride SA (K-DUR,KLOR-CON) CR tablet 40 mEq (40 mEq Oral Given 02/06/17 1943)  furosemide (LASIX) injection 40 mg (40 mg Intravenous Given 02/06/17 2012)  furosemide (LASIX) injection 40 mg (40 mg Intravenous Given 02/07/17 0426)  iopamidol (ISOVUE-370) 76 % injection (100 mLs  Contrast Given 02/06/17 2256)     Initial Impression / Assessment and Plan / ED Course  I have reviewed the triage vital signs and the nursing notes.  Pertinent labs & imaging results that were available during my care of the patient were reviewed by me and considered in my medical decision making (see chart for details).     61 year old female history of MS, chronic indwelling Foley due to bladder incontinence, gout, diabetes, HTN, morbid obesity, chronic back pain, and recurrent admissions for UTI sepsis who p/w  generalized malaise, fatigue, and cough. AF, VSS. No focal neuro deficits. Foley exchanged earlier today by home health nurse. Urinary foley draining clear brown urine.  CXR showing cardiomegaly with mild diffuse pulmonary interstitial edema/congestion. EKGS showing NSR. WBC 10.6. UA showing evidence of UTI including positive nitrites, large leukos, 6-30 WBCs, and few bacteria.  Zosyn and lasix given. Pt and sister requesting admission for continued monitoring due to previous UTI sepsis. Family medicine to admit pt.  Pt care d/w Dr. Clarene Duke  Final Clinical Impressions(s) / ED Diagnoses   Final diagnoses:  Urinary tract infection associated with indwelling urethral catheter, initial encounter [facility])  Generalized weakness  Cough    New Prescriptions Current Discharge Medication List       Hebert Soho, MD 02/07/17 1214    Little, Ambrose Finland, MD 02/13/17 0007

## 2017-02-06 NOTE — Telephone Encounter (Signed)
Olegario Messier, RN with [facility] left message on nurse line stating patient was being transferred to ED by EMS. Olegario Messier, reported that patient looked visibly sick. Pt complained of back, leg pain that is worse than normal and chest congestion. Patient's catheter was also clogged again. Please give her a call with questions at [phone]. Clovis Pu, RN

## 2017-02-06 NOTE — Progress Notes (Addendum)
Pharmacy Antibiotic Note  Barbara [name] is a 61 y.o. female admitted on 02/06/2017 with UTI.  Pharmacy has been consulted for zosyn dosing. Afeb, wbc 10.6, SCr 0.78 on admit.  Plan: Zosyn 3.375g IV ( inf) x1; then 3.375g IV q8h (4h inf) Monitor clinical progress, c/s, renal function F/u de-escalation plan/LOT     Temp (24hrs), Avg:98.4 F (36.9 C), Min:98.4 F (36.9 C), Max:98.4 F (36.9 C)   Recent Labs Lab 02/06/17 1644 02/06/17 1652 02/06/17 2130  WBC 10.6*  --   --   CREATININE 0.78  --   --   LATICACIDVEN  --  1.73 1.62    CrCl cannot be calculated (Unknown ideal weight.).    Allergies  Allergen Reactions  . Metformin And Related Diarrhea and Nausea And Vomiting  . Vancomycin Rash    Significant "red man" like reaction on entire body that occurred several hours after Vancomycin infusion    Babs Bertin, PharmD, BCPS Clinical Pharmacist 02/06/2017 10:02 PM

## 2017-02-06 NOTE — H&P (Signed)
Family Medicine Teaching [facility] Admission History and Physical Service Pager: [phone]  Patient name: Barbara [name] Medical record number: [id number] Date of birth: 04/16/1956 Age: 61 y.o. Gender: female  Primary Care Provider: Moses Manners, MD Consultants: None Code Status: DNR  Chief Complaint: weakness  Assessment and Plan: Britanie Harshman is a 61 y.o. female presenting with respiratory changes, generalized weakness, new generalized back pain, clogged chronic foley with clots. PMH is significant for diastolic CHF w/ O2 dependancy 2L, MS, chronic foley, muscle spasms, morbid obesity, HTN, T2DM  Shortness of breath: Currently satting low 90s on her home 2L O2. CXR showed pulmonary edema, some new wheezing on end expiration although body habitus makes ausculation exam difficult, endorses rhinorhea but denies sore throat.  Claims a nonproductive cough for a few weeks. CHF hx, dry cough, and pulmonary edema on CXR makes CHF exacerbation a possibility, however, BNP only 50.5. Denies chest pain.  She states she has had many visitors who may have had various cold/flu symptoms so bacterial/viral etiology is possible particularly given complaints of fever sensation despite afebrile temp readings.  PE considered given patient immobility, wells score of 3, and new back pain with tachycardia.  Pneumonia considered but made less likely by negative CXR for pneumonia. Aspiration pneumonia is also a possibility. Unclear how well patient does with swallowing at home.  -admit to telemetry under inpatient status, Dr. Deirdre Priest attending -zosyn -repeat CBC in AM -D-dimer -blood culture -IV lasix 40 once in AM (received lasix 40mg  IV x 1 in the ED) -continuous pulse ox -tele  CAUTI- chronic foley on running nitrofurantoin long term.  Cath was causing her issues and clogging lately.  Changed by home health on 10/4 and found to have clots and sediment per patient.  She is afebrile and states  this doesn't feel like past UTIs.  UA is grossly positive. Previous culture growing proteus and pseudomonas sensitive to zosyn. -zosyn -hold nitrofurantoin while on zosyn per pharmacy recommendations -urine culture  Generalized weakness/fatigue/sleepiness: Patient endorses feeling very tired and "out of it". Very broad differential for this. Respiratory etiology is a possibility given patient's shortness of breath (see above). Infection is on the differential, given patient's mildly elevated WBC count and feeling like she had a fever at home (see separate problem). UTI most likely source of infection. Cardiac etiology also a possibility. Patient noted to have a II/VI systolic murmur on exam, that has not been mentioned in any recent notes. EKG is without ST/T wave changes and patient denies chest pain, so think MI is less likely. Medications could be contributing, as patient is on Tramadol, Gabapentin, Valium, and Robaxin at home. No new meds recently. Stroke unlikely without focal neurological deficits. MS flare is also on the differential, although there seems to be more going on. Thyroid dysfunction unlikely with recent normal TSH.  - Will see how patient does with antibiotics and diuresis - If not improving, may need to broaden work-up - Decreased home dose of gabapentin and valium  Hypotension: Pt had BP down to 97/56 in the ED, improved to 119/53. Not on any BP meds at home. Could be due to infection vs chronic adrenal insufficiency from long term steroid use. - Monitor closely - Patient may need stress dose steroids  R ear pain: given other symptoms, otitis media possible.  Ear examined and although canal did not appear inflamed, ear was obstructed the view of the tympanic membrane -ear irrigation -re-examine after irrigation, if nurse cannot irrigate get supplies and  complete exam  Hypokalemia: K 3.1 in the ED.  - Repleted with K-dur x 1 in the ED - Recheck BMP in the  morning  DM2: SSI at home with lantus 13 -lantus 5 -SSI sensitive -regular diet  L index finger pain: patient either twisted or pulled her finger in a bed rail a few weeks ago, xray showed chip fracture -likely no splint necessary, will review ortho recs  Multiple Sclerosis with muscle spasms/pain in legs: chronic -Home gabapentin (decreased to BID) -home robaxin -home tramadol -home valium (reduced) -continue home Prednisone  daily  Chronic venous stasis changes: bilateral unna boots placed by home health RN this morning. - Monitor - Unna boots will need to be changed in 1 week  FEN/GI: regular meals, multivitamin Prophylaxis: lovenox  Disposition: Patient has home hospice, will plan for discharge back to home  History of Present Illness:  Barbara [name] is a 61 y.o. female presenting with weakness, back pain, and not acting like herself for the last couple of days. Her back pain normally hurts just in certain spots. Now, she is having pain from her shoulders to her toes. The pain is "aching". Her urinary catheter keeps "stopping up". They were unable to flush it today, so they had to put in a new one. The home health nurse also noted blood clots and sediment. She has had worsening shortness of breath starting yesterday afternoon. She has had a dry cough too. She has been wheezing for the last 3 days. No chest pain. She felt like she was running a fever, but all of her temperatures have been normal. She had a cold 1 week ago with runny nose and then she developed the cough. She also notes decreased appetite over the last couple of days. She has had some nausea but no vomiting. Her sister feels like she is out of it and has been going downhill for the last week. Patient feels like she is very tired. She doesn't think this feels like her normal UTIs. Also, she twisted her finger a few weeks ago. Recently, she re-hurt the finger on the [place] and then hurt it again today.  In the ED,  patient was afebrile but had blood pressures as low as 97/56. She was mildly tachycardic with HRs to 103-105. She was stable on her home 2L O2. Labs were significant for K3.1, WBC 10.6, Hgb 10.3, lactic acid 1.73. UA with large Hgb, large LE, positive nitrites, TNTC RBCs, 6-30 WBCs. CXR showed cardiomegaly with mild diffuse pulmonary edema. She was treated with Zosyn for presumed UTI and was given Lasix  IV x 1.  Review Of Systems: Per HPI with the following additions: see below.  Review of Systems  Constitutional: Negative for fever.  HENT: Negative for congestion and sore throat.   Eyes: Negative for blurred vision and double vision.  Respiratory: Positive for shortness of breath and wheezing.   Cardiovascular: Negative for chest pain and palpitations.  Gastrointestinal: Negative for abdominal pain and vomiting.  Genitourinary: Positive for flank pain. Negative for hematuria.  Musculoskeletal: Positive for back pain.  Neurological: Negative for dizziness and speech change.    Patient Active Problem List   Diagnosis Date Noted  . Heart failure, diastolic, chronic (HCC) 12/16/2016  . Pressure injury of skin 12/14/2016  . Neurogenic bladder 12/10/2016  . Acute on chronic respiratory failure (HCC) 12/09/2016  . Sepsis (HCC)   . Somnolence   . Mechanical complication due to bladder catheter (HCC) 12/08/2016  . Hematuria  12/08/2016  . Bladder catheter infection (HCC) 12/08/2016  . Pneumonia due to organism 12/08/2016  . Adverse reaction to antibiotic - Vancomycin 12/08/2016  . Hypoxia 12/07/2016  . UTI (urinary tract infection) 12/07/2016  . Chronic indwelling Foley catheter 11/29/2016  . Cellulitis, leg 09/24/2016  . Lower extremity weakness 04/02/2016  . Palliative care encounter 03/07/2016  . Current chronic use of systemic steroids 03/07/2016  . DM type 2, not at goal [facility]) 03/06/2016  . Weakness of both lower extremities   . Absence of bladder continence   . Essential  hypertension   . Encounter for chronic pain management 04/21/2014  . Chronic venous insufficiency 11/05/2012  . Cervical myelopathy (HCC) 08/07/2011  . Spinal stenosis in cervical region 06/03/2011  . Multiple sclerosis (HCC) 12/07/2010  . 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 Spinal stenosis of lumbar region 12/07/2010  . Morbid obesity (HCC) 12/07/2010  . Chronic gout 12/07/2010    Past Medical History: Past Medical History:  Diagnosis Date  . Absence of bladder continence   . 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 Cellulitis, leg 09/24/2016  . Cervical myelopathy (HCC) 08/07/2011  . Chronic gout 12/07/2010   Infrequent flairs   . Chronic indwelling Foley catheter 11/29/2016  . Chronic venous insufficiency 11/05/2012  . Current chronic use of systemic steroids 03/07/2016   No shingles vaccine  . Decubitus ulcer of sacral region   . Diabetes mellitus without complication (HCC)   . DM type 2, not at goal [facility]) 03/06/2016  . Essential hypertension   . Gout   . Hypertension   . Hypoxia   . Impaired mobility and activities of daily living 03/07/2016  . Lower back pain   . Lower extremity weakness 04/02/2016  . Lumbar spondylosis   . Morbid obesity (HCC) 12/07/2010   Has always been large.  Since MS has been trying and partially successful at wt loss.     . MS (multiple sclerosis) (HCC)   . Multiple sclerosis (HCC) 12/07/2010   Diagnosed 05/2010.  Managed by Dr. Sandria Manly   . Numbness   . Shortness of breath   . Spastic paraplegia   . Spinal stenosis   . Spinal stenosis in cervical region 06/03/2011    [facility] (Dr. Timoteo Ace) anterior discectomies and fusion at C5-6,6-7 & 7-1 done on 06/14/11.    Marland Kitchen Spinal stenosis of lumbar region 12/07/2010   Chronic low back pain.  Left leg weak but unclear if due to MS or spinal stenosis.  Pain well controled by tramadol.    Now (Jan 2013)  Managed  [facility], who plans at a later date lumbar laminectomy L2-4.   Marland Kitchen Weakness   . Weakness of both lower extremities     Past Surgical  History: Past Surgical History:  Procedure Laterality Date  . CERVICAL DISC SURGERY     Qusetions C5-6 C6-7 and C7-T1    Social History: Social History  Substance Use Topics  . Smoking status: Never Smoker  . Smokeless tobacco: Never Used  . Alcohol use No   Additional social history: lives at home on home hospice, sister is very involved and close  Please also refer to relevant sections of EMR.  Family History: Family History  Problem Relation Age of Onset  . Heart disease Mother   . Diabetes Mother   . Cancer Father     Allergies and Medications: Allergies  Allergen Reactions  . Metformin And Related Diarrhea and Nausea And Vomiting  . Vancomycin Rash  Significant "red man" like reaction on entire body that occurred several hours after Vancomycin infusion   No current facility-administered medications on file prior to encounter.    Current Outpatient Prescriptions on File Prior to Encounter  Medication Sig Dispense Refill  . Apple Cider Vinegar 188 MG CAPS Take 188 mg by mouth 2 (two) times daily.     Marland Kitchen aspirin EC 325 MG tablet Take 325 mg by mouth every evening.     . Cholecalciferol (VITAMIN D3) 1000 units CAPS Take 4,000 Units by mouth every evening.     . Coenzyme Q10 (COQ10) 100 MG CAPS Take 100 mg by mouth every evening.    . diazepam (VALIUM) 5 MG tablet TAKE 1 TABLET BY MOUTH EVERY 6 HOURS AS NEEDED FOR LEG SPASMS 90 tablet 5  . furosemide (LASIX) 20 MG tablet TAKE 1 TABLET BY MOUTH 2 TIMES DAILY. 180 tablet 3  . gabapentin (NEURONTIN) 300 MG capsule Take 1 capsule (300 mg total) by mouth 3 (three) times daily. 90 capsule 0  . ibuprofen (ADVIL,MOTRIN) 400 MG tablet Take 1 tablet (400 mg total) by mouth every 6 (six) hours as needed for headache or moderate pain. 30 tablet 0  . insulin aspart (NOVOLOG) 100 UNIT/ML FlexPen Inject 0-5 Units into the skin every morning.  15 mL 11  . Insulin Glargine (LANTUS) 100 UNIT/ML Solostar Pen Inject 10 Units into the skin  daily at 10 pm. (Patient taking differently: Inject 13 Units into the skin at bedtime. ) 15 mL 11  . Magnesium 250 MG TABS Take 250 mg by mouth every evening.     . Multiple Vitamin (MULTIVITAMIN WITH MINERALS) TABS tablet Take 1 tablet by mouth every evening.    . nitrofurantoin (MACRODANTIN) 50 MG capsule Take 1 capsule (50 mg total) by mouth 2 (two) times daily. 60 capsule 12  . traMADol (ULTRAM) 50 MG tablet Take 1 tablet (50 mg total) by mouth every 8 (eight) hours as needed. 90 tablet 0  . levofloxacin (LEVAQUIN) 500 MG tablet Take 1 tablet (500 mg total) by mouth daily. (Patient not taking: Reported on 02/06/2017) 4 tablet 0  . methocarbamol (ROBAXIN) 500 MG tablet Take 0.5 tablets (250 mg total) by mouth 3 (three) times daily as needed for muscle spasms. (Patient not taking: Reported on 02/06/2017) 90 tablet 0  . predniSONE (DELTASONE) 5 MG tablet Take 3 tablets (15 mg total) by mouth daily with breakfast. (Patient not taking: Reported on 02/06/2017) 30 tablet 0    Objective: BP (!) 97/56   Pulse 100   Temp 98.4 F (36.9 C) (Oral)   Resp 16   LMP 10/07/2010   SpO2 94%  Exam: General: patient pleasant and able to converse freely but appears drowsy and appeared to drift to sleep multiple times Eyes: no conjunctivits noted ENTM: R ear examined and tympanic membrane not visible Neck: no lesions noted, ROM grossly intact Cardiovascular: RRR, 2/6 murmur noted, slight squeaky wheeze noted on rhythm with heart at end expiration may be respiratory  Respiratory: some new end expiratory wheezing although full ausculation/lung exam is not possible given body habitus Gastrointestinal: no belly pain on exam, +BS, soft, non-distended, no rebound or guarding, no suprapubic tenderness MSK: advanced MS limits mobility significantly Derm: significant blistering/stasis changes in LE bilaterally, unna boots in place Neuro: Motor control grossly intact to patient's baseline which is significant inhibition  in mobility Psych: pleasant but weak/drowsy, alert and coherent in discussion  Labs and Imaging: CBC BMET   Recent  Labs Lab 02/06/17 1644  WBC 10.6*  HGB 10.3*  HCT 34.8*  PLT 275    Recent Labs Lab 02/06/17 1644  NA 139  K 3.1*  CL 87*  CO2 40*  BUN 6  CREATININE 0.78  GLUCOSE 255*  CALCIUM 8.8*       Parke Simmers, Scott DO 02/06/2017, 7:08 PM PGY-1, [facility] Family Medicine FPTS Intern pager: [phone], text pages welcome  FPTS Upper-Level Resident Addendum  I have independently interviewed and examined the patient. I have discussed the above with the original author and agree with their documentation. My edits for correction/addition/clarification are in blue. Please see also any attending notes.   Willadean Carol, MD PGY-3, [facility] Family Medicine FPTS Service pager: [phone] (text pages welcome through [facility])

## 2017-02-06 NOTE — ED Triage Notes (Signed)
Patient is from home, has indwelling urinary catheter for MS and urinary incontinence.  Urine is dark amber colored.  Recently emptied bag.  Temp 98.4 orally.  Patient is A&Ox4.  Patient has been getting progressively weaker over the last day.  States she keeps getting sleepy.

## 2017-02-07 ENCOUNTER — Encounter (HOSPITAL_COMMUNITY): Payer: Self-pay | Admitting: *Deleted

## 2017-02-07 DIAGNOSIS — R0602 Shortness of breath: Secondary | ICD-10-CM

## 2017-02-07 LAB — GLUCOSE, CAPILLARY
GLUCOSE-CAPILLARY: 275 mg/dL — AB (ref 65–99)
GLUCOSE-CAPILLARY: 230 mg/dL — AB (ref 65–99)
Glucose-Capillary: 260 mg/dL — ABNORMAL HIGH (ref 65–99)
Glucose-Capillary: 250 mg/dL — ABNORMAL HIGH (ref 65–99)
Glucose-Capillary: 282 mg/dL — ABNORMAL HIGH (ref 65–99)

## 2017-02-07 LAB — CBC
HEMATOCRIT: 33.4 % — AB (ref 36.0–46.0)
HEMOGLOBIN: 10 g/dL — AB (ref 12.0–15.0)
MCH: 26.9 pg (ref 26.0–34.0)
MCHC: 29.9 g/dL — AB (ref 30.0–36.0)
MCV: 89.8 fL (ref 78.0–100.0)
Platelets: 257 10*3/uL (ref 150–400)
RBC: 3.72 MIL/uL — AB (ref 3.87–5.11)
RDW: 21 % — ABNORMAL HIGH (ref 11.5–15.5)
WBC: 10.7 10*3/uL — ABNORMAL HIGH (ref 4.0–10.5)

## 2017-02-07 LAB — BASIC METABOLIC PANEL
Anion gap: 9 (ref 5–15)
BUN: <5 mg/dL — ABNORMAL LOW (ref 6–20)
CHLORIDE: 87 mmol/L — AB (ref 101–111)
CO2: 38 mmol/L — AB (ref 22–32)
CREATININE: 0.69 mg/dL (ref 0.44–1.00)
Calcium: 8.3 mg/dL — ABNORMAL LOW (ref 8.9–10.3)
GFR calc non Af Amer: >60 mL/min (ref >60)
Glucose, Bld: 237 mg/dL — ABNORMAL HIGH (ref 65–99)
POTASSIUM: 3.2 mmol/L — AB (ref 3.5–5.1)
Sodium: 134 mmol/L — ABNORMAL LOW (ref 135–145)

## 2017-02-07 LAB — MRSA PCR SCREENING: MRSA BY PCR: NEGATIVE

## 2017-02-07 MED ORDER — METHOCARBAMOL 500 MG PO TABS
250.0000 mg | ORAL_TABLET | Freq: Three times a day (TID) | ORAL | Status: DC | PRN
  Administered 2017-02-07 (×2): 250 mg via ORAL
  Filled 2017-02-07 (×2): qty 1

## 2017-02-07 MED ORDER — ADULT MULTIVITAMIN W/MINERALS CH
1.0000 | ORAL_TABLET | Freq: Every evening | ORAL | Status: DC
  Administered 2017-02-07: 1 via ORAL
  Filled 2017-02-07: qty 1

## 2017-02-07 MED ORDER — ASPIRIN EC 325 MG PO TBEC
325.0000 mg | DELAYED_RELEASE_TABLET | Freq: Every evening | ORAL | Status: DC
  Administered 2017-02-07: 325 mg via ORAL
  Filled 2017-02-07: qty 1

## 2017-02-07 MED ORDER — INSULIN ASPART 100 UNIT/ML ~~LOC~~ SOLN
0.0000 [IU] | Freq: Three times a day (TID) | SUBCUTANEOUS | Status: DC
  Administered 2017-02-07: 3 [IU] via SUBCUTANEOUS
  Administered 2017-02-07 (×2): 5 [IU] via SUBCUTANEOUS
  Administered 2017-02-08 (×2): 2 [IU] via SUBCUTANEOUS

## 2017-02-07 MED ORDER — TRAMADOL HCL 50 MG PO TABS
50.0000 mg | ORAL_TABLET | Freq: Three times a day (TID) | ORAL | Status: DC | PRN
  Administered 2017-02-07 – 2017-02-08 (×4): 50 mg via ORAL
  Filled 2017-02-07 (×4): qty 1

## 2017-02-07 MED ORDER — FUROSEMIDE 10 MG/ML IJ SOLN
40.0000 mg | Freq: Once | INTRAMUSCULAR | Status: AC
  Administered 2017-02-07: 40 mg via INTRAVENOUS
  Filled 2017-02-07: qty 4

## 2017-02-07 MED ORDER — DIAZEPAM 5 MG PO TABS
5.0000 mg | ORAL_TABLET | Freq: Four times a day (QID) | ORAL | Status: DC | PRN
  Administered 2017-02-07 – 2017-02-08 (×3): 5 mg via ORAL
  Filled 2017-02-07 (×3): qty 1

## 2017-02-07 MED ORDER — INSULIN ASPART 100 UNIT/ML ~~LOC~~ SOLN
0.0000 [IU] | Freq: Every day | SUBCUTANEOUS | Status: DC
  Administered 2017-02-07: 2 [IU] via SUBCUTANEOUS
  Administered 2017-02-07: 3 [IU] via SUBCUTANEOUS

## 2017-02-07 MED ORDER — ENOXAPARIN SODIUM 40 MG/0.4ML ~~LOC~~ SOLN
40.0000 mg | SUBCUTANEOUS | Status: DC
  Administered 2017-02-07: 40 mg via SUBCUTANEOUS
  Filled 2017-02-07: qty 0.4

## 2017-02-07 MED ORDER — PREDNISONE 5 MG PO TABS
5.0000 mg | ORAL_TABLET | Freq: Every day | ORAL | Status: DC
  Administered 2017-02-07 – 2017-02-08 (×2): 5 mg via ORAL
  Filled 2017-02-07 (×2): qty 1

## 2017-02-07 MED ORDER — METHOCARBAMOL 500 MG PO TABS
500.0000 mg | ORAL_TABLET | Freq: Three times a day (TID) | ORAL | Status: DC | PRN
  Administered 2017-02-07 – 2017-02-08 (×3): 500 mg via ORAL
  Filled 2017-02-07 (×3): qty 1

## 2017-02-07 MED ORDER — INSULIN GLARGINE 100 UNIT/ML ~~LOC~~ SOLN
5.0000 [IU] | Freq: Every day | SUBCUTANEOUS | Status: DC
  Administered 2017-02-07 (×2): 5 [IU] via SUBCUTANEOUS
  Filled 2017-02-07 (×3): qty 0.05

## 2017-02-07 MED ORDER — SODIUM CHLORIDE 0.9% FLUSH
3.0000 mL | Freq: Two times a day (BID) | INTRAVENOUS | Status: DC
  Administered 2017-02-07 – 2017-02-08 (×3): 3 mL via INTRAVENOUS

## 2017-02-07 MED ORDER — ENOXAPARIN SODIUM 80 MG/0.8ML ~~LOC~~ SOLN
70.0000 mg | SUBCUTANEOUS | Status: DC
  Administered 2017-02-08: 07:00:00 70 mg via SUBCUTANEOUS
  Filled 2017-02-07: qty 0.8

## 2017-02-07 MED ORDER — SODIUM CHLORIDE 0.9% FLUSH: 3.0000 mL | INTRAVENOUS | Status: DC | PRN

## 2017-02-07 MED ORDER — SODIUM CHLORIDE 0.9% FLUSH
3.0000 mL | Freq: Two times a day (BID) | INTRAVENOUS | Status: DC
  Administered 2017-02-07 – 2017-02-08 (×2): 3 mL via INTRAVENOUS

## 2017-02-07 MED ORDER — INFLUENZA VAC SPLIT QUAD 0.5 ML IM SUSY
0.5000 mL | PREFILLED_SYRINGE | INTRAMUSCULAR | Status: AC
  Administered 2017-02-08: 0.5 mL via INTRAMUSCULAR

## 2017-02-07 NOTE — Discharge Summary (Signed)
Family Medicine Teaching [facility] Discharge Summary  Patient name: Barbara [name] Medical record number: [id number] Date of birth: [birth year] Age: 61 y.o. Gender: female Date of Admission: 02/06/2017  Date of Discharge: 02/08/17    Admitting Physician: Carney Living, MD  Primary Care Provider: Moses Manners, MD Consultants:   Indication for Hospitalization: cough, weakness  Discharge Diagnoses/Problem List:  MS. CHF, morbid obesity, DM2  Disposition: to home with hospice home health  Discharge Condition: stable  Discharge Exam: General: patient pleasant and able to converse freely (final exam was performed by residents on shift day of discharge) Eyes: no conjunctivits noted ENTM: waiting for ear irrigation to be performed Neck: no lesions noted, ROM grossly intact Cardiovascular: RRR, 2/6 murmur noted, no wheezing Respiratory: wheezing almost completely resolved, no visible IWB above baseline, exam difficult given body habitus Gastrointestinal: no belly pain on exam, +BS, soft, non-distended, no rebound or guarding, no suprapubic tenderness MSK: advanced MS limits mobility significantly Derm: significant blistering/stasis changes in LE bilaterally, unna boots in place Neuro: Motor control grossly intact to patient's baseline which is significant inhibition in mobility Psych: pleasant, alert and coherent in discussion  Brief Hospital Course:  Patient had been complaining of increased weakness, tiredness and respiratory difficulties for a number of days before agreeing to be seen in hospital.   Presentation was concerning for pneumonia and she was treated with zosyn and diuresed using furosemide.   Her condition improved until she was deemed medically stable and requesting to go home on 10/6.   She has close followup with her pcp and home health.  Issues for Follow Up:  1. Considered and decided against stress dosing in light of possible stress dosing from  hospitalization.  Home dose had been . 2. Patient discharged on her home long term coverage of nitrofurantion for UTI.  Significant Procedures:   Significant Labs and Imaging:   Recent Labs Lab 02/06/17 1644 02/07/17 0515  WBC 10.6* 10.7*  HGB 10.3* 10.0*  HCT 34.8* 33.4*  PLT 275 257    Recent Labs Lab 02/06/17 1644 02/07/17 0515  NA 139 134*  K 3.1* 3.2*  CL 87* 87*  CO2 40* 38*  GLUCOSE 255* 237*  BUN 6 <5*  CREATININE 0.78 0.69  CALCIUM 8.8* 8.3*  ALKPHOS 90  --   AST 49*  --   ALT 22  --   ALBUMIN 2.5*  --     Dg Chest 2 View  Result Date: 02/06/2017 CLINICAL DATA:  Initial evaluation for acute weakness. EXAM: CHEST  2 VIEW COMPARISON:  Prior radiograph from 12/11/2016. FINDINGS: Stable cardiomegaly.  Mediastinal silhouette is unchanged. Lungs hypoinflated. Diffuse vascular congestion with interstitial prominence, suggesting mild pulmonary interstitial edema. No frank alveolar edema. No definite focal infiltrates. No definite pleural effusion. No pneumothorax. No acute osseous abnormality.  Cervical ACDF noted. IMPRESSION: Cardiomegaly with mild diffuse pulmonary interstitial edema/ congestion. Electronically Signed   By: Rise Mu M.D.   On: 02/06/2017 17:20   Ct Angio Chest Pe W Or Wo Contrast  Result Date: 02/06/2017 CLINICAL DATA:  Shortness of breath with cough EXAM: CT ANGIOGRAPHY CHEST WITH CONTRAST TECHNIQUE: Multidetector CT imaging of the chest was performed using the standard protocol during bolus administration of intravenous contrast. Multiplanar CT image reconstructions and MIPs were obtained to evaluate the vascular anatomy. CONTRAST:  80 cc Isovue 370 intravenous COMPARISON:  Radiograph 02/06/2017, CT chest 12/07/2016 FINDINGS: Cardiovascular: Satisfactory opacification of the pulmonary arteries to the segmental level. Possible small nonocclusive filling  defect within a right lower lobe segmental branch vessel. No other definitive filling  defects are visualized. Ectatic ascending aorta measuring up to 3.7 cm. No dissection. Enlarged pulmonary artery trunk measuring up to 4.2 cm. Mild coronary artery calcification. Mild cardiomegaly. No pericardial effusion Mediastinum/Nodes: No enlarged mediastinal, hilar, or axillary lymph nodes. Thyroid gland, trachea, and esophagus demonstrate no significant findings. Lungs/Pleura: Bilateral ground-glass densities. No large pleural effusion. More confluent ground-glass density in the right upper lobe. Negative for a pneumothorax. Upper Abdomen: Diffuse increased density in the gallbladder suspicious for sludge. Multiple stones in the gallbladder. Musculoskeletal: Degenerative changes. No acute or suspicious bone lesion. Postsurgical changes of the lower cervical spine. Review of the MIP images confirms the above findings. IMPRESSION: 1. Study degraded by body habitus. Possible small filling defect within a segmental right lower lobe pulmonary artery branch vessel, cannot exclude a small embolus in this region. 2. Enlarged pulmonary artery trunk suspicious for pulmonary artery hypertension 3. Diffuse bilateral ground-glass density with cardiomegaly, suggestive of pulmonary edema. More focal airspace disease in the right upper lobe may reflect additional pneumonia. 4. Sludge filled gallbladder with multiple stones Electronically Signed   By: Jasmine Pang M.D.   On: 02/06/2017 23:40   Dg Finger Index Left  Result Date: 02/06/2017 CLINICAL DATA:  Twisted index finger a few weeks ago with re-injury again today. EXAM: LEFT INDEX FINGER 2+V COMPARISON:  None. FINDINGS: Soft tissue swelling over the second PIP joint. Subtle acute versus chronic chip fracture along the volar base of the second distal phalanx. Possible subtle fracture along the base of the second middle phalanx seen only on the lateral film. Mild degenerative change at the first and second MCP joints. IMPRESSION: Chip fracture along the volar base of  the second distal phalanx which may be acute or chronic. Possible subtle fracture at the base of the second middle phalanx. Electronically Signed   By: Elberta Fortis M.D.   On: 02/06/2017 19:58    Results/Tests Pending at Time of Discharge:   Discharge Medications:  Allergies as of 02/08/2017      Reactions   Metformin And Related Diarrhea, Nausea And Vomiting   Vancomycin Rash   Significant "red man" like reaction on entire body that occurred several hours after Vancomycin infusion      Medication List    STOP taking these medications   levofloxacin 500 MG tablet Commonly known as:  LEVAQUIN     TAKE these medications   Apple Cider Vinegar 188 MG Caps Take 188 mg by mouth 2 (two) times daily.   aspirin EC 325 MG tablet Take 325 mg by mouth every evening.   CoQ10 100 MG Caps Take 100 mg by mouth every evening.   diazepam 5 MG tablet Commonly known as:  VALIUM TAKE 1 TABLET BY MOUTH EVERY 6 HOURS AS NEEDED FOR LEG SPASMS   furosemide 20 MG tablet Commonly known as:  LASIX TAKE 1 TABLET BY MOUTH 2 TIMES DAILY.   gabapentin 300 MG capsule Commonly known as:  NEURONTIN Take 1 capsule (300 mg total) by mouth 3 (three) times daily.   ibuprofen 400 MG tablet Commonly known as:  ADVIL,MOTRIN Take 1 tablet (400 mg total) by mouth every 6 (six) hours as needed for headache or moderate pain.   insulin aspart 100 UNIT/ML FlexPen Commonly known as:  NOVOLOG Inject 0-5 Units into the skin every morning.   Insulin Glargine 100 UNIT/ML Solostar Pen Commonly known as:  LANTUS Inject 10 Units into the  skin daily at 10 pm. What changed:  how much to take  when to take this   Magnesium 250 MG Tabs Take 250 mg by mouth every evening.   methocarbamol 500 MG tablet Commonly known as:  ROBAXIN Take 0.5 tablets (250 mg total) by mouth 3 (three) times daily as needed for muscle spasms.   multivitamin with minerals Tabs tablet Take 1 tablet by mouth every evening.    nitrofurantoin 50 MG capsule Commonly known as:  MACRODANTIN Take 1 capsule (50 mg total) by mouth 2 (two) times daily.   predniSONE 5 MG tablet Commonly known as:  DELTASONE Take 3 tablets (15 mg total) by mouth daily with breakfast. What changed:  Another medication with the same name was removed. Continue taking this medication, and follow the directions you see here.   traMADol 50 MG tablet Commonly known as:  ULTRAM Take 1 tablet (50 mg total) by mouth every 8 (eight) hours as needed.   Vitamin D3 1000 units Caps Take 4,000 Units by mouth every evening.       Discharge Instructions: Please refer to Patient Instructions section of EMR for full details.  Patient was counseled important signs and symptoms that should prompt return to medical care, changes in medications, dietary instructions, activity restrictions, and follow up appointments.   Follow-Up Appointments: Follow-up Information    [facility] Follow up.   Why:  they will continue to do your home health care at your home Contact information: [address] [place], [place] [address] tele # [phone]          Marthenia Rolling, DO 02/11/2017, 3:05 PM PGY-1, [facility] Family Medicine

## 2017-02-07 NOTE — Progress Notes (Signed)
Patient is active with [facility] in [place] [phone]); patient is active with them for HHRN/ PT/ nurses aide; resumption of services order placed; B Moreland RN,MHA,BSN [phone]

## 2017-02-07 NOTE — Consult Note (Signed)
   [facility] CM Inpatient Consult   02/07/2017  Barbara [name] [birth year] [id number]    Barbara [name] is active with [facility] Care Management program. She has been followed closely by [facility] RNCM and [facility] LCSW.   Please see chart review tab then encounters for those further patient outreach details.  Barbara [name] is also active with [facility] and Hospice agency. She is followed by home health not hospice. She has home health RN and aide services.   Barbara [name] also has private duty 24hr caregiver assistance.  [facility] RNCM indicates Barbara [name] has been reluctant to go to MD appointments, despite transportation being arranged by [facility].   However, [name] has been very collaborative and involved with Barbara [name]. MD Skype visit was set up for 01/12/17 per [facility] RNCM.  [facility] RNCM requests that Barbara [name] gets her flu shot while in the hospital.   Spoke with Barbara [name] briefly at bedside. She was drifting on and off to sleep. Made her aware that [facility] team is aware of her hospitalization. She is agreeable to ongoing [facility] follow up.  Spoke with inpatient RNCM and [facility] RNCM to discuss above.  Will continue to follow.   Raiford Noble, MSN-Ed, RN,BSN [facility] Liaison [phone]

## 2017-02-07 NOTE — Progress Notes (Signed)
Family Medicine Teaching Service Daily Progress Note Intern Pager: [phone]  Patient name: Barbara [name] Medical record number: [id number] Date of birth: 04-Jan-1956 Age: 61 y.o. Gender: female  Primary Care Provider: Moses Manners, MD Consultants:  Code Status: DNR  Pt Overview and Major Events to Date:  Barbara [name] is a 61 y.o. female presenting with respiratory changes, generalized weakness, new generalized back pain, clogged chronic foley with clots. PMH is significant for diastolic CHF w/ O2 dependancy 2L, MS, chronic foley, muscle spasms, morbid obesity, HTN, T2DM  Assessment and Plan: Barbara [name] is a 61 y.o. female presenting with respiratory changes, generalized weakness, new generalized back pain, clogged chronic foley with clots. PMH is significant for diastolic CHF w/ O2 dependancy 2L, MS, chronic foley, muscle spasms, morbid obesity, HTN, T2DM  Shortness of breath: Currently satting low 90s on her home 2L O2. CXR showed pulmonary edema, some new wheezing on end expiration although body habitus makes ausculation exam difficult, endorses rhinorhea but denies sore throat.  Claims a nonproductive cough for a few weeks. CHF hx, dry cough, and pulmonary edema on CXR makes CHF exacerbation a possibility, however, BNP only 50.5. Denies chest pain.  She states she has had many visitors who may have had various cold/flu symptoms so bacterial/viral etiology is possible particularly given complaints of fever sensation despite afebrile temp readings.  PE considered given patient immobility, wells score of 3, and new back pain with tachycardia.  Pneumonia considered but made less likely by negative CXR for pneumonia. Aspiration pneumonia is also a possibility. Unclear how well patient does with swallowing at home.   Elevated D-dimer at 2.71, CT (-) for large PE (possible right lobe subsegmental), did show ground glass and potential pulmonary hypertension.   Largely resolved AM  10/5 -admit to telemetry under inpatient status, Dr. Deirdre Priest attending -zosyn -repeat CBC in AM -blood culture pending -IV lasix 40 once in AM (received lasix  IV x 1 in the ED), discuss further diuresis -continuous pulse ox -tele  CAUTI- chronic foley on running nitrofurantoin long term.  Cath was causing her issues and clogging lately.  Changed by home health on 10/4 and found to have clots and sediment per patient.  She is afebrile and states this doesn't feel like past UTIs.  UA is grossly positive. Previous culture growing proteus and pseudomonas sensitive to zosyn. -zosyn -hold nitrofurantoin while on zosyn per pharmacy recommendations -urine culture pending  Generalized weakness/fatigue/sleepiness: Patient endorses feeling very tired and "out of it". Very broad differential for this. Respiratory etiology is a possibility given patient's shortness of breath (see above). Infection is on the differential, given patient's mildly elevated WBC count and feeling like she had a fever at home (see separate problem). UTI most likely source of infection. Cardiac etiology also a possibility. Patient noted to have a II/VI systolic murmur on exam, that has not been mentioned in any recent notes. EKG is without ST/T wave changes and patient denies chest pain, so think MI is less likely. Medications could be contributing, as patient is on Tramadol, Gabapentin, Valium, and Robaxin at home. No new meds recently. Stroke unlikely without focal neurological deficits. MS flare is also on the differential, although there seems to be more going on. Thyroid dysfunction unlikely with recent normal TSH. Lactic acid trending down from 1.73 to 1.62.   CT showed sludge fille gallbladder with multiple stones.   Largely resolved AM 10/5 - Will see how patient does with antibiotics and diuresis - If not improving,  may need to broaden work-up - Decreased home dose of gabapentin and valium, patient wants meds restarted  to home dose  Hypotension: Pt had BP down to 97/56 in the ED, improved to 119/53. Not on any BP meds at home. Could be due to infection vs chronic adrenal insufficiency from long term steroid use. - Monitor closely - Patient may need stress dose steroids  R ear pain: given other symptoms, otitis media possible.  Ear examined and although canal did not appear inflamed, ear was obstructed the view of the tympanic membrane -ear irrigation -re-examine after irrigation, if nurse cannot irrigate get supplies and complete exam  Hypokalemia: K 3.1 in the ED.  - Repleted with K-dur x 1 in the ED - Recheck BMP in the morning  DM2: SSI at home with lantus 13 -lantus 5 -SSI sensitive -regular diet  L index finger pain: patient either twisted or pulled her finger in a bed rail a few weeks ago, xray showed chip fracture -likely no splint necessary, will review ortho recs  Multiple Sclerosis with muscle spasms/pain in legs: chronic -Home gabapentin (decreased to BID) -home robaxin -home tramadol -home valium (reduced) -continue home Prednisone 5mg  daily  Chronic venous stasis changes: bilateral unna boots placed by home health RN this morning. - Monitor - Unna boots will need to be changed in 1 week  FEN/GI: regular meals, multivitamin Prophylaxis: lovenox  Disposition: Patient has home hospice, will plan for discharge back to home  Subjective:  Patient is more concerned with being well enough to stay of hospital than the speed with which she leaves the hospital.  She would like her home dose meds  Objective: Temp:  [98.4 F (36.9 C)-98.6 F (37 C)] 98.4 F (36.9 C) (10/05 0503) Pulse Rate:  [88-106] 88 (10/05 0503) Resp:  [14-18] 18 (10/05 0503) BP: (95-124)/(52-72) 95/52 (10/05 0503) SpO2:  [92 %-100 %] 96 % (10/05 0503) Weight:  [325 lb (147.4 kg)] 325 lb (147.4 kg) (10/05 0029) Physical Exam: General: patient pleasant and able to converse freely  Eyes: no  conjunctivits noted ENTM: waiting for ear irrigation to be performed Neck: no lesions noted, ROM grossly intact Cardiovascular: RRR, 2/6 murmur noted, slight squeaky wheeze noted on rhythm with heart at end expiration may be respiratory  Respiratory: wheezing almost completely resolved this AM, no visible IWB above baseline, exam difficult givwen body habitus Gastrointestinal: no belly pain on exam, +BS, soft, non-distended, no rebound or guarding, no suprapubic tenderness MSK: advanced MS limits mobility significantly Derm: significant blistering/stasis changes in LE bilaterally, unna boots in place Neuro: Motor control grossly intact to patient's baseline which is significant inhibition in mobility Psych: pleasant, alert and coherent in discussion  Laboratory:  Recent Labs Lab 02/06/17 1644 02/07/17 0515  WBC 10.6* 10.7*  HGB 10.3* 10.0*  HCT 34.8* 33.4*  PLT 275 257    Recent Labs Lab 02/06/17 1644 02/07/17 0515  NA 139 134*  K 3.1* 3.2*  CL 87* 87*  CO2 40* 38*  BUN 6 <5*  CREATININE 0.78 0.69  CALCIUM 8.8* 8.3*  PROT 6.3*  --   BILITOT 0.8  --   ALKPHOS 90  --   ALT 22  --   AST 49*  --   GLUCOSE 255* 237*      Imaging/Diagnostic Tests: Dg Chest 2 View  Result Date: 02/06/2017 CLINICAL DATA:  Initial evaluation for acute weakness. EXAM: CHEST  2 VIEW COMPARISON:  Prior radiograph from 12/11/2016. FINDINGS: Stable cardiomegaly.  Mediastinal  silhouette is unchanged. Lungs hypoinflated. Diffuse vascular congestion with interstitial prominence, suggesting mild pulmonary interstitial edema. No frank alveolar edema. No definite focal infiltrates. No definite pleural effusion. No pneumothorax. No acute osseous abnormality.  Cervical ACDF noted. IMPRESSION: Cardiomegaly with mild diffuse pulmonary interstitial edema/ congestion. Electronically Signed   By: Rise Mu M.D.   On: 02/06/2017 17:20   Ct Angio Chest Pe W Or Wo Contrast  Result Date:  02/06/2017 CLINICAL DATA:  Shortness of breath with cough EXAM: CT ANGIOGRAPHY CHEST WITH CONTRAST TECHNIQUE: Multidetector CT imaging of the chest was performed using the standard protocol during bolus administration of intravenous contrast. Multiplanar CT image reconstructions and MIPs were obtained to evaluate the vascular anatomy. CONTRAST:  80 cc Isovue 370 intravenous COMPARISON:  Radiograph 02/06/2017, CT chest 12/07/2016 FINDINGS: Cardiovascular: Satisfactory opacification of the pulmonary arteries to the segmental level. Possible small nonocclusive filling defect within a right lower lobe segmental branch vessel. No other definitive filling defects are visualized. Ectatic ascending aorta measuring up to 3.7 cm. No dissection. Enlarged pulmonary artery trunk measuring up to 4.2 cm. Mild coronary artery calcification. Mild cardiomegaly. No pericardial effusion Mediastinum/Nodes: No enlarged mediastinal, hilar, or axillary lymph nodes. Thyroid gland, trachea, and esophagus demonstrate no significant findings. Lungs/Pleura: Bilateral ground-glass densities. No large pleural effusion. More confluent ground-glass density in the right upper lobe. Negative for a pneumothorax. Upper Abdomen: Diffuse increased density in the gallbladder suspicious for sludge. Multiple stones in the gallbladder. Musculoskeletal: Degenerative changes. No acute or suspicious bone lesion. Postsurgical changes of the lower cervical spine. Review of the MIP images confirms the above findings. IMPRESSION: 1. Study degraded by body habitus. Possible small filling defect within a segmental right lower lobe pulmonary artery branch vessel, cannot exclude a small embolus in this region. 2. Enlarged pulmonary artery trunk suspicious for pulmonary artery hypertension 3. Diffuse bilateral ground-glass density with cardiomegaly, suggestive of pulmonary edema. More focal airspace disease in the right upper lobe may reflect additional pneumonia. 4.  Sludge filled gallbladder with multiple stones Electronically Signed   By: Jasmine Pang M.D.   On: 02/06/2017 23:40   Dg Finger Index Left  Result Date: 02/06/2017 CLINICAL DATA:  Twisted index finger a few weeks ago with re-injury again today. EXAM: LEFT INDEX FINGER 2+V COMPARISON:  None. FINDINGS: Soft tissue swelling over the second PIP joint. Subtle acute versus chronic chip fracture along the volar base of the second distal phalanx. Possible subtle fracture along the base of the second middle phalanx seen only on the lateral film. Mild degenerative change at the first and second MCP joints. IMPRESSION: Chip fracture along the volar base of the second distal phalanx which may be acute or chronic. Possible subtle fracture at the base of the second middle phalanx. Electronically Signed   By: Elberta Fortis M.D.   On: 02/06/2017 19:58     Marthenia Rolling, DO 02/07/2017, 6:26 AM PGY-1, [facility] FPTS Intern pager: [phone], text pages welcome

## 2017-02-07 NOTE — Progress Notes (Signed)
Pt arrived to the unit with foley catheter placed at home by home health RN on 02/06/17 at 1330 per patient.

## 2017-02-07 NOTE — Progress Notes (Signed)
Results for Barbara [name], Barbara [name] (MRN [id number]) as of 02/07/2017 08:54  Ref. Range 02/07/2017 01:59 02/07/2017 08:12  Glucose-Capillary Latest Ref Range: 65 - 99 mg/dL 016 (H) 010 (H)  Noted that blood sugars are greater than 180 mg/dl.  Patient takes Lantus 13 units daily at home.  Recommend increasing Lantus to 10-15 units daily if blood sugars continue to be elevated. Will continue to monitor blood sugars while in the hospital.

## 2017-02-07 NOTE — Progress Notes (Signed)
Pt arrived to the unit at this time. Alert and oriented X4. Generalized edema but more to lower extremities and abdomen. Reports comfortable in bed, Call bell within reach. Able to make needs known. Will continue to monitor.

## 2017-02-07 NOTE — Consult Note (Addendum)
WOC Nurse wound consult note Reason for Consult: Consult requested for bilat legs.  Pt is familiar to [facility] team from previous admission, refer to note on 6/25.  She is followed by home health prior to admission and wears Una boots for chronic full thickness stasis ulcers.  These were just applied yesterday.  WOC will plan to remove and assess next Thurs if patient is still in the hospital at that time.  She can resume follow-up with home health after discharge. Cammie Mcgee MSN, RN, CWOCN, [place], CNS [phone]

## 2017-02-08 DIAGNOSIS — T83511A Infection and inflammatory reaction due to indwelling urethral catheter, initial encounter: Principal | ICD-10-CM

## 2017-02-08 DIAGNOSIS — R059 Cough, unspecified: Secondary | ICD-10-CM

## 2017-02-08 DIAGNOSIS — R05 Cough: Secondary | ICD-10-CM

## 2017-02-08 DIAGNOSIS — R531 Weakness: Secondary | ICD-10-CM

## 2017-02-08 DIAGNOSIS — N390 Urinary tract infection, site not specified: Secondary | ICD-10-CM

## 2017-02-08 LAB — GLUCOSE, CAPILLARY
Glucose-Capillary: 170 mg/dL — ABNORMAL HIGH (ref 65–99)
Glucose-Capillary: 200 mg/dL — ABNORMAL HIGH (ref 65–99)

## 2017-02-08 LAB — URINE CULTURE

## 2017-02-08 NOTE — Progress Notes (Signed)
[facility] at bedside , report given, [facility] has all paperwork. Pt discharged via stretcher

## 2017-02-08 NOTE — Progress Notes (Signed)
Pt IV discontinued, catheter intact and telemetry removed. Pt has all belongings including home hoyer pad. PT discharge education provided at bedside with pt. Awaiting [facility]

## 2017-02-08 NOTE — Progress Notes (Addendum)
Patient is for discharge today, she is active with [facility]  [place] for [facility], PT and aide, will resume.  NCM notified [facility] service at [phone] of patient's discharge today and would like to resume services.  Patient is also active with [facility], and she has 24 hr private duty nursing services.  Patient will require [facility] transport at discharge, NCM verified address with patient , she is on 2 liters of oxygen and she also has home oxygen.  NCM will facilitate transport with [facility].  NCM called ptar at [phone] scheduled pick up for 4:30 pm [phone].

## 2017-02-08 NOTE — Progress Notes (Signed)
Notified doctor of patient's urine being pink.

## 2017-02-10 ENCOUNTER — Other Ambulatory Visit: Payer: Self-pay | Admitting: *Deleted

## 2017-02-10 NOTE — Telephone Encounter (Signed)
Patient seen and admitted to hospital.  skype FU planned for tomorrow.

## 2017-02-10 NOTE — Patient Outreach (Signed)
[facility]) Care Management  02/10/2017  Angee Fitting 1956-02-14 [id number]   Transition of care call Patient Admitted 10/4  Dx: UTI  Discharged 02/08/17  Placed call to patient on today, reports being a little tired and sleepy on today. Patient denies increase of back pain or leg pain as prior to admission, no fever,  foley in place, reports draining dark colored urine, denies leaking at foley site. Patient reports being able to  Take  her medications as prescribed.  Patient reports home health has not visited yet, she has received a call reporting they have to check with insurance, patient was unsure when they will visit. Placed call to [facility] health and they verify patient will be seen by nurse Olegario Messier on 10/9,and patient will receive a call before hand.  Discussed with patient previously scheduled skype home visit with PCP on 10/9, she is agreeable.Sent in basket message to PCP as reminder.   Plan Will continue weekly transition of care outreach, planned home visit this week, and will complete transition of care flow sheet.   Egbert Garibaldi, RN, [facility] Care Management,Care Management Coordinator  [phone]- Mobile [phone]- Toll Free Main Office

## 2017-02-11 ENCOUNTER — Other Ambulatory Visit: Payer: Self-pay | Admitting: *Deleted

## 2017-02-11 ENCOUNTER — Telehealth: Payer: Self-pay | Admitting: Family Medicine

## 2017-02-11 ENCOUNTER — Encounter: Payer: Self-pay | Admitting: *Deleted

## 2017-02-11 NOTE — Patient Outreach (Signed)
[facility]) Care Management  02/11/2017  Barbara [name] January 02, 1956 [id number]   CSW contacted patient who is awaiting her visit from [facility] RNCM to complete the SKYPE call with PCP. CSW discussed [facility] transportation with her and will investigate logistics further to clarify options for her. She is in good spirits; talked about opportunities to consider venturing out with [facility] for haircut, a lunch out, etc.   CSW will plan f/u call next week and have encouraged patient to call if needs arise as well as for ride assistance.       Reece Levy, MSW, LCSW Clinical Social Worker  [facility] [employer] [phone]

## 2017-02-11 NOTE — Patient Outreach (Signed)
[facility]) Care Management   02/12/2017  Barbara [name] 04-30-1956 [id number]  Barbara [name] is an 61 y.o. female  Subjective:  Patient reports feeling some better today compared to yesterday.  Patient denies pain, congestion or increased cough, no fever.    Objective: BP 120/80 (BP Location: Right Arm, Patient Position: Sitting, Cuff Size: Large)   Pulse 88   Resp 18   LMP 10/07/2010   SpO2 91%  Patient resting in recliner chair. Oxygen at 2 liters Review of Systems  Constitutional: Negative.   HENT: Negative.   Eyes:       Decrease vision   Respiratory: Negative.   Cardiovascular: Positive for leg swelling.  Gastrointestinal: Negative.   Genitourinary: Negative.   Musculoskeletal:       Right index finger, stiffness   Skin: Negative.   Endo/Heme/Allergies: Negative.   Psychiatric/Behavioral: Negative.     Physical Exam  Constitutional: She is oriented to person, place, and time. She appears well-developed and well-nourished.  Cardiovascular: Normal rate, normal heart sounds and intact distal pulses.   Respiratory: Effort normal.  GI: Soft. Bowel sounds are normal.  Neurological: She is alert and oriented to person, place, and time.  Skin: Skin is warm and dry.     Psychiatric: She has a normal mood and affect. Her behavior is normal. Judgment and thought content normal.    Encounter Medications:   Outpatient Encounter Prescriptions as of 02/11/2017  Medication Sig Note  . Apple Cider Vinegar 188 MG CAPS Take 188 mg by mouth 2 (two) times daily.    Marland Kitchen aspirin EC 325 MG tablet Take 325 mg by mouth every evening.    . Cholecalciferol (VITAMIN D3) 1000 units CAPS Take 4,000 Units by mouth every evening.    . Coenzyme Q10 (COQ10) 100 MG CAPS Take 100 mg by mouth every evening.   . diazepam (VALIUM) 5 MG tablet TAKE 1 TABLET BY MOUTH EVERY 6 HOURS AS NEEDED FOR LEG SPASMS   . furosemide (LASIX) 20 MG tablet TAKE 1 TABLET BY MOUTH 2 TIMES DAILY.    Marland Kitchen gabapentin (NEURONTIN) 300 MG capsule Take 1 capsule (300 mg total) by mouth 3 (three) times daily.   Marland Kitchen ibuprofen (ADVIL,MOTRIN) 400 MG tablet Take 1 tablet (400 mg total) by mouth every 6 (six) hours as needed for headache or moderate pain.   Marland Kitchen insulin aspart (NOVOLOG) 100 UNIT/ML FlexPen Inject 0-5 Units into the skin every morning.    . Insulin Glargine (LANTUS) 100 UNIT/ML Solostar Pen Inject 10 Units into the skin daily at 10 pm. (Patient taking differently: Inject 13 Units into the skin at bedtime. )   . Magnesium 250 MG TABS Take 250 mg by mouth every evening.    . methocarbamol (ROBAXIN) 500 MG tablet Take 0.5 tablets (250 mg total) by mouth 3 (three) times daily as needed for muscle spasms.   . Multiple Vitamin (MULTIVITAMIN WITH MINERALS) TABS tablet Take 1 tablet by mouth every evening.   . nitrofurantoin (MACRODANTIN) 50 MG capsule Take 1 capsule (50 mg total) by mouth 2 (two) times daily. 10/23/2016: Currently has no end date.   . predniSONE (DELTASONE) 5 MG tablet Take 3 tablets (15 mg total) by mouth daily with breakfast.   . traMADol (ULTRAM) 50 MG tablet Take 1 tablet (50 mg total) by mouth every 8 (eight) hours as needed.    No facility-administered encounter medications on file as of 02/11/2017.   Patient was recently discharged from hospital and all medications  have been reviewed.  Functional Status:   In your present state of health, do you have any difficulty performing the following activities: 02/11/2017 02/07/2017  Hearing? N N  Vision? Y Y  Difficulty concentrating or making decisions? N N  Walking or climbing stairs? Y Y  Comment - -  Dressing or bathing? Y Y  Comment - -  Doing errands, shopping? Y [place] and eating ? Y -  Comment caregiver assist -  Using the Toilet? Y -  Comment wears depend -  In the past six months, have you accidently leaked urine? Y -  Comment foley -  Do you have problems with loss of bowel control? Y -    Comment - -  Managing your Medications? N -  Managing your Finances? N -  Housekeeping or managing your Housekeeping? Y -  Comment caregiver assist -  Some recent data might be hidden    Fall/Depression Screening:    Fall Risk  02/11/2017 12/31/2016 12/20/2016  Falls in the past year? Yes Yes Yes  Number falls in past yr: 1 - 1  Injury with Fall? No - -  Comment - - -  Risk Factor Category  - - -  Risk for fall due to : Impaired mobility - Other (Comment)  Risk for fall due to: Comment uses hoyer lift now for transport  - patient is immobile  Follow up - - Falls prevention discussed   PHQ 2/9 Scores 02/11/2017 12/27/2016 10/03/2016 08/06/2016 05/14/2016 03/06/2016 02/09/2015  PHQ - 2 Score 1 1 0 0 0 1 0    Assessment:  Transition of care home visit, face time visit with Dr.Hensel. Patient caregiver Tamela Oddi present.  Patient is followed by [facility] home health care, RN , bath aide and being evaluated for possible physical therapy.    1.Transition of care - reviewed program 2.Recurrent UTI-  Urine clear in foley today, home health RN visit today, and foley was flushed, catheter size has recently been increased to size 18, patient reports somewhat less symptoms since change. 3.Diabetes - Patient monitoring blood sugar daily, 30 day average 310, denies having any hypoglycemia episodes, reports taking insulin as prescribed. Patient discussed insulin cost concerns along with  Discussed healthy balanced diet options.   4.Bilateral lower legs with UNNA boots - dressing change by [facility] on today, dressing intact and dry.  4.Social- Patient continues with 24 hour caregiver support, reports support from her sisters in helping with decisions on caregivers, and current situation possibly making some changes. Patient plans to continue to stay at home with continued 24 hours support as long as possible, [facility] will be her choice when she feels it is time and may decide to make contact sooner   for future needs.     Plan:  1. Will continue weekly transition of care outreaches, next call in a week  2.Reinforce early signs of UTI, to notify MD sooner,  3. Encouraged to notify MD of consistent elevations of blood sugar greater than 250, will notify [facility] pharmacist regarding patient concern related to insulin cost.  4. Reinforced elevating legs throughout the day.   Will send PCP visit note  [facility] CM Care Plan Problem One     Most Recent Value  Care Plan Problem One  Recent hospital admission related UTI  Role Documenting the Problem One  Care Management [place] for Problem One  Active  [facility] Long Term Goal   Patient  will not expeirence a hospital admission in the next 31 days   [place] Term Goal Start Date  02/10/17  Interventions for Problem One Long Term Goal  Advised regarding taking medications as prescribed, notifying MD/RN/HH of new concerns sooner   [facility] CM Short Term Goal #1   Patient will report having visit with PCP in the next 30 days   [facility] CM Short Term Goal #1 Start Date  02/10/17  [facility] CM Short Term Goal #1 Met Date  02/12/17  [facility] CM Short Term Goal #2   Patient will be able to state symptoms of UTI and action plan in the next 30 days   [facility] CM Short Term Goal #2 Start Date  02/10/17  Interventions for Short Term Goal #2  Reinforced signs of UTI, change in color, pain in back legs  [facility] CM Short Term Goal #3  Patient will report doing upper body exercises at least 5 days a week   [facility] CM Short Term Goal #3 Start Date  02/11/17  Interventions for Short Tern Goal #3  Educated on benefits of continued stretching exercise at tolerated, using bands      Joylene Draft, RN, [place] Management Coordinator  [phone]- Mobile [phone]- [place]

## 2017-02-11 NOTE — Telephone Encounter (Signed)
Did FaceTime visit with patient and [facility] case manager, Ander Purpura.  She was hospitalized for a couple of days for  1. Fluid overload 2. Concern with UTI.  States legs swelling down, no weeping. No fever or flank pain.  Catheter occlusion has been an issue.  It has markedly improved with larger bore catheter and frequent irrigations.    Given permission to use my cell phone to contact me.    She feels that she is coming to the end of her ability to live at home.  She will be proactive and contact [facility] to prepare for future placement.    No other issues.

## 2017-02-12 LAB — CULTURE, BLOOD (SINGLE)
Culture: NO GROWTH
SPECIAL REQUESTS: ADEQUATE

## 2017-02-15 DIAGNOSIS — E119 Type 2 diabetes mellitus without complications: Secondary | ICD-10-CM | POA: Diagnosis not present

## 2017-02-15 DIAGNOSIS — R531 Weakness: Secondary | ICD-10-CM | POA: Diagnosis not present

## 2017-02-15 DIAGNOSIS — J9620 Acute and chronic respiratory failure, unspecified whether with hypoxia or hypercapnia: Secondary | ICD-10-CM | POA: Diagnosis not present

## 2017-02-15 DIAGNOSIS — M4802 Spinal stenosis, cervical region: Secondary | ICD-10-CM | POA: Diagnosis not present

## 2017-02-15 DIAGNOSIS — L89152 Pressure ulcer of sacral region, stage 2: Secondary | ICD-10-CM | POA: Diagnosis not present

## 2017-02-15 DIAGNOSIS — G35 Multiple sclerosis: Secondary | ICD-10-CM | POA: Diagnosis not present

## 2017-02-15 DIAGNOSIS — R0602 Shortness of breath: Secondary | ICD-10-CM | POA: Diagnosis not present

## 2017-02-15 DIAGNOSIS — M6281 Muscle weakness (generalized): Secondary | ICD-10-CM | POA: Diagnosis not present

## 2017-02-15 DIAGNOSIS — I5032 Chronic diastolic (congestive) heart failure: Secondary | ICD-10-CM | POA: Diagnosis not present

## 2017-02-17 ENCOUNTER — Ambulatory Visit: Payer: Self-pay | Admitting: *Deleted

## 2017-02-17 ENCOUNTER — Telehealth: Payer: Self-pay | Admitting: *Deleted

## 2017-02-17 NOTE — Telephone Encounter (Signed)
Called.  Yes change una boot.

## 2017-02-17 NOTE — Telephone Encounter (Signed)
Marisue Ivan, RN with [facility] left message on nurse line stating she was at the home to complete an unna boot change. Patient has 6 large blisters on right leg and would like to know if unna boot should be changed. Please call [phone].  Clovis Pu, RN

## 2017-02-18 ENCOUNTER — Other Ambulatory Visit: Payer: Self-pay | Admitting: *Deleted

## 2017-02-18 NOTE — Patient Outreach (Signed)
[facility]) Care Management  02/18/2017  Ronya Gilcrest [birth year] [id number]   Transition of care call  Placed call to patient,no answer on mobile number,able to leave a hipaa compliant message requesting a return call, unable to connect to home phone.    Plan Will await return call if no response will plan follow up call later in week.  Egbert Garibaldi, RN, [facility] Care Management,Care Management Coordinator  [phone]- Mobile [phone]- Toll Free Main Office

## 2017-02-19 ENCOUNTER — Ambulatory Visit: Payer: Self-pay | Admitting: *Deleted

## 2017-02-19 ENCOUNTER — Emergency Department (HOSPITAL_COMMUNITY): Payer: PPO

## 2017-02-19 ENCOUNTER — Inpatient Hospital Stay (HOSPITAL_COMMUNITY)
Admission: EM | Admit: 2017-02-19 | Discharge: 2017-02-21 | DRG: 291 | Disposition: A | Discharge status: Hospice | Payer: PPO | Attending: Family Medicine | Admitting: Family Medicine

## 2017-02-19 DIAGNOSIS — H5461 Unqualified visual loss, right eye, normal vision left eye: Secondary | ICD-10-CM | POA: Diagnosis not present

## 2017-02-19 DIAGNOSIS — Z86711 Personal history of pulmonary embolism: Secondary | ICD-10-CM

## 2017-02-19 DIAGNOSIS — I5040 Unspecified combined systolic (congestive) and diastolic (congestive) heart failure: Secondary | ICD-10-CM | POA: Diagnosis not present

## 2017-02-19 DIAGNOSIS — G35 Multiple sclerosis: Secondary | ICD-10-CM | POA: Diagnosis present

## 2017-02-19 DIAGNOSIS — Z515 Encounter for palliative care: Secondary | ICD-10-CM

## 2017-02-19 DIAGNOSIS — R0609 Other forms of dyspnea: Secondary | ICD-10-CM | POA: Diagnosis not present

## 2017-02-19 DIAGNOSIS — Z833 Family history of diabetes mellitus: Secondary | ICD-10-CM

## 2017-02-19 DIAGNOSIS — L8992 Pressure ulcer of unspecified site, stage 2: Secondary | ICD-10-CM | POA: Diagnosis not present

## 2017-02-19 DIAGNOSIS — Z981 Arthrodesis status: Secondary | ICD-10-CM

## 2017-02-19 DIAGNOSIS — L89312 Pressure ulcer of right buttock, stage 2: Secondary | ICD-10-CM | POA: Diagnosis not present

## 2017-02-19 DIAGNOSIS — Z9981 Dependence on supplemental oxygen: Secondary | ICD-10-CM

## 2017-02-19 DIAGNOSIS — N319 Neuromuscular dysfunction of bladder, unspecified: Secondary | ICD-10-CM | POA: Diagnosis not present

## 2017-02-19 DIAGNOSIS — E872 Acidosis, unspecified: Secondary | ICD-10-CM | POA: Insufficient documentation

## 2017-02-19 DIAGNOSIS — J9622 Acute and chronic respiratory failure with hypercapnia: Secondary | ICD-10-CM | POA: Diagnosis present

## 2017-02-19 DIAGNOSIS — G8220 Paraplegia, unspecified: Secondary | ICD-10-CM | POA: Diagnosis present

## 2017-02-19 DIAGNOSIS — Z66 Do not resuscitate: Secondary | ICD-10-CM | POA: Diagnosis present

## 2017-02-19 DIAGNOSIS — R238 Other skin changes: Secondary | ICD-10-CM | POA: Diagnosis present

## 2017-02-19 DIAGNOSIS — M48061 Spinal stenosis, lumbar region without neurogenic claudication: Secondary | ICD-10-CM | POA: Diagnosis not present

## 2017-02-19 DIAGNOSIS — I5033 Acute on chronic diastolic (congestive) heart failure: Secondary | ICD-10-CM | POA: Diagnosis not present

## 2017-02-19 DIAGNOSIS — L89892 Pressure ulcer of other site, stage 2: Secondary | ICD-10-CM | POA: Diagnosis not present

## 2017-02-19 DIAGNOSIS — E118 Type 2 diabetes mellitus with unspecified complications: Secondary | ICD-10-CM | POA: Diagnosis not present

## 2017-02-19 DIAGNOSIS — M545 Low back pain: Secondary | ICD-10-CM | POA: Diagnosis present

## 2017-02-19 DIAGNOSIS — Z881 Allergy status to other antibiotic agents status: Secondary | ICD-10-CM

## 2017-02-19 DIAGNOSIS — Z8701 Personal history of pneumonia (recurrent): Secondary | ICD-10-CM | POA: Diagnosis not present

## 2017-02-19 DIAGNOSIS — J9621 Acute and chronic respiratory failure with hypoxia: Secondary | ICD-10-CM | POA: Diagnosis present

## 2017-02-19 DIAGNOSIS — G8929 Other chronic pain: Secondary | ICD-10-CM | POA: Diagnosis not present

## 2017-02-19 DIAGNOSIS — Z7952 Long term (current) use of systemic steroids: Secondary | ICD-10-CM

## 2017-02-19 DIAGNOSIS — I509 Heart failure, unspecified: Secondary | ICD-10-CM | POA: Diagnosis not present

## 2017-02-19 DIAGNOSIS — I872 Venous insufficiency (chronic) (peripheral): Secondary | ICD-10-CM | POA: Diagnosis present

## 2017-02-19 DIAGNOSIS — Z6841 Body Mass Index (BMI) 40.0 and over, adult: Secondary | ICD-10-CM | POA: Diagnosis not present

## 2017-02-19 DIAGNOSIS — Z7982 Long term (current) use of aspirin: Secondary | ICD-10-CM

## 2017-02-19 DIAGNOSIS — R0602 Shortness of breath: Secondary | ICD-10-CM | POA: Diagnosis not present

## 2017-02-19 DIAGNOSIS — J810 Acute pulmonary edema: Secondary | ICD-10-CM | POA: Diagnosis not present

## 2017-02-19 DIAGNOSIS — E119 Type 2 diabetes mellitus without complications: Secondary | ICD-10-CM | POA: Diagnosis present

## 2017-02-19 DIAGNOSIS — J9600 Acute respiratory failure, unspecified whether with hypoxia or hypercapnia: Secondary | ICD-10-CM | POA: Diagnosis present

## 2017-02-19 DIAGNOSIS — J9601 Acute respiratory failure with hypoxia: Secondary | ICD-10-CM | POA: Diagnosis not present

## 2017-02-19 DIAGNOSIS — Z888 Allergy status to other drugs, medicaments and biological substances status: Secondary | ICD-10-CM

## 2017-02-19 DIAGNOSIS — I110 Hypertensive heart disease with heart failure: Secondary | ICD-10-CM | POA: Diagnosis not present

## 2017-02-19 DIAGNOSIS — Z794 Long term (current) use of insulin: Secondary | ICD-10-CM

## 2017-02-19 DIAGNOSIS — B999 Unspecified infectious disease: Secondary | ICD-10-CM | POA: Diagnosis not present

## 2017-02-19 DIAGNOSIS — J9602 Acute respiratory failure with hypercapnia: Secondary | ICD-10-CM | POA: Diagnosis not present

## 2017-02-19 DIAGNOSIS — R9431 Abnormal electrocardiogram [ECG] [EKG]: Secondary | ICD-10-CM | POA: Diagnosis not present

## 2017-02-19 DIAGNOSIS — Z7189 Other specified counseling: Secondary | ICD-10-CM | POA: Diagnosis not present

## 2017-02-19 DIAGNOSIS — Z8249 Family history of ischemic heart disease and other diseases of the circulatory system: Secondary | ICD-10-CM

## 2017-02-19 LAB — I-STAT ARTERIAL BLOOD GAS, ED
ACID-BASE EXCESS: 14 mmol/L — AB (ref 0.0–2.0)
Acid-Base Excess: 16 mmol/L — ABNORMAL HIGH (ref 0.0–2.0)
BICARBONATE: 43.2 mmol/L — AB (ref 20.0–28.0)
Bicarbonate: 44.4 mmol/L — ABNORMAL HIGH (ref 20.0–28.0)
O2 SAT: 90 %
O2 Saturation: 94 %
PH ART: 7.362 (ref 7.350–7.450)
PO2 ART: 63 mmHg — AB (ref 83.0–108.0)
Patient temperature: 98.1
TCO2: 47 mmol/L — AB (ref 22–32)
TCO2: 46 mmol/L — AB (ref 22–32)
pCO2 arterial: 76.4 mmHg (ref 32.0–48.0)
pCO2 arterial: 76 mmHg (ref 32.0–48.0)
pH, Arterial: 7.371 (ref 7.350–7.450)
pO2, Arterial: 77 mmHg — ABNORMAL LOW (ref 83.0–108.0)

## 2017-02-19 LAB — CBC WITH DIFFERENTIAL/PLATELET
BASOS ABS: 0.1 10*3/uL (ref 0.0–0.1)
BASOS PCT: 1 %
EOS ABS: 0.3 10*3/uL (ref 0.0–0.7)
EOS PCT: 2 %
HCT: 36.3 % (ref 36.0–46.0)
HEMOGLOBIN: 10.5 g/dL — AB (ref 12.0–15.0)
LYMPHS ABS: 1.3 10*3/uL (ref 0.7–4.0)
Lymphocytes Relative: 12 %
MCH: 26.4 pg (ref 26.0–34.0)
MCHC: 28.9 g/dL — ABNORMAL LOW (ref 30.0–36.0)
MCV: 91.4 fL (ref 78.0–100.0)
Monocytes Absolute: 0.6 10*3/uL (ref 0.1–1.0)
Monocytes Relative: 5 %
NEUTROS PCT: 80 %
Neutro Abs: 8.8 10*3/uL — ABNORMAL HIGH (ref 1.7–7.7)
PLATELETS: 252 10*3/uL (ref 150–400)
RBC: 3.97 MIL/uL (ref 3.87–5.11)
RDW: 20.9 % — ABNORMAL HIGH (ref 11.5–15.5)
WBC: 11 10*3/uL — AB (ref 4.0–10.5)

## 2017-02-19 LAB — PROTIME-INR
INR: 1.02
PROTHROMBIN TIME: 13.3 s (ref 11.4–15.2)

## 2017-02-19 LAB — COMPREHENSIVE METABOLIC PANEL
ALBUMIN: 2.6 g/dL — AB (ref 3.5–5.0)
ALK PHOS: 89 U/L (ref 38–126)
ALT: 19 U/L (ref 14–54)
ANION GAP: 10 (ref 5–15)
AST: 42 U/L — AB (ref 15–41)
BUN: 15 mg/dL (ref 6–20)
CALCIUM: 9.1 mg/dL (ref 8.9–10.3)
CHLORIDE: 89 mmol/L — AB (ref 101–111)
CO2: 39 mmol/L — ABNORMAL HIGH (ref 22–32)
CREATININE: 0.99 mg/dL (ref 0.44–1.00)
GFR calc Af Amer: >60 mL/min (ref >60)
GFR calc non Af Amer: >60 mL/min (ref >60)
Glucose, Bld: 285 mg/dL — ABNORMAL HIGH (ref 65–99)
Potassium: 4 mmol/L (ref 3.5–5.1)
SODIUM: 138 mmol/L (ref 135–145)
TOTAL PROTEIN: 7.1 g/dL (ref 6.5–8.1)
Total Bilirubin: 0.7 mg/dL (ref 0.3–1.2)

## 2017-02-19 LAB — I-STAT VENOUS BLOOD GAS, ED
Acid-Base Excess: 19 mmol/L — ABNORMAL HIGH (ref 0.0–2.0)
Bicarbonate: 45 mmol/L — ABNORMAL HIGH (ref 20.0–28.0)
O2 SAT: 89 %
PCO2 VEN: 57.6 mmHg (ref 44.0–60.0)
PH VEN: 7.501 — AB (ref 7.250–7.430)
PO2 VEN: 53 mmHg — AB (ref 32.0–45.0)
TCO2: 47 mmol/L — ABNORMAL HIGH (ref 22–32)

## 2017-02-19 LAB — BASIC METABOLIC PANEL
Anion gap: 11 (ref 5–15)
BUN: 14 mg/dL (ref 6–20)
CALCIUM: 8.7 mg/dL — AB (ref 8.9–10.3)
CHLORIDE: 88 mmol/L — AB (ref 101–111)
CO2: 38 mmol/L — ABNORMAL HIGH (ref 22–32)
CREATININE: 0.83 mg/dL (ref 0.44–1.00)
Glucose, Bld: 431 mg/dL — ABNORMAL HIGH (ref 65–99)
Potassium: 3.7 mmol/L (ref 3.5–5.1)
SODIUM: 137 mmol/L (ref 135–145)

## 2017-02-19 LAB — BRAIN NATRIURETIC PEPTIDE: B NATRIURETIC PEPTIDE 5: 113.5 pg/mL — AB (ref 0.0–100.0)

## 2017-02-19 LAB — URINALYSIS, ROUTINE W REFLEX MICROSCOPIC
BACTERIA UA: NONE SEEN
Bilirubin Urine: NEGATIVE
Glucose, UA: NEGATIVE mg/dL
Ketones, ur: NEGATIVE mg/dL
NITRITE: NEGATIVE
PROTEIN: NEGATIVE mg/dL
SPECIFIC GRAVITY, URINE: 1.006 (ref 1.005–1.030)
pH: 6 (ref 5.0–8.0)

## 2017-02-19 LAB — I-STAT TROPONIN, ED: TROPONIN I, POC: 0 ng/mL (ref 0.00–0.08)

## 2017-02-19 LAB — LACTIC ACID, PLASMA
LACTIC ACID, VENOUS: 3 mmol/L — AB (ref 0.5–1.9)
LACTIC ACID, VENOUS: 2.3 mmol/L — AB (ref 0.5–1.9)
Lactic Acid, Venous: 2.5 mmol/L (ref 0.5–1.9)

## 2017-02-19 LAB — HEPARIN LEVEL (UNFRACTIONATED)

## 2017-02-19 LAB — I-STAT CG4 LACTIC ACID, ED: Lactic Acid, Venous: 2.07 mmol/L (ref 0.5–1.9)

## 2017-02-19 LAB — GLUCOSE, CAPILLARY: GLUCOSE-CAPILLARY: 452 mg/dL — AB (ref 65–99)

## 2017-02-19 MED ORDER — INSULIN ASPART 100 UNIT/ML ~~LOC~~ SOLN: 0.0000 [IU] | Freq: Every day | SUBCUTANEOUS | Status: DC

## 2017-02-19 MED ORDER — FUROSEMIDE 10 MG/ML IJ SOLN
80.0000 mg | Freq: Once | INTRAMUSCULAR | Status: AC
  Administered 2017-02-19: 80 mg via INTRAVENOUS
  Filled 2017-02-19: qty 8

## 2017-02-19 MED ORDER — INSULIN GLARGINE 100 UNIT/ML ~~LOC~~ SOLN
10.0000 [IU] | Freq: Every day | SUBCUTANEOUS | Status: DC
  Filled 2017-02-19: qty 0.1

## 2017-02-19 MED ORDER — GABAPENTIN 300 MG PO CAPS
300.0000 mg | ORAL_CAPSULE | Freq: Three times a day (TID) | ORAL | Status: DC
  Administered 2017-02-19 – 2017-02-21 (×5): 300 mg via ORAL
  Filled 2017-02-19 (×5): qty 1

## 2017-02-19 MED ORDER — FENTANYL CITRATE (PF) 100 MCG/2ML IJ SOLN
50.0000 ug | Freq: Once | INTRAMUSCULAR | Status: AC
  Administered 2017-02-19: 50 ug via INTRAVENOUS
  Filled 2017-02-19: qty 2

## 2017-02-19 MED ORDER — ASPIRIN EC 325 MG PO TBEC
325.0000 mg | DELAYED_RELEASE_TABLET | Freq: Every evening | ORAL | Status: DC
  Administered 2017-02-19: 325 mg via ORAL
  Filled 2017-02-19: qty 1

## 2017-02-19 MED ORDER — DEXTROSE 5 % IV SOLN
1.0000 g | Freq: Three times a day (TID) | INTRAVENOUS | Status: DC
  Filled 2017-02-19: qty 1

## 2017-02-19 MED ORDER — FUROSEMIDE 10 MG/ML IJ SOLN
40.0000 mg | Freq: Two times a day (BID) | INTRAMUSCULAR | Status: DC
  Administered 2017-02-20 – 2017-02-21 (×3): 40 mg via INTRAVENOUS
  Filled 2017-02-19 (×4): qty 4

## 2017-02-19 MED ORDER — PREDNISONE 5 MG PO TABS
15.0000 mg | ORAL_TABLET | Freq: Every day | ORAL | Status: DC
  Administered 2017-02-20 – 2017-02-21 (×2): 15 mg via ORAL
  Filled 2017-02-19 (×2): qty 1

## 2017-02-19 MED ORDER — HEPARIN BOLUS VIA INFUSION
3000.0000 [IU] | Freq: Once | INTRAVENOUS | Status: AC
  Administered 2017-02-19: 3000 [IU] via INTRAVENOUS
  Filled 2017-02-19: qty 3000

## 2017-02-19 MED ORDER — IPRATROPIUM-ALBUTEROL 0.5-2.5 (3) MG/3ML IN SOLN
3.0000 mL | Freq: Once | RESPIRATORY_TRACT | Status: AC
  Administered 2017-02-19: 3 mL via RESPIRATORY_TRACT
  Filled 2017-02-19: qty 3

## 2017-02-19 MED ORDER — INSULIN ASPART 100 UNIT/ML ~~LOC~~ SOLN
0.0000 [IU] | Freq: Three times a day (TID) | SUBCUTANEOUS | Status: DC
  Administered 2017-02-20: 5 [IU] via SUBCUTANEOUS

## 2017-02-19 MED ORDER — DEXTROSE 5 % IV SOLN
2.0000 g | Freq: Once | INTRAVENOUS | Status: AC
  Administered 2017-02-19: 2 g via INTRAVENOUS
  Filled 2017-02-19: qty 2

## 2017-02-19 MED ORDER — INSULIN GLARGINE 100 UNIT/ML ~~LOC~~ SOLN
5.0000 [IU] | Freq: Every day | SUBCUTANEOUS | Status: DC
  Administered 2017-02-19: 5 [IU] via SUBCUTANEOUS
  Filled 2017-02-19: qty 0.05

## 2017-02-19 MED ORDER — HEPARIN (PORCINE) IN NACL 100-0.45 UNIT/ML-% IJ SOLN
2100.0000 [IU]/h | INTRAMUSCULAR | Status: DC
  Administered 2017-02-19: 1750 [IU]/h via INTRAVENOUS
  Administered 2017-02-19: 1450 [IU]/h via INTRAVENOUS
  Filled 2017-02-19 (×3): qty 250

## 2017-02-19 MED ORDER — FENTANYL CITRATE (PF) 100 MCG/2ML IJ SOLN
25.0000 ug | Freq: Once | INTRAMUSCULAR | Status: AC
  Administered 2017-02-19: 25 ug via INTRAVENOUS
  Filled 2017-02-19: qty 2

## 2017-02-19 MED ORDER — HEPARIN BOLUS VIA INFUSION
4500.0000 [IU] | Freq: Once | INTRAVENOUS | Status: AC
  Administered 2017-02-19: 4500 [IU] via INTRAVENOUS
  Filled 2017-02-19: qty 4500

## 2017-02-19 MED ORDER — METHOCARBAMOL 500 MG PO TABS
500.0000 mg | ORAL_TABLET | Freq: Four times a day (QID) | ORAL | Status: DC | PRN
  Administered 2017-02-19 (×2): 500 mg via ORAL
  Filled 2017-02-19 (×2): qty 1

## 2017-02-19 MED ORDER — INSULIN ASPART 100 UNIT/ML ~~LOC~~ SOLN
5.0000 [IU] | Freq: Once | SUBCUTANEOUS | Status: AC
  Administered 2017-02-19: 5 [IU] via SUBCUTANEOUS

## 2017-02-19 MED ORDER — TRAMADOL HCL 50 MG PO TABS
50.0000 mg | ORAL_TABLET | Freq: Three times a day (TID) | ORAL | Status: DC | PRN
  Administered 2017-02-19 – 2017-02-21 (×5): 50 mg via ORAL
  Filled 2017-02-19 (×5): qty 1

## 2017-02-19 MED ORDER — NITROFURANTOIN MACROCRYSTAL 50 MG PO CAPS
50.0000 mg | ORAL_CAPSULE | Freq: Two times a day (BID) | ORAL | Status: DC
  Administered 2017-02-19 – 2017-02-20 (×2): 50 mg via ORAL
  Filled 2017-02-19 (×2): qty 1

## 2017-02-19 MED ORDER — INSULIN GLARGINE 100 UNIT/ML ~~LOC~~ SOLN
5.0000 [IU] | Freq: Once | SUBCUTANEOUS | Status: AC
  Administered 2017-02-20: 5 [IU] via SUBCUTANEOUS
  Filled 2017-02-19: qty 0.05

## 2017-02-19 NOTE — ED Notes (Signed)
Spoke with Admitting MD about patients muscle cramping. He states no new orders for pain due to sedation.

## 2017-02-19 NOTE — ED Notes (Signed)
Pt is on a bariatric bed. Awaiting transport help.

## 2017-02-19 NOTE — Progress Notes (Signed)
Pharmacy Antibiotic Note  Barbara [name] is a 61 y.o. female admitted on 02/19/2017 with pneumonia.  Pharmacy has been consulted for cefepime dosing. Tmax/24h 105, WBC 11, LA 2.07. SCr 0.99 on admit, CrCl~85-90.  1x dose of cefepime 2g IV ordered in the ED.  Plan: Cefepime 2g IV x 1; then 1g IV q8h Monitor clinical progress, c/s, renal function F/u de-escalation plan/LOT   Height: 5\' 3"  (160 cm) Weight: (!) 323 lb (146.5 kg) IBW/kg (Calculated) : 52.4  No data recorded.   Recent Labs Lab 02/19/17 0847 02/19/17 0903  WBC 11.0*  --   CREATININE 0.99  --   LATICACIDVEN  --  2.07*    Estimated Creatinine Clearance: 85.9 mL/min (by C-G formula based on SCr of 0.99 mg/dL).    Allergies  Allergen Reactions  . Metformin And Related Diarrhea and Nausea And Vomiting  . Vancomycin Rash    Significant "red man" like reaction on entire body that occurred several hours after Vancomycin infusion    Barbara [name], PharmD, BCPS Clinical Pharmacist Rx Phone # for today: [phone] After 3:30PM, please call Main Rx: [phone]#[id number] 02/19/2017 9:39 AM

## 2017-02-19 NOTE — H&P (Signed)
Family Medicine Teaching [facility] Admission History and Physical Service Pager: [phone]  Patient name: Barbara [name] Medical record number: [id number] Date of birth: 1955-07-08 Age: 61 y.o. Gender: female  Primary Care Provider: Moses Manners, MD Consultants: none Code Status: DNR  Chief Complaint: respiratory failure  Assessment and Plan: Barbara [name] is a 61 y.o. female presenting with acute hypoxic respiratory failure. PMH is significant for HFpEF with chronic 2L O2, MS, chronic foley, obesity, HTN, DM2.   Acute Hypoxic Respiratory Failure; subacute hypercarbic respiratory failure:  Likely due to acute HF exacerbation, especially with BNP 113 (from 50), CXR with pulmonary edema. Her weight is same from discharge date; difficult to determine dry weight. Last ECHO in 07/2016 with normal EF and G2DD.  PE considered as patient's CTA chest on 10/4 suggested a possible small filling defect within a segmental R LL pulmonary artery branch vessel; however study was degraded by body habitus. Initially was tachycardic to 160s but this has resolved. She does have RF for VTE since she is mainly bedridden. Wells score of 3 (moderate risk). Unable to obtain CTA chest currently due to her respiratory status. ABG without much change transition from Bipap to HFNC. Her ABG shows normal ph with worsened hypercarbia but with appropriate elevation of bicarb; consistent with subacute instead of acute. Unlikely ACS as EKG unchanged and istat trop 0.0. Less likely there is infection in her lungs; afebrile, CXR shows pulmonary edema, WBC only mildly elevated in the setting of chronic steroid use)  - admit to stepdown, attending [name] - HFNC (please keep O2 sat levels between 88-92% to avoid respiratory depression with high O2 levels)  - s/p Lasix 80mg  x 1 in ED - Lasix IV 40mg  BID starting 10/18 - monitor electrolytes; BMP this evening @ 6pm - daily weights - strict I/Os - Trop I x 1 -  ECHO - s/p Cefepime in the ED. Will hold off on further antibiotics for now (other than her chronic Nitrofurantoin) - will follow blood cx and urine cx from the ED - will need CTA chest when respiratory status permits to evaluate for PE. Until then, continue IV Heparin   Lactic Acidosis: in the setting of acute hypoxic respiratory failure. LA 2.07 - trend  DM2: last A1c 7.5 in 09/2016. Lantus 13 units qhs with SSI Novolog - Lantus 5 units qhs  - SSI regular with cbgs ACHS   LE Chronic Venous Stasis/ Stage 2 Pressure Ulcers of the buttock: unna boots changed twice weekly at home.  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 female presenting with acute hypoxic respiratory failure.   Received limited history from patient. Her sister who is at bedside does not live nearby and does not know what has been happening at home. Patient reports of shortness of breath which started a day or two after her last discharge on 10/6 (she was treated for CAP at that time). She denies any fevers at home. Reports of chills yesterday. No nausea, vomiting, abdominal pain. Reports her  lower extremity swelling seems to be a bit better. Home health nurse changes unna boots twice weekly; last done yesterday per patient. Her sister reports that the patient's cousin who knows more about her medical history told her that the patient's cbgs have been difficult to manage for the past few days and she has been wheezing for about 1 week. She  has been taking all of her medications as prescribed (including Lasix). No sick contacts per pt's sister. Reports that she has back pain (chronic, worsened).   Patient arrived by EMS. She had O2 sat of 68% on home [place] of 2L and she seemed drowsy. She was placed on non-rebreather and then Bipap. Her ABG showed chronic hypercarbia (although worsened), with normal pH and hypoxia. Her CXR was consistent with pulmonary edema and her BNP was elevated to 113. She was given Lasix 80mg  IV x 1. She also received Cefepime x 1 due to concern of HCAP since she was recently hospitalized for CAP. Her EKG was unchanged, istat trop was negative. She had lactic acidosis of 2.07.    Review Of Systems: Per HPI   ROS  Patient Active Problem List   Diagnosis Date Noted  . Acute respiratory failure (HCC) 02/19/2017  . Cough   . Generalized weakness   . CHF (congestive heart failure) (HCC) 02/06/2017  . Acute pulmonary edema (HCC)   . Type 2 diabetes mellitus without complication, with long-term current use of insulin (HCC)   . Chronic diastolic heart failure (HCC) 12/16/2016  . Pressure injury of skin 12/14/2016  . Neurogenic bladder 12/10/2016  . Acute on chronic respiratory failure (HCC) 12/09/2016  . Sepsis (HCC)   . Somnolence   . Mechanical complication due to bladder catheter (HCC) 12/08/2016  . Hematuria 12/08/2016  . Bladder catheter infection (HCC) 12/08/2016  . Pneumonia due to organism 12/08/2016  . Adverse reaction to antibiotic - Vancomycin 12/08/2016  . Hypoxia 12/07/2016  . UTI (urinary tract infection) 12/07/2016  . Chronic indwelling Foley catheter 11/29/2016  . Cellulitis, leg 09/24/2016  . Shortness of breath   . Lower extremity weakness 04/02/2016  . Palliative care encounter 03/07/2016  . Current chronic use of systemic steroids 03/07/2016  . DM type 2, not at goal [facility]) 03/06/2016  . Weakness of both lower extremities   . Absence of bladder continence   . Essential hypertension    . Encounter for chronic pain management 04/21/2014  . Chronic venous insufficiency 11/05/2012  . Cervical myelopathy (HCC) 08/07/2011  . Spinal stenosis in cervical region 06/03/2011  . Multiple sclerosis (HCC) 12/07/2010  . 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 Spinal stenosis of lumbar region 12/07/2010  . Morbid obesity (HCC) 12/07/2010  . Chronic gout 12/07/2010    Past Medical History: Past Medical History:  Diagnosis Date  . Absence of bladder continence   . 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 Cellulitis, leg 09/24/2016  . Cervical myelopathy (HCC) 08/07/2011  . CHF (congestive heart failure) (HCC) 02/06/2017  . Chronic gout 12/07/2010   Infrequent flairs   . Chronic indwelling Foley catheter 11/29/2016  . Chronic venous insufficiency 11/05/2012  . Current chronic use of systemic steroids 03/07/2016   No shingles vaccine  . Decubitus ulcer of sacral region   . Diabetes mellitus without complication (HCC)   . DM type 2, not at goal [facility]) 03/06/2016  . Essential hypertension   . Gout   . Hypertension   . Hypoxia   . Impaired mobility and activities of daily living 03/07/2016  .  Lower back pain   . Lower extremity weakness 04/02/2016  . Lumbar spondylosis   . Morbid obesity (HCC) 12/07/2010   Has always been large.  Since MS has been trying and partially successful at wt loss.     . MS (multiple sclerosis) (HCC)   . Multiple sclerosis (HCC) 12/07/2010   Diagnosed 05/2010.  Managed by Dr. Sandria ManlyLove   . Numbness   . Shortness of breath   . Spastic paraplegia   . Spinal stenosis   . Spinal stenosis in cervical region 06/03/2011    [facility] (Dr. Timoteo AceBagley) anterior discectomies and fusion at C5-6,6-7 & 7-1 done on 06/14/11.    Marland Kitchen. Spinal stenosis of lumbar region 12/07/2010   Chronic low back pain.  Left leg weak but unclear if due to MS or spinal stenosis.  Pain well controled by tramadol.    Now (Jan 2013)  Managed  [facility], who plans at a later date lumbar laminectomy L2-4.   Marland Kitchen. Weakness   . Weakness of both  lower extremities     Past Surgical History: Past Surgical History:  Procedure Laterality Date  . BACK SURGERY    . CERVICAL DISC SURGERY     Qusetions C5-6 C6-7 and C7-T1    Social History: Social History  Substance Use Topics  . Smoking status: Never Smoker  . Smokeless tobacco: Never Used  . Alcohol use No   Please also refer to relevant sections of EMR.  Family History: Family History  Problem Relation Age of Onset  . Heart disease Mother   . Diabetes Mother   . Cancer Father     Allergies and Medications: Allergies  Allergen Reactions  . Metformin And Related Diarrhea and Nausea And Vomiting  . Vancomycin Rash    Significant "red man" like reaction on entire body that occurred several hours after Vancomycin infusion   No current facility-administered medications on file prior to encounter.    Current Outpatient Prescriptions on File Prior to Encounter  Medication Sig Dispense Refill  . Apple Cider Vinegar 188 MG CAPS Take 188 mg by mouth daily.     Marland Kitchen. aspirin EC 325 MG tablet Take 325 mg by mouth every evening.     . Cholecalciferol (VITAMIN D3) 1000 units CAPS Take 4,000 Units by mouth every evening.     . Coenzyme Q10 (COQ10) 100 MG CAPS Take 100 mg by mouth every evening.    . diazepam (VALIUM) 5 MG tablet TAKE 1 TABLET BY MOUTH EVERY 6 HOURS AS NEEDED FOR LEG SPASMS 90 tablet 5  . furosemide (LASIX) 20 MG tablet TAKE 1 TABLET BY MOUTH 2 TIMES DAILY. 180 tablet 3  . gabapentin (NEURONTIN) 300 MG capsule Take 1 capsule (300 mg total) by mouth 3 (three) times daily. 90 capsule 0  . ibuprofen (ADVIL,MOTRIN) 400 MG tablet Take 1 tablet (400 mg total) by mouth every 6 (six) hours as needed for headache or moderate pain. 30 tablet 0  . insulin aspart (NOVOLOG) 100 UNIT/ML FlexPen Inject 0-5 Units into the skin every morning.  15 mL 11  . Insulin Glargine (LANTUS) 100 UNIT/ML Solostar Pen Inject 10 Units into the skin daily at 10 pm. (Patient taking differently: Inject  13 Units into the skin at bedtime. ) 15 mL 11  . Magnesium 250 MG TABS Take 250 mg by mouth every evening.     . methocarbamol (ROBAXIN) 500 MG tablet Take 0.5 tablets (250 mg total) by mouth 3 (three) times daily as needed  for muscle spasms. 90 tablet 0  . Multiple Vitamin (MULTIVITAMIN WITH MINERALS) TABS tablet Take 1 tablet by mouth every evening.    . nitrofurantoin (MACRODANTIN) 50 MG capsule Take 1 capsule (50 mg total) by mouth 2 (two) times daily. 60 capsule 12  . predniSONE (DELTASONE) 5 MG tablet Take 3 tablets (15 mg total) by mouth daily with breakfast. 30 tablet 0  . traMADol (ULTRAM) 50 MG tablet Take 1 tablet (50 mg total) by mouth every 8 (eight) hours as needed. (Patient taking differently: Take 50 mg by mouth every 8 (eight) hours as needed for moderate pain. ) 90 tablet 0    Objective: BP 109/60   Pulse 78   Temp 98 F (36.7 C) (Rectal)   Resp 14   Ht 5\' 3"  (1.6 m)   Wt (!) 323 lb (146.5 kg)   LMP 10/07/2010   SpO2 92%   BMI 57.22 kg/m  Exam: General: obese, drowsy, on bipap trying to take it off Eyes: EOMI, PERRL  ENTM: mildy dry mucous membranes Neck: no cervical adenopathy  Cardiovascular: mildly tachycardic, regular rhythm, systolic murmur Respiratory: on bipap, normal effort, limited auscultation due to body habitus, but good airmovement but decreased at the bases bilaterally. No wheezing or crackles noted  Gastrointestinal: soft, NT, ND, + bowel sounds  MSK: able to move upper extremities. Lower extremities are wrapped.  Derm: some blistering noted on the toes, stage 2 pressure ulcers on the left upper thigh and right lower buttock     Neuro: intermittently sleepy but able to answer orientation questions and other questions. Oriented x 4.  Psych: pleasant but drowsy, alert during discussion  Labs and Imaging: CBC BMET   Recent Labs Lab 02/19/17 0847  WBC 11.0*  HGB 10.5*  HCT 36.3  PLT 252    Recent Labs Lab 02/19/17 0847  NA 138  K 4.0   CL 89*  CO2 39*  BUN 15  CREATININE 0.99  GLUCOSE 285*  CALCIUM 9.1     BNP 113 istat trop 0.00 EKG: NSR, low voltage, unchanged from prior AHG pH 7.37, CO2 76.4, O2 63.  Lactic Acid: 2.07  CXR:  IMPRESSION: Cardiomegaly with diffuse bilateral airspace disease, increased since prior study, most compatible with moderate CHF.  Palma Holter, MD 02/19/2017, 11:18 AM PGY-3, [facility] FPTS Intern pager: [phone], text pages welcome

## 2017-02-19 NOTE — Progress Notes (Signed)
ANTICOAGULATION CONSULT NOTE  Pharmacy Consult for heparin Indication: pulmonary embolus  Heparin Dosing Weight: 89.8 kg   Assessment: 61 yof with hx of MS with neurogenic bladder presenting with SOB, reportedly with history of PE. CTA from 02/06/2017 - "study degraded by body habitus, cannot exclude small embolus in this region." Not on anticoagulation PTA per med rec. Hg 10.5 stable, plt wnl. No bleed documented.  Goal of Therapy:  Heparin level 0.3-0.7 units/ml Monitor platelets by anticoagulation protocol: Yes   Plan:  Heparin 4500 unit bolus Start heparin at 1450 units/h 6h heparin level Daily heparin level/CBC Monitor s/sx bleeding   Babs BertinHaley Erskine Steinfeldt, PharmD, BCPS Clinical Pharmacist 02/19/2017 10:30 AM

## 2017-02-19 NOTE — ED Notes (Signed)
Admitting provider at bedside for evaluation.

## 2017-02-19 NOTE — Progress Notes (Signed)
ANTICOAGULATION CONSULT NOTE  Pharmacy Consult for heparin Indication: pulmonary embolus  Heparin Dosing Weight: 89.8 kg   Assessment: 61 yof with hx of MS with neurogenic bladder presenting with SOB, reportedly with history of PE. CTA from 02/06/2017 - "study degraded by body habitus, cannot exclude small embolus in this region." Not on anticoagulation PTA per med rec. Hg 10.5 stable, plt wnl. No bleed documented.  Heparin level < 0.10  Goal of Therapy:  Heparin level 0.3-0.7 units/ml Monitor platelets by anticoagulation protocol: Yes   Plan:  Heparin 3000 unit bolus Increase heparin drip to 1750 units / hr  Daily heparin level/CBC Monitor s/sx bleeding  Thank you Okey Regal, PharmD [phone] 02/19/2017 9:15 PM

## 2017-02-19 NOTE — Progress Notes (Signed)
RT stuck patient x2 for ABG both attempts unsuccessful. Patient states "please do not stick me again I know this is the end for me and I am fine with that". Patient sister at bedside and stated "my sister has been saying that for a while and told the MD earlier today that she did not want anymore care that she knew it was her time to die". Sister was very upset when telling me that. RT paged MD and is waiting for further medical advice, and or instructions.

## 2017-02-19 NOTE — ED Provider Notes (Signed)
Emergency Department Provider Note   I have reviewed the triage vital signs and the nursing notes.   HISTORY  Chief Complaint Shortness of Breath   HPI Barbara [name] is a 61 y.o. female with PMH of MS with neurogenic bladder and chronic indwelling foley catheter, recent admission for PNA, CHF, DM, HTN, obesity, and spastic paraplegia resents to the emergency department for evaluation of difficulty breathing. Patient is on 2 L nasal cannula at home and began having shortness of breath this morning. She is found to be hypoxemic to the 70s by EMS. She was given 125 mg of Solu-Medrol and 3 nebulizer treatments in route. Patient family member at bedside states that she's had the chronic indwelling Foley since February. She's had recent admissions for pneumonia and also has history of PE by report. No fevers or chills. Patient with chronic wounds to the lower extremities.   Level 5 caveat: Somnolence and respiratory distress.    Past Medical History:  Diagnosis Date  . Absence of bladder continence   . 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 Cellulitis, leg 09/24/2016  . Cervical myelopathy (HCC) 08/07/2011  . CHF (congestive heart failure) (HCC) 02/06/2017  . Chronic gout 12/07/2010   Infrequent flairs   . Chronic indwelling Foley catheter 11/29/2016  . Chronic venous insufficiency 11/05/2012  . Current chronic use of systemic steroids 03/07/2016   No shingles vaccine  . Decubitus ulcer of sacral region   . Diabetes mellitus without complication (HCC)   . DM type 2, not at goal [facility]) 03/06/2016  . Essential hypertension   . Gout   . Hypertension   . Hypoxia   . Impaired mobility and activities of daily living 03/07/2016  . Lower back pain   . Lower extremity weakness 04/02/2016  . Lumbar spondylosis   . Morbid obesity (HCC) 12/07/2010   Has always been large.  Since MS has been trying and partially successful at wt loss.     . MS (multiple sclerosis) (HCC)   . Multiple sclerosis (HCC) 12/07/2010     Diagnosed 05/2010.  Managed by Dr. Sandria Manly   . Numbness   . Shortness of breath   . Spastic paraplegia   . Spinal stenosis   . Spinal stenosis in cervical region 06/03/2011    [facility] (Dr. Timoteo Ace) anterior discectomies and fusion at C5-6,6-7 & 7-1 done on 06/14/11.    Marland Kitchen Spinal stenosis of lumbar region 12/07/2010   Chronic low back pain.  Left leg weak but unclear if due to MS or spinal stenosis.  Pain well controled by tramadol.    Now (Jan 2013)  Managed  [facility], who plans at a later date lumbar laminectomy L2-4.   Marland Kitchen Weakness   . Weakness of both lower extremities     Patient Active Problem List   Diagnosis Date Noted  . Acute respiratory failure (HCC) 02/19/2017  . Lactic acidosis   . Cough   . Generalized weakness   . CHF (congestive heart failure) (HCC) 02/06/2017  . Acute pulmonary edema (HCC)   . Type 2 diabetes mellitus with complication, without Maison Kestenbaum-term current use of insulin (HCC)   . Chronic diastolic heart failure (HCC) 12/16/2016  . Pressure injury of skin 12/14/2016  . Neurogenic bladder 12/10/2016  . Acute on chronic respiratory failure (HCC) 12/09/2016  . Sepsis (HCC)   . Somnolence   . Mechanical complication due to bladder catheter (HCC) 12/08/2016  . Hematuria 12/08/2016  . Bladder catheter infection (HCC) 12/08/2016  .  Pneumonia due to organism 12/08/2016  . Adverse reaction to antibiotic - Vancomycin 12/08/2016  . Hypoxia 12/07/2016  . UTI (urinary tract infection) 12/07/2016  . Chronic indwelling Foley catheter 11/29/2016  . Cellulitis, leg 09/24/2016  . Shortness of breath   . Lower extremity weakness 04/02/2016  . Palliative care encounter 03/07/2016  . Current chronic use of systemic steroids 03/07/2016  . DM type 2, not at goal [facility]) 03/06/2016  . Weakness of both lower extremities   . Absence of bladder continence   . Essential hypertension   . Encounter for chronic pain management 04/21/2014  . Chronic venous insufficiency 11/05/2012  . Cervical  myelopathy (HCC) 08/07/2011  . Spinal stenosis in cervical region 06/03/2011  . Multiple sclerosis (HCC) 12/07/2010  . 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 Spinal stenosis of lumbar region 12/07/2010  . Morbid obesity (HCC) 12/07/2010  . Chronic gout 12/07/2010    Past Surgical History:  Procedure Laterality Date  . BACK SURGERY    . CERVICAL DISC SURGERY     Qusetions C5-6 C6-7 and C7-T1      Allergies Metformin and related and Vancomycin  Family History  Problem Relation Age of Onset  . Heart disease Mother   . Diabetes Mother   . Cancer Father     Social History Social History  Substance Use Topics  . Smoking status: Never Smoker  . Smokeless tobacco: Never Used  . Alcohol use No    Review of Systems  Level 5 caveat: AMS  ____________________________________________   PHYSICAL EXAM:  VITAL SIGNS: ED Triage Vitals  Enc Vitals Group     BP 02/19/17 0830 114/66     Pulse Rate 02/19/17 0830 (!) 166     Resp 02/19/17 0830 18     Temp --      Temp src --      SpO2 02/19/17 0830 95 %     Weight 02/19/17 0825 (!) 323 lb (146.5 kg)     Height 02/19/17 0825 5\' 3"  (1.6 m)     Pain Score 02/19/17 0823 7   Constitutional: Somnolent and confused. Appears acutely ill.  Eyes: Conjunctivae are normal.  Head: Atraumatic. Nose: No congestion/rhinnorhea. Mouth/Throat: Mucous membranes are dry.  Neck: No stridor.   Cardiovascular: Tachycardia. Good peripheral circulation. Grossly normal heart sounds.   Respiratory: Increased respiratory effort.  No retractions. Lungs with bilateral crackles at the bases and occasional end-expiratory wheezing.  Gastrointestinal: Soft and nontender. No distention. Chronic indwelling foley in place.  Musculoskeletal: No lower extremity tenderness nor edema. No gross deformities of extremities. Neurologic:  Normal speech and language. No gross focal neurologic deficits are appreciated.  Skin:  Skin is warm, dry and intact. No rash  noted.   ____________________________________________   LABS (all labs ordered are listed, but only abnormal results are displayed)  Labs Reviewed  COMPREHENSIVE METABOLIC PANEL - Abnormal; Notable for the following:       Result Value   Chloride 89 (*)    CO2 39 (*)    Glucose, Bld 285 (*)    Albumin 2.6 (*)    AST 42 (*)    All other components within normal limits  CBC WITH DIFFERENTIAL/PLATELET - Abnormal; Notable for the following:    WBC 11.0 (*)    Hemoglobin 10.5 (*)    MCHC 28.9 (*)    RDW 20.9 (*)    Neutro Abs 8.8 (*)    All other components within normal limits  BRAIN NATRIURETIC PEPTIDE -  Abnormal; Notable for the following:    B Natriuretic Peptide 113.5 (*)    All other components within normal limits  HEPARIN LEVEL (UNFRACTIONATED) - Abnormal; Notable for the following:    Heparin Unfractionated <0.10 (*)    All other components within normal limits  LACTIC ACID, PLASMA - Abnormal; Notable for the following:    Lactic Acid, Venous 3.0 (*)    All other components within normal limits  URINALYSIS, ROUTINE W REFLEX MICROSCOPIC - Abnormal; Notable for the following:    Color, Urine STRAW (*)    Hgb urine dipstick SMALL (*)    Leukocytes, UA SMALL (*)    Squamous Epithelial / LPF 0-5 (*)    All other components within normal limits  HEPARIN LEVEL (UNFRACTIONATED) - Abnormal; Notable for the following:    Heparin Unfractionated <0.10 (*)    All other components within normal limits  CBC - Abnormal; Notable for the following:    WBC 10.6 (*)    RBC 3.54 (*)    Hemoglobin 9.4 (*)    HCT 32.0 (*)    MCHC 29.4 (*)    RDW 19.7 (*)    All other components within normal limits  LACTIC ACID, PLASMA - Abnormal; Notable for the following:    Lactic Acid, Venous 2.3 (*)    All other components within normal limits  LACTIC ACID, PLASMA - Abnormal; Notable for the following:    Lactic Acid, Venous 2.5 (*)    All other components within normal limits  COMPREHENSIVE  METABOLIC PANEL - Abnormal; Notable for the following:    Potassium 3.3 (*)    Chloride 86 (*)    CO2 40 (*)    Glucose, Bld 330 (*)    Calcium 8.8 (*)    Albumin 2.4 (*)    All other components within normal limits  BASIC METABOLIC PANEL - Abnormal; Notable for the following:    Chloride 88 (*)    CO2 38 (*)    Glucose, Bld 431 (*)    Calcium 8.7 (*)    All other components within normal limits  GLUCOSE, CAPILLARY - Abnormal; Notable for the following:    Glucose-Capillary 452 (*)    All other components within normal limits  GLUCOSE, CAPILLARY - Abnormal; Notable for the following:    Glucose-Capillary 213 (*)    All other components within normal limits  I-STAT CG4 LACTIC ACID, ED - Abnormal; Notable for the following:    Lactic Acid, Venous 2.07 (*)    All other components within normal limits  I-STAT ARTERIAL BLOOD GAS, ED - Abnormal; Notable for the following:    pCO2 arterial 76.4 (*)    pO2, Arterial 63.0 (*)    Bicarbonate 44.4 (*)    TCO2 47 (*)    Acid-Base Excess 16.0 (*)    All other components within normal limits  I-STAT ARTERIAL BLOOD GAS, ED - Abnormal; Notable for the following:    pCO2 arterial 76.0 (*)    pO2, Arterial 77.0 (*)    Bicarbonate 43.2 (*)    TCO2 46 (*)    Acid-Base Excess 14.0 (*)    All other components within normal limits  I-STAT VENOUS BLOOD GAS, ED - Abnormal; Notable for the following:    pH, Ven 7.501 (*)    pO2, Ven 53.0 (*)    Bicarbonate 45.0 (*)    TCO2 47 (*)    Acid-Base Excess 19.0 (*)    All other components within normal limits  MRSA PCR SCREENING  URINE CULTURE  CULTURE, BLOOD (ROUTINE X 2)  CULTURE, BLOOD (ROUTINE X 2)  URINE CULTURE  PROTIME-INR  BLOOD GAS, VENOUS  HEPARIN LEVEL (UNFRACTIONATED)  I-STAT TROPONIN, ED   ____________________________________________  EKG   EKG Interpretation  Date/Time:  Wednesday February 19 2017 08:36:27 EDT Ventricular Rate:  101 PR Interval:    QRS Duration: 93 QT  Interval:  347 QTC Calculation: 450 R Axis:   77 Text Interpretation:  Sinus tachycardia Atrial premature complex Low voltage, extremity and precordial leads No STEMI.  Confirmed by Alona Bene [phone]) on 02/19/2017 8:54:39 AM       ____________________________________________  RADIOLOGY  Dg Chest Port 1 View  Result Date: 02/19/2017 CLINICAL DATA:  Shortness of Breath EXAM: PORTABLE CHEST 1 VIEW COMPARISON:  02/06/2017 FINDINGS: Cardiomegaly. Diffuse bilateral airspace opacities, likely edema/ CHF. No visible layering effusion scratched at no visible effusion or acute bony abnormality. IMPRESSION: Cardiomegaly with diffuse bilateral airspace disease, increased since prior study, most compatible with moderate CHF. Electronically Signed   By: Charlett Nose M.D.   On: 02/19/2017 09:08    ____________________________________________   PROCEDURES  Procedure(s) performed:   Procedures  CRITICAL CARE Performed by: Maia Plan Total critical care time: 70 minutes Critical care time was exclusive of separately billable procedures and treating other patients. Critical care was necessary to treat or prevent imminent or life-threatening deterioration. Critical care was time spent personally by me on the following activities: development of treatment plan with patient and/or surrogate as well as nursing, discussions with consultants, evaluation of patient's response to treatment, examination of patient, obtaining history from patient or surrogate, ordering and performing treatments and interventions, ordering and review of laboratory studies, ordering and review of radiographic studies, pulse oximetry and re-evaluation of patient's condition.  Alona Bene, MD Emergency Medicine  ____________________________________________   INITIAL IMPRESSION / ASSESSMENT AND PLAN / ED COURSE  Pertinent labs & imaging results that were available during my care of the patient were reviewed by me and  considered in my medical decision making (see chart for details).  Patient is hypoxemic and tachycardic on arrival. She has history of COPD with poor air entry on exam with scattered wheezing. Went to continue duo neb. Patient is able to provide some history. Believe she is awake and alert enough to start BiPAP along with additional nebulizer treatment. Will obtain stat portable chest x-ray. EKG with no acute ischemia.   10:00 AM Interfacing well with BiPAP but remains confused and pulling off the mask at times. Able to be re-directed but requires this frequently. Complaining of back pain (chronic). Will give Fentanyl for pain and reassess. No vomiting. Confirmed with family at bedside that the patient would NOT want intubation if respiratory status deteriorated.   Patient transitioned to HFNC with frequent removal of the BiPAP. Clinically seems to be improving. Becoming more alert but is still somewhat confused.   Discussed patient's case with IM teaching team to request admission. Patient and family (if present) updated with plan. Care transferred to IM service.  I reviewed all nursing notes, vitals, pertinent old records, EKGs, labs, imaging (as available).  ____________________________________________  FINAL CLINICAL IMPRESSION(S) / ED DIAGNOSES  Final diagnoses:  Acute on chronic respiratory failure with hypoxia     MEDICATIONS GIVEN DURING THIS VISIT:  Medications  heparin ADULT infusion 100 units/mL (25000 units/260mL sodium chloride 0.45%) (2,100 Units/hr Intravenous Rate/Dose Change 02/20/17 0618)  aspirin EC tablet 325 mg (325 mg Oral Given 02/19/17 2239)  predniSONE (DELTASONE) tablet 15 mg (not administered)  nitrofurantoin (MACRODANTIN) capsule 50 mg (50 mg Oral Given 02/19/17 2239)  insulin aspart (novoLOG) injection 0-15 Units (not administered)  insulin aspart (novoLOG) injection 0-5 Units (0 Units Subcutaneous Not Given 02/19/17 2315)  furosemide (LASIX) injection 40  mg (not administered)  gabapentin (NEURONTIN) capsule 300 mg (300 mg Oral Not Given 02/19/17 2301)  traMADol (ULTRAM) tablet 50 mg (50 mg Oral Given 02/19/17 2358)  methocarbamol (ROBAXIN) tablet 500 mg (500 mg Oral Given 02/19/17 2323)  insulin glargine (LANTUS) injection 10 Units (not administered)  chlorhexidine (PERIDEX) 0.12 % solution 15 mL (not administered)  MEDLINE mouth rinse (not administered)  potassium chloride SA (K-DUR,KLOR-CON) CR tablet 40 mEq (not administered)  ipratropium-albuterol (DUONEB) 0.5-2.5 (3) MG/3ML nebulizer solution 3 mL (3 mLs Nebulization Given 02/19/17 0913)  furosemide (LASIX) injection 80 mg (80 mg Intravenous Given 02/19/17 0949)  ceFEPIme (MAXIPIME) 2 g in dextrose 5 % 50 mL IVPB (0 g Intravenous Stopped 02/19/17 1028)  fentaNYL (SUBLIMAZE) injection 50 mcg (50 mcg Intravenous Given 02/19/17 0944)  heparin bolus via infusion 4,500 Units (4,500 Units Intravenous Bolus from Bag 02/19/17 1106)  fentaNYL (SUBLIMAZE) injection 25 mcg (25 mcg Intravenous Given 02/19/17 1142)  heparin bolus via infusion 3,000 Units (3,000 Units Intravenous Bolus from Bag 02/19/17 2143)  insulin aspart (novoLOG) injection 5 Units (5 Units Subcutaneous Given 02/19/17 2323)  insulin glargine (LANTUS) injection 5 Units (5 Units Subcutaneous Given 02/20/17 0026)  heparin bolus via infusion 3,000 Units (3,000 Units Intravenous Bolus from Bag 02/20/17 0617)     NEW OUTPATIENT MEDICATIONS STARTED DURING THIS VISIT:  None  Note:  This document was prepared using Dragon voice recognition software and may include unintentional dictation errors.  Alona BeneJoshua Jayvien Rowlette, MD Emergency Medicine    Latia Mataya, Arlyss RepressJoshua G, MD 02/20/17 [phone]

## 2017-02-19 NOTE — ED Triage Notes (Signed)
Pt arrives via EMS from home with sudden onset of SOB. Just discharged Sunday from hospital with COPD. Arrives, tachypneic, normally wears 2L [place]. Sats 68% on arrival 2L. Received 125mg  solumedrol, 3 neb tx with little improvement in symptoms. Pt lethargic, oriented x4, indwelling urine catheter. Feels hot to touch. Inspiratory and expirqatory wheezes.

## 2017-02-19 NOTE — Consult Note (Addendum)
WOC Nurse wound consult note Reason for Consult: Consult requested for buttocks and bilat legs.  Pt is familiar to [facility] team from previous admissions.  She wears Una boots prior to admission which were just applied yesterday, but are wet at this time. Wound type: Assessed buttocks when turning to use the bedpan.  Right buttocks and sacrum do not have wounds.  Left buttocks have a red, macerated partial thickness wound, 5X5X.1cm, with shaggy irregular edges.  Appearance is consistent with frequent moisture associated skin damage and shear, NOT a pressure injury. Pt states this is where she gets OOB with the [facility] lift rubbing to the affected area. Removed bilat Una boots, since they were wet, to assess legs. Left inner foot with full thickness wound; 2X2X.2cm, yellow moist wound bed, small amt light green drainage, no odor Left anterior foot with partial thickness abrasion, .2X.2X.1cm, small amt bloody drainage.   Left great toe with dark red deep tissue injury from previous Una boots which were removed. .2X.2cm. Right leg with multiple clear fluid filled blisters which have ruptured and evolved into partial thickness wounds across anterior and posterior calf; red wound wound bed, draining a large amt yellow fluid, no odor. Pt has generalized edema all over; abd and bilat thighs are developing red stage 1 pressure injuries from rubbing together.  Pt is in the ER awaiting a low airloss mattress.  Attempted to move her onto one in the hallway which had previously been ordered, but the blower was not functioning and portable equipment is planning to bring a replacement bed as soon as possible to decrease pressure.    Plan:  Foam dressing to left buttock to protect and promote healing.  Leave Una boots off and have bedside nurses apply xeroform, abd pads, kerlex and ace wrap Q day to absorb drainage and allow more frequent assessment and provide light compression until blistered area resolve. Discussed plan of  care with patient and she verbalized understanding. Please re-consult if further assistance is needed.  Thank-you,  Cammie Mcgee MSN, RN, CWOCN, [place], CNS [phone]

## 2017-02-19 NOTE — ED Notes (Signed)
Lab results given to MD.

## 2017-02-19 NOTE — Progress Notes (Signed)
FPTS Interim Progress Note Went to assess patient after report from her RN about elevated lactic acid to 3.0. On arrival, patient lying in bed with HFNC in place. She speaks in full sentence but intermittently falls asleep as she talks. She complains about pain in her back and legs. She denies chest pain. She says she would like to go home. She also doesn't want to be stuck anymore for aterial blood gas. She says she remembers her discussion with PCP this afternoon but not able to reiterate. When asked to tell me what she remembers, she states having three brothers and one sister. Then she states having one brother and three sisters. She states on [place]   O: BP 134/78   Pulse 95   Temp 98 F (36.7 C) (Rectal)   Resp 18   Ht 5\' 3"  (1.6 m)   Wt (!) 323 lb (146.5 kg)   LMP 10/07/2010   SpO2 90%   BMI 57.22 kg/m   GEN: morbidly obese, HFNC in place, intermittently sleepy but arises easily CVS: RRR, nl s1 & s2 RESP: HFNC in place, 6L, SpO2 92%, no IWOB. Further exam limited due to body habitus.   A/P: Hypercarbic/hypoxic respiratory failure: Now with lactic acidosis to 3.0 likely due to hypoxia/respiratory failure. Intermittently sleepy but arises easily. She declines ABG. At this time, it is unclear how aggressive we should go given her interest in comfort care. However, I am not sure if she has a capacity to make a clear decision due to her mental status in the setting of respiratory failure.  -We will obtain a VBG.  -Repeat lactic acid -We'll attempt BiPAP -We may involve CCM as well  Almon HerculesGonfa, Naelani Lafrance T, MD 02/19/2017, 6:30 PM PGY-3, [facility] pager [phone]

## 2017-02-20 ENCOUNTER — Inpatient Hospital Stay (HOSPITAL_COMMUNITY): Payer: PPO

## 2017-02-20 DIAGNOSIS — J9601 Acute respiratory failure with hypoxia: Secondary | ICD-10-CM

## 2017-02-20 DIAGNOSIS — I509 Heart failure, unspecified: Secondary | ICD-10-CM

## 2017-02-20 DIAGNOSIS — R0602 Shortness of breath: Secondary | ICD-10-CM

## 2017-02-20 DIAGNOSIS — J9602 Acute respiratory failure with hypercapnia: Secondary | ICD-10-CM

## 2017-02-20 DIAGNOSIS — G35 Multiple sclerosis: Secondary | ICD-10-CM

## 2017-02-20 LAB — COMPREHENSIVE METABOLIC PANEL
ALBUMIN: 2.4 g/dL — AB (ref 3.5–5.0)
ALT: 17 U/L (ref 14–54)
AST: 36 U/L (ref 15–41)
Alkaline Phosphatase: 73 U/L (ref 38–126)
Anion gap: 12 (ref 5–15)
BUN: 15 mg/dL (ref 6–20)
CHLORIDE: 86 mmol/L — AB (ref 101–111)
CO2: 40 mmol/L — AB (ref 22–32)
CREATININE: 0.83 mg/dL (ref 0.44–1.00)
Calcium: 8.8 mg/dL — ABNORMAL LOW (ref 8.9–10.3)
GFR calc Af Amer: >60 mL/min (ref >60)
GFR calc non Af Amer: >60 mL/min (ref >60)
Glucose, Bld: 330 mg/dL — ABNORMAL HIGH (ref 65–99)
POTASSIUM: 3.3 mmol/L — AB (ref 3.5–5.1)
SODIUM: 138 mmol/L (ref 135–145)
Total Bilirubin: 0.5 mg/dL (ref 0.3–1.2)
Total Protein: 6.6 g/dL (ref 6.5–8.1)

## 2017-02-20 LAB — ECHOCARDIOGRAM COMPLETE
AOASC: 37 cm
AOVTI: 68.8 cm
AV Area VTI index: 0.37 cm2/m2
AV Area VTI: 0.98 cm2
AV Area mean vel: 0.92 cm2
AV Mean grad: 25 mmHg
AV Peak grad: 41 mmHg
AV area mean vel ind: 0.35 cm2/m2
AV peak Index: 0.37
AVCELMEANRAT: 0.32
AVPKVEL: 319 cm/s
Ao pk vel: 0.34 m/s
CHL CUP AV VEL: 0.98
CHL CUP MV DEC (S): 211
CHL CUP PV REG GRAD DIAS: 9 mmHg
CHL CUP REG VEL DIAS: 151 cm/s
DOP CAL AO MEAN VELOCITY: 230 cm/s
EERAT: 10.95
EWDT: 211 ms
FS: 25 % — AB (ref 28–44)
Height: 63 in
IV/PV OW: 1.02
LA diam end sys: 40 mm
LA vol A4C: 42.3 ml
LADIAMINDEX: 1.51 cm/m2
LASIZE: 40 mm
LDCA: 2.84 cm2
LV E/e' medial: 10.95
LV E/e'average: 10.95
LV PW d: 10.4 mm — AB (ref 0.6–1.1)
LV e' LATERAL: 12.6 cm/s
LVOT SV: 68 mL
LVOT VTI: 23.8 cm
LVOT diameter: 19 mm
LVOT peak VTI: 0.35 cm
LVOTPV: 110 cm/s
MV Peak grad: 8 mmHg
MVPKAVEL: 109 m/s
MVPKEVEL: 138 m/s
RV LATERAL S' VELOCITY: 12.5 cm/s
RV TAPSE: 22.6 mm
TDI e' lateral: 12.6
TDI e' medial: 9.46
Valve area index: 0.37
Valve area: 0.98 cm2
WEIGHTICAEL: 5168 [oz_av]

## 2017-02-20 LAB — HEPARIN LEVEL (UNFRACTIONATED): Heparin Unfractionated: 0.31 IU/mL (ref 0.30–0.70)

## 2017-02-20 LAB — CBC
HCT: 32 % — ABNORMAL LOW (ref 36.0–46.0)
Hemoglobin: 9.4 g/dL — ABNORMAL LOW (ref 12.0–15.0)
MCH: 26.6 pg (ref 26.0–34.0)
MCHC: 29.4 g/dL — ABNORMAL LOW (ref 30.0–36.0)
MCV: 90.4 fL (ref 78.0–100.0)
PLATELETS: 258 10*3/uL (ref 150–400)
RBC: 3.54 MIL/uL — AB (ref 3.87–5.11)
RDW: 19.7 % — AB (ref 11.5–15.5)
WBC: 10.6 10*3/uL — AB (ref 4.0–10.5)

## 2017-02-20 LAB — MRSA PCR SCREENING: MRSA BY PCR: NEGATIVE

## 2017-02-20 LAB — GLUCOSE, CAPILLARY
Glucose-Capillary: 213 mg/dL — ABNORMAL HIGH (ref 65–99)
Glucose-Capillary: 221 mg/dL — ABNORMAL HIGH (ref 65–99)

## 2017-02-20 MED ORDER — PERFLUTREN LIPID MICROSPHERE
1.0000 mL | INTRAVENOUS | Status: DC | PRN
  Filled 2017-02-20 (×3): qty 10

## 2017-02-20 MED ORDER — ORAL CARE MOUTH RINSE: 15.0000 mL | Freq: Two times a day (BID) | OROMUCOSAL | Status: DC

## 2017-02-20 MED ORDER — METHOCARBAMOL 500 MG PO TABS
1000.0000 mg | ORAL_TABLET | Freq: Four times a day (QID) | ORAL | Status: DC | PRN
  Administered 2017-02-20 – 2017-02-21 (×4): 1000 mg via ORAL
  Filled 2017-02-20 (×4): qty 2

## 2017-02-20 MED ORDER — OXYCODONE HCL 5 MG PO TABS
2.5000 mg | ORAL_TABLET | Freq: Once | ORAL | Status: AC
  Administered 2017-02-20: 2.5 mg via ORAL
  Filled 2017-02-20: qty 1

## 2017-02-20 MED ORDER — CHLORHEXIDINE GLUCONATE 0.12 % MT SOLN
15.0000 mL | Freq: Two times a day (BID) | OROMUCOSAL | Status: DC
  Administered 2017-02-20: 15 mL via OROMUCOSAL
  Filled 2017-02-20: qty 15

## 2017-02-20 MED ORDER — POTASSIUM CHLORIDE CRYS ER 20 MEQ PO TBCR: 40.0000 meq | EXTENDED_RELEASE_TABLET | ORAL | Status: DC

## 2017-02-20 MED ORDER — HEPARIN BOLUS VIA INFUSION
3000.0000 [IU] | Freq: Once | INTRAVENOUS | Status: AC
  Administered 2017-02-20: 3000 [IU] via INTRAVENOUS
  Filled 2017-02-20: qty 3000

## 2017-02-20 MED ORDER — DIAZEPAM 5 MG PO TABS
5.0000 mg | ORAL_TABLET | Freq: Two times a day (BID) | ORAL | Status: DC | PRN
  Administered 2017-02-20: 5 mg via ORAL
  Filled 2017-02-20: qty 1

## 2017-02-20 NOTE — Progress Notes (Signed)
Verbal order received to cancel echo

## 2017-02-20 NOTE — Plan of Care (Signed)
Problem: Activity: Goal: Risk for activity intolerance will decrease Outcome: Not Progressing Pt bed bound with extreme lower extremity weakness. Will provide skin care are reposition q2 to promote skin integrity

## 2017-02-20 NOTE — Progress Notes (Signed)
ANTICOAGULATION CONSULT NOTE - Follow Up Consult  Pharmacy Consult for Heparin Indication: R/O PE  Allergies  Allergen Reactions  . Metformin And Related Diarrhea and Nausea And Vomiting  . Vancomycin Rash    Significant "red man" like reaction on entire body that occurred several hours after Vancomycin infusion    Patient Measurements: Height: 5\' 3"  (160 cm) Weight: (!) 323 lb (146.5 kg) IBW/kg (Calculated) : 52.4 Heparin Dosing Weight: 90 kg  Vital Signs: Temp: 98.4 F (36.9 C) (10/18 0459) Temp Source: Axillary (10/18 0459) BP: 121/80 (10/18 0459) Pulse Rate: 90 (10/18 0459)  Labs:  Recent Labs  02/19/17 0847 02/19/17 2005 02/19/17 2156 02/20/17 0322  HGB 10.5*  --   --  9.4*  HCT 36.3  --   --  32.0*  PLT 252  --   --  258  LABPROT 13.3  --   --   --   INR 1.02  --   --   --   HEPARINUNFRC  --  <0.10*  --  <0.10*  CREATININE 0.99  --  0.83 0.83    Estimated Creatinine Clearance: 102.4 mL/min (by C-G formula based on SCr of 0.83 mg/dL).   Assessment: 61 y.o. female with SOB, possible PE, for heparin Goal of Therapy:  Heparin level 0.3-0.7 units/ml Monitor platelets by anticoagulation protocol: Yes   Plan:  Heparin 3000 units IV bolus, then increase heparin 2100 units/hr Check heparin level in 6 hours.    Dravon Nott, Gary Fleet 02/20/2017,5:59 AM

## 2017-02-20 NOTE — Progress Notes (Signed)
  Echocardiogram 2D Echocardiogram has been performed.  Barbara [name] [name] 02/20/2017, 1:27 PM

## 2017-02-20 NOTE — Progress Notes (Signed)
Family Medicine Teaching Service Daily Progress Note Intern Pager: [phone]  Patient name: Barbara [name] Medical record number: [id number] Date of birth: February 01, 1956 Age: 60 y.o. Gender: female  Primary Care Provider: Moses Manners, MD Consultants: none Code Status: dnr/dni  Pt Overview and Major Events to Date:  10/17 admitted to fpts 10/18 decision made to be comfort care  Assessment and Plan: subacute hypercarbic respiratory failure:  Likely due to acute HF exacerbation, especially with BNP 113 (from 50), CXR with pulmonary edema. Patient has been receiving lasix diuresis with no change in respiratory status. [name] had a long conversation with patient and patient now wishes to be comfort care. Will withdraw all treatment that is not contributing to comfort of patient. - transfer to medsurg - will continue lasix 40mg  bid, will help with respiratory status - d/c daily weights, I/Os -  No echo indicated - no CTA chest - no abx  Comfort measures Will increase robaxin and consider increasing tramadol as needed for comfort. Will leave on [place] as needed for comfort. Wound - continue home neurontin 300mg  TID - consider increasing tramadol - increasing robaxin to 1g tid - will d/c glucose checks, d/c insulin - will keep one IV in case needs iv pain meds  FEN/GI: regular diet PPx: none  Disposition: possible snf w/ hospice or home w/ hospice  Subjective:  Doing ok overnight. No complaints. Feels as though respiratory status has remained about the same.  Objective: Temp:  [98.1 F (36.7 C)-98.5 F (36.9 C)] 98.1 F (36.7 C) (10/18 0850) Pulse Rate:  [60-104] 90 (10/18 0459) Resp:  [13-20] 18 (10/18 0459) BP: (117-139)/(64-92) 128/92 (10/18 0850) SpO2:  [89 %-94 %] 94 % (10/18 0850) Physical Exam: General: obese, more alert today, getting 6L [place], AOx3 Cardiovascular: tachycardic, regular rhythm, palpable pt/dp Respiratory: on 6L [place], normal effort, limited auscultation  due to body habitus, but good airmovement and improving at the bases bilaterally. No wheezing or crackles noted  Gastrointestinal: soft, NT, ND, + bowel sounds  MSK: able to move upper extremities. Lower extremities are wrapped.  Derm: some blistering noted on the toes, stage 2 pressure ulcers on the left upper thigh and right lower buttock Neuro: intermittently sleepy but able to answer orientation questions and other questions. Oriented x 4.  GU: foley catheter in place Psych: pleasant but drowsy, alert during discussion  Laboratory:  Recent Labs Lab 02/19/17 0847 02/20/17 0322  WBC 11.0* 10.6*  HGB 10.5* 9.4*  HCT 36.3 32.0*  PLT 252 258    Recent Labs Lab 02/19/17 0847 02/19/17 2156 02/20/17 0322  NA 138 137 138  K 4.0 3.7 3.3*  CL 89* 88* 86*  CO2 39* 38* 40*  BUN 15 14 15   CREATININE 0.99 0.83 0.83  CALCIUM 9.1 8.7* 8.8*  PROT 7.1  --  6.6  BILITOT 0.7  --  0.5  ALKPHOS 89  --  73  ALT 19  --  17  AST 42*  --  36  GLUCOSE 285* 431* 330*    Imaging/Diagnostic Tests: Study Result   CLINICAL DATA:  Shortness of Breath  EXAM: PORTABLE CHEST 1 VIEW  COMPARISON:  02/06/2017  FINDINGS: Cardiomegaly. Diffuse bilateral airspace opacities, likely edema/ CHF. No visible layering effusion scratched at no visible effusion or acute bony abnormality.  IMPRESSION: Cardiomegaly with diffuse bilateral airspace disease, increased since prior study, most compatible with moderate CHF.      Myrene Buddy, MD 02/20/2017, 12:13 PM PGY-1, [facility] Family Medicine FPTS  Intern pager: [phone], text pages welcome

## 2017-02-20 NOTE — Progress Notes (Signed)
Inpatient Diabetes Program Recommendations  AACE/ADA: New Consensus Statement on Inpatient Glycemic Control (2015)  Target Ranges:  Prepandial:   less than 140 mg/dL      Peak postprandial:   less than 180 mg/dL (1-2 hours)      Critically ill patients:  140 - 180 mg/dL   Lab Results  Component Value Date   GLUCAP 213 (H) 02/20/2017   HGBA1C 7.5 (H) 09/24/2016   Review of Glycemic Control  Diabetes history: DM 2 Outpatient Diabetes medications: Novolog 0-5 units qam, Lantus 13 units QHS Current orders for Inpatient glycemic control: Lantus 10 units, Novolog Moderate 0-15 units tid + Novolog HS scale 0-5 units  Inpatient Diabetes Program Recommendations:    Glucose still elevated in the 200's after Lantus 10 given. Per Med Rec patient takes 13 units of Lantus. Please consider increasing Lantus to patient's home dose, Lantus 13 units.  Thanks,  Christena DeemShannon Cherine Drumgoole RN, MSN, [facility] Inpatient Diabetes Coordinator Team Pager [phone] (8a-5p)

## 2017-02-20 NOTE — Care Management Note (Addendum)
Case Management Note  Patient Details  Name: Barbara [name] MRN: [id number] Date of Birth: 1956-04-06  Subjective/Objective:   Pt presented for Acute Respiratory Failure. Pt is from home and PTA active with [facility] Services: RN, PT Aide Services. CM did reach out to [employer] at the [employer]: [phone]. Pt was previously active with [facility] Services. Palliative Consult may be beneficial this admission.                  Action/Plan: CM will continue to monitor for additional disposition needs.   Expected Discharge Date:                  Expected Discharge Plan:  Home w Home Health Services  In-House Referral:  NA  Discharge planning Services  CM Consult  Post Acute Care Choice:  Home Health, Resumption of Svcs/PTA Provider Choice offered to:  Patient  DME Arranged:  N/A DME Agency:  NA  HH Arranged:  RN, PT, Nurse's Aide HH Agency:  [facility]  Status of Service:  Completed, signed off  If discussed at Long Length of Stay Meetings, dates discussed:    Additional Comments: 1546 02-20-17 Tomi Bamberger, RN, BSN [phone] Pt is now Comfort Care- CM to monitor if plan will be for Home Hospice vs Residential.  Gala Lewandowsky, RN 02/20/2017, 2:20 PM

## 2017-02-20 NOTE — Consult Note (Signed)
   [facility] CM Inpatient Consult   02/20/2017  Barbara [name] 1955-07-17 [id number]   Patient is currently active with [facility] Care Management for chronic disease management services.  HF exacerbation verse pulmonary edema. Alerted [facility] nurse of admission.  Patient has been engaged by a [employer] and CSW.  Our community based plan of care has focused on disease management and community resource support such as [facility] for transportation, video SKYPE MD visits.  Patient will receive a post discharge transition of care call and will be evaluated for monthly home visits for assessments and disease process education. Also, active with [facility].  Made Inpatient Case Manager aware that [facility] Care Management following and patient could benefit to explore Palliative Care options. Patient currently resting from just getting her lunch from 2D ECHO.  Will follow.   Of note, [facility] Care Management services does not replace or interfere with any services that are needed or arranged by inpatient case management or social work.  For additional questions or referrals please contact:  Charlesetta Shanks, RN BSN CCM [facility]  [phone] business mobile phone Toll free office [phone]

## 2017-02-21 ENCOUNTER — Ambulatory Visit: Payer: Self-pay | Admitting: *Deleted

## 2017-02-21 DIAGNOSIS — I5033 Acute on chronic diastolic (congestive) heart failure: Principal | ICD-10-CM

## 2017-02-21 DIAGNOSIS — Z7189 Other specified counseling: Secondary | ICD-10-CM

## 2017-02-21 DIAGNOSIS — Z515 Encounter for palliative care: Secondary | ICD-10-CM

## 2017-02-21 LAB — GLUCOSE, CAPILLARY: Glucose-Capillary: 260 mg/dL — ABNORMAL HIGH (ref 65–99)

## 2017-02-21 LAB — URINE CULTURE
Culture: >=100000 — AB
Special Requests: NORMAL

## 2017-02-21 MED ORDER — METHOCARBAMOL 500 MG PO TABS: 1000.0000 mg | ORAL_TABLET | ORAL | Status: DC | PRN

## 2017-02-21 MED ORDER — GABAPENTIN 600 MG PO TABS
600.0000 mg | ORAL_TABLET | Freq: Three times a day (TID) | ORAL | Status: DC
  Administered 2017-02-21: 600 mg via ORAL
  Filled 2017-02-21: qty 1

## 2017-02-21 MED ORDER — TRAMADOL HCL 50 MG PO TABS
100.0000 mg | ORAL_TABLET | Freq: Three times a day (TID) | ORAL | Status: DC | PRN
  Administered 2017-02-21: 100 mg via ORAL
  Filled 2017-02-21: qty 2

## 2017-02-21 MED ORDER — METHOCARBAMOL 500 MG PO TABS
1000.0000 mg | ORAL_TABLET | Freq: Four times a day (QID) | ORAL | Status: DC | PRN
  Administered 2017-02-21: 1000 mg via ORAL
  Filled 2017-02-21: qty 2

## 2017-02-21 MED ORDER — GABAPENTIN 400 MG PO CAPS: 400.0000 mg | ORAL_CAPSULE | Freq: Three times a day (TID) | ORAL | Status: DC

## 2017-02-21 NOTE — Consult Note (Addendum)
Consultation Note Date: 02/21/2017   Patient Name: Barbara [name]  DOB: 11/02/1955  MRN: [id number]  Age / Sex: 61 y.o., female  PCP: Barbara [name], Barbara A, MD Referring Physician: Moses [name], Barbara A, MD  Reason for Consultation: Establishing goals of care  HPI/Patient Profile: 61 y.o. female  with past medical history of multiple sclerosis, chronic indwelling Foley catheter, chronic recurrent urinary tract infections, chronic atelectasis, gradual progressive functional decline, bed bound status since the last 9-10 months admitted on 02/19/2017 with possible diastolic congestive heart failure exacerbation, pulmonary edema, also noted to have moderate to severe aortic stenosis on surface echocardiogram obtained this [name] stay with ejection fraction documented to be 55-60 percent, chest x-ray showing pulmonary edema, patient noted to have elected. Of comfort measures and has been placed on comfort measures only by primary service, a palliative consultation for additional goals of care discussions, appropriate disposition planning, appropriate ongoing symptom management discussions.   Clinical Assessment and Goals of Care:  Barbara DecemberSharon is resting in bed. She opens her eyes. She answers all questions appropriately. I introduced myself and palliative care as follows: Palliative medicine is specialized medical care for people living with serious illness. It focuses on providing relief from the symptoms and stress of a serious illness. The goal is to improve quality of life for both the patient and the family.  There is no family present at the bedside. Brief life review and life history performed. [place]. Patient has 3 sisters and one brother. Patient does not have any kids. Her husband died a few years ago. She was diagnosed with multiple sclerosis in 2012.  She has had ongoing decline, sub acute- chronic essentially since the last 1 year. Her mobility has declined significantly. She has essentially been bed bound since January 2018. She has chronic indwelling Foley catheter and has had the burden of severe chronic recurrent urinary tract infections. She also has had atelectasis and ongoing progressive decline. In summer of 2018- just a few months ago, she had discussed about enrolling in hospice services and staying at home. She is active with [facility].  We discussed about hospice philosophy of care. The patient is clear about staying in her home as long as possible. She is accepting and agreeable to hospice support at home. She does have some caregivers/siblings who come in and stay with her. She states that her only wish is to not go to a skilled nursing facility.  Call placed and discussed with patient's sister healthcare power of attorney agent Barbara Jonesarolyn. She endorses all of the above. She states that she agrees with the patient, the time has come to focus exclusively on comfort measures as well as for honoring the patient's wishes which are to have the patient stay in her own home surroundings for as long as possible.  Patient does have some durable medical equipment already present at home. She has a [name] bed. She has a lift chair. She has a Nurse, adultHoyer lift. She does not  believe she would want recurrent hospitalizations or active treatments for infections. She wishes to focus exclusively on a mode of care that will provide her comfort and dignity in her own home.  recommendations below. Thank you for the consult.  HCPOA  Sister Barbara [name] [phone]  SUMMARY OF RECOMMENDATIONS    DO NOT RESUSCITATE/DO NOT INTUBATE. Comfort measures only. No further hospitalizations, no active treatment for infections Continue chronic indwelling Foley Home with hospice. Choices discussed patient and family prefer hospice of Barbara [name]: case  manager consult to help facilitate.  Continue current symptom management medications: Tramadol and oxycodone for now. Hospice to titrate his medicines in the home setting based on patient's symptom management,agree with fan at bedside for supplemental relief from baseline dyspnea. Continue supplemental oxygen via nasal cannula  Code Status/Advance Care Planning:  DNR    Symptom Management:     noted above  Palliative Prophylaxis:   Bowel Regimen  Additional Recommendations (Limitations, Scope, Preferences):  Full Comfort Care  Psycho-social/Spiritual:   Desire for further Chaplaincy support:yes  Additional Recommendations: Education on Hospice  Prognosis:   < 3 months  Discharge Planning: Home with Hospice      Primary Diagnoses: Present on Admission: . Acute respiratory failure (HCC)   I have reviewed the medical record, interviewed the patient and family, and examined the patient. The following aspects are pertinent.  Past Medical History:  Diagnosis Date  . Absence of bladder continence   . 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 Cellulitis, leg 09/24/2016  . Cervical myelopathy (HCC) 08/07/2011  . CHF (congestive heart failure) (HCC) 02/06/2017  . Chronic gout 12/07/2010   Infrequent flairs   . Chronic indwelling Foley catheter 11/29/2016  . Chronic venous insufficiency 11/05/2012  . Current chronic use of systemic steroids 03/07/2016   No shingles vaccine  . Decubitus ulcer of sacral region   . Diabetes mellitus without complication (HCC)   . DM type 2, not at goal [facility]) 03/06/2016  . Essential hypertension   . Gout   . Hypertension   . Hypoxia   . Impaired mobility and activities of daily living 03/07/2016  . Lower back pain   . Lower extremity weakness 04/02/2016  . Lumbar spondylosis   . Morbid obesity (HCC) 12/07/2010   Has always been large.  Since MS has been trying and partially successful at wt loss.     . MS (multiple sclerosis) (HCC)   . Multiple sclerosis  (HCC) 12/07/2010   Diagnosed 05/2010.  Managed by Dr. Sandria Manly   . Numbness   . Shortness of breath   . Spastic paraplegia   . Spinal stenosis   . Spinal stenosis in cervical region 06/03/2011    [facility] (Dr. Timoteo Ace) anterior discectomies and fusion at C5-6,6-7 & 7-1 done on 06/14/11.    Marland Kitchen Spinal stenosis of lumbar region 12/07/2010   Chronic low back pain.  Left leg weak but unclear if due to MS or spinal stenosis.  Pain well controled by tramadol.    Now (Jan 2013)  Managed  [facility], who plans at a later date lumbar laminectomy L2-4.   Marland Kitchen Weakness   . Weakness of both lower extremities    Social History   Social History  . Marital status: Widowed    Spouse name: N/A  . Number of children: 0  . Years of education: 7   Occupational History  .  [employer]   Social History Main Topics  . Smoking status: Never Smoker  .  Smokeless tobacco: Never Used  . Alcohol use No  . Drug use: No  . Sexual activity: Not Currently    Birth control/ protection: Abstinence     Comment: husband died 11/16/10   Other Topics Concern  . Not on file   Social History Narrative   Patient lives at home alone.   Caffeine Use: 1 cup daily   Pet: Dog, boxer   Family History  Problem Relation Age of Onset  . Heart disease Mother   . Diabetes Mother   . Cancer Father    Scheduled Meds: . furosemide  40 mg Intravenous BID  . gabapentin  300 mg Oral TID  . predniSONE  15 mg Oral Q breakfast   Continuous Infusions: PRN Meds:.diazepam, methocarbamol, traMADol Medications Prior to Admission:  Prior to Admission medications   Medication Sig Start Date End Date Taking? Authorizing Provider  Apple Cider Vinegar 188 MG CAPS Take 188 mg by mouth daily.  11/29/16  Yes McDiarmid, Leighton Roach, MD  aspirin EC 325 MG tablet Take 325 mg by mouth every evening.    Yes [provider]  Cholecalciferol (VITAMIN D3) 1000 units CAPS Take 4,000 Units by mouth every evening.    Yes [provider]  Coenzyme Q10  (COQ10) 100 MG CAPS Take 100 mg by mouth every evening.   Yes [provider]  diazepam (VALIUM) 5 MG tablet TAKE 1 TABLET BY MOUTH EVERY 6 HOURS AS NEEDED FOR LEG SPASMS 12/25/16  Yes [name], Santiago Bumpers, MD  furosemide (LASIX) 20 MG tablet TAKE 1 TABLET BY MOUTH 2 TIMES DAILY. 01/31/17  Yes [name], Santiago Bumpers, MD  gabapentin (NEURONTIN) 300 MG capsule Take 1 capsule (300 mg total) by mouth 3 (three) times daily. 12/16/16  Yes Bland, Scott, DO  ibuprofen (ADVIL,MOTRIN) 400 MG tablet Take 1 tablet (400 mg total) by mouth every 6 (six) hours as needed for headache or moderate pain. 04/05/16  Yes Riccio, Marylene Land C, DO  insulin aspart (NOVOLOG) 100 UNIT/ML FlexPen Inject 0-5 Units into the skin every morning.  11/29/16  Yes McDiarmid, Leighton Roach, MD  Insulin Glargine (LANTUS) 100 UNIT/ML Solostar Pen Inject 10 Units into the skin daily at 10 pm. Patient taking differently: Inject 13 Units into the skin at bedtime.  09/16/16  Yes [name], Santiago Bumpers, MD  Magnesium 250 MG TABS Take 250 mg by mouth every evening.    Yes [provider]  methocarbamol (ROBAXIN) 500 MG tablet Take 0.5 tablets (250 mg total) by mouth 3 (three) times daily as needed for muscle spasms. 12/16/16  Yes Marthenia Rolling, DO  Multiple Vitamin (MULTIVITAMIN WITH MINERALS) TABS tablet Take 1 tablet by mouth every evening.   Yes [provider]  nitrofurantoin (MACRODANTIN) 50 MG capsule Take 1 capsule (50 mg total) by mouth 2 (two) times daily. 10/16/16  Yes [name], Santiago Bumpers, MD  predniSONE (DELTASONE) 5 MG tablet Take 3 tablets (15 mg total) by mouth daily with breakfast. 12/17/16  Yes Bland, Scott, DO  traMADol (ULTRAM) 50 MG tablet Take 1 tablet (50 mg total) by mouth every 8 (eight) hours as needed. Patient taking differently: Take 50 mg by mouth every 8 (eight) hours as needed for moderate pain.  12/16/16  Yes Marthenia Rolling, DO   Allergies  Allergen Reactions  . Metformin And Related Diarrhea and Nausea And Vomiting  .  Vancomycin Rash    Significant "red man" like reaction on entire body that occurred several hours after Vancomycin infusion   Review  of Systems Denies pain, denies shortness of breath, some baseline shortness of breath, no worse than usual.  Physical Exam Obese lady, resting in bed, bed bound, on supplemental oxygen 6 L nasal cannula, resting comfortably next line opens eyes, answers questions appropriately, is able to provide all aspects of her medical history Bilateral lower extremity edema, upper extremity edema,  abdomen soft mildly distended Shallow clear breath sounds anterior lung fields Has chronic indwelling Foley catheter in place Wound care notes reviewed, patient noted to have pressure ulcers    Vital Signs: BP 101/72   Pulse 86   Temp 97.8 F (36.6 C) (Oral)   Resp 18   Ht 5\' 3"  (1.6 m)   Wt (!) 146.5 kg (323 lb)   LMP 10/07/2010   SpO2 95%   BMI 57.22 kg/m  Pain Assessment: No/denies pain   Pain Score: 0-No pain   SpO2: SpO2: 95 % O2 Device:SpO2: 95 % O2 Flow Rate: .O2 Flow Rate (L/min): 6 L/min  IO: Intake/output summary:  Intake/Output Summary (Last 24 hours) at 02/21/17 1023 Last data filed at 02/21/17 0914  Gross per 24 hour  Intake                0 ml  Output             3650 ml  Net            -3650 ml  PPS 30%  LBM: Last BM Date: 02/19/17 Baseline Weight: Weight: (!) 146.5 kg (323 lb) Most recent weight: Weight: (!) 146.5 kg (323 lb)     Palliative Assessment/Data:     Time In:  9 Time Out:10.10   Time Total:  70  Greater than 50%  of this time was spent counseling and coordinating care related to the above assessment and plan.  Signed by: Rosalin Hawking, MD  [phone]  Please contact Palliative Medicine Team phone at [phone] for questions and concerns.  For individual provider: See Loretha Stapler

## 2017-02-21 NOTE — Progress Notes (Signed)
Family Medicine Teaching Service Daily Progress Note Intern Pager: [phone]  Patient name: Barbara [name] Medical record number: [id number] Date of birth: [birth year] Age: 61 y.o. Gender: female  Primary Care Provider: Moses Manners, MD Consultants: none Code Status: dnr/dni  Pt Overview and Major Events to Date:  10/17 admitted to fpts 10/18 decision made to be comfort care  Assessment and Plan: Barbara [name] is a 61 y.o. female presenting with acute hypoxic respiratory failure. PMH is significant for HFpEF with chronic 2L O2, MS, chronic foley, obesity, HTN, DM2.   Subacute hypercarbic respiratory failure:  Likely due to acute HF exacerbation, especially with BNP 113 (from 50), CXR with pulmonary edema. Patient has been receiving lasix diuresis with no change in respiratory status. [name] had a long conversation with patient and patient now wishes to be comfort care. Will withdraw all treatment that is not contributing to comfort of patient. - will continue lasix 40mg  bid, will help with respiratory status  Comfort measures Will increase robaxin and consider increasing tramadol as needed for comfort. Will leave on Hadar as needed for comfort. Palliative care consulted, recommendations for home hospice who will titrate medications.  - continue home neurontin 300mg  TID - consider increasing tramadol - increasing robaxin to 1g tid - valium 5mg  q12 hours prn  - will keep one IV in case needs iv pain meds - palliative care consulted, appreciate recommendations   FEN/GI: regular diet PPx: none  Disposition: possible SNF w/ hospice or home with hospice   Subjective:  Patient today has no complaints. States she feels well. States some pain, but states this is adequately controlled with pain medicine. Denies SOB. States she would like [name] home hospice. Patient is addiment that she does not want SNF placement   Objective: Temp:  [97.8 F (36.6 C)-98.1 F (36.7 C)] 97.8  F (36.6 C) (10/18 1900) Pulse Rate:  [86] 86 (10/18 1312) BP: (101-140)/(72-86) 101/72 (10/18 2100) SpO2:  [95 %] 95 % (10/18 1312) Physical Exam: General: awake and alert, laying in bed, NAD Cardiovascular: RRR, no MRG Respiratory: limited exam due to body habitus, CTAB, no wheezes, rales, or rhonchi  Abdomen: soft, non tender, non distended, bowel sounds normal  Extremities: lower extremities wrapped, 2+ pitting edema noted  Laboratory:  Recent Labs Lab 02/19/17 0847 02/20/17 0322  WBC 11.0* 10.6*  HGB 10.5* 9.4*  HCT 36.3 32.0*  PLT 252 258    Recent Labs Lab 02/19/17 0847 02/19/17 2156 02/20/17 0322  NA 138 137 138  K 4.0 3.7 3.3*  CL 89* 88* 86*  CO2 39* 38* 40*  BUN 15 14 15   CREATININE 0.99 0.83 0.83  CALCIUM 9.1 8.7* 8.8*  PROT 7.1  --  6.6  BILITOT 0.7  --  0.5  ALKPHOS 89  --  73  ALT 19  --  17  AST 42*  --  36  GLUCOSE 285* 431* 330*     Imaging/Diagnostic Tests: Dg Chest 2 View  Result Date: 02/06/2017 CLINICAL DATA:  Initial evaluation for acute weakness. EXAM: CHEST  2 VIEW COMPARISON:  Prior radiograph from 12/11/2016. FINDINGS: Stable cardiomegaly.  Mediastinal silhouette is unchanged. Lungs hypoinflated. Diffuse vascular congestion with interstitial prominence, suggesting mild pulmonary interstitial edema. No frank alveolar edema. No definite focal infiltrates. No definite pleural effusion. No pneumothorax. No acute osseous abnormality.  Cervical ACDF noted. IMPRESSION: Cardiomegaly with mild diffuse pulmonary interstitial edema/ congestion. Electronically Signed   By: Rise Mu M.D.   On: 02/06/2017 17:20  Ct Angio Chest Pe W Or Wo Contrast  Result Date: 02/06/2017 CLINICAL DATA:  Shortness of breath with cough EXAM: CT ANGIOGRAPHY CHEST WITH CONTRAST TECHNIQUE: Multidetector CT imaging of the chest was performed using the standard protocol during bolus administration of intravenous contrast. Multiplanar CT image reconstructions and  MIPs were obtained to evaluate the vascular anatomy. CONTRAST:  80 cc Isovue 370 intravenous COMPARISON:  Radiograph 02/06/2017, CT chest 12/07/2016 FINDINGS: Cardiovascular: Satisfactory opacification of the pulmonary arteries to the segmental level. Possible small nonocclusive filling defect within a right lower lobe segmental branch vessel. No other definitive filling defects are visualized. Ectatic ascending aorta measuring up to 3.7 cm. No dissection. Enlarged pulmonary artery trunk measuring up to 4.2 cm. Mild coronary artery calcification. Mild cardiomegaly. No pericardial effusion Mediastinum/Nodes: No enlarged mediastinal, hilar, or axillary lymph nodes. Thyroid gland, trachea, and esophagus demonstrate no significant findings. Lungs/Pleura: Bilateral ground-glass densities. No large pleural effusion. More confluent ground-glass density in the right upper lobe. Negative for a pneumothorax. Upper Abdomen: Diffuse increased density in the gallbladder suspicious for sludge. Multiple stones in the gallbladder. Musculoskeletal: Degenerative changes. No acute or suspicious bone lesion. Postsurgical changes of the lower cervical spine. Review of the MIP images confirms the above findings. IMPRESSION: 1. Study degraded by body habitus. Possible small filling defect within a segmental right lower lobe pulmonary artery branch vessel, cannot exclude a small embolus in this region. 2. Enlarged pulmonary artery trunk suspicious for pulmonary artery hypertension 3. Diffuse bilateral ground-glass density with cardiomegaly, suggestive of pulmonary edema. More focal airspace disease in the right upper lobe may reflect additional pneumonia. 4. Sludge filled gallbladder with multiple stones Electronically Signed   By: Jasmine PangKim  Fujinaga M.D.   On: 02/06/2017 23:40   Dg Chest Port 1 View  Result Date: 02/19/2017 CLINICAL DATA:  Shortness of Breath EXAM: PORTABLE CHEST 1 VIEW COMPARISON:  02/06/2017 FINDINGS: Cardiomegaly.  Diffuse bilateral airspace opacities, likely edema/ CHF. No visible layering effusion scratched at no visible effusion or acute bony abnormality. IMPRESSION: Cardiomegaly with diffuse bilateral airspace disease, increased since prior study, most compatible with moderate CHF. Electronically Signed   By: Charlett NoseKevin  Dover M.D.   On: 02/19/2017 09:08   Dg Finger Index Left  Result Date: 02/06/2017 CLINICAL DATA:  Twisted index finger a few weeks ago with re-injury again today. EXAM: LEFT INDEX FINGER 2+V COMPARISON:  None. FINDINGS: Soft tissue swelling over the second PIP joint. Subtle acute versus chronic chip fracture along the volar base of the second distal phalanx. Possible subtle fracture along the base of the second middle phalanx seen only on the lateral film. Mild degenerative change at the first and second MCP joints. IMPRESSION: Chip fracture along the volar base of the second distal phalanx which may be acute or chronic. Possible subtle fracture at the base of the second middle phalanx. Electronically Signed   By: Elberta Fortisaniel  Boyle M.D.   On: 02/06/2017 19:58     Oralia ManisAbraham, Nary Sneed, DO 02/21/2017, 11:10 AM PGY-1, [facility] FPTS Intern pager: [phone], text pages welcome

## 2017-02-21 NOTE — Consult Note (Signed)
   [facility] CM Inpatient Consult   02/21/2017  Barbara [name] 18-Sep-1955 [id number]   Update:  Spoke with inpatient RNCM regarding disposition and needs.  Patient has decided to discharge home with home Hospice care with [facility].  Met with the patient, her sister, Audrea Muscat and brother Joe at the bedside. Patient states she is likely to discharge home today, she will have full care management services with [facility] and Hospice.  Will update [place] Coordinator of disposition for follow up and case closure needs. Patient verbalized no further [facility] needs.  For questions, please contact:  Natividad Brood, RN BSN [facility] Liaison  [phone] business mobile phone Toll free office [phone]

## 2017-02-21 NOTE — Discharge Summary (Signed)
Family Medicine Teaching [facility] Discharge Summary  Patient name: Barbara [name] Medical record number: [id number] Date of birth: Apr 09, 1956 Age: 61 y.o. Gender: female Date of Admission: 02/19/2017  Date of Discharge: 02/21/2017 Admitting Physician: Moses Manners, MD  Primary Care Provider: Moses Manners, MD Consultants: Palliative   Indication for Hospitalization: acute hypoxic respiratory failure  Discharge Diagnoses/Problem List:  Subacute hypercarbic respiratory failure Comfort measures   Disposition: home with home hospice   Discharge Condition: stable   Discharge Exam:  Please refer to progress note from date of discharge   Brief Hospital Course:  Barbara [name] is a 61 y.o. female who presented in respiratory failure. On admission was diagnosed with likely acute HF exacerbation, with elevated BNP of 113. CXR on admission showing pulmonary edema. ABG on admission showing normal pH with worsened hypercarbia with appropriate elevation of bicarb, consistent with subacute instead of acute. Patient was originally put on HFNC and IV lasix. Patient also presented with lactic acidosis. On 10/18 patient made decision to change to comfort care and all treatments not contributing to comfort of patient were withdrawn. Patient was left on lasix as this will help with respiratory status and pain medication was kept. Patient stated she would like to go home as soon as possible with home hospice.   Significant Labs and Imaging:   Recent Labs Lab 02/19/17 0847 02/20/17 0322  WBC 11.0* 10.6*  HGB 10.5* 9.4*  HCT 36.3 32.0*  PLT 252 258    Recent Labs Lab 02/19/17 0847 02/19/17 2156 02/20/17 0322  NA 138 137 138  K 4.0 3.7 3.3*  CL 89* 88* 86*  CO2 39* 38* 40*  GLUCOSE 285* 431* 330*  BUN 15 14 15   CREATININE 0.99 0.83 0.83  CALCIUM 9.1 8.7* 8.8*  ALKPHOS 89  --  73  AST 42*  --  36  ALT 19  --  17  ALBUMIN 2.6*  --  2.4*   Dg Chest 2 View  Result Date:  02/06/2017 CLINICAL DATA:  Initial evaluation for acute weakness. EXAM: CHEST  2 VIEW COMPARISON:  Prior radiograph from 12/11/2016. FINDINGS: Stable cardiomegaly.  Mediastinal silhouette is unchanged. Lungs hypoinflated. Diffuse vascular congestion with interstitial prominence, suggesting mild pulmonary interstitial edema. No frank alveolar edema. No definite focal infiltrates. No definite pleural effusion. No pneumothorax. No acute osseous abnormality.  Cervical ACDF noted. IMPRESSION: Cardiomegaly with mild diffuse pulmonary interstitial edema/ congestion. Electronically Signed   By: Rise Mu M.D.   On: 02/06/2017 17:20   Ct Angio Chest Pe W Or Wo Contrast  Result Date: 02/06/2017 CLINICAL DATA:  Shortness of breath with cough EXAM: CT ANGIOGRAPHY CHEST WITH CONTRAST TECHNIQUE: Multidetector CT imaging of the chest was performed using the standard protocol during bolus administration of intravenous contrast. Multiplanar CT image reconstructions and MIPs were obtained to evaluate the vascular anatomy. CONTRAST:  80 cc Isovue 370 intravenous COMPARISON:  Radiograph 02/06/2017, CT chest 12/07/2016 FINDINGS: Cardiovascular: Satisfactory opacification of the pulmonary arteries to the segmental level. Possible small nonocclusive filling defect within a right lower lobe segmental branch vessel. No other definitive filling defects are visualized. Ectatic ascending aorta measuring up to 3.7 cm. No dissection. Enlarged pulmonary artery trunk measuring up to 4.2 cm. Mild coronary artery calcification. Mild cardiomegaly. No pericardial effusion Mediastinum/Nodes: No enlarged mediastinal, hilar, or axillary lymph nodes. Thyroid gland, trachea, and esophagus demonstrate no significant findings. Lungs/Pleura: Bilateral ground-glass densities. No large pleural effusion. More confluent ground-glass density in the right upper lobe. Negative for  a pneumothorax. Upper Abdomen: Diffuse increased density in the  gallbladder suspicious for sludge. Multiple stones in the gallbladder. Musculoskeletal: Degenerative changes. No acute or suspicious bone lesion. Postsurgical changes of the lower cervical spine. Review of the MIP images confirms the above findings. IMPRESSION: 1. Study degraded by body habitus. Possible small filling defect within a segmental right lower lobe pulmonary artery branch vessel, cannot exclude a small embolus in this region. 2. Enlarged pulmonary artery trunk suspicious for pulmonary artery hypertension 3. Diffuse bilateral ground-glass density with cardiomegaly, suggestive of pulmonary edema. More focal airspace disease in the right upper lobe may reflect additional pneumonia. 4. Sludge filled gallbladder with multiple stones Electronically Signed   By: Jasmine Pang M.D.   On: 02/06/2017 23:40   Dg Chest Port 1 View  Result Date: 02/19/2017 CLINICAL DATA:  Shortness of Breath EXAM: PORTABLE CHEST 1 VIEW COMPARISON:  02/06/2017 FINDINGS: Cardiomegaly. Diffuse bilateral airspace opacities, likely edema/ CHF. No visible layering effusion scratched at no visible effusion or acute bony abnormality. IMPRESSION: Cardiomegaly with diffuse bilateral airspace disease, increased since prior study, most compatible with moderate CHF. Electronically Signed   By: Charlett Nose M.D.   On: 02/19/2017 09:08   Dg Finger Index Left  Result Date: 02/06/2017 CLINICAL DATA:  Twisted index finger a few weeks ago with re-injury again today. EXAM: LEFT INDEX FINGER 2+V COMPARISON:  None. FINDINGS: Soft tissue swelling over the second PIP joint. Subtle acute versus chronic chip fracture along the volar base of the second distal phalanx. Possible subtle fracture along the base of the second middle phalanx seen only on the lateral film. Mild degenerative change at the first and second MCP joints. IMPRESSION: Chip fracture along the volar base of the second distal phalanx which may be acute or chronic. Possible subtle  fracture at the base of the second middle phalanx. Electronically Signed   By: Elberta Fortis M.D.   On: 02/06/2017 19:58     Results/Tests Pending at Time of Discharge:  Unresulted Labs    None     Discharge Medications:  Allergies as of 02/21/2017      Reactions   Metformin And Related Diarrhea, Nausea And Vomiting   Vancomycin Rash   Significant "red man" like reaction on entire body that occurred several hours after Vancomycin infusion      Medication List    STOP taking these medications   Apple Cider Vinegar 188 MG Caps   CoQ10 100 MG Caps   furosemide 20 MG tablet Commonly known as:  LASIX   insulin aspart 100 UNIT/ML FlexPen Commonly known as:  NOVOLOG   Insulin Glargine 100 UNIT/ML Solostar Pen Commonly known as:  LANTUS   Magnesium 250 MG Tabs   multivitamin with minerals Tabs tablet   nitrofurantoin 50 MG capsule Commonly known as:  MACRODANTIN   predniSONE 5 MG tablet Commonly known as:  DELTASONE   Vitamin D3 1000 units Caps     TAKE these medications   aspirin EC 325 MG tablet Take 325 mg by mouth every evening.   diazepam 5 MG tablet Commonly known as:  VALIUM TAKE 1 TABLET BY MOUTH EVERY 6 HOURS AS NEEDED FOR LEG SPASMS   gabapentin 300 MG capsule Commonly known as:  NEURONTIN Take 1 capsule (300 mg total) by mouth 3 (three) times daily.   ibuprofen 400 MG tablet Commonly known as:  ADVIL,MOTRIN Take 1 tablet (400 mg total) by mouth every 6 (six) hours as needed for headache or moderate pain.  methocarbamol 500 MG tablet Commonly known as:  ROBAXIN Take 0.5 tablets (250 mg total) by mouth 3 (three) times daily as needed for muscle spasms.   traMADol 50 MG tablet Commonly known as:  ULTRAM Take 1 tablet (50 mg total) by mouth every 8 (eight) hours as needed. What changed:  reasons to take this       Discharge Instructions: Please refer to Patient Instructions section of EMR for full details.  Patient was counseled important  signs and symptoms that should prompt return to medical care, changes in medications, dietary instructions, activity restrictions, and follow up appointments.   Follow-Up Appointments:   Oralia Manisbraham, Omelia Marquart, DO 02/21/2017, 11:24 PM PGY-1, [facility]

## 2017-02-21 NOTE — Care Management (Addendum)
Case Management Note Original note started by Tomi Bamberger, RN. CM  Patient Details  Name: Barbara [name] MRN: [id number] Date of Birth: 1956-04-22  Subjective/Objective:   Pt presented for Acute Respiratory Failure. Pt is from home and PTA active with [facility] and Hospice for Home Health Services: RN, PT Aide Services. CM did reach out to [employer] at the [employer]: [phone]. Pt was previously active with [facility]. Palliative Consult may be beneficial this admission.                  Action/Plan: CM will continue to monitor for additional disposition needs.   Expected Discharge Date:                         Expected Discharge Plan:  Home w Home Health Services  In-House Referral:  NA  Discharge planning Services  CM Consult  Post Acute Care Choice:  Home Health, Resumption of Svcs/PTA Provider Choice offered to:  Patient  DME Arranged:  Oximizer DME Agency:  [facility]  HH Arranged:  RN, PT, Nurse's Aide HH Agency:  [facility]  Status of Service:  Completed, signed off  If discussed at Long Length of Stay Meetings, dates discussed:    Additional Comments: 02/21/17 1515 Deveron Furlong, RN [phone] Pt continues to O2 @ 6 lpm.  In attempt to wean, pt's O2 sat 86% on 5lpm.  Oximizer requested from [facility] and delivered to room prior to d/c.  [facility] called for transport. ETA for [facility] approximately 3 hours.  Pt and sister at [facility] advised.  Pt or staff will call her when [facility] arrives so that sister can meet pt at house.  10/19/181400 Deveron Furlong, RN [phone] [facility] returned call and Hospice care can start in patient's home tomorrow.  Medical Necessity form completed and ambulance arranged.  DC order placed by MD with order for Hospice.  02/21/17 1055 Deveron Furlong, RN [phone] Pt wants to return home with [facility].  Pt has 24/7 caregivers stay with her who are family and friends.  Pt has family  in the area who help arrange and provide care.  Pt has hospital bed, lift, and all other equipment she needs through [facility].  Pt will need ambulance transport home- will arrange to coordinate with family presence to let patient into house as she has no key. [facility] contacted and advised.  Pt will need to stay until [facility] calls back to ensure [facility].   1546 02-20-17 Tomi Bamberger, RN, BSN [phone] Pt is now Comfort Care- CM to monitor if plan will be for Home Hospice vs Residential.  Gala Lewandowsky, RN 02/20/2017, 2:20 PM

## 2017-02-24 ENCOUNTER — Telehealth: Payer: Self-pay | Admitting: *Deleted

## 2017-02-24 LAB — CULTURE, BLOOD (ROUTINE X 2)
CULTURE: NO GROWTH
Culture: NO GROWTH
SPECIAL REQUESTS: ADEQUATE

## 2017-02-24 NOTE — Telephone Encounter (Signed)
Called and given verbal orders as requested.

## 2017-02-24 NOTE — Telephone Encounter (Signed)
Aram BeechamCynthia calls for multiple reasons:  1. Patient was d/c with the intent of being on comfort care and did not want to continue lasix and lantus.  Per Aram Beechamynthia, pt "feels better now and I think I should be back on those medications".  Cynthina needs verbal orders from MD if he is ok with that.   2. Aram BeechamCynthia will send over orders to be signed by Dr. Leveda AnnaHensel.  This is for Foley cathter care and wound care on her legs.  Milas Gain. Fleeger, Maryjo RochesterJessica Dawn, CMA

## 2017-02-26 ENCOUNTER — Other Ambulatory Visit: Payer: Self-pay | Admitting: *Deleted

## 2017-02-26 ENCOUNTER — Encounter: Payer: Self-pay | Admitting: *Deleted

## 2017-02-26 NOTE — Patient Outreach (Addendum)
[facility]) Care Management  02/26/2017  Francella SolianSharron Vanallen 12/24/1955 [id number]   Case Closure   Patient discharged from [facility] on 10/19 with [facility].  [facility] to verify patient is now under Hospice services now instead of home care as prior to admission , [facility] representative that patient has been under Hospice service since 02/22/17.  Placed call to patient , no answer able to leave a HIPAA compliant message.   Plan Will close case per workflow protocol, will notify Reece LevyJanet Caldwell, LCSW   Egbert GaribaldiKimberly Kilah Drahos, RN, [facility] Care Management,Care Management Coordinator  [phone]- Mobile [phone]- Toll Free Main Office

## 2017-02-28 ENCOUNTER — Encounter: Payer: Self-pay | Admitting: *Deleted

## 2017-02-28 DIAGNOSIS — G35 Multiple sclerosis: Secondary | ICD-10-CM | POA: Diagnosis not present

## 2017-02-28 DIAGNOSIS — R531 Weakness: Secondary | ICD-10-CM | POA: Diagnosis not present

## 2017-02-28 DIAGNOSIS — E119 Type 2 diabetes mellitus without complications: Secondary | ICD-10-CM | POA: Diagnosis not present

## 2017-02-28 DIAGNOSIS — M4802 Spinal stenosis, cervical region: Secondary | ICD-10-CM | POA: Diagnosis not present

## 2017-03-02 ENCOUNTER — Telehealth: Payer: Self-pay | Admitting: Student

## 2017-03-02 NOTE — Telephone Encounter (Signed)
Late entry from 03/01/2017 at about 6 pm.  FM after hour call response: Called back on [phone] and talked to someone who identified her name as Norval Morton. She says she is Mrs. Thosmpson nurse. She states that Mrs. Vandepol is feeling short of breath, wheezy with poor air movements. She states that her leg is at baseline. She denies fever. BP 134/84. Oxygen saturation 86% on room air. She is asking for something to help with shortness of breath. Although it is difficult to assess over the phone. I recommended oral Lasix 40 mg daily for 4 days provided SBP remains greater than 110 and DBP remains greater than 60. I also apprised albuterol inhaler 2 puffs every 4 hours as needed. Suggested calling the clinic over the weekdays for further recommendation from the PCP.

## 2017-03-03 NOTE — Addendum Note (Signed)
Addended by: Marlow Baars on: 03/03/2017 02:28 PM   Modules accepted: Level of Service, SmartSet

## 2017-03-03 NOTE — Progress Notes (Signed)
This encounter was created in error - please disregard.

## 2017-03-06 ENCOUNTER — Other Ambulatory Visit: Payer: Self-pay | Admitting: *Deleted

## 2017-03-10 ENCOUNTER — Telehealth: Payer: Self-pay | Admitting: Family Medicine

## 2017-03-10 NOTE — Telephone Encounter (Signed)
Death certificate left in PCP box.

## 2017-03-11 NOTE — Telephone Encounter (Signed)
Death Certificat completed

## 2017-03-13 ENCOUNTER — Telehealth: Payer: Self-pay | Admitting: Family Medicine

## 2017-03-13 NOTE — Telephone Encounter (Signed)
Death certificate needs to be redone because there was a strike through on the record.  I placed it in your box, thanks. Barbara [name]

## 2017-03-13 NOTE — Telephone Encounter (Signed)
[place].

## 2017-04-05 DEATH — deceased

## 2019-03-01 IMAGING — CT CT ABD-PELV W/ CM
3 of 8 series · 11 of 46 positions shown, 18 images · IV contrast (iopamidol)
Comparison: 07/16/2016

CLINICAL DATA: Abdominal pain.  History of diabetes.

EXAM:
CT ABDOMEN AND PELVIS WITH CONTRAST
TECHNIQUE: Multidetector CT imaging of the abdomen and pelvis was performed
using the standard protocol following bolus administration of
intravenous contrast.
CONTRAST:  100mL 4VWU1S-TCC IOPAMIDOL (4VWU1S-TCC) INJECTION 61%

[Series 4: abd/ pelvis 5.0 i30f 2 · axial · 0.93mm/px · z∈[+1024,+1359]mm · 7 of 91 slices shown, 12 images]
[im 12/91  soft-tissue]
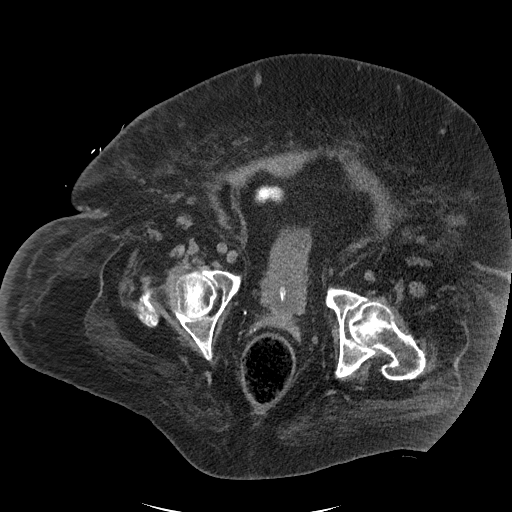
[im 12/91  bone]
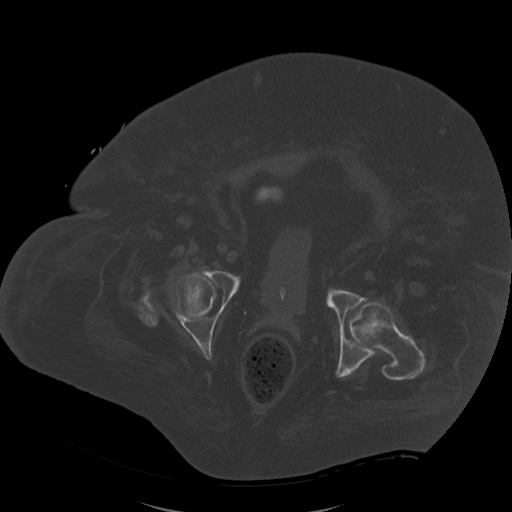
[im 23/91  soft-tissue]
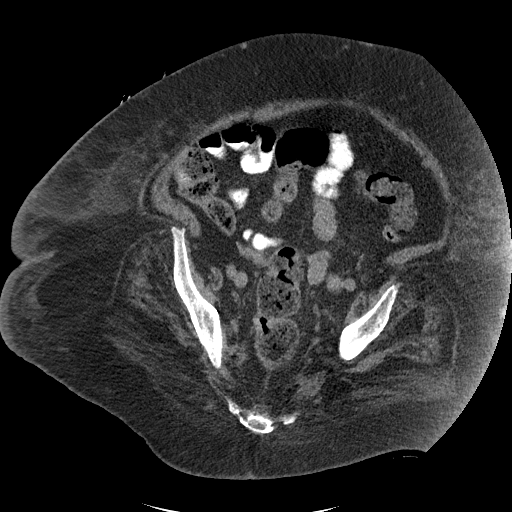
[im 34/91  soft-tissue]
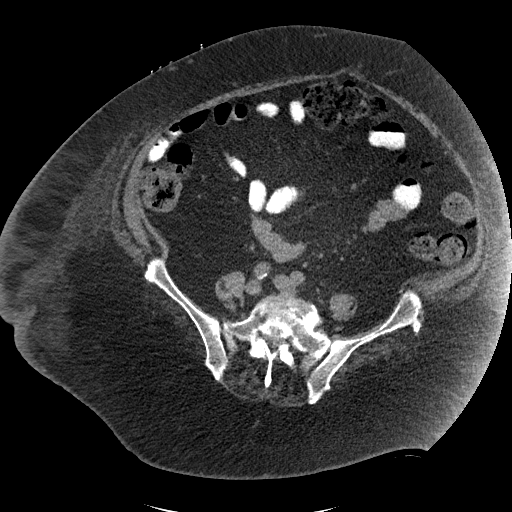
[im 46/91  soft-tissue]
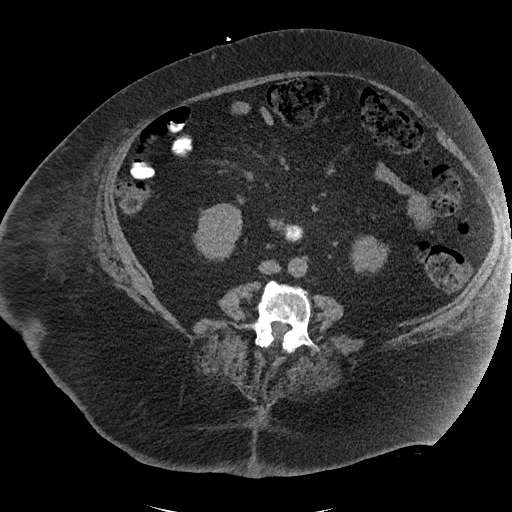
[im 46/91  lung]
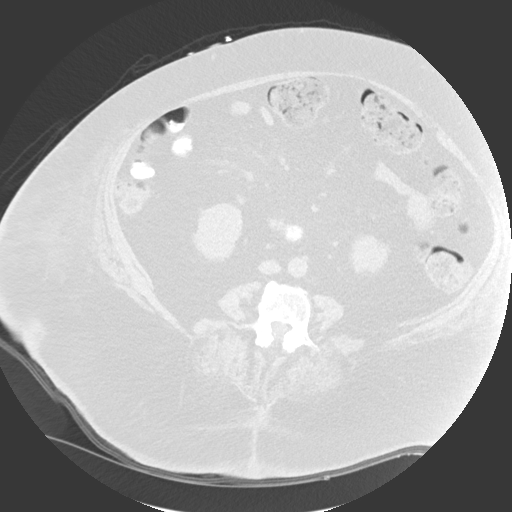
[im 57/91  soft-tissue]
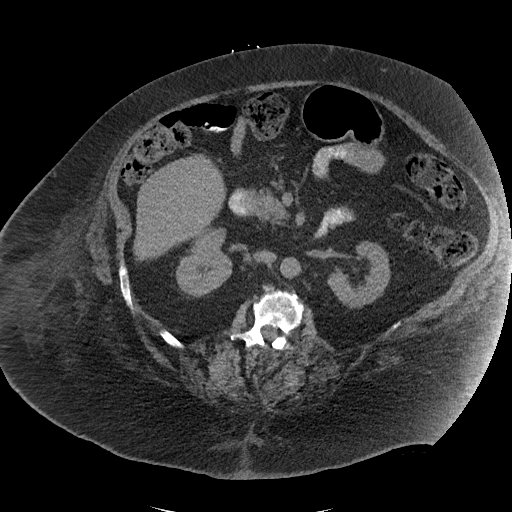
[im 57/91  lung]
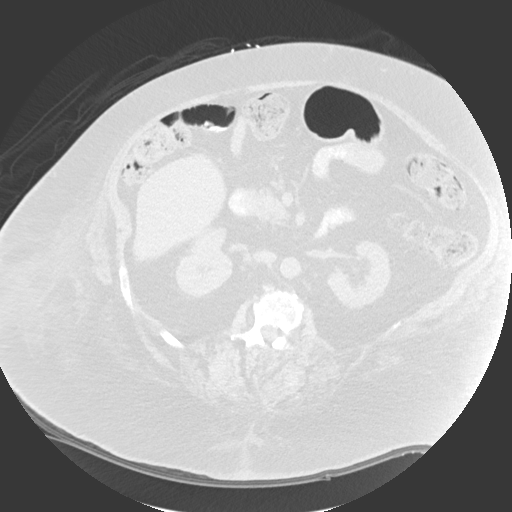
[im 68/91  soft-tissue]
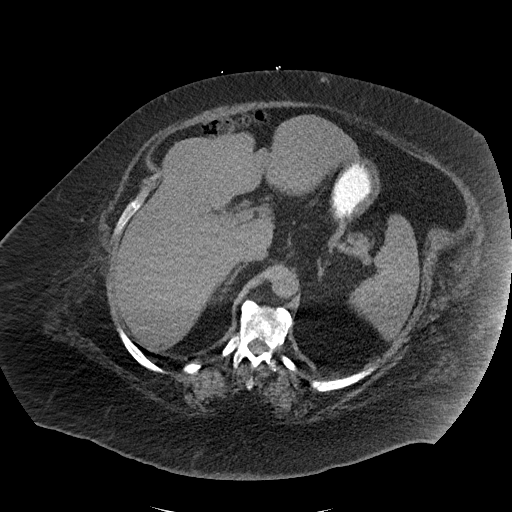
[im 68/91  lung]
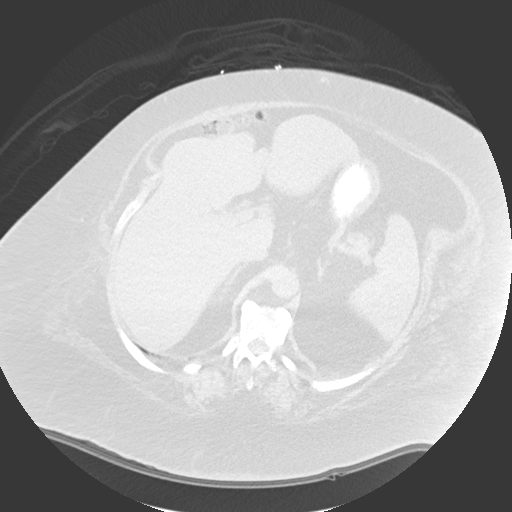
[im 79/91  soft-tissue]
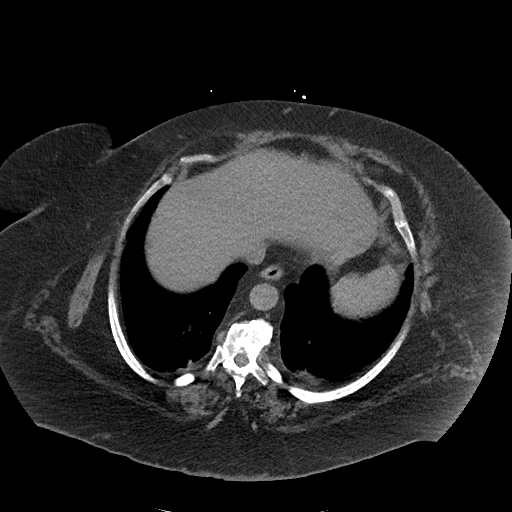
[im 79/91  lung]
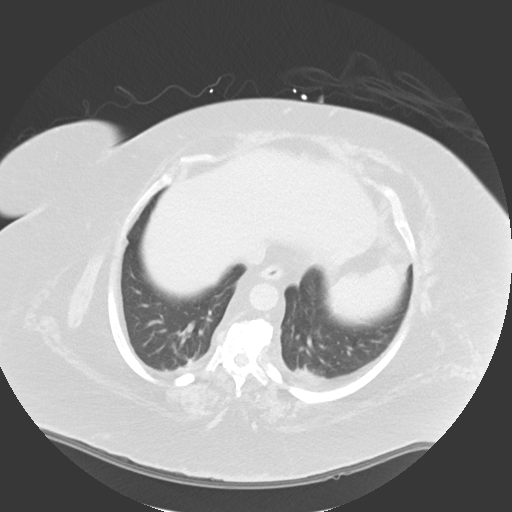

[Series 12: coronal soft tissue · coronal · 0.88mm/px · 3 of 127 slices shown, 4 images]
[im 43/127  soft-tissue]
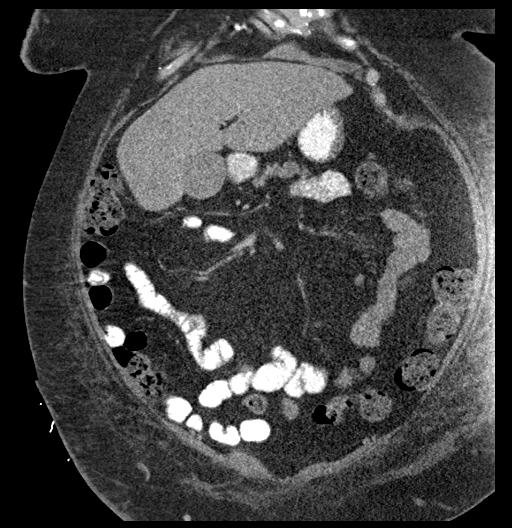
[im 57/127  soft-tissue]
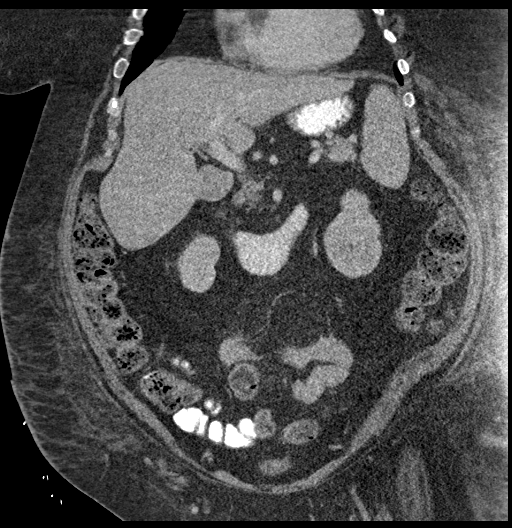
[im 57/127  bone]
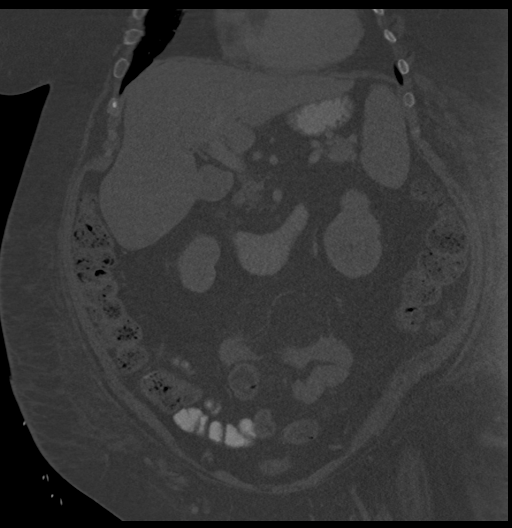
[im 71/127  soft-tissue]
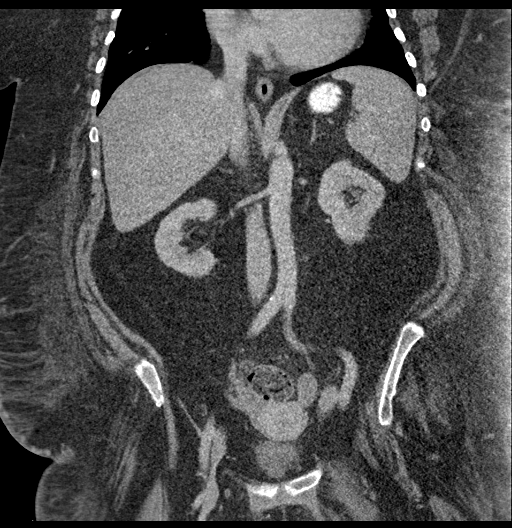

[Series 13: sagittal soft tissue · sagittal · 0.77mm/px · 1 of 146 slices shown, 2 images]
[im 49/146  soft-tissue]
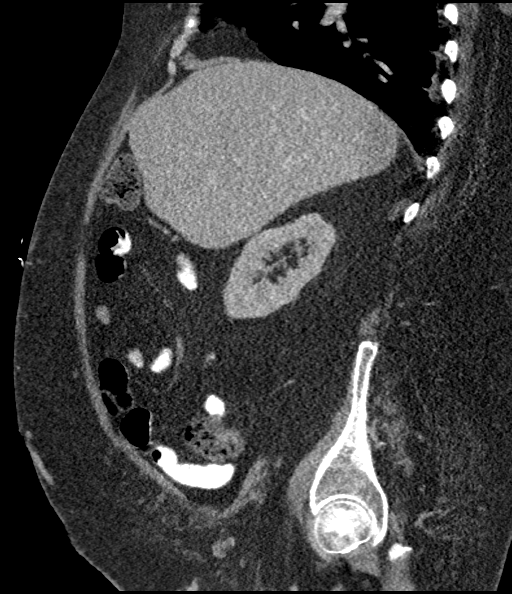
[im 49/146  bone]
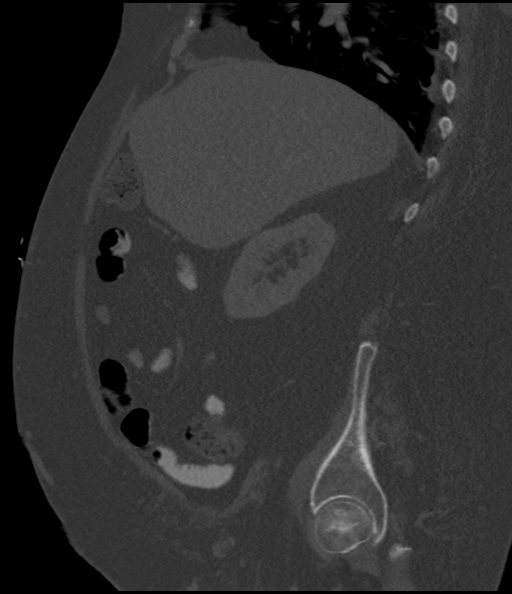

[11 of 46 positions shown; findings below may reference images not displayed]

FINDINGS: Lower chest: Atelectasis in the lung bases.

Hepatobiliary: Gas containing stones in the gallbladder. No
gallbladder wall thickening or infiltration. Mild diffuse fatty
infiltration of the liver. No focal liver lesions. No bile duct
dilatation.

Pancreas: Unremarkable. No pancreatic ductal dilatation or
surrounding inflammatory changes.

Spleen: Normal in size without focal abnormality.

Adrenals/Urinary Tract: No adrenal gland nodules. Kidneys are
symmetrical. Renal nephrograms are homogeneous. No hydronephrosis or
hydroureter. Bladder is decompressed with a Foley catheter.

Stomach/Bowel: Stomach, small bowel, and colon are not abnormally
distended. Contrast material flows through to the colon without
evidence of bowel obstruction. Diffusely stool-filled colon. No
inflammatory changes. Appendix is not identified.

Vascular/Lymphatic: Aortic atherosclerosis. No enlarged abdominal or
pelvic lymph nodes.

Reproductive: Uterus and bilateral adnexa are unremarkable.

Other: Infiltration in the subcutaneous fat likely due to edema.
Prominent adipose tissues. No free air or free fluid in the abdomen.

Musculoskeletal: Degenerative changes in the spine. Wall no
destructive bone lesions.
IMPRESSION: Cholelithiasis. Foley catheter in the bladder. No evidence of bowel
obstruction or inflammation. Soft tissue edema in the subcutaneous
fat.
